# Patient Record
Sex: Female | Born: 1965 | Race: Black or African American | Hispanic: No | Marital: Married | Smoking: Former smoker
Health system: Southern US, Community
[De-identification: ages and names within clinical notes are randomized; demographics above are authoritative.]

## PROBLEM LIST (undated history)

## (undated) ENCOUNTER — Emergency Department (HOSPITAL_BASED_OUTPATIENT_CLINIC_OR_DEPARTMENT_OTHER): Admission: EM | Payer: Self-pay | Source: Home / Self Care

## (undated) ENCOUNTER — Emergency Department (HOSPITAL_BASED_OUTPATIENT_CLINIC_OR_DEPARTMENT_OTHER): Payer: Self-pay | Source: Home / Self Care

## (undated) DIAGNOSIS — R42 Dizziness and giddiness: Secondary | ICD-10-CM

## (undated) DIAGNOSIS — N200 Calculus of kidney: Secondary | ICD-10-CM

## (undated) DIAGNOSIS — E785 Hyperlipidemia, unspecified: Secondary | ICD-10-CM

## (undated) DIAGNOSIS — K219 Gastro-esophageal reflux disease without esophagitis: Secondary | ICD-10-CM

## (undated) DIAGNOSIS — J45909 Unspecified asthma, uncomplicated: Secondary | ICD-10-CM

## (undated) DIAGNOSIS — I10 Essential (primary) hypertension: Secondary | ICD-10-CM

## (undated) DIAGNOSIS — R918 Other nonspecific abnormal finding of lung field: Secondary | ICD-10-CM

## (undated) HISTORY — DX: Hyperlipidemia, unspecified: E78.5

## (undated) HISTORY — PX: TUBAL LIGATION: SHX77

## (undated) HISTORY — PX: MANDIBLE SURGERY: SHX707

---

## 1999-05-29 ENCOUNTER — Ambulatory Visit (HOSPITAL_COMMUNITY): Admission: RE | Admit: 1999-05-29 | Discharge: 1999-05-29 | Payer: Self-pay | Admitting: *Deleted

## 1999-05-29 ENCOUNTER — Encounter: Payer: Self-pay | Admitting: *Deleted

## 1999-08-06 ENCOUNTER — Emergency Department (HOSPITAL_COMMUNITY): Admission: EM | Admit: 1999-08-06 | Discharge: 1999-08-06 | Payer: Self-pay

## 1999-08-24 ENCOUNTER — Emergency Department (HOSPITAL_COMMUNITY): Admission: EM | Admit: 1999-08-24 | Discharge: 1999-08-25 | Payer: Self-pay | Admitting: Emergency Medicine

## 1999-10-20 ENCOUNTER — Encounter: Payer: Self-pay | Admitting: Emergency Medicine

## 1999-10-20 ENCOUNTER — Emergency Department (HOSPITAL_COMMUNITY): Admission: EM | Admit: 1999-10-20 | Discharge: 1999-10-20 | Payer: Self-pay | Admitting: Emergency Medicine

## 2000-04-10 ENCOUNTER — Emergency Department (HOSPITAL_COMMUNITY): Admission: EM | Admit: 2000-04-10 | Discharge: 2000-04-10 | Payer: Self-pay

## 2000-05-18 ENCOUNTER — Emergency Department (HOSPITAL_COMMUNITY): Admission: EM | Admit: 2000-05-18 | Discharge: 2000-05-18 | Payer: Self-pay | Admitting: Emergency Medicine

## 2000-11-07 ENCOUNTER — Emergency Department (HOSPITAL_COMMUNITY): Admission: EM | Admit: 2000-11-07 | Discharge: 2000-11-07 | Payer: Self-pay | Admitting: Internal Medicine

## 2000-11-26 ENCOUNTER — Other Ambulatory Visit (HOSPITAL_COMMUNITY): Admission: RE | Admit: 2000-11-26 | Discharge: 2000-12-03 | Payer: Self-pay | Admitting: Psychiatry

## 2001-02-14 ENCOUNTER — Emergency Department (HOSPITAL_COMMUNITY): Admission: EM | Admit: 2001-02-14 | Discharge: 2001-02-15 | Payer: Self-pay | Admitting: *Deleted

## 2001-02-15 ENCOUNTER — Encounter: Payer: Self-pay | Admitting: Emergency Medicine

## 2001-02-15 ENCOUNTER — Encounter: Payer: Self-pay | Admitting: *Deleted

## 2001-07-21 ENCOUNTER — Emergency Department (HOSPITAL_COMMUNITY): Admission: EM | Admit: 2001-07-21 | Discharge: 2001-07-21 | Payer: Self-pay | Admitting: Emergency Medicine

## 2001-07-21 ENCOUNTER — Encounter: Payer: Self-pay | Admitting: Emergency Medicine

## 2001-09-28 ENCOUNTER — Emergency Department (HOSPITAL_COMMUNITY): Admission: EM | Admit: 2001-09-28 | Discharge: 2001-09-28 | Payer: Self-pay | Admitting: Emergency Medicine

## 2001-09-28 ENCOUNTER — Encounter: Payer: Self-pay | Admitting: Emergency Medicine

## 2001-12-06 ENCOUNTER — Encounter: Payer: Self-pay | Admitting: Emergency Medicine

## 2001-12-06 ENCOUNTER — Emergency Department (HOSPITAL_COMMUNITY): Admission: EM | Admit: 2001-12-06 | Discharge: 2001-12-06 | Payer: Self-pay | Admitting: Emergency Medicine

## 2001-12-19 ENCOUNTER — Encounter: Payer: Self-pay | Admitting: Emergency Medicine

## 2001-12-19 ENCOUNTER — Emergency Department (HOSPITAL_COMMUNITY): Admission: EM | Admit: 2001-12-19 | Discharge: 2001-12-19 | Payer: Self-pay | Admitting: Emergency Medicine

## 2001-12-24 ENCOUNTER — Encounter: Admission: RE | Admit: 2001-12-24 | Discharge: 2001-12-24 | Payer: Self-pay | Admitting: Family Medicine

## 2001-12-24 ENCOUNTER — Encounter: Payer: Self-pay | Admitting: Family Medicine

## 2001-12-27 ENCOUNTER — Encounter: Payer: Self-pay | Admitting: Family Medicine

## 2001-12-27 ENCOUNTER — Encounter: Admission: RE | Admit: 2001-12-27 | Discharge: 2001-12-27 | Payer: Self-pay | Admitting: Family Medicine

## 2002-02-10 ENCOUNTER — Emergency Department (HOSPITAL_COMMUNITY): Admission: EM | Admit: 2002-02-10 | Discharge: 2002-02-11 | Payer: Self-pay | Admitting: Emergency Medicine

## 2002-02-10 ENCOUNTER — Encounter: Payer: Self-pay | Admitting: Emergency Medicine

## 2002-02-24 ENCOUNTER — Other Ambulatory Visit: Admission: RE | Admit: 2002-02-24 | Discharge: 2002-02-24 | Payer: Self-pay | Admitting: Family Medicine

## 2002-06-11 ENCOUNTER — Encounter: Admission: RE | Admit: 2002-06-11 | Discharge: 2002-06-11 | Payer: Self-pay | Admitting: Family Medicine

## 2002-06-11 ENCOUNTER — Encounter: Payer: Self-pay | Admitting: Family Medicine

## 2002-12-13 ENCOUNTER — Emergency Department (HOSPITAL_COMMUNITY): Admission: EM | Admit: 2002-12-13 | Discharge: 2002-12-13 | Payer: Self-pay | Admitting: Emergency Medicine

## 2002-12-27 ENCOUNTER — Emergency Department (HOSPITAL_COMMUNITY): Admission: EM | Admit: 2002-12-27 | Discharge: 2002-12-27 | Payer: Self-pay | Admitting: Emergency Medicine

## 2003-01-29 ENCOUNTER — Emergency Department (HOSPITAL_COMMUNITY): Admission: EM | Admit: 2003-01-29 | Discharge: 2003-01-29 | Payer: Self-pay | Admitting: Emergency Medicine

## 2003-01-29 ENCOUNTER — Encounter: Payer: Self-pay | Admitting: Emergency Medicine

## 2003-02-08 ENCOUNTER — Emergency Department (HOSPITAL_COMMUNITY): Admission: EM | Admit: 2003-02-08 | Discharge: 2003-02-08 | Payer: Self-pay | Admitting: Emergency Medicine

## 2003-02-17 ENCOUNTER — Emergency Department (HOSPITAL_COMMUNITY): Admission: EM | Admit: 2003-02-17 | Discharge: 2003-02-17 | Payer: Self-pay | Admitting: Emergency Medicine

## 2003-03-03 ENCOUNTER — Encounter: Admission: RE | Admit: 2003-03-03 | Discharge: 2003-03-31 | Payer: Self-pay

## 2003-03-09 ENCOUNTER — Encounter: Payer: Self-pay | Admitting: Emergency Medicine

## 2003-03-09 ENCOUNTER — Emergency Department (HOSPITAL_COMMUNITY): Admission: EM | Admit: 2003-03-09 | Discharge: 2003-03-09 | Payer: Self-pay | Admitting: Emergency Medicine

## 2003-03-11 ENCOUNTER — Encounter: Payer: Self-pay | Admitting: Emergency Medicine

## 2003-03-11 ENCOUNTER — Inpatient Hospital Stay (HOSPITAL_COMMUNITY): Admission: EM | Admit: 2003-03-11 | Discharge: 2003-03-14 | Payer: Self-pay | Admitting: Emergency Medicine

## 2003-03-12 ENCOUNTER — Encounter: Payer: Self-pay | Admitting: Family Medicine

## 2003-03-14 ENCOUNTER — Inpatient Hospital Stay (HOSPITAL_COMMUNITY): Admission: AD | Admit: 2003-03-14 | Discharge: 2003-03-16 | Payer: Self-pay

## 2003-03-16 ENCOUNTER — Encounter: Payer: Self-pay | Admitting: Family Medicine

## 2003-04-07 ENCOUNTER — Ambulatory Visit (HOSPITAL_BASED_OUTPATIENT_CLINIC_OR_DEPARTMENT_OTHER): Admission: RE | Admit: 2003-04-07 | Discharge: 2003-04-07 | Payer: Self-pay | Admitting: Urology

## 2003-04-09 ENCOUNTER — Emergency Department (HOSPITAL_COMMUNITY): Admission: EM | Admit: 2003-04-09 | Discharge: 2003-04-09 | Payer: Self-pay | Admitting: Emergency Medicine

## 2003-04-09 ENCOUNTER — Encounter: Payer: Self-pay | Admitting: Emergency Medicine

## 2003-04-13 ENCOUNTER — Other Ambulatory Visit: Admission: RE | Admit: 2003-04-13 | Discharge: 2003-04-13 | Payer: Self-pay | Admitting: Obstetrics and Gynecology

## 2003-05-03 ENCOUNTER — Emergency Department (HOSPITAL_COMMUNITY): Admission: EM | Admit: 2003-05-03 | Discharge: 2003-05-03 | Payer: Self-pay | Admitting: Emergency Medicine

## 2003-05-14 ENCOUNTER — Encounter: Payer: Self-pay | Admitting: Emergency Medicine

## 2003-05-14 ENCOUNTER — Emergency Department (HOSPITAL_COMMUNITY): Admission: EM | Admit: 2003-05-14 | Discharge: 2003-05-15 | Payer: Self-pay | Admitting: Emergency Medicine

## 2003-05-18 ENCOUNTER — Ambulatory Visit (HOSPITAL_COMMUNITY): Admission: RE | Admit: 2003-05-18 | Discharge: 2003-05-18 | Payer: Self-pay | Admitting: Gastroenterology

## 2003-06-11 ENCOUNTER — Emergency Department (HOSPITAL_COMMUNITY): Admission: EM | Admit: 2003-06-11 | Discharge: 2003-06-11 | Payer: Self-pay | Admitting: Emergency Medicine

## 2003-06-13 ENCOUNTER — Emergency Department (HOSPITAL_COMMUNITY): Admission: EM | Admit: 2003-06-13 | Discharge: 2003-06-14 | Payer: Self-pay | Admitting: Emergency Medicine

## 2003-07-20 ENCOUNTER — Emergency Department (HOSPITAL_COMMUNITY): Admission: EM | Admit: 2003-07-20 | Discharge: 2003-07-21 | Payer: Self-pay | Admitting: Emergency Medicine

## 2003-08-05 ENCOUNTER — Ambulatory Visit (HOSPITAL_COMMUNITY): Admission: RE | Admit: 2003-08-05 | Discharge: 2003-08-05 | Payer: Self-pay | Admitting: Orthopaedic Surgery

## 2003-08-10 ENCOUNTER — Emergency Department (HOSPITAL_COMMUNITY): Admission: EM | Admit: 2003-08-10 | Discharge: 2003-08-10 | Payer: Self-pay | Admitting: Emergency Medicine

## 2003-08-25 ENCOUNTER — Emergency Department (HOSPITAL_COMMUNITY): Admission: EM | Admit: 2003-08-25 | Discharge: 2003-08-25 | Payer: Self-pay | Admitting: Emergency Medicine

## 2003-08-28 ENCOUNTER — Inpatient Hospital Stay (HOSPITAL_COMMUNITY): Admission: AD | Admit: 2003-08-28 | Discharge: 2003-08-31 | Payer: Self-pay | Admitting: Neurology

## 2003-12-14 ENCOUNTER — Other Ambulatory Visit: Admission: RE | Admit: 2003-12-14 | Discharge: 2003-12-14 | Payer: Self-pay | Admitting: Family Medicine

## 2003-12-22 ENCOUNTER — Encounter: Admission: RE | Admit: 2003-12-22 | Discharge: 2003-12-22 | Payer: Self-pay | Admitting: Family Medicine

## 2003-12-28 ENCOUNTER — Other Ambulatory Visit: Admission: RE | Admit: 2003-12-28 | Discharge: 2003-12-28 | Payer: Self-pay | Admitting: Obstetrics and Gynecology

## 2004-02-27 ENCOUNTER — Emergency Department (HOSPITAL_COMMUNITY): Admission: EM | Admit: 2004-02-27 | Discharge: 2004-02-27 | Payer: Self-pay | Admitting: Emergency Medicine

## 2004-04-20 ENCOUNTER — Emergency Department (HOSPITAL_COMMUNITY): Admission: EM | Admit: 2004-04-20 | Discharge: 2004-04-20 | Payer: Self-pay | Admitting: Emergency Medicine

## 2004-04-25 ENCOUNTER — Ambulatory Visit (HOSPITAL_COMMUNITY): Admission: RE | Admit: 2004-04-25 | Discharge: 2004-04-25 | Payer: Self-pay | Admitting: Orthopaedic Surgery

## 2004-04-27 ENCOUNTER — Encounter: Admission: RE | Admit: 2004-04-27 | Discharge: 2004-07-26 | Payer: Self-pay | Admitting: Orthopaedic Surgery

## 2004-05-09 ENCOUNTER — Ambulatory Visit (HOSPITAL_COMMUNITY): Admission: RE | Admit: 2004-05-09 | Discharge: 2004-05-09 | Payer: Self-pay | Admitting: Obstetrics and Gynecology

## 2004-05-09 ENCOUNTER — Encounter (INDEPENDENT_AMBULATORY_CARE_PROVIDER_SITE_OTHER): Payer: Self-pay | Admitting: Specialist

## 2004-06-10 ENCOUNTER — Emergency Department (HOSPITAL_COMMUNITY): Admission: EM | Admit: 2004-06-10 | Discharge: 2004-06-10 | Payer: Self-pay | Admitting: Emergency Medicine

## 2004-07-27 ENCOUNTER — Emergency Department (HOSPITAL_COMMUNITY): Admission: EM | Admit: 2004-07-27 | Discharge: 2004-07-27 | Payer: Self-pay | Admitting: Emergency Medicine

## 2004-08-30 ENCOUNTER — Other Ambulatory Visit: Admission: RE | Admit: 2004-08-30 | Discharge: 2004-08-30 | Payer: Self-pay | Admitting: Obstetrics and Gynecology

## 2004-09-07 ENCOUNTER — Emergency Department (HOSPITAL_COMMUNITY): Admission: EM | Admit: 2004-09-07 | Discharge: 2004-09-08 | Payer: Self-pay | Admitting: Emergency Medicine

## 2004-10-07 ENCOUNTER — Emergency Department (HOSPITAL_COMMUNITY): Admission: EM | Admit: 2004-10-07 | Discharge: 2004-10-07 | Payer: Self-pay | Admitting: Emergency Medicine

## 2004-11-23 ENCOUNTER — Emergency Department (HOSPITAL_COMMUNITY): Admission: EM | Admit: 2004-11-23 | Discharge: 2004-11-23 | Payer: Self-pay | Admitting: Emergency Medicine

## 2005-01-21 ENCOUNTER — Emergency Department (HOSPITAL_COMMUNITY): Admission: EM | Admit: 2005-01-21 | Discharge: 2005-01-21 | Payer: Self-pay | Admitting: Emergency Medicine

## 2005-02-24 ENCOUNTER — Emergency Department (HOSPITAL_COMMUNITY): Admission: EM | Admit: 2005-02-24 | Discharge: 2005-02-25 | Payer: Self-pay | Admitting: Emergency Medicine

## 2005-05-07 ENCOUNTER — Emergency Department (HOSPITAL_COMMUNITY): Admission: EM | Admit: 2005-05-07 | Discharge: 2005-05-08 | Payer: Self-pay | Admitting: Emergency Medicine

## 2005-12-20 ENCOUNTER — Emergency Department (HOSPITAL_COMMUNITY): Admission: EM | Admit: 2005-12-20 | Discharge: 2005-12-20 | Payer: Self-pay | Admitting: Emergency Medicine

## 2006-02-26 ENCOUNTER — Emergency Department (HOSPITAL_COMMUNITY): Admission: EM | Admit: 2006-02-26 | Discharge: 2006-02-26 | Payer: Self-pay | Admitting: Emergency Medicine

## 2006-07-03 ENCOUNTER — Emergency Department (HOSPITAL_COMMUNITY): Admission: EM | Admit: 2006-07-03 | Discharge: 2006-07-04 | Payer: Self-pay | Admitting: Emergency Medicine

## 2006-08-27 ENCOUNTER — Emergency Department (HOSPITAL_COMMUNITY): Admission: EM | Admit: 2006-08-27 | Discharge: 2006-08-27 | Payer: Self-pay | Admitting: Emergency Medicine

## 2006-08-30 ENCOUNTER — Emergency Department (HOSPITAL_COMMUNITY): Admission: EM | Admit: 2006-08-30 | Discharge: 2006-08-30 | Payer: Self-pay | Admitting: Emergency Medicine

## 2006-10-09 ENCOUNTER — Emergency Department (HOSPITAL_COMMUNITY): Admission: EM | Admit: 2006-10-09 | Discharge: 2006-10-09 | Payer: Self-pay | Admitting: Emergency Medicine

## 2006-11-22 ENCOUNTER — Ambulatory Visit (HOSPITAL_COMMUNITY): Admission: RE | Admit: 2006-11-22 | Discharge: 2006-11-22 | Payer: Self-pay | Admitting: Gastroenterology

## 2007-01-06 ENCOUNTER — Emergency Department (HOSPITAL_COMMUNITY): Admission: EM | Admit: 2007-01-06 | Discharge: 2007-01-06 | Payer: Self-pay | Admitting: Emergency Medicine

## 2007-02-11 ENCOUNTER — Emergency Department (HOSPITAL_COMMUNITY): Admission: EM | Admit: 2007-02-11 | Discharge: 2007-02-11 | Payer: Self-pay | Admitting: Emergency Medicine

## 2007-04-03 ENCOUNTER — Emergency Department (HOSPITAL_COMMUNITY): Admission: EM | Admit: 2007-04-03 | Discharge: 2007-04-03 | Payer: Self-pay | Admitting: Emergency Medicine

## 2007-05-08 ENCOUNTER — Emergency Department (HOSPITAL_COMMUNITY): Admission: EM | Admit: 2007-05-08 | Discharge: 2007-05-08 | Payer: Self-pay | Admitting: Emergency Medicine

## 2007-12-08 ENCOUNTER — Encounter: Payer: Self-pay | Admitting: Emergency Medicine

## 2007-12-09 ENCOUNTER — Inpatient Hospital Stay (HOSPITAL_COMMUNITY): Admission: EM | Admit: 2007-12-09 | Discharge: 2007-12-10 | Payer: Self-pay | Admitting: Neurology

## 2007-12-09 ENCOUNTER — Encounter (INDEPENDENT_AMBULATORY_CARE_PROVIDER_SITE_OTHER): Payer: Self-pay | Admitting: Pediatrics

## 2008-02-27 ENCOUNTER — Emergency Department (HOSPITAL_COMMUNITY): Admission: EM | Admit: 2008-02-27 | Discharge: 2008-02-27 | Payer: Self-pay | Admitting: *Deleted

## 2008-05-28 ENCOUNTER — Emergency Department (HOSPITAL_COMMUNITY): Admission: EM | Admit: 2008-05-28 | Discharge: 2008-05-28 | Payer: Self-pay | Admitting: Emergency Medicine

## 2008-09-01 ENCOUNTER — Emergency Department (HOSPITAL_COMMUNITY): Admission: EM | Admit: 2008-09-01 | Discharge: 2008-09-01 | Payer: Self-pay | Admitting: Emergency Medicine

## 2008-09-24 ENCOUNTER — Emergency Department (HOSPITAL_COMMUNITY): Admission: EM | Admit: 2008-09-24 | Discharge: 2008-09-25 | Payer: Self-pay | Admitting: Emergency Medicine

## 2008-10-14 IMAGING — CR DG CHEST 2V
2 series · 2 of 2 positions shown · non-contrast
Comparison: CT chest and chest x-ray 12/20/05.

CLINICAL DATA: Shortness of breath and chest pain.  
 CHEST ? 2 VIEW ? 08/27/06:

[view not recorded (1 of 2)]
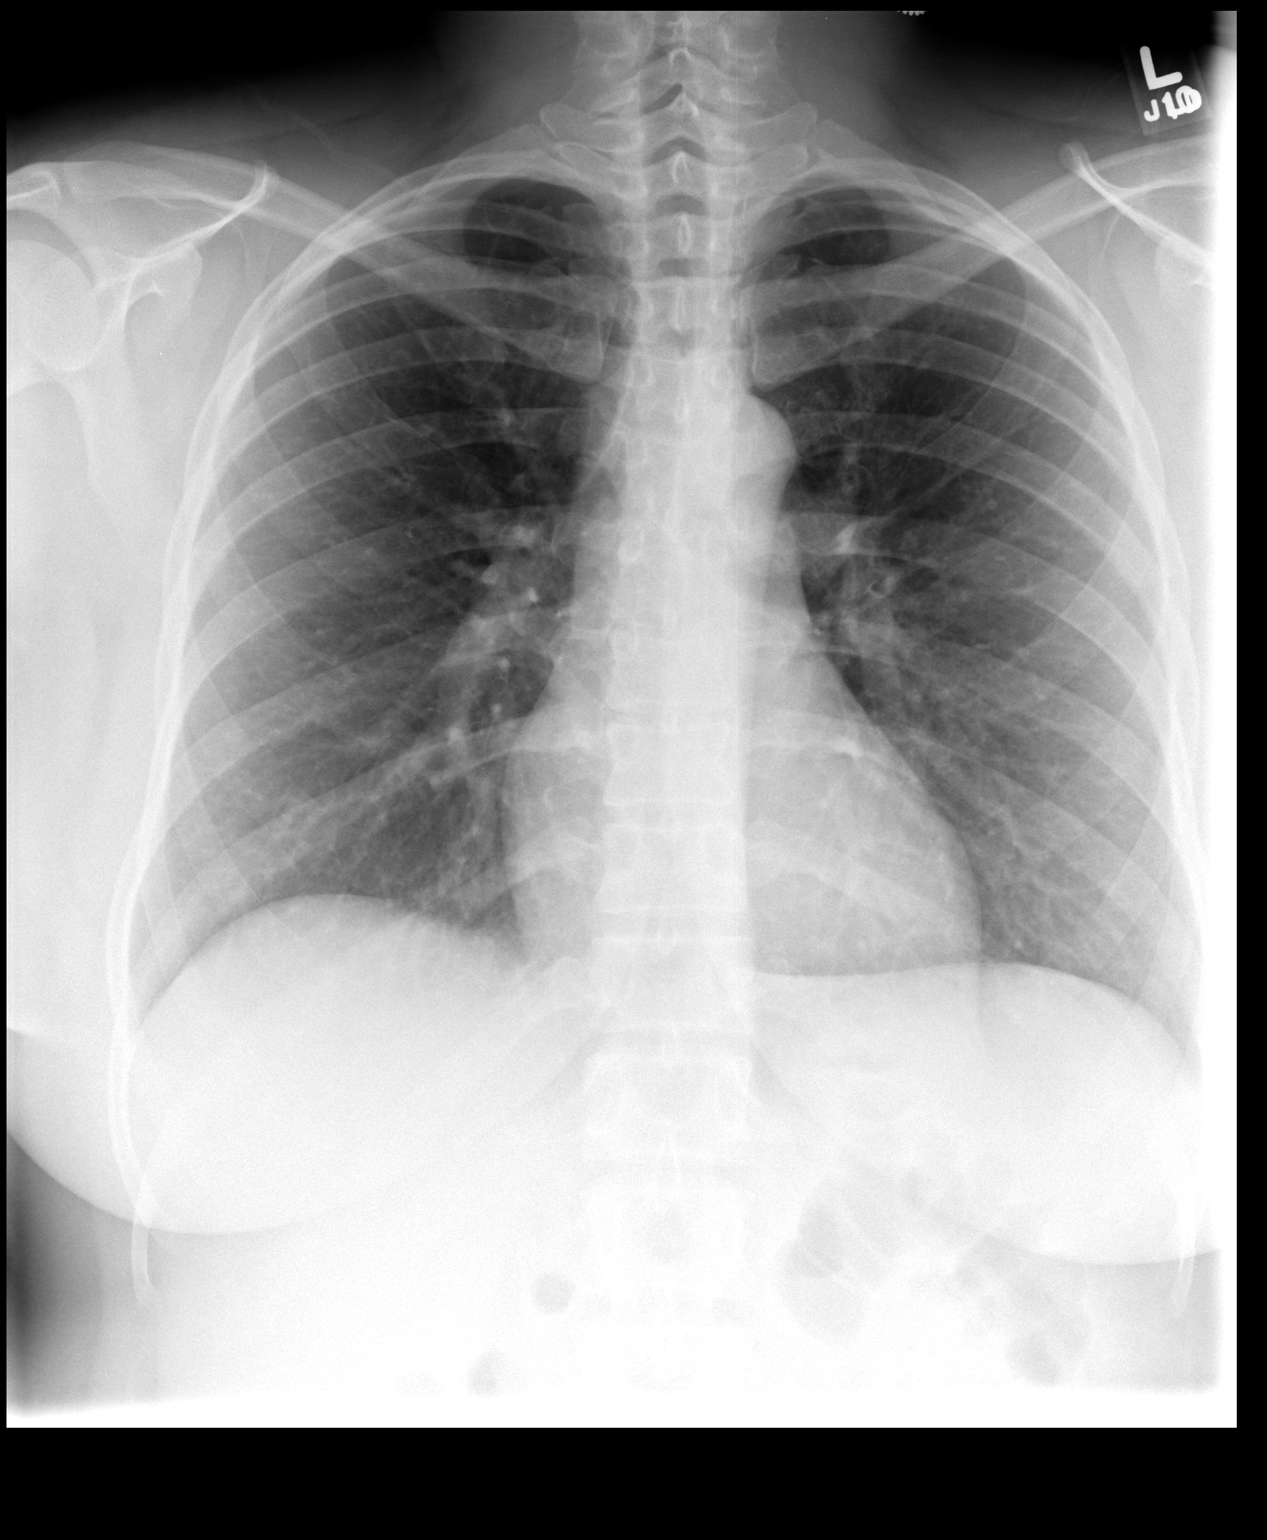

[view not recorded (2 of 2)]
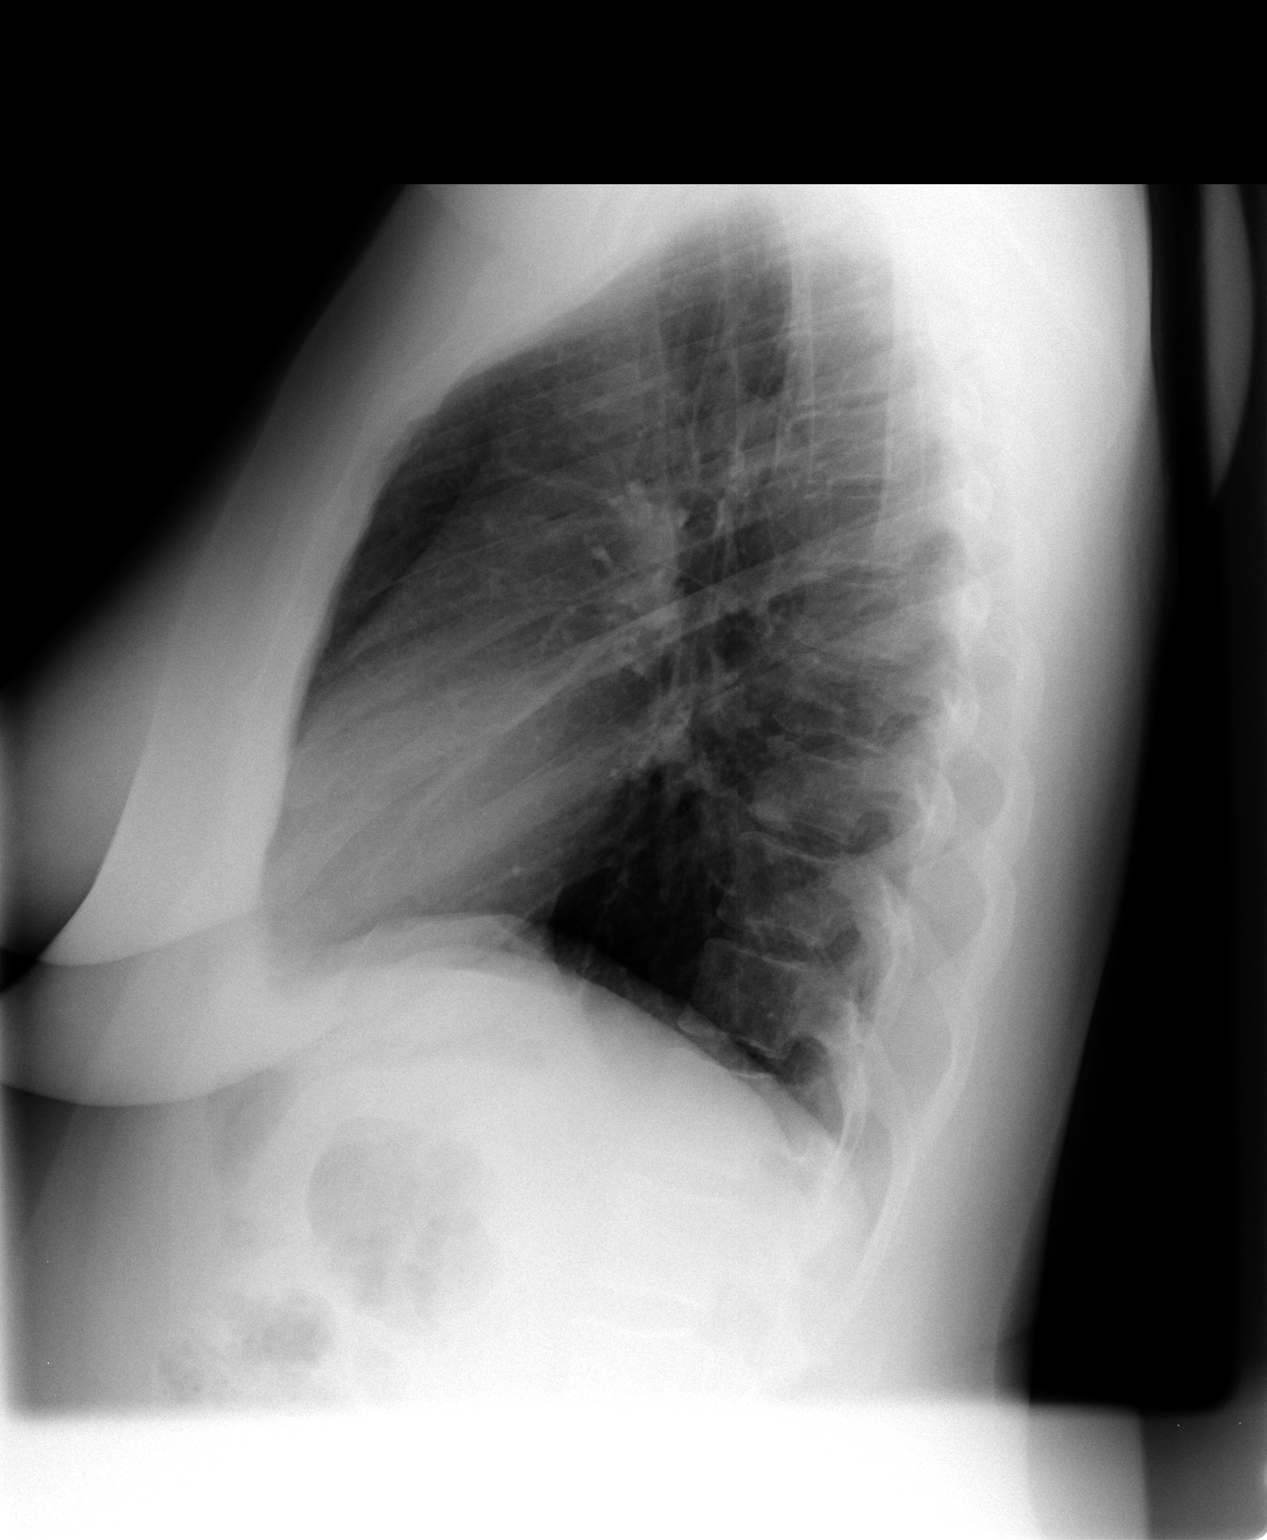

[2 of 2 positions shown; findings below may reference images not displayed]

FINDINGS: Trachea is midline.  Heart size is normal.  Lungs are clear.  No pleural fluid.
IMPRESSION: No acute findings.

## 2008-12-25 ENCOUNTER — Emergency Department (HOSPITAL_COMMUNITY): Admission: EM | Admit: 2008-12-25 | Discharge: 2008-12-25 | Payer: Self-pay | Admitting: *Deleted

## 2009-05-21 IMAGING — CR DG HIP COMPLETE 2+V*R*
3 series · 3 of 3 positions shown · non-contrast
Comparison: none

CLINICAL DATA: Fell one week ago. Soreness within the right hip.
 RIGHT HIP ? 3 VIEW:

[t pelvis a.p.]
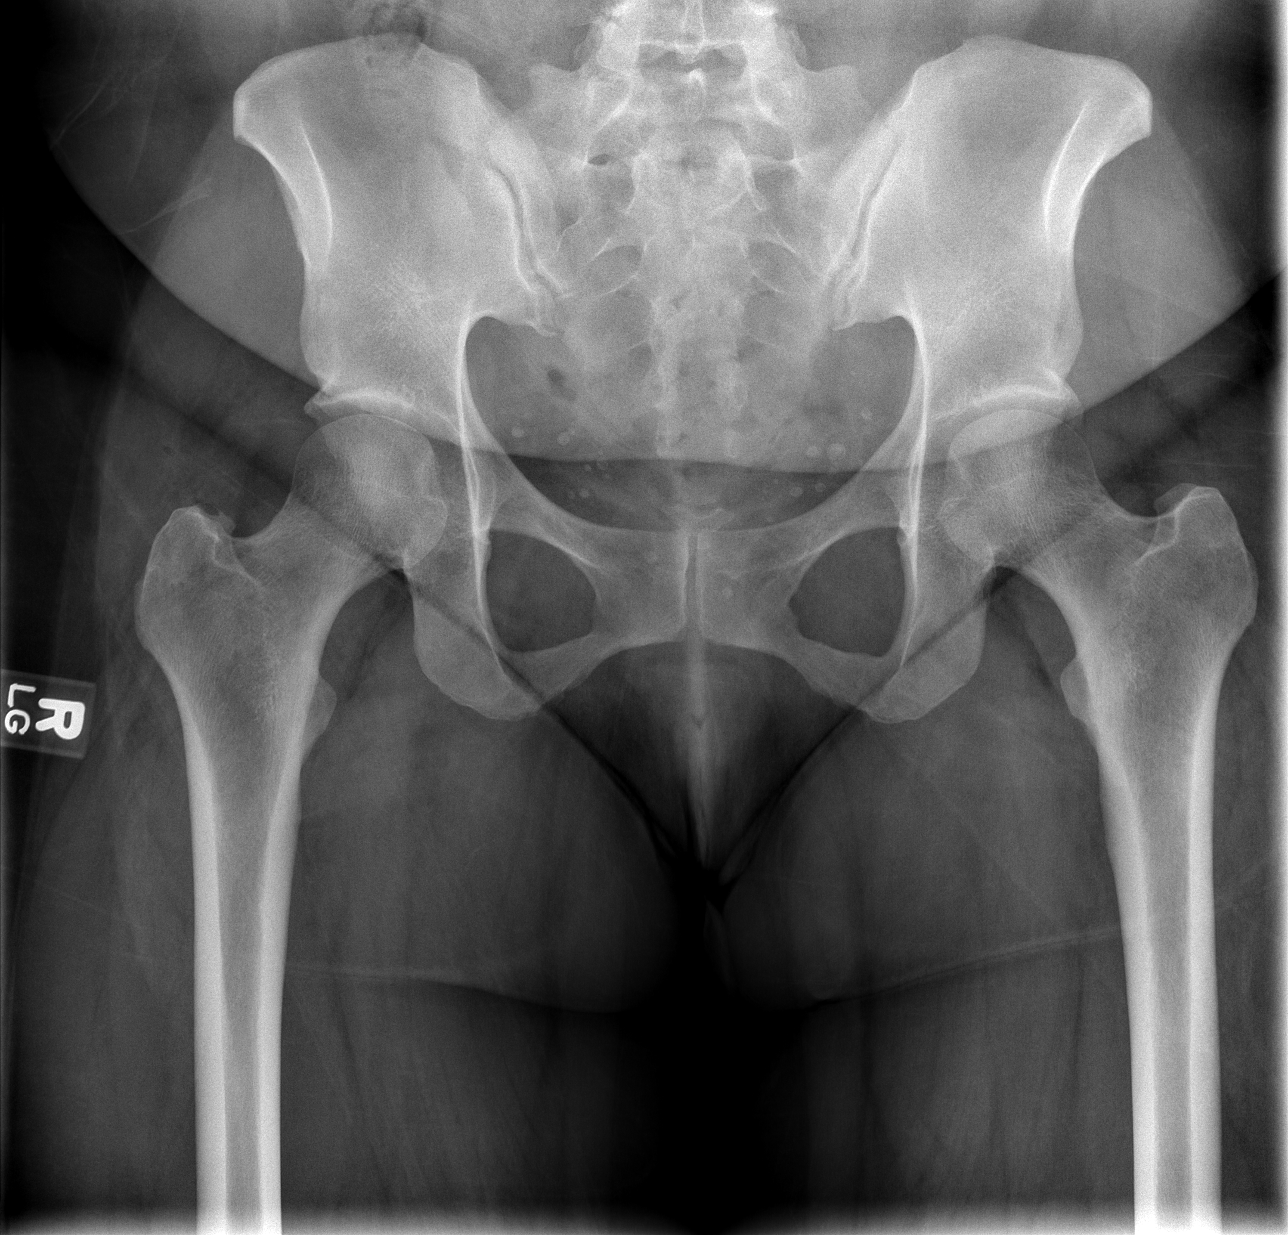

[t hip ap right]
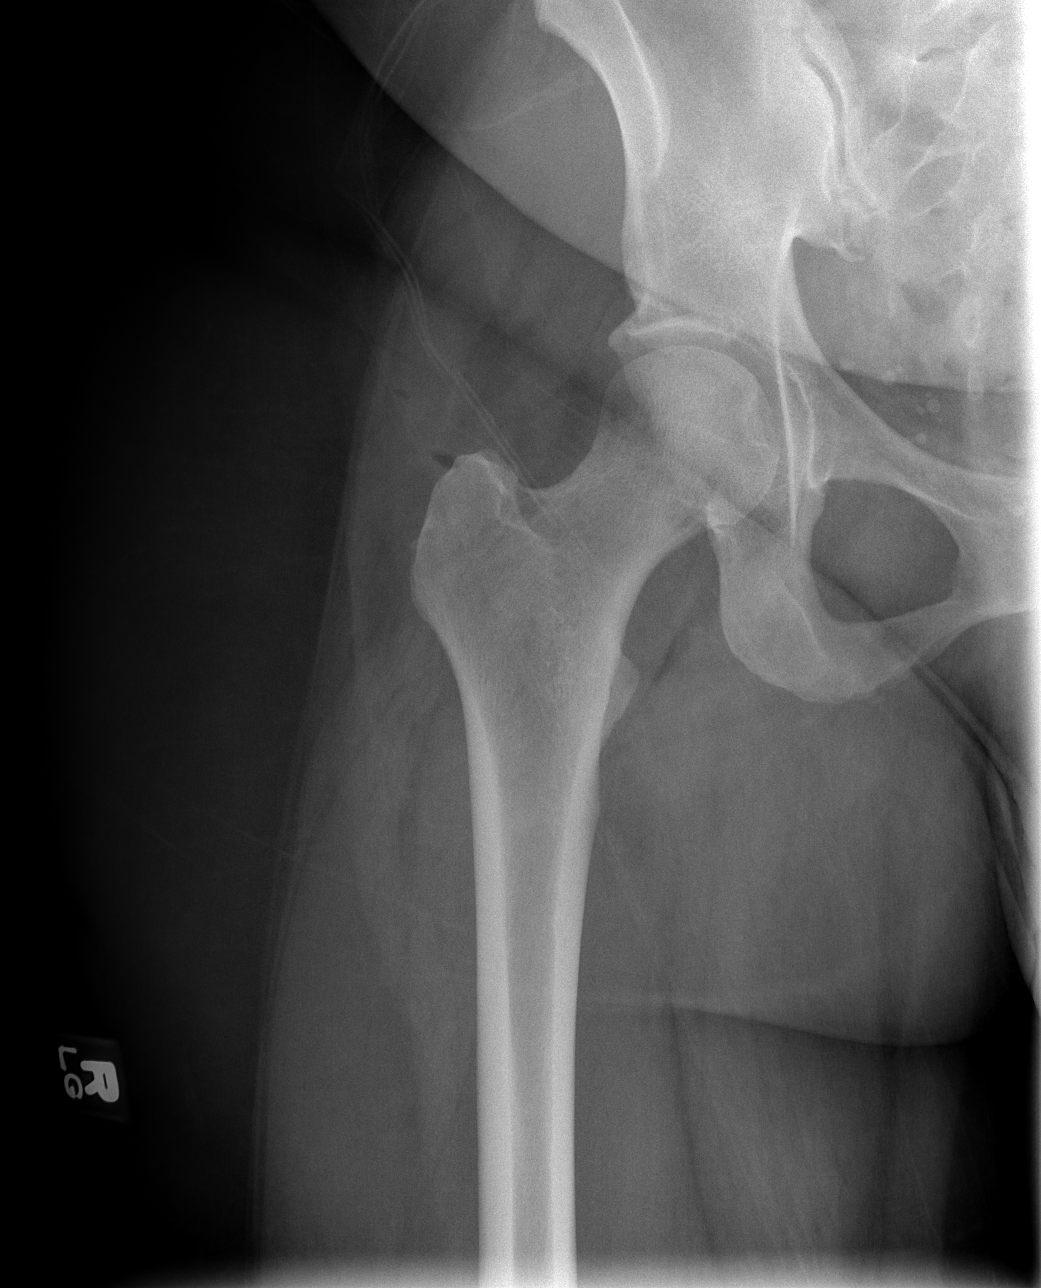

[t hip frog leg right]
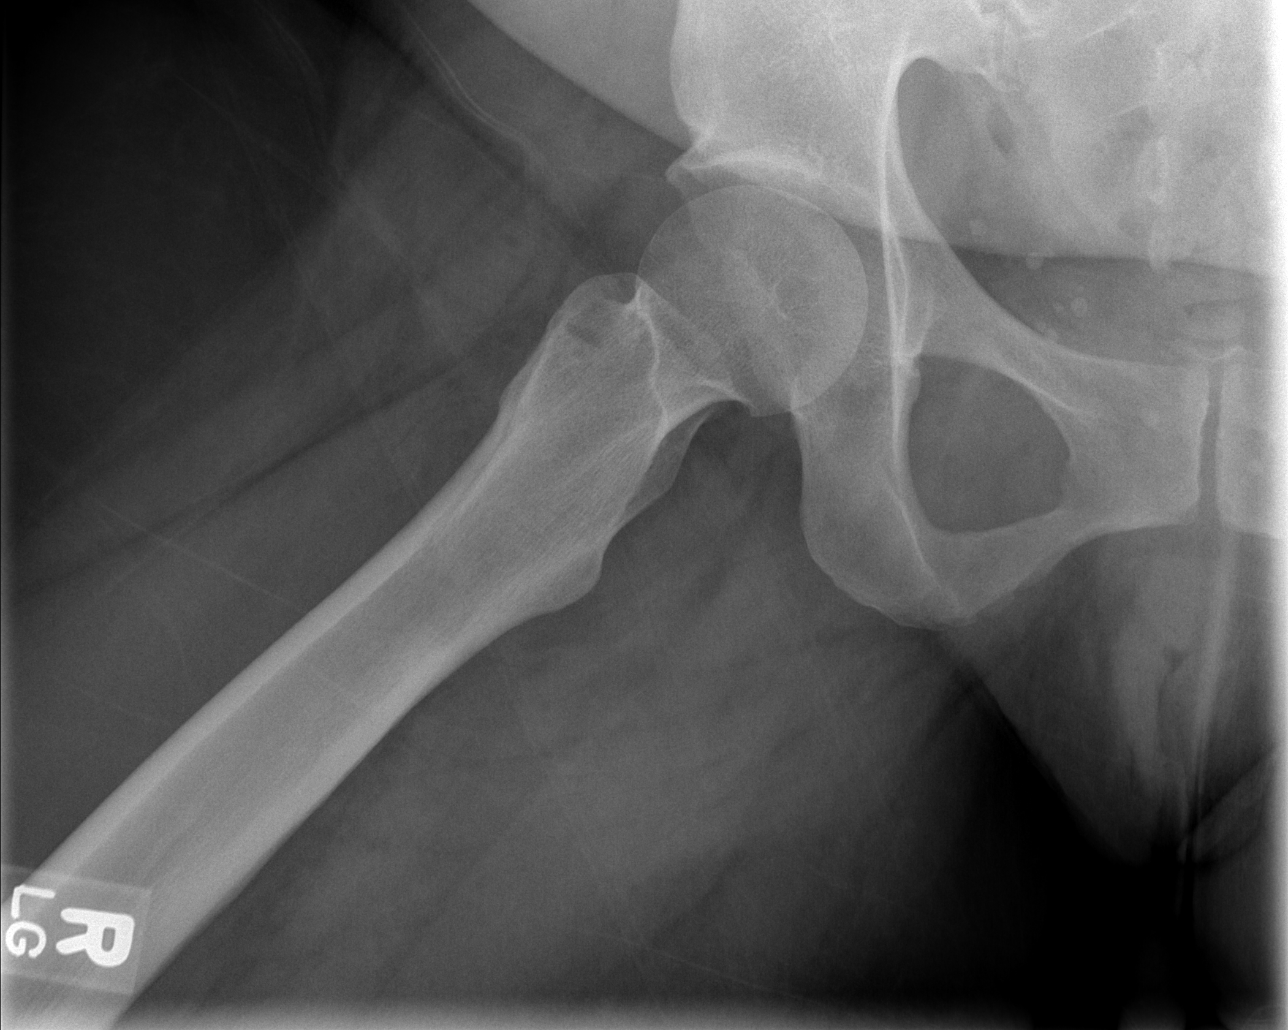

[3 of 3 positions shown; findings below may reference images not displayed]

FINDINGS: No evidence of fracture, degenerative change, or other focal osseous lesion.
IMPRESSION: Negative.

## 2009-07-01 ENCOUNTER — Emergency Department (HOSPITAL_COMMUNITY): Admission: EM | Admit: 2009-07-01 | Discharge: 2009-07-01 | Payer: Self-pay | Admitting: Emergency Medicine

## 2009-08-16 ENCOUNTER — Emergency Department (HOSPITAL_COMMUNITY): Admission: EM | Admit: 2009-08-16 | Discharge: 2009-08-16 | Payer: Self-pay | Admitting: Emergency Medicine

## 2009-08-30 ENCOUNTER — Encounter: Admission: RE | Admit: 2009-08-30 | Discharge: 2009-08-30 | Payer: Self-pay | Admitting: Neurological Surgery

## 2010-02-18 ENCOUNTER — Emergency Department (HOSPITAL_COMMUNITY): Admission: EM | Admit: 2010-02-18 | Discharge: 2010-02-18 | Payer: Self-pay | Admitting: Emergency Medicine

## 2010-07-20 ENCOUNTER — Emergency Department (HOSPITAL_COMMUNITY)
Admission: EM | Admit: 2010-07-20 | Discharge: 2010-07-21 | Payer: Self-pay | Source: Home / Self Care | Admitting: Emergency Medicine

## 2010-10-03 LAB — POCT CARDIAC MARKERS
CKMB, poc: <1 ng/mL — ABNORMAL LOW (ref 1.0–8.0)
Myoglobin, poc: 51.7 ng/mL (ref 12–200)
Troponin i, poc: <0.05 ng/mL (ref 0.00–0.09)

## 2010-10-03 LAB — CBC
Hemoglobin: 12.5 g/dL (ref 12.0–15.0)
MCH: 27.3 pg (ref 26.0–34.0)
RBC: 4.58 MIL/uL (ref 3.87–5.11)

## 2010-10-03 LAB — BASIC METABOLIC PANEL
CO2: 24 mEq/L (ref 19–32)
Calcium: 9 mg/dL (ref 8.4–10.5)
Chloride: 111 mEq/L (ref 96–112)
GFR calc Af Amer: >60 mL/min (ref >60)
Sodium: 142 mEq/L (ref 135–145)

## 2010-10-03 LAB — D-DIMER, QUANTITATIVE: D-Dimer, Quant: 0.34 ug/mL-FEU (ref 0.00–0.48)

## 2010-10-08 LAB — BASIC METABOLIC PANEL
BUN: 10 mg/dL (ref 6–23)
Calcium: 9.3 mg/dL (ref 8.4–10.5)
Creatinine, Ser: 0.92 mg/dL (ref 0.4–1.2)
GFR calc non Af Amer: >60 mL/min (ref >60)
Potassium: 3.5 mEq/L (ref 3.5–5.1)

## 2010-10-08 LAB — CBC
Platelets: 279 10*3/uL (ref 150–400)
RDW: 13.6 % (ref 11.5–15.5)
WBC: 4.3 10*3/uL (ref 4.0–10.5)

## 2010-11-08 LAB — URINALYSIS, ROUTINE W REFLEX MICROSCOPIC
Nitrite: NEGATIVE
Protein, ur: NEGATIVE mg/dL
Specific Gravity, Urine: 1.016 (ref 1.005–1.030)
Urobilinogen, UA: 1 mg/dL (ref 0.0–1.0)

## 2010-11-08 LAB — POCT I-STAT, CHEM 8
HCT: 40 % (ref 36.0–46.0)
Hemoglobin: 13.6 g/dL (ref 12.0–15.0)
Potassium: 3.9 mEq/L (ref 3.5–5.1)
Sodium: 145 mEq/L (ref 135–145)
TCO2: 19 mmol/L (ref 0–100)

## 2010-11-08 LAB — URINE MICROSCOPIC-ADD ON

## 2010-11-08 LAB — POCT PREGNANCY, URINE: Preg Test, Ur: NEGATIVE

## 2010-12-06 NOTE — Assessment & Plan Note (Signed)
[facility]   NAME:  Monique Tyler Tyler, Monique Tyler Tyler               ACCOUNT NO.:  [id number]   MEDICAL RECORD NO.:  [id number]      DATE OF BIRTH:  03/18/1966   PHYSICIAN:  Pramod P. Pearlean Brownie, MD          VISIT DATE:                                   OFFICE VISIT   DIAGNOSES AT Monique Tyler TIME OF DISCHARGE:  1. Transient right vision loss with eye pain, question etiology, doubt      transient ischemic attack, question related to anxiety.  2. Cigarette smoker.  3. Hypertension.  4. Headache.  5. Panic attacks.  6. Depression.  7. Gastroesophageal reflux disease.  8. Reports vaginal bleeding every 2 weeks, though not currently      bleeding.   MEDICINES AT Monique Tyler TIME OF DISCHARGE:  1. Topamax 50 mg b.i.d.  2. Aspirin 81 mg a Tyler.   STUDIES PERFORMED:  1. CT of Monique Tyler brain on Tyler shows no acute abnormality.  Small air-      fluid level within Monique Tyler right maxillary sinus.  2. MRI of Monique Tyler brain shows no acute findings.  3. MRA of Monique Tyler brain is negative.  4. CT of Monique Tyler chest shows interval development of bilateral      pathologically enlarged lymph nodes.  5. CT of Monique Tyler abdomen shows no acute abdominal findings, no mass, or      adenopathy.  6. CT of Monique Tyler pelvis shows no acute findings, no mass, or adenopathy.  7. Carotid Doppler shows no internal carotid artery stenosis.  8. A 2-D echocardiogram showed ejection fraction of 65% with no      obvious source of embolus.  9. Transcranial Doppler performed, results pending.  10.EKG shows sinus bradycardia, otherwise normal.   LABORATORY STUDIES:  CBC normal.  Chemistry with potassium 3.4, chloride  118, glucose 101, otherwise normal.  Coagulation studies with PT 15.5,  INR 1.2, and PTT 31.  Liver function tests normal, albumin 2.8.  Cardiac  enzymes negative x2.  Cholesterol 152, triglycerides 128, HDL 23, and  LDL 103.  Urinalysis with 3-6 white blood cells, otherwise normal.  Hemoglobin A1c 4.6.  Homocystine 10.0.  Urine drug screen  negative.  Urine pregnancy negative.   HISTORY OF PRESENTING ILLNESS:  Monique Tyler Tyler is a 45 year old right-  handed African American divorced Tyler with a long-standing history of  migraine, more commonly without than with aura.  Monique Tyler Tyler has been on  Topamax for years with fair control.  Monique Tyler Tyler at  around 10 p.m., she had an onset of a shade-like occlusion of Monique Tyler vision  in her right eye, which lasted for a few seconds.  This was associated  with an uneasy feeling in her eyes and she felt there is irritation like  and allergic reaction.  She also felt that her right lid was droopy.  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  At night,  she had onset of loss of vision, again shade-like occlusion, this time  lasting about a minute.  It was associated with a holosystolic headache  that she says was migrainous in nature, associated with nausea and  achiness in her right eye, right greater than left.  She has had worse  migraines.  She has never had a vision problem like this until Monique Tyler Tyler  prior.  Of note, Monique Tyler Tyler told Dr. Sharene Skeans that she has lost about  38 pounds in Monique Tyler last 4 months and has had fairly consistent abdominal  discomfort and nausea.  She has followed up primary care.  She was  admitted to [facility] hospital for further stroke evaluation.  She was not a  TPA candidate as Monique Tyler time of onset of symptoms was greater than 3 hours.   HOSPITAL COURSE:  MRI was negative for acute stroke.  Unlikely to be  TIA, as Monique Tyler transient vision loss associated with eye pain is typically  not felt to be brain related.  She also had a CT abdomen and pelvis to  rule out obvious mass secondary to weight loss.  Basically negative,  even though she had some slightly enlarging lymph nodes in her chest  that would need followup.  She has vascular risk factors of cigarette  smoking, hypertension, and migraine headaches.  She was placed on  aspirin for  stroke prevention, even though she reports vaginal bleeding  approximately every 2 weeks.  She is currently not bleeding and has been  asked to follow up with her GYN.  Her stroke workup was negative for  acute stroke.  It really is not felt that she has had stroke.  She has  been asked to follow up with an ophthalmologist related to her eye,  gynecologist related to Monique Tyler bleeding, and neurologist as needed.   CONDITION AT DISCHARGE:  Monique Tyler Tyler alert and oriented x3, no aphasia,  no dysarthria.  Her eye movements are full.  Her face is symmetric.  She  has no focal weakness.   DISCHARGE PLAN:  1. Discharge home with family.  2. Follow up with ophthalmologist.  Has been referred to Dr. Raelyn Number.  3. Follow up with gynecologist for vaginal bleeding as needed.  4. Follow up with neurologist as needed.  5. Aspirin for stroke prevention.      [name], N.P.    ______________________________  Sunny Schlein. Pearlean Brownie, MD    SB/MEDQ  D:  12/10/2007  T:  12/11/2007  Job:  [id number]   cc:   Pramod P. Pearlean Brownie, MD  Sharion Balloon. Pearlean Brownie, M.D.  Elsworth Soho, M.D.  Dr. Malena Catholic

## 2010-12-06 NOTE — H&P (Signed)
NAMELAFONDA, PATRON               ACCOUNT NO.:  [id number]   MEDICAL RECORD NO.:  [id number]          PATIENT TYPE:  EMS   LOCATION:  ED                           FACILITY:  [facility]   PHYSICIAN:  Deanna Artis. Hickling, M.D.DATE OF BIRTH:  Oct 20, 1965   DATE OF ADMISSION:  12/08/2007  DATE OF DISCHARGE:                              HISTORY & PHYSICAL   CHIEF COMPLAINT:  Visual loss x2 right eye in 24 hours and also  migrainous headaches.   HISTORY OF PRESENT ILLNESS:  A 45 year old right-handed Afro-American  divorced woman with a longstanding history of migraine, more commonly  without and with aura.  The patient has been on Topamax for years with  fair control.   Yesterday, around 10:00 p.m. she had onset of shade-like occlusion of  the vision in her right eye associated lasting for seconds.  This was  associated with an achy feeling in her eyes and she felt that there is a  irritation like as if they were allergic reaction.  She also felt that  her right eyelid was droopy.   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  Tonight she had  onset of low loss of vision, again shade-like occlusion, this time  lasting about a minute.  This was associated with holosystolic headache  that she said was migrainous in nature associated with nausea and  achiness in her eyes, right greater than left.  She had worse migraines.  She has never had a vision problem like this until yesterday.   The patient tells me that she lost about 38 pounds in the last 4 months  and has had very fairly consistent abdominal discomfort and nausea.  She  is followed at [facility].   SOCIAL HISTORY:  The patient is obese.  She smokes half-to-one pack per  day of cigarettes and is done so for 25 years.  She has a rare history  of alcohol use.  No drugs.  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   REVIEW OF SYSTEMS:  Positive for muscle aches and arthralgias in October  2008, hypertension, panic attacks,  depression, and acid reflux.   PAST SURGICAL HISTORY:  Cesarean section x2, bilateral tubal ligation,  temporomandibular joint surgery, CAT cystoscopy, D&C x2 and colposcopy.   FAMILY HISTORY:  Both parents are in their 57s and in good health.   PHYSICAL EXAMINATION:  VITAL SIGNS:  Blood pressure 139/83, resting  pulse 90, respirations 18, oxygen saturation 100%, temperature 98.7.  EARS, NOSE AND THROAT:  Negative  LUNGS:  Clear.  HEART:  No murmurs.  Pulses normal.  ABDOMEN:  Protuberant.  Bowel sounds normal.  EXTREMITIES:  Unremarkable.  NEUROLOGIC:  Mental status.  The patient is awake, alert without  dysphasia, dysarthria, and dyspraxia.  CRANIAL NERVES:  Round reactive pupils.  Fundi normal.  Visual fields  full to double simultaneous stimuli.  She has a mild right central 7th  midline tongue and uvula.  Air conduction greater than bone conduction.  Motor examination showed normal strength except for the right hip flexor  which is 4/5.  She has some giveaway in her right leg, but with effort  she had 5/5 in all other muscle groups.  Fine motor movements were  intact.  There is no pronator drift.  Sensation was intact to primary  and cortical modalities.  Cerebellar, finger-to-nose rapid alternating  movements were okay.  Heel-knee-shin was better on the left than the  right partially because of weakness in the right leg.  Gait was not  tested.  Deep tendon reflexes were diminished to absent.  The patient  had bilateral flexor plantar responses.   IMPRESSION:  I cannot rule out amaurosis fugax of the right eye.  (362.34) This does not square with right facial weakness and right leg  weakness. (344.31)  The right facial weakness may just be facial  asymmetry, the right leg weakness could be functional.  The patient has  migraine without aura.  I do not think that is responsible for the  visual problems.  This is not a migrainous visual loss.  I cannot  explain the patient's 38  pounds weight loss associated with the  abdominal discomfort.  We will do a CT of the chest, abdomen, pelvis and  likely ask for assistance with a primary care team.  The patient will be  admitted tonight and have CT scan of the brain, MRA in the morning and  further workup depending upon the results.  A CT angiogram versus the  angiogram all discussed this with Dr. Pearlean Brownie.   Her laboratory tonight, PTT 31.  PT 15.5.  INR 1.2, sodium 144,  potassium 3.4, chloride 109, CO2 22, BUN 8, creatinine 1.1, glucose 87.  White count 5300, hemoglobin 12.4, hematocrit 38.1, platelet count  296,000, 51 polys, 33 lymphs, 14 monos, 2 eosinophils, 1 basophil.  The  patient will be transferred to [facility] and will be placed on  the stroke service.      Deanna Artis. Sharene Skeans, M.D.  Electronically Signed     [facility]/MEDQ  D:  12/08/2007  T:  12/09/2007  Job:  [id number]

## 2010-12-09 NOTE — Op Note (Signed)
NAMECHARIKA, Monique Tyler                 ACCOUNT NO.:  [id number]   MEDICAL RECORD NO.:  [id number]          PATIENT TYPE:  AMB   LOCATION:  SDC                           FACILITY:  [facility]   PHYSICIAN:  Charles A. Delcambre, MDDATE OF BIRTH:  [birth year]   DATE OF PROCEDURE:  05/09/2004  DATE OF DISCHARGE:                                 OPERATIVE REPORT   PREOPERATIVE DIAGNOSES:  1.  Menorrhagia.  2.  Endometrial polyps.   POSTOPERATIVE DIAGNOSES:  1.  Menorrhagia.  2.  Endometrial polyps.   PROCEDURE:  1.  Paracervical block.  2.  Hysteroscopy dilation and curettage.  3.  Polypectomy.   SURGEON:  Charles A. Delcambre, MD   ASSISTANT:  None.   COMPLICATIONS:  None.   ESTIMATED BLOOD LOSS:  25 mL.   I&O'S AND UTERINE DISTENTION MEDIUM:  30-40 mL sorbitol.   SPECIMENS:  1.  Endometrial polyp.  2.  Endometrial curettings.   FINDINGS:  Large fundal polyp.   ANESTHESIA:  General __________endotracheal route.   COUNTS:  Instrument, sponge and needle count correct x2.   DESCRIPTION OF PROCEDURE:  The patient was taken to the operating room,  placed in the supine position, general anesthetic was introduced without  difficulty. She was then placed dorsal lithotomy position in universal  stirrups. Sterile prep and drape was undertaken. A weighted speculum was  placed in the vagina, single tooth tenaculum was placed on the anterior lip  of the cervix. Paracervical block was placed, 20 mL of 0.25% plain Marcaine  was injected at __________.  No evidence of intravascular location of  injection was noted. Hegar dilators were used then to dilate enough to pass  a 5 mm scope. The scope was placed, operative findings were noted above.  There was no evidence of perforation. Using polyp forceps, the polyp was  resected, general curettage with banjo forceps was then undertaken. All  instruments removed, hemostasis was adequate and the patient was awakened  and taken to the recovery room  with physician in attendance after  extubation.      CAD/MEDQ  D:  05/09/2004  T:  05/10/2004  Job:  [id number]

## 2010-12-09 NOTE — H&P (Signed)
NAME:  Monique Tyler, Monique Tyler                           ACCOUNT NO.:  [id number]   MEDICAL RECORD NO.:  [id number]                   PATIENT TYPE:  INP   LOCATION:  5004                                 FACILITY:  [facility]   PHYSICIAN:  Pramod P. Pearlean Brownie, MD                 DATE OF BIRTH:  Feb 04, 1966   DATE OF ADMISSION:  08/28/2003  DATE OF DISCHARGE:                                HISTORY & PHYSICAL   REASON FOR ADMISSION:  Refractory headache.   HISTORY OF PRESENT ILLNESS:  The patient is a 45 year old lady who has had  refractory headache for the last one week.  The patient states that the  headache began like her typical migraine headache which she has had from  teenage years.  She describe the headache as being sharp and throbbing,  mainly in the occipital region, but also extending up into the frontal head  region.  There is the complaint of nausea and some photophobia.  She has  taken several over-the-counter analgesics, including Tylenol and ibuprofen,  as well as OxyContin which gave only partial relief.  She presented to the  emergency room at [facility] on August 25, 2003, where she was found to  have a temperature of 102.  Spinal tap was attempted, but since she needed  sedation, she actually had some respiratory difficulty and required to be  intubated.  Spinal tap apparently did not show any evidence of meningitis,  though the actual report is not available for my review at present.  She was  treated with injections, advised to follow up in the office.  The patient is  worked into the office schedule today upon her request.  She was seen by me  in the office on July 14, 2003, for refractory headache.  At that time,  I thought she had refractory mixed migraine headache with antiemetic.  Recommended she try Depakote 400 mg once a day and increase after a week to  twice a day for prophylaxis and Relpax as needed for headache relief.  She  states the Relpax does not help her  headache significantly.  The Depakote  has not helped in the frequency which she still has about three times a week  or so.  She, however, has been feeling nauseous with the Depakote.  The  patient was advised to do stress relaxation activities which, however, she  has not done.  She had an EEG done as an outpatient which was unremarkable.  MRI scan of the brain which was normal.  MRI scan of the brain on August 05, 2003, which was normal.   PAST MEDICAL HISTORY:  1. Hypertension.  2. Gastroesophageal reflux disease.  3. Attention deficit disorder.  4. Chronic neck pain.   ALLERGIES:  No known drug allergies.   PAST SURGICAL HISTORY:  1. Cesarean section x2.  2. TMJ joint surgery.  3. Cystoscopy.  4. Colonoscopy.   PRESENT MEDICATIONS:  1. Xanax 0.5 mg twice a day.  2. Lisinopril 20 mg daily.  3. OxyContin 10 mg twice a day.  4. Bextra 20 mg daily.  5. Aciphex 20 mg daily.  6. Toprol 50 mg daily.  7. Reglan 10 mg twice a day.   SOCIAL HISTORY:  She is divorced.  She has five children. She lives in  [place].  She had smoked for many years, but has started cutting down  now.  She denies doing drugs or drinking alcohol.   REVIEW OF SYSTEMS:  Significant for recent fever as well chronic persistent  headache.  She has had some trouble sleeping, weight gain, nausea, and  fatigue in the past.  There is present nausea, but no diarrhea or abdominal  pain.  She has some chest pain on and off on the right side.   PHYSICAL EXAMINATION:  GENERAL:  A pleasant young lady who appears to be in  distress from headache.  VITAL SIGNS:  She is afebrile, pulse rate 70 per minute, regular;  respiratory rate 18 per minute, blood pressure 140/80.  Distal pulses well  felt.  HEAD:  Non-traumatic.  NECK:  Supple without bruit.  ENT:  Unremarkable.  CARDIAC:  Normal, no gallop.  NEUROLOGIC:  The patient is awake, alert, and oriented x3 with normal speech  and language function.  There is  no aphasia, apraxia, or dysarthria.  Pupils  are equal and reactive to light.  Visual acuity is 20/30 bilaterally.  Eye  movements are full without nystagmus.  Funduscopic exam reveals sharp disk  margins, face is symmetric, palate elevates normally.  Tongue is midline.  Motor exam reveals symmetric upper and lower extremity strength, tone,  reflexes, coordination, and sensation.  She can walk with a steady gait  doing tandem walking.  ___________ disability.   LABORATORY DATA:  MRI scan of the brain with and without contrast on July 29, 2003, is normal.  EEG is also normal.   IMPRESSION:  A 45 year old lady with refractory mixed migraine and tension  headache.   PLAN:  The patient is admitted for treatment of refractory headache.  Admit  to Dr. Marlis Edelson service.  IV DHG 0.5 mg preceded by 10 mg of Reglan q.8h. for  headache relief.  Stop the Depakote and start Topamax 50 mg twice a day for  headache prophylaxis.  Continue other medications, Xanax, Lisinopril,  Aciphex, Toprol, Ritalin, and Lexapro.  The patient has recently been on a  Medrol Dose-Pak for headache.  We will continue that as 4 mg three tablets  daily for one day, followed by two tablets daily for a day, and then one  tablet daily for a day, then stop.  Recheck EKG's, cardiac enzymes, and  troponin.  Hydration with normal saline.  I have advised the patient to call  if she has worsening of nausea, vomiting, or chest pain.                                                Pramod P. Pearlean Brownie, MD    PPS/MEDQ  D:  08/28/2003  T:  08/29/2003  Job:  [id number]   cc:   L. Lupe Carney, M.D.  [address] [place]  [place] [address]  Fax: [phone]

## 2010-12-09 NOTE — Discharge Summary (Signed)
NAME:  Monique Tyler, Monique Tyler                           ACCOUNT NO.:  [id number]   MEDICAL RECORD NO.:  [id number]                   PATIENT TYPE:  INP   LOCATION:  5739                                 FACILITY:  [facility]   PHYSICIAN:  Lilyan Punt. Sydnee Levans, M.D.             DATE OF BIRTH:  July 14, 1966   DATE OF ADMISSION:  03/14/2003  DATE OF DISCHARGE:  03/16/2003                                 DISCHARGE SUMMARY   PRINCIPAL DIAGNOSES:  1. Pelvic inflammatory syndrome due to Trichomonas, treated with     metronidazole.  2. Abdominal pain secondary to colitis involving the ascending and     descending colon per air contrast barium enema 03/16/2003.  3. Chronic cystitis and chronic hematuria requiring further patient followup     with urology.  4. Pelvic pain of a smoldering nature with evidence of vaginitis Trichomonas     infection, polycystic ovaries, and adnexal tenderness.  5. Menometorrhagia and abnormal menses.  6. Tobacco abuse.  7. Depressive disorder, anxiety, and panic attacks.  8. Attention deficit disorder.  9. Hypertension, controlled.  10.      Temporomandibular joint syndrome, recent outpatient surgery.  11.      Anemia, chronic, related to menorrhagia.  12.      Overactive bladder.   MEDICATIONS:  1. Flagyl 500 mg t.i.d. for an additional 7 days to complete therapy for     colitis and Trichomonas.  2. Oxybutynin 5 mg 2 or 3 times daily p.r.n. overactive bladder.  3. Levsin p.r.n. abdominal cramping.  4. Percocet 1 to 2 q.6h. p.r.n. pain.  Continuation of home medications including:  1. Lexapro 20 mg daily.  2. Ritalin 10 mg b.i.d.  3. Alprazolam 0.5 mg b.i.d.  4. Toprol XL 50 mg daily.  5. Protonix 40 mg p.o. daily.  6. Tylenol p.r.n.  7. Flexeril 10 mg q.h.s. p.r.n.  8. Stool softeners such as Dulcolax (Colace).   HOSPITAL COURSE:  Pleasant African-American female admitted August 18  initially because of uncontrollable pelvic pain.  On hospital day #3, she  was  discharged to home in stable condition with  treatment for trichomonal  vaginal infection and anti-inflammatories for dysmenorrhea.  She had been  previously seen in the outpatient setting the preceding week, once at [facility] and twice in [place] and had embarked on CT scanning and pelvic  sonography which revealed no firm diagnosis.  Wet prep pelvic exam and STD  workup was positive for trichomonas, negative for Chlamydia and gonorrhea.  She was treated and received appropriate counseling. She has somewhat  chronic pelvic pain history and history of irregular menses and menorrhagia  with anemia.  For this reason, GYN consultation has been recommended.   Hematuria has also been intermittent.  The plan is for outpatient urology  followup for consideration of cystoscopy for bladder pathology.  The patient  was discharged on August 21 and was  readmitted.  She had represented to the  emergency department complaining of a single bloody bowel movement.  Evaluation revealed stable hemoglobin and hematocrit; serial guaiac testing  of stool, which was hemoccult negative; fecal white blood cells which were  also negative;  and C. difficile is in process at time of this dictation.  Empirically, treatment was begun towards possible C. difficile colitis with  Flagyl.  Consultation with Roosvelt Harps, M.D., [facility],  upon the finding of colitis led to a recommendation of Flagyl for an  additional 7 days at 500 mg t.i.d.  She will have referral for colonoscopy  to further pursue radiographic abnormalities.   She is discharged in improved condition with stable vital signs, stable  hemoglobin of 10.2, and as noted below.   FOLLOW UP:  1. L. Lupe Carney, M.D.  2. [facility].  3. Urology.  4. Consider gynecology.                                                Lilyan Punt Sydnee Levans, M.D.    KCS/MEDQ  D:  03/16/2003  T:  03/17/2003  Job:  [id number]

## 2010-12-09 NOTE — H&P (Signed)
NAME:  Monique Tyler, Monique Tyler                           ACCOUNT NO.:  [id number]   MEDICAL RECORD NO.:  [id number]                   PATIENT TYPE:  AMB   LOCATION:  SDC                                  FACILITY:  [facility]   PHYSICIAN:  Charles A. Delcambre, MD            DATE OF BIRTH:  01-21-1966   DATE OF ADMISSION:  DATE OF DISCHARGE:                                HISTORY & PHYSICAL   A 45 year old, para 4-0-0-5, irregular periods, suspected endometrial polyp,  menorrhagia and known fibroids to be admitted for hysteroscopy D&C for  evaluation of the above on March 16, 2004.  Endometrial biopsy is done  today, results pending, March 02, 2004.  Sonohysterogram did show irregular  posterior wall endometrium with echogenic focus of approximately 6 cm,  anterior wall irregular area of about 7 mm as well, suspected for  endometrial polyps.  She continues to have irregular bleeding.   PAST MEDICAL HISTORY:  1. Panic attacks.  2. Depression.  3. Acid reflux.  4. Hypertension.  5. ADD.  6. Muscle spasms.  States she currently under evaluation for lupus.   PAST SURGICAL HISTORY:  Cesarean section x2, bilateral tubal ligation, TMJ  surgery, cystoscopy, barium enema.   MEDICATIONS:  1. Xanax 0.5 mg two times a day.  2. Lexapro 20 mg once a day,  3. Aciphex 1 tablet a day.  4. Toprol XL 50 mg once a day.  5. Ritalin 10 mg two times a day.  6. Skelaxin 800 mg four times a day.  7. OxyContin 2 tablets per day.  8. Percocet 4 tablets per day for chronic pain not specified through PCP.  9. Adderall XR 10 mg once a day.  10.      Gabapentin 400 mg t.i.d.  11.      Lexapro changed to Zoloft 1 p.o. q.d. at this time recently several     days ago.   ALLERGIES:  No known drug allergies.   SOCIAL HISTORY:  The patient is divorced, currently sexually active with one  partner. She does not use condoms. She denies tobacco, ethanol or drug use  currently or in the past or STD exposure in the past. 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 Other STD's have been checked recently and were  negative.   PAST MEDICAL HISTORY:  Colposcopy with biopsy was low grade recently.   FAMILY HISTORY:  No cervical, uterus or ovarian cancer.   REVIEW OF SYMPTOMS:  Currently with cough for nine days under evaluation for  bronchitis. Some bruising of the upper back and torso area under evaluation  for lupus with fatigue, weight loss and other nonspecific complaints.  PCP  is Dr. Wylie Hail at [facility]. No chest pain or shortness of breath,  occasional headache, no dizziness. Currently no abdominal pain, dysuria,  bleeding per rectum, hematuria.  Emotional changes, some depression ongoing.  ADD problems ongoing.  PHYSICAL EXAMINATION:  GENERAL:  Alert and oriented x3.  VITAL SIGNS:  Blood pressure 128/80, heart rate 84, respirations 18, weight  171 pounds.  HEENT:  Grossly within normal limits.  NECK:  Supple without thyromegaly or adenopathy.  LUNGS:  Clear bilaterally. No rhonchi or wheezing noted.  BACK:  No CVA tenderness, vertebral column nontender to palpation.  BREASTS:  Reports normal examination with PCP recently.  In recent months  exam not repeated.  HEART:  Regular rate and rhythm without murmurs, rubs or gallops.  ABDOMEN:  Soft, nontender. No hepatosplenomegaly or other masses noted.  Lower quadrants of the flanks and suprapubic area nontender.  PELVIC:  Normal external female genitalia and Bartholin, urethra and Skene's  glands within normal limits. Vault without discharge or lesions.  Cervix  multiparous, no cervical motion tenderness present. Bimanual examination,  uterus not significantly enlarged. Ultrasound had shown up to 2 cm uterine  leiomyomata, ovaries not palpably increased in size. Exam limited per  patient's body habitus.  On ultrasound ovaries normal size.  RECTAL:  Anus, perineal body appear normal otherwise deferred.  EXTREMITIES:  No significant edema,  nontender.   ASSESSMENT:  1. Menorrhagia.  2. Endometrial polyps.  3. Small uterine leiomyomata.   PLAN:  Hysteroscopy D&C.  The patient gives informed consent, accepts risks  of infection, bleeding, uterine perforation, bowel and bladder damage, blood  products risk including hepatitis and HIV exposure.  All questions were  answered. Preoperative CBC will be held in light that she recently had a CBC  with her PCP and we will get a copy of this and get a copy to her chart at  the OR.  HCG will be withheld in that she has had her tubal ligation and  recently had a serum HCG on January 12, 2004 which was negative. She remained  n.p.o. past midnight. As long as bronchitis symptoms will completely resolve  by August 9, we will proceed with surgery on March 16, 2004.  She will be  allowed to take morning medicines with a sip of water.  Questions were  answered and will proceed as outlined.                                               Charles A. Sydnee Cabal, MD    CAD/MEDQ  D:  03/02/2004  T:  03/02/2004  Job:  [id number]

## 2010-12-09 NOTE — Op Note (Signed)
NAME:  MAVIS, GRAVELLE                           ACCOUNT NO.:  [id number]   MEDICAL RECORD NO.:  [id number]                   PATIENT TYPE:  AMB   LOCATION:  NESC                                 FACILITY:  [facility]   PHYSICIAN:  Excell Seltzer. Annabell Howells, M.D.                 DATE OF BIRTH:  1966-04-30   DATE OF PROCEDURE:  04/07/2003  DATE OF DISCHARGE:                                 OPERATIVE REPORT   PROCEDURE:  Cystoscopy with bilateral retrograde pyelogram and  interpretation.   PREOPERATIVE DIAGNOSES:  Right flank pain and hematuria.   POSTOPERATIVE DIAGNOSES:  1. Right flank pain and hematuria.  2. Normal workup.   SURGEON:  Excell Seltzer. Annabell Howells, M.D.   ANESTHESIA:  General.   COMPLICATIONS:  None.   INDICATIONS:  Ms. Wyline Mood is a 45 year old black female, who was initially  seen in consultation for right flank pain and two episodes of gross  hematuria.  She was found to have a small left-sided stone on CT, but  nothing on the right.  She was also felt to possibly have chronic pelvic  inflammatory disease, for which she is treated with Flagyl.  She also has  some symptoms of an overactive bladder.   FINDINGS AND PROCEDURE:  The patient was given Cipro.  She was taken to the  operating room, where a general anesthetic was induced.  She was placed in  the lithotomy position.  Her perineum and genitalia were prepped with  Betadine solution.  She was draped in the usual sterile fashion.   Cystoscopy was performed using the 22-French scope, and the 12 and 70-degree  lenses.  Examination revealed a normal urethra.  The bladder wall was smooth  and pale; without tumors, stones or inflammation.  Ureteral orifices were in  the normal anatomic position, effluxing clear urine.  There was a small cyst  at the dome of the bladder, consistent with urocele cyst.  The bladder  evaluation was completely unremarkable.   Right retrograde pyelogram was performed with a 5-French open-end catheter.  Contrast was instilled in retrograde fashion, with a completely normal renal  collecting system noted.  This procedure was then repeated on the left,  since everything had been negative at that point.  This was also completely  unremarkable.   At this point, the bladder was drained.  Vaginoscopy was performed to rule  out a vaginal source of the blood.  No abnormalities were noted.  Bimanual  examination was also unremarkable.   At this point, the patient was taken down from the lithotomy position.  Her  anesthetic was reversed.  She was moved to the recovery room in stable  condition.  There were no complications.  Excell Seltzer. Annabell Howells, M.D.    JJW/MEDQ  D:  04/07/2003  T:  04/07/2003  Job:  [id number]   cc:   L. Lupe Carney, M.D.  [address] [place]  [place] [address]  Fax: [phone]

## 2010-12-09 NOTE — Discharge Summary (Signed)
NAMEKENDRAH, Monique Tyler               ACCOUNT NO.:  [id number]   MEDICAL RECORD NO.:  [id number]          PATIENT TYPE:  INP   LOCATION:  3039                         FACILITY:  [facility]   PHYSICIAN:  Deanna Artis. Hickling, M.D.DATE OF BIRTH:  [birth year]   DATE OF ADMISSION:  12/09/2007  DATE OF DISCHARGE:  12/10/2007                               DISCHARGE SUMMARY   FINAL DIAGNOSES:  1. Transient visual disturbance, right eye, etiology unknown.  2. Hypertension.  3. Headaches multiple types.  4. Panic attacks.  5. Depression.  6. Gastroesophageal reflux disease.  7. Vaginal bleeding.   PROCEDURES:  An MRI scan of the brain, no acute stroke.  MRA negative.  CT scan of the chest, enlarging lymph nodes.  CT of the abdomen, no  acute abnormalities.  CT of the pelvis, no acute findings.  Carotid  Doppler, no carotid artery stenosis.  A 2-D echocardiogram, ejection  fraction 65%, no regional wall abnormalities.  Lipid panel, HDL 23, LDL  slightly elevated at 347, cholesterol 152, triglycerides 182.  Hemoglobin A1c is 4.6.  Serum homocystine 10.0, CK 115 and 116,  troponins 0.01 and less than 0.01.  No evidence of cardiac ischemia.  Urine toxicology, no drugs are abusive detected.  Urine pregnancy test  was negative.  Urinalysis was normal.  Comprehensive metabolic panel,  sodium 142, potassium 3.4, chloride 118, CO2 21, BUN 8, creatinine 0.89,  glucose 101, calcium 8.6.  AST 36, ALT 35, alkaline phosphatase 94,  bilirubin 0.6, albumin 2.8.   The patient was discharged home with recommendations to see  ophthalmologist, Dr. Michell Heinrich.  Follow-up with neurology as needed.  Follow-up with Gynecology as needed.  She was treated with aspirin 81 mg  a day.   Discharge medicines also include Topamax 50 mg twice daily.  The patient  was discharged in improved condition without symptoms.      Deanna Artis. Sharene Skeans, M.D.  Electronically Signed     [facility]/MEDQ  D:  01/03/2008  T:  01/03/2008   Job:  [id number]

## 2010-12-09 NOTE — Discharge Summary (Signed)
NAME:  Monique Tyler, Monique Tyler                           ACCOUNT NO.:  [id number]   MEDICAL RECORD NO.:  [id number]                   PATIENT TYPE:  INP   LOCATION:  5004                                 FACILITY:  [facility]   PHYSICIAN:  Pramod P. Pearlean Brownie, MD                 DATE OF BIRTH:  09-09-1965   DATE OF ADMISSION:  08/28/2003  DATE OF DISCHARGE:  08/31/2003                                 DISCHARGE SUMMARY   ADMISSION DIAGNOSIS:  Refractory migraine.   DISCHARGE DIAGNOSIS:  Refractory migraine.   SECONDARY DIAGNOSES:  1. Hypertension.  2. Gastroesophageal reflux disease.  3. Attention deficit disorder.  4. Chronic neck pain.   CONDITION ON DISCHARGE:  Improved.   DIET:  Low salt, low fat.   ACTIVITY:  Ad lib.   MEDICATIONS ON DISCHARGE:  1. Topamax 50 mg p.o. b.i.d. (new).  2. DHE 0.5 mg/Reglan 10 mg IM q. 8 hours p.r.n. severe headache (new).  3. Nicotine patch 70 mg per 24 hours times 14 days, then discontinue (new).  4. Xanax 0.5 mg b.i.d.  5. Lisinopril 20 mg q. day.  6. Protonix 40 mg q. day.  7. Toprol XL 15 mg q. day.  8. Lexapro 20 mg q. day.  9. Ritalin 10 mg q.a.m. and q.p.m.  10.      DC Depakote and Medrol.  11.      If possible, DC tramadol and OxyContin.   STUDIES:  None.   HOSPITAL COURSE:  Please see admission H&P by Dr. Janalyn Shy P. Sethi for full  admission details.  Briefly, this is a 45 year old woman who was admitted  from my office on August 28, 2003 for migraine refractory to medications,  along with persistent nausea and vomiting and resultant mild dehydration.  She was admitted to the hospital for IM DHE protocol, to which she responded  quite well.  She received the full nine doses of protocol without  difficulty.  Her prophylactic medication was changed, Depakote to Topamax,  without difficulty and she completed a Medrol dose taper.  On the morning of  August 31, 2003 she felt well.  She was presented with rescue options  including intramuscular  DHE, as well as ___________ and chose the former.  She was instructed on the use of intramuscular DHE and Reglan prior to  discharge and subsequently was discharged home.  She did express interest in  stopping some of her medications and was advised to discontinue tramadol,  which was not helpful for her anyway, and OxyContin, which she has been  desiring to discontinue.   FOLLOWUP:  She will follow up with her therapist and Dr. Lupe Carney as  scheduled.  She is advised to call [facility] at [phone] for an appointment with Dr. Pearlean Brownie.      Michael L. Thad Ranger, M.D.  Pramod P. Pearlean Brownie, MD    MLR/MEDQ  D:  08/31/2003  T:  08/31/2003  Job:  [id number]   cc:   L. Lupe Carney, M.D.  [address] [place]  [place] [address]  Fax: [phone]

## 2010-12-09 NOTE — Discharge Summary (Signed)
NAME:  Monique Tyler, Monique Tyler                           ACCOUNT NO.:  [id number]   MEDICAL RECORD NO.:  [id number]                   PATIENT TYPE:  INP   LOCATION:  5705                                 FACILITY:  [facility]   PHYSICIAN:  Lilyan Punt. Sydnee Levans, M.D.             DATE OF BIRTH:  Jun 15, 1966   DATE OF ADMISSION:  03/11/2003  DATE OF DISCHARGE:  03/14/2003                                 DISCHARGE SUMMARY   PRIMARY MEDICAL DOCTOR:  L. Lupe Carney, M.D.   DISCHARGE DIAGNOSES:  1. Pelvic inflammatory disease secondary to Trichomonas.  2. Chronic abdominal pain.  3. Chronic hematuria, stable.  4. Chronic menorrhagia.  5. Yeast urinary tract infection.  6. Tobacco abuse.  7. Hypertension.  8. Panic disorder.  9. Attention deficit disorder.  10.      Depression.  11.      History of chest and abdominal pain.  12.      History of recent temporomandibular joint surgery.  13.      Overactive bladder.   DISCHARGE MEDICATIONS:  1. Sulindac 200 mg b.i.d. for seven days.  2. Percocet one to two q.6h. p.r.n.  3. Iron supplements.  4. Oxybutynin 5 mg b.i.d. to t.i.d. p.r.n.  5. Lexapro 20 mg a day.  6. Ritalin 10 mg b.i.d.  7. Xanax 0.5 mg b.i.d.  8. Toprol 50 mg daily.  9. Ibuprofen 800 mg p.r.n.  10.      Toradol 10 mg p.r.n.  11.      Aciphex 20 mg one p.o. daily.  12.      Flexeril 10 mg one p.o. q.h.s.   ALLERGIES:  NKDA.   PROCEDURES:  None.   HISTORY OF PRESENT ILLNESS:  This is a 45 year old woman who presents with  complaint of increasing abdominal pain for which she has tried Flexeril,  Xanax, Celebrex, and Vicodin.  The patient has recently been worked for same  with two MRIs with equivocal results.  Finding of questionable  endometriosis.  She denies fever, chills, shortness of breath, nausea, or  vomiting.  She has noted hematuria and urinary hesitancy of late.  The  patient states that she is unable to function secondary to her severe  abdominal pain.  She is  admitted for further evaluation and treatment.   PHYSICAL EXAMINATION:  On pelvic exam, the patient is noted to have moderate  thin, white discharge and a foul odor with positive cervical motion  tenderness and right adnexal pain.  Wet prep reveals presence of  Trichomonas.  GC and chlamydia were negative on this visit.  The patient is  therefore treated for PID secondary to Trichomonas.  The patient is also  noted to have yeast infection in her urine.  She was treated for that.   Of concern is the patient's chronic hematuria.  Would recommend cystoscopy  and urology workup.  The patient is a cigarette smoking  and therefore has an  increased risk of bladder cancer and may have interstitial cystitis.  Would  also recommend gynecology for chronic pelvic pain and long history of  irregular menses, as well as menorrhagia and questionable endometriosis.  All of these were discussed at length with the patient.  She is instructed  to return to her primary M.D. within seven days of discharge to see a  gynecologist and a urologist both within the next month after discharge.  At  the time of discharge, the patient feels substantially better.  She has had  no nausea or vomiting.  She has had some mild itching from antibiotics.  Her  pain is improved.  Her temperature is 97.3 degrees, blood pressure 130/72,  pulse 88, respirations 22, and room air saturations are 95%.   DISCHARGE LABORATORY DATA:  White blood cell count 5.8, hemoglobin 10.5,  hematocrit 31.8, and platelet count 336,000.  The ESR is 9.  Sodium 141,  potassium 4.0, glucose 93, BUN 6, creatinine 0.7, total bilirubin 0.6, AST  28, ALT 28, ALP 98.  Urine pregnancy is negative.  Blood cultures were  negative x 2.   CONSULTS:  None.   CONDITION ON DISCHARGE:  Good.   DISPOSITION:  Discharged to home.   FOLLOWUP:  The patient is instructed to follow up with her primary M.D. in  one week's time and the gynecologist and urologist within  the next month.      Monique Tyler. Monique Tyler Sydnee Levans, M.D.    SMD/MEDQ  D:  04/21/2003  T:  04/21/2003  Job:  [id number]

## 2010-12-09 NOTE — Op Note (Signed)
   NAME:  Monique Tyler, Monique Tyler                           ACCOUNT NO.:  [id number]   MEDICAL RECORD NO.:  [id number]                   PATIENT TYPE:  AMB   LOCATION:  ENDO                                 FACILITY:  [facility]   PHYSICIAN:  Graylin Shiver, M.D.                DATE OF BIRTH:  January 11, 1966   DATE OF PROCEDURE:  05/18/2003  DATE OF DISCHARGE:                                 OPERATIVE REPORT   PROCEDURE:  Colonoscopy.   INDICATION FOR PROCEDURE:  Abnormal barium enema recently which raised the  suspicion of colitis and also the possibility of a polyp at the level of the  hepatic flexure.   Informed consent was obtained after explanation of the risks of bleeding,  infection, and perforation.   PREMEDICATIONS:  1. Fentanyl 100 mcg IV.  2. Versed 7 mg IV.   DESCRIPTION OF PROCEDURE:  With the patient in the left lateral decubitus  position, a rectal exam was performed, and no masses were felt.  The Olympus  colonoscope was inserted into the rectum and advanced around the colon to  the cecum.  Cecal landmarks were identified.  The cecum and ascending colon  were normal.  The transverse colon was normal.  The area of the hepatic  flexure did not reveal any evidence of a polyp.  The descending colon,  sigmoid, and rectum were normal.  There was no evidence of colitis seen on  this examination.  She tolerated the procedure well without complications.   IMPRESSION:  Normal colonoscopy to the cecum.                                               Graylin Shiver, M.D.    Germain Osgood  D:  05/18/2003  T:  05/18/2003  Job:  [id number]   cc:   L. Lupe Carney, M.D.  [address] [place]  [place] [address]  Fax: [phone]

## 2011-04-19 LAB — COMPREHENSIVE METABOLIC PANEL
ALT: 35
AST: 36
Alkaline Phosphatase: 94
CO2: 21
Calcium: 8.6
GFR calc Af Amer: >60
GFR calc non Af Amer: >60
Glucose, Bld: 101 — ABNORMAL HIGH
Potassium: 3.4 — ABNORMAL LOW
Sodium: 142

## 2011-04-19 LAB — URINALYSIS, ROUTINE W REFLEX MICROSCOPIC
Bilirubin Urine: NEGATIVE
Glucose, UA: NEGATIVE
Hgb urine dipstick: NEGATIVE
Ketones, ur: NEGATIVE
Protein, ur: NEGATIVE
Urobilinogen, UA: 1

## 2011-04-19 LAB — DIFFERENTIAL
Basophils Absolute: 0
Basophils Relative: 1
Eosinophils Relative: 2
Lymphocytes Relative: 33
Monocytes Absolute: 0.7
Neutro Abs: 2.7

## 2011-04-19 LAB — POCT I-STAT, CHEM 8
BUN: 8
Calcium, Ion: 1.3
Creatinine, Ser: 1.1
Hemoglobin: 13.9
Sodium: 144
TCO2: 22

## 2011-04-19 LAB — LIPID PANEL
Cholesterol: 152
HDL: 23 — ABNORMAL LOW
LDL Cholesterol: 103 — ABNORMAL HIGH
Total CHOL/HDL Ratio: 6.6
VLDL: 26

## 2011-04-19 LAB — CARDIAC PANEL(CRET KIN+CKTOT+MB+TROPI)
Relative Index: 0.7
Total CK: 115
Total CK: 116
Troponin I: <0.01

## 2011-04-19 LAB — PROTIME-INR: Prothrombin Time: 15.5 — ABNORMAL HIGH

## 2011-04-19 LAB — RAPID URINE DRUG SCREEN, HOSP PERFORMED
Amphetamines: NOT DETECTED
Barbiturates: NOT DETECTED
Benzodiazepines: NOT DETECTED
Cocaine: NOT DETECTED
Opiates: NOT DETECTED

## 2011-04-19 LAB — CBC
HCT: 38.1
MCHC: 32.5
Platelets: 296
RDW: 13.7

## 2011-04-19 LAB — PREGNANCY, URINE: Preg Test, Ur: NEGATIVE

## 2011-04-26 LAB — CBC
HCT: 33.8 — ABNORMAL LOW
Hemoglobin: 11 — ABNORMAL LOW
MCHC: 32.5
MCV: 86
Platelets: 258
RBC: 3.93
RDW: 13.4
WBC: 4.9

## 2011-04-26 LAB — DIFFERENTIAL
Basophils Absolute: 0
Basophils Relative: 0
Eosinophils Absolute: 0
Eosinophils Relative: 1
Lymphocytes Relative: 7 — ABNORMAL LOW
Lymphs Abs: 0.3 — ABNORMAL LOW
Monocytes Absolute: 0.6
Monocytes Relative: 12
Neutro Abs: 3.9
Neutrophils Relative %: 81 — ABNORMAL HIGH

## 2011-04-26 LAB — COMPREHENSIVE METABOLIC PANEL
AST: 35
Albumin: 3.4 — ABNORMAL LOW
Alkaline Phosphatase: 95
BUN: 9
Creatinine, Ser: 0.84
GFR calc Af Amer: >60
Potassium: 4
Total Protein: 5.6 — ABNORMAL LOW

## 2011-04-26 LAB — POCT CARDIAC MARKERS
Myoglobin, poc: 103
Troponin i, poc: <0.05
Troponin i, poc: <0.05

## 2011-05-02 ENCOUNTER — Emergency Department (HOSPITAL_COMMUNITY): Payer: 59

## 2011-05-02 ENCOUNTER — Observation Stay (HOSPITAL_COMMUNITY): Payer: 59

## 2011-05-02 ENCOUNTER — Observation Stay (HOSPITAL_COMMUNITY)
Admission: EM | Admit: 2011-05-02 | Discharge: 2011-05-04 | Disposition: A | Discharge status: Home / Self Care | Payer: 59 | Attending: Internal Medicine | Admitting: Internal Medicine

## 2011-05-02 DIAGNOSIS — D649 Anemia, unspecified: Secondary | ICD-10-CM | POA: Insufficient documentation

## 2011-05-02 DIAGNOSIS — R0789 Other chest pain: Principal | ICD-10-CM | POA: Insufficient documentation

## 2011-05-02 DIAGNOSIS — R1013 Epigastric pain: Secondary | ICD-10-CM | POA: Insufficient documentation

## 2011-05-02 DIAGNOSIS — R1011 Right upper quadrant pain: Secondary | ICD-10-CM | POA: Insufficient documentation

## 2011-05-02 DIAGNOSIS — F329 Major depressive disorder, single episode, unspecified: Secondary | ICD-10-CM | POA: Insufficient documentation

## 2011-05-02 DIAGNOSIS — M549 Dorsalgia, unspecified: Secondary | ICD-10-CM | POA: Insufficient documentation

## 2011-05-02 DIAGNOSIS — R599 Enlarged lymph nodes, unspecified: Secondary | ICD-10-CM | POA: Insufficient documentation

## 2011-05-02 DIAGNOSIS — G43909 Migraine, unspecified, not intractable, without status migrainosus: Secondary | ICD-10-CM | POA: Insufficient documentation

## 2011-05-02 DIAGNOSIS — M25569 Pain in unspecified knee: Secondary | ICD-10-CM | POA: Insufficient documentation

## 2011-05-02 DIAGNOSIS — F3289 Other specified depressive episodes: Secondary | ICD-10-CM | POA: Insufficient documentation

## 2011-05-02 DIAGNOSIS — R0602 Shortness of breath: Secondary | ICD-10-CM | POA: Insufficient documentation

## 2011-05-02 DIAGNOSIS — Z79899 Other long term (current) drug therapy: Secondary | ICD-10-CM | POA: Insufficient documentation

## 2011-05-02 DIAGNOSIS — R110 Nausea: Secondary | ICD-10-CM | POA: Insufficient documentation

## 2011-05-02 LAB — CBC
HCT: 39.8 % (ref 36.0–46.0)
HCT: 35.2 % — ABNORMAL LOW (ref 36.0–46.0)
Hemoglobin: 11.2 g/dL — ABNORMAL LOW (ref 12.0–15.0)
MCH: 27.2 pg (ref 26.0–34.0)
MCHC: 31.7 g/dL (ref 30.0–36.0)
MCHC: 31.8 g/dL (ref 30.0–36.0)
MCV: 85.4 fL (ref 78.0–100.0)
RDW: 12.8 % (ref 11.5–15.5)

## 2011-05-02 LAB — DIFFERENTIAL
Basophils Absolute: 0 10*3/uL (ref 0.0–0.1)
Basophils Relative: 1 % (ref 0–1)
Basophils Relative: 0 % (ref 0–1)
Eosinophils Absolute: 0 10*3/uL (ref 0.0–0.7)
Eosinophils Relative: 1 % (ref 0–5)
Lymphocytes Relative: 43 % (ref 12–46)
Lymphs Abs: 2.4 10*3/uL (ref 0.7–4.0)
Monocytes Absolute: 0.3 10*3/uL (ref 0.1–1.0)
Monocytes Absolute: 0.5 10*3/uL (ref 0.1–1.0)
Monocytes Relative: 9 % (ref 3–12)
Neutro Abs: 2.6 10*3/uL (ref 1.7–7.7)

## 2011-05-02 LAB — POCT I-STAT TROPONIN I: Troponin i, poc: 0.01 ng/mL (ref 0.00–0.08)

## 2011-05-02 LAB — COMPREHENSIVE METABOLIC PANEL
ALT: 9 U/L (ref 0–35)
Alkaline Phosphatase: 81 U/L (ref 39–117)
BUN: 7 mg/dL (ref 6–23)
CO2: 22 mEq/L (ref 19–32)
Chloride: 111 mEq/L (ref 96–112)
GFR calc Af Amer: >90 mL/min (ref >90)
Glucose, Bld: 90 mg/dL (ref 70–99)
Potassium: 3.5 mEq/L (ref 3.5–5.1)
Total Bilirubin: 0.2 mg/dL — ABNORMAL LOW (ref 0.3–1.2)

## 2011-05-02 LAB — URINALYSIS, ROUTINE W REFLEX MICROSCOPIC
Bilirubin Urine: NEGATIVE
Ketones, ur: NEGATIVE mg/dL
Nitrite: NEGATIVE
pH: 6.5 (ref 5.0–8.0)

## 2011-05-02 LAB — BASIC METABOLIC PANEL
BUN: 9 mg/dL (ref 6–23)
GFR calc Af Amer: >90 mL/min (ref >90)
GFR calc non Af Amer: >90 mL/min (ref >90)
Potassium: 3.9 mEq/L (ref 3.5–5.1)
Sodium: 140 mEq/L (ref 135–145)

## 2011-05-02 LAB — CARDIAC PANEL(CRET KIN+CKTOT+MB+TROPI): Total CK: 103 U/L (ref 7–177)

## 2011-05-02 LAB — GLUCOSE, CAPILLARY

## 2011-05-03 ENCOUNTER — Observation Stay (HOSPITAL_COMMUNITY): Payer: 59

## 2011-05-03 LAB — CBC
HCT: 35.4 — ABNORMAL LOW
Hemoglobin: 11.8 — ABNORMAL LOW
MCH: 26.9 pg (ref 26.0–34.0)
MCHC: 30.9 g/dL (ref 30.0–36.0)
MCHC: 33.3
MCV: 87.2 fL (ref 78.0–100.0)
MCV: 82.2
Platelets: 249 10*3/uL (ref 150–400)
Platelets: 326
RBC: 4.3
RDW: 12.7 % (ref 11.5–15.5)
RDW: 13.6
WBC: 5.2 10*3/uL (ref 4.0–10.5)
WBC: 4.8

## 2011-05-03 LAB — URINALYSIS, ROUTINE W REFLEX MICROSCOPIC
Bilirubin Urine: NEGATIVE
Hgb urine dipstick: NEGATIVE
Ketones, ur: NEGATIVE
Ketones, ur: NEGATIVE mg/dL
Nitrite: NEGATIVE
Protein, ur: NEGATIVE mg/dL
Urobilinogen, UA: 0.2 mg/dL (ref 0.0–1.0)
pH: 5.5

## 2011-05-03 LAB — DIFFERENTIAL
Basophils Absolute: 0
Basophils Relative: 1
Eosinophils Absolute: 0
Monocytes Relative: 7
Neutro Abs: 3.2
Neutrophils Relative %: 66

## 2011-05-03 LAB — POCT CARDIAC MARKERS
CKMB, poc: <1 — ABNORMAL LOW
Myoglobin, poc: 45.5
Operator id: 4001
Troponin i, poc: <0.05

## 2011-05-03 LAB — COMPREHENSIVE METABOLIC PANEL WITH GFR
ALT: 17
AST: 19
Albumin: 3.3 — ABNORMAL LOW
Alkaline Phosphatase: 85
BUN: 9
CO2: 26
Calcium: 9
Chloride: 110
Creatinine, Ser: 0.76
GFR calc non Af Amer: >60
Glucose, Bld: 102 — ABNORMAL HIGH
Potassium: 3.9
Sodium: 140
Total Bilirubin: 0.7
Total Protein: 6.1

## 2011-05-03 LAB — CARDIAC PANEL(CRET KIN+CKTOT+MB+TROPI)
CK, MB: 1.4 ng/mL (ref 0.3–4.0)
Relative Index: INVALID (ref 0.0–2.5)
Total CK: 95 U/L (ref 7–177)

## 2011-05-03 LAB — URINE MICROSCOPIC-ADD ON

## 2011-05-03 LAB — COMPREHENSIVE METABOLIC PANEL
Alkaline Phosphatase: 79 U/L (ref 39–117)
BUN: 8 mg/dL (ref 6–23)
CO2: 23 mEq/L (ref 19–32)
Chloride: 111 mEq/L (ref 96–112)
Creatinine, Ser: 0.8 mg/dL (ref 0.50–1.10)
GFR calc non Af Amer: 86 mL/min — ABNORMAL LOW (ref >90)
Glucose, Bld: 97 mg/dL (ref 70–99)
Total Bilirubin: 0.2 mg/dL — ABNORMAL LOW (ref 0.3–1.2)

## 2011-05-03 LAB — RAPID URINE DRUG SCREEN, HOSP PERFORMED
Amphetamines: NOT DETECTED
Benzodiazepines: NOT DETECTED
Opiates: POSITIVE — AB
Tetrahydrocannabinol: NOT DETECTED

## 2011-05-03 LAB — LIPID PANEL: LDL Cholesterol: 105 mg/dL — ABNORMAL HIGH (ref 0–99)

## 2011-05-03 LAB — SEDIMENTATION RATE: Sed Rate: 1 mm/hr (ref 0–22)

## 2011-05-03 LAB — PREGNANCY, URINE: Preg Test, Ur: NEGATIVE

## 2011-05-03 LAB — LIPASE, BLOOD: Lipase: 33 U/L (ref 11–59)

## 2011-05-03 LAB — D-DIMER, QUANTITATIVE: D-Dimer, Quant: 0.53 — ABNORMAL HIGH

## 2011-05-03 MED ORDER — IOHEXOL 300 MG/ML  SOLN
100.0000 mL | Freq: Once | INTRAMUSCULAR | Status: AC | PRN
  Administered 2011-05-03: 100 mL via INTRAVENOUS

## 2011-05-03 NOTE — H&P (Signed)
Monique Tyler, Monique Tyler        ACCOUNT NO.:  [id number]  MEDICAL RECORD NO.:  [id number]  LOCATION:  WLED                         FACILITY:  [facility]  PHYSICIAN:  Standley Dakins, MD   DATE OF BIRTH:  [birth year]  DATE OF ADMISSION:  05/02/2011 DATE OF DISCHARGE:                             HISTORY & PHYSICAL   PRIMARY CARE PHYSICIAN:  None.  CHIEF COMPLAINT:  Chest pain.  HISTORY OF PRESENT ILLNESS:  This patient is a 45 year old female with migraine headaches, who presented to the emergency department complaining of chest discomfort, chest pain in the center of the chest that started last night.  The patient reports that she noticed that the chest pain started suddenly without provocation and persisted overnight. It initially was 4/10 and then her chest pain became worse by the next morning.  She reports that she went to bed and it was 4/10 and when she woke up in the morning, it was 8/10.  She says when she woke up the nextmorning, it was harder for her to breathe as well.  She denies having nausea and vomiting.  She denies any dietary changes recently.  No high fatty meals recently.  The patient was seen in the emergency department and given a GI cocktail and pain medications which did help some, but she persisted to have pain and she also requested further evaluation and management.  The patient reports that if she takes a deep breath, her pain does get worse.  Also, there was some reproduction of the pain with palpation of the chest wall.  The patient reports that she is not having any nausea or vomiting at this time, but she does have pain with palpation of her abdomen in the right upper quadrant.  Hospital admission was requested for further evaluation and management.  PAST MEDICAL HISTORY:  Migraine headaches, relieved by prophylaxis with Topamax which she takes on a daily basis.  CURRENT MEDICATIONS: 1. Topamax 50 mg 1 p.o. daily. 2. Ibuprofen 200 mg 1-2 tabs every  8 hours as needed for pain. 3. Vicodin 5/500 one tab every 4 hours as needed for pain. 4. Gentamicin ophthalmic solution 0.3% 1 drop to the left eye twice     daily for 7 days.  ALLERGIES:  No known drug allergies.  Food allergies, SHELLFISH and SEAFOOD.  FAMILY HISTORY:  The patient reports that she has a family history of hypertension.  SOCIAL HISTORY:  The patient reports that she has no tobacco history, she does not use alcohol or recreational drugs, and she lives in [place], [place].  REVIEW OF SYSTEMS:  Significant for all the systems and symptoms listed in the HPI, otherwise all reviewed and reported as negative.  PHYSICAL EXAMINATION:  VITAL SIGNS:  Temperature 98.2, pulse 82, respirations 18, blood pressure 115/59, pulse ox 100% on room air. GENERAL:  This is a well-developed female.  She is awake, alert, in the bed in no apparent distress, cooperative and pleasant, very well-spoken, appears her stated age. HEENT:  Normocephalic, atraumatic.  Mucous membranes moist.  Sclerae clear.  Ear, nose, and throat normal. NECK:  Supple.  Thyroid soft.  No nodules or masses palpated. LUNGS:  Bilateral breath sounds, clear to auscultation. CARDIAC:  Normal  S1 and S2 sounds without murmurs, rubs, or gallops. ABDOMEN:  Soft.  Bowel sounds present.  Mild right upper quadrant tenderness with deep palpation. EXTREMITIES:  No pretibial edema, cyanosis, or clubbing. NEUROLOGICAL EXAM:  No focal findings. SKIN EXAM:  No gross abnormalities noted. PSYCHIATRIC EXAM:  No gross abnormalities noted.  LABORATORY DATA:  D-dimer 0.46.  Chest x-ray negative.  Glucose 78. Urinalysis negative.  Sodium 140, potassium 3.9, creatinine 0.79, calcium 9.6.  Troponin 0.01.  IMPRESSION: 1. This is a 45 year old female with atypical chest pain. 2. Right upper quadrant abdominal pain. 3. Question of gastroesophageal reflux. 4. Migraine headaches.  PLAN: 1. The patient is going to be admitted  to the hospital under     observation to rule out cardiac injury by cycling cardiac enzymes. 2. We will check an ultrasound of the right upper quadrant to rule out     gallbladder disease as a cause of her pain. 3. We will order a lipase level in the morning. 4. We will prescribe Protonix for acid reflux treatment. 5. The patient received a GI cocktail in the emergency department. 6. We will run gentle IV fluids. 7. The patient will complete taking gentamicin solution as prescribed     for the full 7 days. 8. Continue Topamax for migraine prophylaxis. 9. We will continue to monitor results and hospital course and make     adjustments to care as required.     Standley Dakins, MD     CJ/MEDQ  D:  05/02/2011  T:  05/02/2011  Job:  [id number]  Electronically Signed by Standley Dakins  on 05/03/2011 11:48:32 PM

## 2011-05-10 LAB — URINE CULTURE: Colony Count: 80000

## 2011-05-10 LAB — COMPREHENSIVE METABOLIC PANEL
Albumin: 3.3 — ABNORMAL LOW
BUN: 11
Calcium: 8.8
Chloride: 107
Creatinine, Ser: 0.8
GFR calc Af Amer: >60
GFR calc non Af Amer: >60
Total Bilirubin: 0.7

## 2011-05-10 LAB — DIFFERENTIAL
Basophils Absolute: 0.1
Lymphocytes Relative: 37
Lymphs Abs: 2.3
Monocytes Absolute: 0.5
Neutro Abs: 3.3

## 2011-05-10 LAB — CBC
HCT: 33.2 — ABNORMAL LOW
MCHC: 32.7
MCV: 83.8
Platelets: 284
WBC: 6.3

## 2011-05-10 LAB — URINALYSIS, ROUTINE W REFLEX MICROSCOPIC
Glucose, UA: NEGATIVE
Nitrite: NEGATIVE
Specific Gravity, Urine: 1.02
pH: 7.5

## 2011-05-21 NOTE — Discharge Summary (Signed)
  NAMEGEANINE, Monique Tyler        ACCOUNT NO.:  [id number]  MEDICAL RECORD NO.:  [id number]  LOCATION:  1413                         FACILITY:  [facility]  PHYSICIAN:  Hartley Barefoot, MD    DATE OF BIRTH:  1965/09/17  DATE OF ADMISSION:  05/02/2011 DATE OF DISCHARGE:  05/04/2011                              DISCHARGE SUMMARY   DISCHARGE DIAGNOSES: 1. Atypical chest pain secondary to esophagitis versus musculoskeletal     pain. 2. Other past medical history of migraine. 3. Anemia. 4. A prior history of transient visual disturbance of the right eye. 5. Depression. 6. History of panic attacks.  DISCHARGE MEDICATIONS: 1. Zofran 4 mg q.6 hours as needed. 2. Protonix 40 mg p.o. b.i.d. with meals. 3. Gentamicin ophthalmic 0.3% 1 drop in the left eye b.i.d. 4. Topamax 50 mg p.o. b.i.d. 5. Vicodin 5/500 mg 1 tab by mouth every 4 hours as needed for pain. Medication I stopped during this hospitalization, ibuprofen.  BRIEF HISTORY OF PRESENT ILLNESS:  This is a 45 year old with history of migraines who presents complaining of chest discomfort in the mid of the chest, reproduced with movement, sometimes with deep breath.  She is having some nausea. 1. Chest pain.  This is probably secondary to musculoskeletal pain     versus esophagitis.  Cardiac enzymes x3 negative.  LDL was fine at     102.  ESR was normal at 1.  Lipase was normal at 33, unlikely     pancreatitis.  Liver function tests were normal.  TSH was normal at     3.6.  UA was normal.  UDS was positive only for opioids.     Right upper quadrant ultrasound was negative for cholelithiasis, was     normal.  A CT chest due to a prior history of bilateral hilar     lymphadenopathy and concern for any other pathology was negative.  Next,     the patient was started on Protonix.  She has continued to have pain.     She has been tolerating diet.  We are going to let her go with pain     medication as needed.  I will stop ibuprofen to  avoid irritation to her     stomach or esophagus.  She will need to follow with her primary care     physician in 2 weeks.  If no improvement, she might need to be referred     for an endoscopy or she might need an 2D echo.  On the day of discharge, the patient was in a stable condition.  For her migraine, we will continue with her current medication.  On the day of discharge, blood pressure 114/77, sat 98 on room air, respirations 16, pulse 67, and temp 98.1.     Hartley Barefoot, MD     BR/MEDQ  D:  05/04/2011  T:  05/04/2011  Job:  [id number]  Electronically Signed by Hartley Barefoot MD on 05/21/2011 03:24:26 PM

## 2011-06-16 ENCOUNTER — Encounter: Payer: Self-pay | Admitting: *Deleted

## 2011-06-16 ENCOUNTER — Emergency Department (HOSPITAL_BASED_OUTPATIENT_CLINIC_OR_DEPARTMENT_OTHER)
Admission: EM | Admit: 2011-06-16 | Discharge: 2011-06-16 | Disposition: A | Discharge status: Home / Self Care | Payer: 59 | Attending: Emergency Medicine | Admitting: Emergency Medicine

## 2011-06-16 DIAGNOSIS — H9209 Otalgia, unspecified ear: Secondary | ICD-10-CM | POA: Insufficient documentation

## 2011-06-16 MED ORDER — OXYCODONE-ACETAMINOPHEN 5-325 MG PO TABS
2.0000 | ORAL_TABLET | Freq: Once | ORAL | Status: AC
  Administered 2011-06-16: 2 via ORAL
  Filled 2011-06-16: qty 2

## 2011-06-16 MED ORDER — AMOXICILLIN 500 MG PO CAPS: 500.0000 mg | ORAL_CAPSULE | Freq: Three times a day (TID) | ORAL | Status: AC

## 2011-06-16 NOTE — ED Notes (Signed)
Pt with bilat ear pain denies congestion or fever

## 2011-06-16 NOTE — ED Provider Notes (Signed)
History     CSN: [id number] Arrival date & time: 06/16/2011 12:32 AM   First MD Initiated Contact with Patient 06/16/11 0221      Chief Complaint  Patient presents with  . Otalgia     Patient is a 45 y.o. female presenting with ear pain.  Otalgia This is a new problem. The current episode started 1 to 2 hours ago. There is pain in both ears. The problem occurs constantly. The problem has not changed since onset.There has been no fever. The pain is moderate. Pertinent negatives include no ear discharge, no headaches, no hearing loss, no rhinorrhea, no sore throat and no neck pain. Her past medical history does not include chronic ear infection or hearing loss.   No trauma No visual loss No HA  History reviewed. No pertinent past medical history.  Past Surgical History  Procedure Date  . Cesarean section   . Mandible surgery     History reviewed. No pertinent family history.  History  Substance Use Topics  . Smoking status: Never Smoker   . Smokeless tobacco: Not on file  . Alcohol Use: No    OB History    Grav Para Term Preterm Abortions TAB SAB Ect Mult Living   5 5       1        Review of Systems  HENT: Positive for ear pain. Negative for hearing loss, sore throat, rhinorrhea, neck pain and ear discharge.   Neurological: Negative for headaches.    Allergies  Review of patient's allergies indicates no known allergies.  Home Medications   Current Outpatient Rx  Name Route Sig Dispense Refill  . TOPIRAMATE 50 MG PO TABS Oral Take 50 mg by mouth 2 (two) times daily.      . AMOXICILLIN 500 MG PO CAPS Oral Take 1 capsule (500 mg total) by mouth 3 (three) times daily. 21 capsule 0    BP 131/74  Pulse 71  Temp(Src) 98.5 F (36.9 C) (Oral)  Resp 14  SpO2 99%  LMP 05/16/2011  Physical Exam  CONSTITUTIONAL: Well developed/well nourished HEAD AND FACE: Normocephalic/atraumatic EYES: EOMI/PERRL ENMT: Mucous membranes moist.  Bilateral TM intact.  Right  ear tender to palpation, no mastoid tenderness, ears symmetric.  Right TM appears to have some mild erythema with small effusion noted.   NECK: supple no meningeal signs CV: S1/S2 noted, no murmurs/rubs/gallops noted LUNGS: Lungs are clear to auscultation bilaterally, no apparent distress ABDOMEN: soft, nontender, no rebound or guarding NEURO: Pt is awake/alert, moves all extremitiesx4 EXTREMITIES: pulses normal, full ROM SKIN: warm, color normal PSYCH: no abnormalities of mood noted   ED Course  Procedures     1. Otalgia       MDM  Nursing notes reviewed and considered in documentation          Joya Gaskins, MD 06/16/11 [phone]

## 2011-06-18 ENCOUNTER — Emergency Department (HOSPITAL_BASED_OUTPATIENT_CLINIC_OR_DEPARTMENT_OTHER)
Admission: EM | Admit: 2011-06-18 | Discharge: 2011-06-18 | Disposition: A | Discharge status: Home / Self Care | Payer: 59 | Attending: Emergency Medicine | Admitting: Emergency Medicine

## 2011-06-18 ENCOUNTER — Encounter (HOSPITAL_BASED_OUTPATIENT_CLINIC_OR_DEPARTMENT_OTHER): Payer: Self-pay | Admitting: Emergency Medicine

## 2011-06-18 DIAGNOSIS — K219 Gastro-esophageal reflux disease without esophagitis: Secondary | ICD-10-CM | POA: Insufficient documentation

## 2011-06-18 DIAGNOSIS — H9209 Otalgia, unspecified ear: Secondary | ICD-10-CM | POA: Insufficient documentation

## 2011-06-18 DIAGNOSIS — H6690 Otitis media, unspecified, unspecified ear: Secondary | ICD-10-CM | POA: Insufficient documentation

## 2011-06-18 HISTORY — DX: Gastro-esophageal reflux disease without esophagitis: K21.9

## 2011-06-18 HISTORY — DX: Other nonspecific abnormal finding of lung field: R91.8

## 2011-06-18 MED ORDER — HYDROCODONE-ACETAMINOPHEN 5-500 MG PO TABS: 1.0000 | ORAL_TABLET | Freq: Four times a day (QID) | ORAL | Status: AC | PRN

## 2011-06-18 MED ORDER — AZITHROMYCIN 250 MG PO TABS: 250.0000 mg | ORAL_TABLET | Freq: Every day | ORAL | Status: AC

## 2011-06-18 NOTE — ED Notes (Signed)
Bilateral ear pain since Thursday.  No drainage from ear.  No known fever.  Some dizziness.  No LOC.  Pt having pain over eyes with tearing and cold.  Some nasal congestion.

## 2011-06-18 NOTE — ED Provider Notes (Signed)
History     CSN: [id number] Arrival date & time: 06/18/2011 10:38 AM   First MD Initiated Contact with Patient 06/18/11 1127      Chief Complaint  Patient presents with  . Otalgia    (Consider location/radiation/quality/duration/timing/severity/associated sxs/prior treatment) HPI Comments: Here four days ago for same.  Given amox for om, not getting better.  Continues with ear pain.  Patient is a 45 y.o. female presenting with ear pain. The history is provided by the patient.  Otalgia This is a new problem. The current episode started more than 2 days ago. There is pain in both ears. The problem occurs constantly. The problem has not changed since onset.There has been no fever. The pain is severe. Associated symptoms include cough. Pertinent negatives include no ear discharge, no hearing loss and no sore throat.    Past Medical History  Diagnosis Date  . Migraine   . GERD (gastroesophageal reflux disease)   . Lung nodules     Past Surgical History  Procedure Date  . Cesarean section   . Mandible surgery     History reviewed. No pertinent family history.  History  Substance Use Topics  . Smoking status: Never Smoker   . Smokeless tobacco: Not on file  . Alcohol Use: No    OB History    Grav Para Term Preterm Abortions TAB SAB Ect Mult Living   5 5       1        Review of Systems  HENT: Positive for ear pain. Negative for hearing loss, sore throat and ear discharge.   Respiratory: Positive for cough.   All other systems reviewed and are negative.    Allergies  Review of patient's allergies indicates no known allergies.  Home Medications   Current Outpatient Rx  Name Route Sig Dispense Refill  . IBUPROFEN 200 MG PO TABS Oral Take 400 mg by mouth every 4 (four) hours as needed.      . AMOXICILLIN 500 MG PO CAPS Oral Take 1 capsule (500 mg total) by mouth 3 (three) times daily. 21 capsule 0  . TOPIRAMATE 50 MG PO TABS Oral Take 50 mg by mouth 2 (two) times  daily.        BP 128/71  Pulse 74  Temp(Src) 98.5 F (36.9 C) (Oral)  Resp 16  Ht 5\' 7"  (1.702 m)  Wt 195 lb (88.451 kg)  BMI 30.54 kg/m2  SpO2 100%  LMP 05/16/2011  Physical Exam  Nursing note and vitals reviewed. Constitutional: She is oriented to person, place, and time. She appears well-developed and well-nourished. No distress.  HENT:  Head: Normocephalic and atraumatic.       There is fluid behind both ear drums which are both moderately erythematous.    Neck: Normal range of motion. Neck supple.  Cardiovascular: Normal rate and regular rhythm.  Exam reveals no gallop and no friction rub.   No murmur heard. Pulmonary/Chest: Effort normal and breath sounds normal. No respiratory distress. She has no wheezes.  Abdominal: Soft. Bowel sounds are normal. She exhibits no distension. There is no tenderness.  Musculoskeletal: Normal range of motion.  Neurological: She is alert and oriented to person, place, and time.  Skin: Skin is warm and dry. She is not diaphoretic.    ED Course  Procedures (including critical care time)  Labs Reviewed - No data to display No results found.   No diagnosis found.    MDM  No better with amox.  Will  switch to zmax, pain meds.  To f/u prn.        Geoffery Lyons, MD 06/18/11 1136

## 2011-06-19 ENCOUNTER — Emergency Department (HOSPITAL_BASED_OUTPATIENT_CLINIC_OR_DEPARTMENT_OTHER)
Admission: EM | Admit: 2011-06-19 | Discharge: 2011-06-19 | Disposition: A | Discharge status: Home / Self Care | Payer: 59 | Attending: Emergency Medicine | Admitting: Emergency Medicine

## 2011-06-19 ENCOUNTER — Other Ambulatory Visit (INDEPENDENT_AMBULATORY_CARE_PROVIDER_SITE_OTHER): Payer: Self-pay | Admitting: Otolaryngology

## 2011-06-19 ENCOUNTER — Encounter (HOSPITAL_BASED_OUTPATIENT_CLINIC_OR_DEPARTMENT_OTHER): Payer: Self-pay | Admitting: *Deleted

## 2011-06-19 DIAGNOSIS — H60399 Other infective otitis externa, unspecified ear: Secondary | ICD-10-CM | POA: Insufficient documentation

## 2011-06-19 DIAGNOSIS — H9209 Otalgia, unspecified ear: Secondary | ICD-10-CM | POA: Insufficient documentation

## 2011-06-19 DIAGNOSIS — H6690 Otitis media, unspecified, unspecified ear: Secondary | ICD-10-CM | POA: Insufficient documentation

## 2011-06-19 DIAGNOSIS — M26609 Unspecified temporomandibular joint disorder, unspecified side: Secondary | ICD-10-CM

## 2011-06-19 DIAGNOSIS — K219 Gastro-esophageal reflux disease without esophagitis: Secondary | ICD-10-CM | POA: Insufficient documentation

## 2011-06-19 DIAGNOSIS — H6090 Unspecified otitis externa, unspecified ear: Secondary | ICD-10-CM

## 2011-06-19 MED ORDER — ANTIPYRINE-BENZOCAINE 5.4-1.4 % OT SOLN: 3.0000 [drp] | OTIC | Status: AC | PRN

## 2011-06-19 MED ORDER — ANTIPYRINE-BENZOCAINE 5.4-1.4 % OT SOLN
OTIC | Status: AC
  Filled 2011-06-19: qty 10

## 2011-06-19 MED ORDER — CIPROFLOXACIN-DEXAMETHASONE 0.3-0.1 % OT SUSP
4.0000 [drp] | Freq: Once | OTIC | Status: AC
  Administered 2011-06-19: 4 [drp] via OTIC
  Filled 2011-06-19: qty 7.5

## 2011-06-19 MED ORDER — ANTIPYRINE-BENZOCAINE 5.4-1.4 % OT SOLN
3.0000 [drp] | Freq: Once | OTIC | Status: AC
  Administered 2011-06-19: 4 [drp] via OTIC

## 2011-06-19 MED ORDER — CIPROFLOXACIN-DEXAMETHASONE 0.3-0.1 % OT SUSP: 4.0000 [drp] | Freq: Two times a day (BID) | OTIC | Status: AC

## 2011-06-19 MED ORDER — HYDROMORPHONE HCL PF 1 MG/ML IJ SOLN
1.0000 mg | Freq: Once | INTRAMUSCULAR | Status: AC
  Administered 2011-06-19: 1 mg via INTRAMUSCULAR
  Filled 2011-06-19: qty 1

## 2011-06-19 NOTE — ED Notes (Signed)
Pt was seen lst PM for same sx states that RX has been ineffective for relieving her pain

## 2011-06-19 NOTE — ED Provider Notes (Signed)
History     CSN: [id number] Arrival date & time: 06/19/2011  1:46 AM   First MD Initiated Contact with Patient 06/19/11 0135      Chief Complaint  Patient presents with  . Otalgia    (Consider location/radiation/quality/duration/timing/severity/associated sxs/prior treatment) Patient is a 45 y.o. female presenting with ear pain. The history is provided by the patient.  Otalgia This is a recurrent problem. Episode onset: 4 days ago. There is pain in both ears. The problem occurs constantly. The problem has not changed since onset.There has been no fever. The pain is severe. Pertinent negatives include no ear discharge, no headaches, no hearing loss, no rhinorrhea, no sore throat, no abdominal pain, no diarrhea, no vomiting, no neck pain, no cough and no rash. Her past medical history does not include chronic ear infection, hearing loss or tympanostomy tube.   Patient has been seen 3 times now for this pain. She's been prescribed antibiotics as well as Vicodin which did not help at home. She has no history of similar pain. It hurts to touch her ears. She describes pain is sharp and shooting. No radiation. No fevers. No cough or sore throat. No recent travel. No recent swimming or aquatics. No ear drainage. Some eye matting. No eye pain or change in vision. Past Medical History  Diagnosis Date  . Migraine   . GERD (gastroesophageal reflux disease)   . Lung nodules     Past Surgical History  Procedure Date  . Cesarean section   . Mandible surgery     History reviewed. No pertinent family history.  History  Substance Use Topics  . Smoking status: Never Smoker   . Smokeless tobacco: Not on file  . Alcohol Use: No    OB History    Grav Para Term Preterm Abortions TAB SAB Ect Mult Living   5 5       1        Review of Systems  Constitutional: Negative for fever and chills.  HENT: Positive for ear pain. Negative for hearing loss, sore throat, rhinorrhea, neck pain, neck  stiffness and ear discharge.   Eyes: Negative for pain.  Respiratory: Negative for cough and shortness of breath.   Cardiovascular: Negative for chest pain, palpitations and leg swelling.  Gastrointestinal: Negative for vomiting, abdominal pain and diarrhea.  Genitourinary: Negative for dysuria.  Musculoskeletal: Negative for back pain.  Skin: Negative for rash.  Neurological: Negative for headaches.  All other systems reviewed and are negative.    Allergies  Review of patient's allergies indicates no known allergies.  Home Medications   Current Outpatient Rx  Name Route Sig Dispense Refill  . PHENYLEPHRINE HCL 10 MG PO TABS Oral Take 10 mg by mouth every 4 (four) hours as needed.      . AMOXICILLIN 500 MG PO CAPS Oral Take 1 capsule (500 mg total) by mouth 3 (three) times daily. 21 capsule 0  . AZITHROMYCIN 250 MG PO TABS Oral Take 1 tablet (250 mg total) by mouth daily. Take first 2 tablets together, then 1 every day until finished. 6 tablet 0  . HYDROCODONE-ACETAMINOPHEN 5-500 MG PO TABS Oral Take 1-2 tablets by mouth every 6 (six) hours as needed for pain. 15 tablet 0  . IBUPROFEN 200 MG PO TABS Oral Take 400 mg by mouth every 4 (four) hours as needed.      . TOPIRAMATE 50 MG PO TABS Oral Take 50 mg by mouth 2 (two) times daily.  BP 121/69  Pulse 69  Temp(Src) 99.6 F (37.6 C) (Oral)  Resp 22  SpO2 100%  LMP 05/16/2011  Physical Exam  Constitutional: She is oriented to person, place, and time. She appears well-developed and well-nourished.  HENT:  Head: Normocephalic and atraumatic.  Nose: Nose normal.  Mouth/Throat: No oropharyngeal exudate.       Both right and left TMs with small effusions. No erythema. There is tenderness to the external auricles right greater than left. Canals clear.  Eyes: Conjunctivae and EOM are normal. Pupils are equal, round, and reactive to light.  Neck: Trachea normal. Neck supple. No thyromegaly present.  Cardiovascular: Normal  rate, regular rhythm, S1 normal, S2 normal and normal pulses.     No systolic murmur is present   No diastolic murmur is present  Pulses:      Radial pulses are 2+ on the right side, and 2+ on the left side.  Pulmonary/Chest: Effort normal and breath sounds normal. She has no wheezes. She has no rhonchi. She has no rales. She exhibits no tenderness.  Abdominal: Soft. Normal appearance and bowel sounds are normal. There is no tenderness. There is no CVA tenderness and negative Murphy's sign.  Musculoskeletal:       BLE:s Calves nontender, no cords or erythema, negative Homans sign  Neurological: She is alert and oriented to person, place, and time. She has normal strength. No cranial nerve deficit or sensory deficit. GCS eye subscore is 4. GCS verbal subscore is 5. GCS motor subscore is 6.  Skin: Skin is warm and dry. No rash noted. She is not diaphoretic.  Psychiatric: Her speech is normal.       Cooperative and appropriate    ED Course  Procedures (including critical care time)  Ear drops and intramuscular pain medications provided.     MDM  Your pain with some effusions and clinical otitis externa, being treated for otitis media.  Plan ear drops to treat for otitis externa as well and ENT referral provided.         Sunnie Nielsen, MD 06/19/11 [phone]

## 2011-06-20 ENCOUNTER — Ambulatory Visit
Admission: RE | Admit: 2011-06-20 | Discharge: 2011-06-20 | Disposition: A | Discharge status: Home / Self Care | Payer: 59 | Source: Ambulatory Visit | Attending: Otolaryngology | Admitting: Otolaryngology

## 2011-06-20 DIAGNOSIS — M26609 Unspecified temporomandibular joint disorder, unspecified side: Secondary | ICD-10-CM

## 2011-11-28 DIAGNOSIS — E559 Vitamin D deficiency, unspecified: Secondary | ICD-10-CM | POA: Insufficient documentation

## 2011-12-27 ENCOUNTER — Encounter (HOSPITAL_BASED_OUTPATIENT_CLINIC_OR_DEPARTMENT_OTHER): Payer: Self-pay | Admitting: Family Medicine

## 2011-12-27 ENCOUNTER — Emergency Department (HOSPITAL_BASED_OUTPATIENT_CLINIC_OR_DEPARTMENT_OTHER)
Admission: EM | Admit: 2011-12-27 | Discharge: 2011-12-27 | Disposition: A | Discharge status: Home / Self Care | Payer: 59 | Attending: Emergency Medicine | Admitting: Emergency Medicine

## 2011-12-27 ENCOUNTER — Emergency Department (HOSPITAL_BASED_OUTPATIENT_CLINIC_OR_DEPARTMENT_OTHER): Payer: 59

## 2011-12-27 DIAGNOSIS — Z79899 Other long term (current) drug therapy: Secondary | ICD-10-CM | POA: Insufficient documentation

## 2011-12-27 DIAGNOSIS — K219 Gastro-esophageal reflux disease without esophagitis: Secondary | ICD-10-CM | POA: Insufficient documentation

## 2011-12-27 DIAGNOSIS — R197 Diarrhea, unspecified: Secondary | ICD-10-CM | POA: Insufficient documentation

## 2011-12-27 DIAGNOSIS — R110 Nausea: Secondary | ICD-10-CM | POA: Insufficient documentation

## 2011-12-27 DIAGNOSIS — R079 Chest pain, unspecified: Secondary | ICD-10-CM

## 2011-12-27 LAB — BASIC METABOLIC PANEL
CO2: 19 mEq/L (ref 19–32)
Calcium: 8.9 mg/dL (ref 8.4–10.5)
Sodium: 140 mEq/L (ref 135–145)

## 2011-12-27 LAB — CBC
MCV: 83.8 fL (ref 78.0–100.0)
Platelets: 269 10*3/uL (ref 150–400)
RDW: 13 % (ref 11.5–15.5)
WBC: 4.4 10*3/uL (ref 4.0–10.5)

## 2011-12-27 LAB — DIFFERENTIAL
Basophils Absolute: 0 10*3/uL (ref 0.0–0.1)
Eosinophils Relative: 0 % (ref 0–5)
Lymphocytes Relative: 13 % (ref 12–46)
Neutro Abs: 3.4 10*3/uL (ref 1.7–7.7)

## 2011-12-27 LAB — TROPONIN I: Troponin I: <0.3 ng/mL (ref <0.30)

## 2011-12-27 MED ORDER — LOPERAMIDE HCL 2 MG PO CAPS: 2.0000 mg | ORAL_CAPSULE | Freq: Four times a day (QID) | ORAL | Status: AC | PRN

## 2011-12-27 MED ORDER — GI COCKTAIL ~~LOC~~
30.0000 mL | Freq: Once | ORAL | Status: AC
  Administered 2011-12-27: 30 mL via ORAL
  Filled 2011-12-27: qty 30

## 2011-12-27 MED ORDER — ACETAMINOPHEN 325 MG PO TABS
975.0000 mg | ORAL_TABLET | Freq: Once | ORAL | Status: AC
  Administered 2011-12-27: 975 mg via ORAL
  Filled 2011-12-27: qty 3

## 2011-12-27 MED ORDER — NAPROXEN 375 MG PO TABS: 375.0000 mg | ORAL_TABLET | Freq: Two times a day (BID) | ORAL | Status: DC

## 2011-12-27 MED ORDER — OMEPRAZOLE 20 MG PO CPDR: 20.0000 mg | DELAYED_RELEASE_CAPSULE | Freq: Every day | ORAL | Status: DC

## 2011-12-27 MED ORDER — ONDANSETRON 4 MG PO TBDP
4.0000 mg | ORAL_TABLET | Freq: Once | ORAL | Status: AC
  Administered 2011-12-27: 4 mg via ORAL
  Filled 2011-12-27: qty 1

## 2011-12-27 NOTE — Discharge Instructions (Signed)
Take omeprazole daily for two weeks and naprosyn as directed. Follow up with your primary care provider in 1-2 weeks for recheck of ongoing chest discomfort but return to ER for changing or worsening of symptoms.  Take imodium as needed for diarrhea and stay well hydrated. Follow a bland diet for diarrhea.  Diarrhea Infections caused by germs (bacterial) or a virus commonly cause diarrhea. Your caregiver has determined that with time, rest and fluids, the diarrhea should improve. In general, eat normally while drinking more water than usual. Although water may prevent dehydration, it does not contain salt and minerals (electrolytes). Broths, weak tea without caffeine and oral rehydration solutions (ORS) replace fluids and electrolytes. Small amounts of fluids should be taken frequently. Large amounts at one time may not be tolerated. Plain water may be harmful in infants and the elderly. Oral rehydrating solutions (ORS) are available at pharmacies and grocery stores. ORS replace water and important electrolytes in proper proportions. Sports drinks are not as effective as ORS and may be harmful due to sugars worsening diarrhea.  ORS is especially recommended for use in children with diarrhea. As a general guideline for children, replace any new fluid losses from diarrhea and/or vomiting with ORS as follows:   If your child weighs 22 pounds or under (10 kg or less), give 60-120 mL ( -  cup or 2 - 4 ounces) of ORS for each episode of diarrheal stool or vomiting episode.   If your child weighs more than 22 pounds (more than 10 kgs), give 120-240 mL ( - 1 cup or 4 - 8 ounces) of ORS for each diarrheal stool or episode of vomiting.   While correcting for dehydration, children should eat normally. However, foods high in sugar should be avoided because this may worsen diarrhea. Large amounts of carbonated soft drinks, juice, gelatin desserts and other highly sugared drinks should be avoided.   After  correction of dehydration, other liquids that are appealing to the child may be added. Children should drink small amounts of fluids frequently and fluids should be increased as tolerated. Children should drink enough fluids to keep urine clear or pale yellow.   Adults should eat normally while drinking more fluids than usual. Drink small amounts of fluids frequently and increase as tolerated. Drink enough fluids to keep urine clear or pale yellow. Broths, weak decaffeinated tea, lemon lime soft drinks (allowed to go flat) and ORS replace fluids and electrolytes.   Avoid:   Carbonated drinks.   Juice.   Extremely hot or cold fluids.   Caffeine drinks.   Fatty, greasy foods.   Alcohol.   Tobacco.   Too much intake of anything at one time.   Gelatin desserts.   Probiotics are active cultures of beneficial bacteria. They may lessen the amount and number of diarrheal stools in adults. Probiotics can be found in yogurt with active cultures and in supplements.   Wash hands well to avoid spreading bacteria and virus.   Anti-diarrheal medications are not recommended for infants and children.   Only take over-the-counter or prescription medicines for pain, discomfort or fever as directed by your caregiver. Do not give aspirin to children because it may cause Reye's Syndrome.   For adults, ask your caregiver if you should continue all prescribed and over-the-counter medicines.   If your caregiver has given you a follow-up appointment, it is very important to keep that appointment. Not keeping the appointment could result in a chronic or permanent injury, and disability. If  there is any problem keeping the appointment, you must call back to this facility for assistance.  SEEK IMMEDIATE MEDICAL CARE IF:   You or your child is unable to keep fluids down or other symptoms or problems become worse in spite of treatment.   Vomiting or diarrhea develops and becomes persistent.   There is  vomiting of blood or bile (green material).   There is blood in the stool or the stools are black and tarry.   There is no urine output in 6-8 hours or there is only a small amount of very dark urine.   Abdominal pain develops, increases or localizes.   You have a fever.   Your baby is older than 3 months with a rectal temperature of 102 F (38.9 C) or higher.   Your baby is 36 months old or younger with a rectal temperature of 100.4 F (38 C) or higher.   You or your child develops excessive weakness, dizziness, fainting or extreme thirst.   You or your child develops a rash, stiff neck, severe headache or become irritable or sleepy and difficult to awaken.  MAKE SURE YOU:   Understand these instructions.   Will watch your condition.   Will get help right away if you are not doing well or get worse.  Document Released: 06/30/2002 Document Revised: 06/29/2011 Document Reviewed: 05/17/2009 [facility] Patient Information 2012 [place], [place].  Diet for GERD or PUD Nutrition therapy can help ease the discomfort of gastroesophageal reflux disease (GERD) and peptic ulcer disease (PUD).  HOME CARE INSTRUCTIONS   Eat your meals slowly, in a relaxed setting.   Eat 5 to 6 small meals per day.   If a food causes distress, stop eating it for a period of time.  FOODS TO AVOID  Coffee, regular or decaffeinated.   Cola beverages, regular or low calorie.   Tea, regular or decaffeinated.   Pepper.   Cocoa.   High fat foods, including meats.   Butter, margarine, hydrogenated oil (trans fats).   Peppermint or spearmint (if you have GERD).   Fruits and vegetables if not tolerated.   Alcohol.   Nicotine (smoking or chewing). This is one of the most potent stimulants to acid production in the gastrointestinal tract.   Any food that seems to aggravate your condition.  If you have questions regarding your diet, ask your caregiver or a registered dietitian. TIPS  Lying flat may  make symptoms worse. Keep the head of your bed raised 6 to 9 inches (15 to 23 cm) by using a foam wedge or blocks under the legs of the bed.   Do not lay down until 3 hours after eating a meal.   Daily physical activity may help reduce symptoms.  MAKE SURE YOU:   Understand these instructions.   Will watch your condition.   Will get help right away if you are not doing well or get worse.  Document Released: 07/10/2005 Document Revised: 06/29/2011 Document Reviewed: 05/26/2011 [facility] Patient Information 2012 [place], [place].

## 2011-12-27 NOTE — ED Notes (Addendum)
Pt c/o central chest pain "comes and goes" for a couple of months. Pt sts pain is sharp, intermittent and worse with movement. Pt sts she had ekg at [facility] 1 month ago for same. Pt reports "some burning like indigestion" with h/o gerd. Pt also c/o diarrhea since last night.

## 2011-12-27 NOTE — ED Provider Notes (Signed)
History     CSN: [id number]  Arrival date & time 12/27/11  1253   First MD Initiated Contact with Patient 12/27/11 1306      Chief Complaint  Patient presents with  . Chest Pain  . Gastrophageal Reflux  . Diarrhea  . Nausea    (Consider location/radiation/quality/duration/timing/severity/associated sxs/prior treatment) HPI  Patient presents to emergency department complaining of a one month history of intermittent chest discomfort. 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 Patient denies any associated fevers, chills, cough, hemoptysis, diaphoresis, or abdominal pain. Patient states that she saw her primary care provider at [facility] about a month ago for the same chest pain and "had a lot of tests done that showed nothing." Patient is complaining that she had return of chest discomfort that began last night and has been constant since onset. Patient states pain is in her substernal region. She denies aggravating or alleviating factors. She's taken nothing for symptoms prior to arrival. She's had multiple hours of constant discomfort. Patient is also complaining of acute onset loose stool that began last night with multiple loose stools throughout the night and throughout the day today. She denies fevers, vomiting, or abdominal pain. She denies known sick contacts. Patient states she has history of past reflux, migraines, and history of a lung nodule. Patient takes daily Topamax for her migraines, as needed Sudafed, as needed ibuprofen, and vitamin D for a mild vitamin D deficiency. She denies alcohol, tobacco, or drug use. She denies family history of early heart disease or heart attack.  Past Medical History  Diagnosis Date  .  Migraine   . GERD (gastroesophageal reflux disease)   . Lung nodules     Past Surgical History  Procedure Date  . Cesarean section   . Mandible surgery     No family history on file.  History  Substance Use Topics  . Smoking status: Never Smoker   . Smokeless tobacco: Not on file  . Alcohol Use: No    OB History    Grav Para Term Preterm Abortions TAB SAB Ect Mult Living   5 5       1        Review of Systems  All other systems reviewed and are negative.    Allergies  Review of patient's allergies indicates no known allergies.  Home Medications   Current Outpatient Rx  Name Route Sig Dispense Refill  . VITAMIN D 1000 UNITS PO TABS Oral Take 1,000 Units by mouth daily.    . ERGOCALCIFEROL 50000 UNITS PO CAPS Oral Take 50,000 Units by mouth once a week.    . IBUPROFEN 200 MG PO TABS Oral Take 400 mg by mouth every 4 (four) hours as needed.      Marland Kitchen PHENYLEPHRINE HCL 10 MG PO TABS Oral Take 10 mg by mouth every 4 (four) hours as needed.      . TOPIRAMATE 50 MG PO TABS Oral Take 50 mg by mouth 2 (two) times daily.        BP 121/97  Pulse 84  Temp(Src) 99.2 F (37.3 C) (Oral)  Resp 16  Ht 5\' 7"  (1.702 m)  Wt 207 lb (93.895 kg)  BMI 32.42 kg/m2  SpO2  99%  LMP 12/22/2011  Physical Exam  Nursing note and vitals reviewed. Constitutional: She is oriented to person, place, and time. She appears well-developed and well-nourished. No distress.  HENT:  Head: Normocephalic and atraumatic.  Eyes: Conjunctivae are normal.  Neck: Normal range of motion. Neck supple.  Cardiovascular: Normal rate, regular rhythm, normal heart sounds and intact distal pulses.  Exam reveals no gallop and no friction rub.   No murmur heard. Pulmonary/Chest: Effort normal and breath sounds normal. No respiratory distress. She has no wheezes. She has no rales. She exhibits no tenderness.  Abdominal: Soft. Bowel sounds are normal. She exhibits no distension and no mass. There is no tenderness.  There is no rebound and no guarding.  Musculoskeletal: Normal range of motion. She exhibits no edema and no tenderness.  Neurological: She is alert and oriented to person, place, and time.  Skin: Skin is warm and dry. No rash noted. She is not diaphoretic. No erythema.  Psychiatric: She has a normal mood and affect.    ED Course  Procedures (including critical care time)  ODT zofran, GI cocktail and PO tylenol   Date: 12/27/2011  Rate: 82  Rhythm: normal sinus rhythm  QRS Axis: normal  Intervals: normal  ST/T Wave abnormalities: normal  Conduction Disutrbances: none  Narrative Interpretation: non provocative compared to May 03, 2011  Old EKG Reviewed: No significant changes noted   Labs Reviewed  DIFFERENTIAL - Abnormal; Notable for the following:    Neutrophils Relative 78 (*)    Lymphs Abs 0.6 (*)    All other components within normal limits  BASIC METABOLIC PANEL - Abnormal; Notable for the following:    GFR calc non Af Amer 75 (*)    GFR calc Af Amer 87 (*)    All other components within normal limits  CBC  TROPONIN I   Dg Chest 2 View  12/27/2011  *RADIOLOGY REPORT*  Clinical Data: Left-sided chest pain intermittently for 1 month  CHEST - 2 VIEW  Comparison: Chest x-ray of 05/03/2011 and CT chest of the same date  Findings: No active infiltrate or effusion is seen.  Mediastinal contours are stable.  No adenopathy is seen.  The heart is within normal limits in size.  No bony abnormality is seen.  IMPRESSION: No active lung disease.  Original Report Authenticated By: Juline Patch, M.D.     1. Chest pain   2. Diarrhea   3. GERD (gastroesophageal reflux disease)       MDM  Low risk for PE and ACS. PERC negative. Constant chest discomfort for multiple hours with normal EKG and negative troponin. Atypical for ACS but hx more consistent with GERD or chest wall discomfort.  diarrhrea without hx of recent abx use and abdomen soft and non tender. No recent travel.  Afebrile. Non toxic appearing. Tolerating fluids well.         [place], [place] 12/27/11 1424

## 2011-12-28 NOTE — ED Provider Notes (Signed)
Medical screening examination/treatment/procedure(s) were performed by non-physician practitioner and as supervising physician I was immediately available for consultation/collaboration.  Robt Okuda, MD 12/28/11 0803 

## 2012-02-17 ENCOUNTER — Emergency Department (HOSPITAL_BASED_OUTPATIENT_CLINIC_OR_DEPARTMENT_OTHER)
Admission: EM | Admit: 2012-02-17 | Discharge: 2012-02-17 | Disposition: A | Discharge status: Home / Self Care | Payer: 59 | Attending: Emergency Medicine | Admitting: Emergency Medicine

## 2012-02-17 ENCOUNTER — Encounter (HOSPITAL_BASED_OUTPATIENT_CLINIC_OR_DEPARTMENT_OTHER): Payer: Self-pay | Admitting: *Deleted

## 2012-02-17 DIAGNOSIS — K219 Gastro-esophageal reflux disease without esophagitis: Secondary | ICD-10-CM | POA: Insufficient documentation

## 2012-02-17 DIAGNOSIS — R51 Headache: Secondary | ICD-10-CM

## 2012-02-17 DIAGNOSIS — G43909 Migraine, unspecified, not intractable, without status migrainosus: Secondary | ICD-10-CM | POA: Insufficient documentation

## 2012-02-17 MED ORDER — PROMETHAZINE HCL 25 MG/ML IJ SOLN
25.0000 mg | Freq: Once | INTRAMUSCULAR | Status: AC
  Administered 2012-02-17: 25 mg via INTRAMUSCULAR
  Filled 2012-02-17: qty 1

## 2012-02-17 MED ORDER — DEXAMETHASONE SODIUM PHOSPHATE 10 MG/ML IJ SOLN
10.0000 mg | Freq: Once | INTRAMUSCULAR | Status: AC
  Administered 2012-02-17: 10 mg via INTRAMUSCULAR
  Filled 2012-02-17: qty 1

## 2012-02-17 MED ORDER — ONDANSETRON 4 MG PO TBDP
4.0000 mg | ORAL_TABLET | Freq: Once | ORAL | Status: AC
  Administered 2012-02-17: 4 mg via ORAL
  Filled 2012-02-17: qty 1

## 2012-02-17 MED ORDER — DIPHENHYDRAMINE HCL 50 MG/ML IJ SOLN
25.0000 mg | Freq: Once | INTRAMUSCULAR | Status: AC
  Administered 2012-02-17: 25 mg via INTRAMUSCULAR
  Filled 2012-02-17: qty 1

## 2012-02-17 MED ORDER — KETOROLAC TROMETHAMINE 60 MG/2ML IM SOLN
60.0000 mg | Freq: Once | INTRAMUSCULAR | Status: AC
  Administered 2012-02-17: 60 mg via INTRAMUSCULAR
  Filled 2012-02-17: qty 2

## 2012-02-17 MED ORDER — HYDROMORPHONE HCL PF 1 MG/ML IJ SOLN
1.0000 mg | Freq: Once | INTRAMUSCULAR | Status: AC
Start: 2012-02-17 — End: 2012-02-17
  Administered 2012-02-17: 1 mg via INTRAMUSCULAR
  Filled 2012-02-17: qty 1

## 2012-02-17 NOTE — ED Notes (Signed)
C/o migraine with nausea and light sensitivity since 0700- hx of same

## 2012-02-17 NOTE — ED Provider Notes (Addendum)
History     CSN: [id number]  Arrival date & time 02/17/12  1358   First MD Initiated Contact with Patient 02/17/12 1438      Chief Complaint  Patient presents with  . Migraine    (Consider location/radiation/quality/duration/timing/severity/associated sxs/prior treatment) HPI Comments: Patient presents with a headache that began this morning.  It is similar to her past migraine headaches.  Patient notes some light sensitivity and some mild nausea with it.  This is typical for her with her headaches.  She is on her daily Topamax which is significantly helped her frequency of migraines.  She states her last migraine was approximately 5 months ago.  She did try some Tylenol at home without good relief.  Patient denies any fevers or neck stiffness.  She did feel that she might have a cold that she noted some sinus pressure.  Patient is a 46 y.o. female presenting with migraine. The history is provided by the patient.  Migraine Associated symptoms include headaches. Pertinent negatives include no chest pain, no abdominal pain and no shortness of breath.    Past Medical History  Diagnosis Date  . Migraine   . GERD (gastroesophageal reflux disease)   . Lung nodules     Past Surgical History  Procedure Date  . Cesarean section   . Mandible surgery   . Tubal ligation     History reviewed. No pertinent family history.  History  Substance Use Topics  . Smoking status: Never Smoker   . Smokeless tobacco: Never Used  . Alcohol Use: No    OB History    Grav Para Term Preterm Abortions TAB SAB Ect Mult Living   5 5       1        Review of Systems  Constitutional: Negative.  Negative for fever and chills.  Eyes: Positive for photophobia. Negative for discharge and redness.  Respiratory: Negative.  Negative for cough and shortness of breath.   Cardiovascular: Negative.  Negative for chest pain.  Gastrointestinal: Positive for nausea. Negative for vomiting, abdominal pain and  diarrhea.  Genitourinary: Negative.   Musculoskeletal: Negative.  Negative for back pain.  Skin: Negative.  Negative for color change and rash.  Neurological: Positive for headaches. Negative for syncope.  Hematological: Negative.  Negative for adenopathy.  Psychiatric/Behavioral: Negative.  Negative for confusion.  All other systems reviewed and are negative.    Allergies  Review of patient's allergies indicates no known allergies.  Home Medications   Current Outpatient Rx  Name Route Sig Dispense Refill  . ACETAMINOPHEN 325 MG PO TABS Oral Take 650 mg by mouth every 6 (six) hours as needed.    Marland Kitchen VITAMIN D 1000 UNITS PO TABS Oral Take 1,000 Units by mouth daily.    . ERGOCALCIFEROL 50000 UNITS PO CAPS Oral Take 50,000 Units by mouth once a week.    . IBUPROFEN 200 MG PO TABS Oral Take 400 mg by mouth every 4 (four) hours as needed.      . TOPIRAMATE 50 MG PO TABS Oral Take 50 mg by mouth 2 (two) times daily.      Marland Kitchen NAPROXEN 375 MG PO TABS Oral Take 1 tablet (375 mg total) by mouth 2 (two) times daily. 20 tablet 0  . OMEPRAZOLE 20 MG PO CPDR Oral Take 1 capsule (20 mg total) by mouth daily. 14 capsule 0  . PHENYLEPHRINE HCL 10 MG PO TABS Oral Take 10 mg by mouth every 4 (four) hours as needed.  BP 118/69  Pulse 64  Temp 98.2 F (36.8 C) (Oral)  Resp 18  Ht 5\' 7"  (1.702 m)  Wt 195 lb (88.451 kg)  BMI 30.54 kg/m2  SpO2 99%  LMP 01/18/2012  Physical Exam  Vitals reviewed. Constitutional: She is oriented to person, place, and time. She appears well-developed and well-nourished.  Non-toxic appearance. She does not have a sickly appearance.       Patient is lying in a dark room with sunglasses on.  HENT:  Head: Normocephalic and atraumatic.  Eyes: Conjunctivae, EOM and lids are normal. Pupils are equal, round, and reactive to light. No scleral icterus.  Neck: Trachea normal and normal range of motion. Neck supple.  Cardiovascular: Normal rate, regular rhythm and normal  heart sounds.  Exam reveals no gallop.   No murmur heard. Pulmonary/Chest: Effort normal and breath sounds normal. No respiratory distress. She has no wheezes. She has no rales.  Abdominal: Soft. Normal appearance. There is no tenderness. There is no rebound, no guarding and no CVA tenderness.  Musculoskeletal: Normal range of motion.  Neurological: She is alert and oriented to person, place, and time. She has normal strength.  Skin: Skin is warm, dry and intact. No rash noted.  Psychiatric: She has a normal mood and affect. Her behavior is normal. Judgment and thought content normal.    ED Course  Procedures (including critical care time)  Labs Reviewed - No data to display No results found.   No diagnosis found.    MDM  Patient with headaches similar to her past headaches.  She has no fevers or neurologic deficits to suggest meningitis at this time.  Was not sudden in onset to suggest subarachnoid hemorrhage.  Patient will be treated with Toradol, Phenergan and Benadryl as well as Decadron for her headache.  I will reassess her after those interventions are completed.        Nat Christen, MD 02/17/12 1515  Pt reassessed and has some improvement in her headache but still needs some more relief.  Will give dilaudid.  Signed out to [facility].    Nat Christen, MD 02/17/12 [phone]

## 2012-03-19 ENCOUNTER — Encounter (HOSPITAL_BASED_OUTPATIENT_CLINIC_OR_DEPARTMENT_OTHER): Payer: Self-pay | Admitting: Emergency Medicine

## 2012-03-19 ENCOUNTER — Emergency Department (HOSPITAL_BASED_OUTPATIENT_CLINIC_OR_DEPARTMENT_OTHER)
Admission: EM | Admit: 2012-03-19 | Discharge: 2012-03-19 | Disposition: A | Discharge status: Home / Self Care | Payer: 59 | Attending: Emergency Medicine | Admitting: Emergency Medicine

## 2012-03-19 ENCOUNTER — Emergency Department (HOSPITAL_BASED_OUTPATIENT_CLINIC_OR_DEPARTMENT_OTHER): Payer: 59

## 2012-03-19 DIAGNOSIS — R51 Headache: Secondary | ICD-10-CM | POA: Insufficient documentation

## 2012-03-19 DIAGNOSIS — R42 Dizziness and giddiness: Secondary | ICD-10-CM | POA: Insufficient documentation

## 2012-03-19 DIAGNOSIS — R110 Nausea: Secondary | ICD-10-CM | POA: Insufficient documentation

## 2012-03-19 LAB — CBC WITH DIFFERENTIAL/PLATELET
Basophils Absolute: 0 10*3/uL (ref 0.0–0.1)
Basophils Relative: 0 % (ref 0–1)
Eosinophils Absolute: 0 10*3/uL (ref 0.0–0.7)
MCH: 27.4 pg (ref 26.0–34.0)
MCHC: 32.3 g/dL (ref 30.0–36.0)
Neutrophils Relative %: 56 % (ref 43–77)
Platelets: 256 10*3/uL (ref 150–400)

## 2012-03-19 LAB — POCT I-STAT, CHEM 8
HCT: 40 % (ref 36.0–46.0)
Hemoglobin: 13.6 g/dL (ref 12.0–15.0)
Potassium: 3.9 mEq/L (ref 3.5–5.1)
Sodium: 144 mEq/L (ref 135–145)

## 2012-03-19 MED ORDER — SODIUM CHLORIDE 0.9 % IV BOLUS (SEPSIS)
1000.0000 mL | Freq: Once | INTRAVENOUS | Status: AC
  Administered 2012-03-19: 1000 mL via INTRAVENOUS

## 2012-03-19 MED ORDER — METOCLOPRAMIDE HCL 5 MG/ML IJ SOLN
10.0000 mg | Freq: Once | INTRAMUSCULAR | Status: AC
  Administered 2012-03-19: 10 mg via INTRAVENOUS
  Filled 2012-03-19: qty 2

## 2012-03-19 MED ORDER — MECLIZINE HCL 12.5 MG PO TABS: 25.0000 mg | ORAL_TABLET | Freq: Three times a day (TID) | ORAL | Status: AC | PRN

## 2012-03-19 MED ORDER — MECLIZINE HCL 25 MG PO TABS
25.0000 mg | ORAL_TABLET | Freq: Once | ORAL | Status: AC
  Administered 2012-03-19: 25 mg via ORAL
  Filled 2012-03-19 (×2): qty 1

## 2012-03-19 MED ORDER — ACETAMINOPHEN 325 MG PO TABS
650.0000 mg | ORAL_TABLET | Freq: Once | ORAL | Status: AC
  Administered 2012-03-19: 650 mg via ORAL
  Filled 2012-03-19: qty 2

## 2012-03-19 MED ORDER — DIPHENHYDRAMINE HCL 50 MG/ML IJ SOLN
25.0000 mg | Freq: Once | INTRAMUSCULAR | Status: AC
  Administered 2012-03-19: 25 mg via INTRAVENOUS
  Filled 2012-03-19: qty 1

## 2012-03-19 MED ORDER — MECLIZINE HCL 25 MG PO TABS
25.0000 mg | ORAL_TABLET | Freq: Once | ORAL | Status: AC
  Administered 2012-03-19: 25 mg via ORAL
  Filled 2012-03-19: qty 1

## 2012-03-19 NOTE — ED Provider Notes (Signed)
History     CSN: [id number]  Arrival date & time 03/19/12  6295   First MD Initiated Contact with Patient 03/19/12 1053      Chief Complaint  Patient presents with  . Headache  . Nausea    (Consider location/radiation/quality/duration/timing/severity/associated sxs/prior treatment) The history is provided by the patient.  Monique Tyler is a 46 y.o. female hx of migraines here with vertigo. Woke up around 1am and went to the bathroom. She felt that the room was spinning at the time. No lightheadedness or syncope. Felt nauseous but didn't vomit. Able to ambulate without falling. No abdominal pain and no hx of dizziness. She also has some headache that is typical of her migraines. No weakness or numbness.      Past Medical History  Diagnosis Date  . Migraine   . GERD (gastroesophageal reflux disease)   . Lung nodules     Past Surgical History  Procedure Date  . Cesarean section   . Mandible surgery   . Tubal ligation     History reviewed. No pertinent family history.  History  Substance Use Topics  . Smoking status: Never Smoker   . Smokeless tobacco: Never Used  . Alcohol Use: No    OB History    Grav Para Term Preterm Abortions TAB SAB Ect Mult Living   5 5       1        Review of Systems  Gastrointestinal: Positive for nausea.  Neurological: Positive for dizziness and headaches.  All other systems reviewed and are negative.    Allergies  Review of patient's allergies indicates no known allergies.  Home Medications   Current Outpatient Rx  Name Route Sig Dispense Refill  . ACETAMINOPHEN 325 MG PO TABS Oral Take 650 mg by mouth every 6 (six) hours as needed. For headache.    Marland Kitchen VITAMIN D 1000 UNITS PO TABS Oral Take 1,000 Units by mouth daily.    . ERGOCALCIFEROL 50000 UNITS PO CAPS Oral Take 50,000 Units by mouth once a week.    . TOPIRAMATE 50 MG PO TABS Oral Take 50 mg by mouth 2 (two) times daily.      Marland Kitchen PAPAYA 100 MG PO TABS Oral Take 1  tablet by mouth daily.      BP 137/74  Pulse 67  Temp 98 F (36.7 C) (Oral)  Resp 18  Ht 5\' 7"  (1.702 m)  Wt 195 lb (88.451 kg)  BMI 30.54 kg/m2  SpO2 99%  LMP 01/18/2012  Physical Exam  Nursing note and vitals reviewed. Constitutional: She is oriented to person, place, and time. She appears well-developed and well-nourished.       NAD  HENT:  Head: Normocephalic.  Right Ear: External ear normal.  Left Ear: External ear normal.       Bilateral TM clear.  Eyes: Conjunctivae are normal. Pupils are equal, round, and reactive to light.       + horizontal nystagmus to the L side. No rotatory or vertigo nystagmus.   Neck: Normal range of motion. Neck supple.  Cardiovascular: Normal rate, regular rhythm and normal heart sounds.   Pulmonary/Chest: Effort normal and breath sounds normal.  Abdominal: Soft. Bowel sounds are normal.  Musculoskeletal: Normal range of motion.  Neurological: She is alert and oriented to person, place, and time. No cranial nerve deficit.       NL finger to nose. No pronator drift. Neg rhomberg. Able to walk without falling to the side.  Skin: Skin is warm and dry.  Psychiatric: She has a normal mood and affect. Her behavior is normal. Judgment and thought content normal.    ED Course  Procedures (including critical care time)  Labs Reviewed  POCT I-STAT, CHEM 8 - Abnormal; Notable for the following:    Chloride 113 (*)     All other components within normal limits  CBC WITH DIFFERENTIAL   Ct Head Wo Contrast  03/19/2012  *RADIOLOGY REPORT*  Clinical Data: Headache and nausea  CT HEAD WITHOUT CONTRAST  Technique:  Contiguous axial images were obtained from the base of the skull through the vertex without contrast.  Comparison: 02/18/2010  Findings: Ventricle size is normal.  Negative for infarct or mass. Negative for hemorrhage.  Normal appearance of the brain and calvarium.  Small air-fluid level right maxillary sinus.  IMPRESSION: Normal CT of the  brain.  Small air-fluid level right maxillary sinus.   Original Report Authenticated By: Camelia Phenes, M.D.      1. Vertigo       MDM  Monique Tyler is a 46 y.o. female hx of migraines here with HA and vertigo. She is likely to have peripheral vertigo given unidirectional horizontal nystagmus and normal neuro exam. Will give meclizine and reglan and reassess.   2:49 PM I reassessed patient. She is feeling better. No nystagmus. Able to ambulate normally. Feels tired from the meds. CBC, chem 8 nl. CT head showed no acute intracranial bleeds or stroke. Will d/c home on meclizine, return if symptoms worsen or if she has weakness.        Richardean Canal, MD 03/19/12 1450

## 2012-03-19 NOTE — ED Notes (Signed)
Pt report headache and nausea starting this morning at 5am. Pt states room spinning at time.

## 2012-10-08 ENCOUNTER — Emergency Department (HOSPITAL_BASED_OUTPATIENT_CLINIC_OR_DEPARTMENT_OTHER): Payer: BC Managed Care – PPO

## 2012-10-08 ENCOUNTER — Encounter (HOSPITAL_BASED_OUTPATIENT_CLINIC_OR_DEPARTMENT_OTHER): Payer: Self-pay | Admitting: Emergency Medicine

## 2012-10-08 ENCOUNTER — Emergency Department (HOSPITAL_BASED_OUTPATIENT_CLINIC_OR_DEPARTMENT_OTHER)
Admission: EM | Admit: 2012-10-08 | Discharge: 2012-10-08 | Disposition: A | Discharge status: Home / Self Care | Payer: BC Managed Care – PPO | Attending: Emergency Medicine | Admitting: Emergency Medicine

## 2012-10-08 DIAGNOSIS — Z8709 Personal history of other diseases of the respiratory system: Secondary | ICD-10-CM | POA: Insufficient documentation

## 2012-10-08 DIAGNOSIS — M25519 Pain in unspecified shoulder: Secondary | ICD-10-CM | POA: Insufficient documentation

## 2012-10-08 DIAGNOSIS — G43909 Migraine, unspecified, not intractable, without status migrainosus: Secondary | ICD-10-CM | POA: Insufficient documentation

## 2012-10-08 DIAGNOSIS — M25511 Pain in right shoulder: Secondary | ICD-10-CM

## 2012-10-08 DIAGNOSIS — IMO0001 Reserved for inherently not codable concepts without codable children: Secondary | ICD-10-CM | POA: Insufficient documentation

## 2012-10-08 DIAGNOSIS — R5381 Other malaise: Secondary | ICD-10-CM | POA: Insufficient documentation

## 2012-10-08 DIAGNOSIS — M542 Cervicalgia: Secondary | ICD-10-CM | POA: Insufficient documentation

## 2012-10-08 DIAGNOSIS — R52 Pain, unspecified: Secondary | ICD-10-CM | POA: Insufficient documentation

## 2012-10-08 DIAGNOSIS — R209 Unspecified disturbances of skin sensation: Secondary | ICD-10-CM | POA: Insufficient documentation

## 2012-10-08 DIAGNOSIS — Z79899 Other long term (current) drug therapy: Secondary | ICD-10-CM | POA: Insufficient documentation

## 2012-10-08 DIAGNOSIS — Z8719 Personal history of other diseases of the digestive system: Secondary | ICD-10-CM | POA: Insufficient documentation

## 2012-10-08 MED ORDER — CYCLOBENZAPRINE HCL 10 MG PO TABS: 10.0000 mg | ORAL_TABLET | Freq: Two times a day (BID) | ORAL | Status: DC | PRN

## 2012-10-08 MED ORDER — IBUPROFEN 800 MG PO TABS: 800.0000 mg | ORAL_TABLET | Freq: Three times a day (TID) | ORAL | Status: DC

## 2012-10-08 NOTE — ED Provider Notes (Signed)
Medical screening examination/treatment/procedure(s) were performed by non-physician practitioner and as supervising physician I was immediately available for consultation/collaboration.   Nadir Vasques H Maureen Duesing, MD 10/08/12 2032 

## 2012-10-08 NOTE — ED Notes (Signed)
Pt states last night pain on right side of neck and right shoulder woke her up. Pt states when she moves right shoulder it "pops."

## 2012-10-08 NOTE — ED Notes (Signed)
Patient states that she has worsening pain in her right shoulder that started last night. Pt states that "it is hard to move it".
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
## 2012-12-29 ENCOUNTER — Encounter (HOSPITAL_COMMUNITY): Payer: Self-pay | Admitting: Emergency Medicine

## 2012-12-29 ENCOUNTER — Emergency Department (HOSPITAL_COMMUNITY)
Admission: EM | Admit: 2012-12-29 | Discharge: 2012-12-29 | Disposition: A | Discharge status: Home / Self Care | Payer: BC Managed Care – PPO | Attending: Emergency Medicine | Admitting: Emergency Medicine

## 2012-12-29 DIAGNOSIS — Z8709 Personal history of other diseases of the respiratory system: Secondary | ICD-10-CM | POA: Insufficient documentation

## 2012-12-29 DIAGNOSIS — Z79899 Other long term (current) drug therapy: Secondary | ICD-10-CM | POA: Insufficient documentation

## 2012-12-29 DIAGNOSIS — M542 Cervicalgia: Secondary | ICD-10-CM | POA: Insufficient documentation

## 2012-12-29 DIAGNOSIS — R51 Headache: Secondary | ICD-10-CM | POA: Insufficient documentation

## 2012-12-29 DIAGNOSIS — Z8719 Personal history of other diseases of the digestive system: Secondary | ICD-10-CM | POA: Insufficient documentation

## 2012-12-29 MED ORDER — KETOROLAC TROMETHAMINE 60 MG/2ML IM SOLN
60.0000 mg | Freq: Once | INTRAMUSCULAR | Status: AC
  Administered 2012-12-29: 60 mg via INTRAMUSCULAR
  Filled 2012-12-29: qty 2

## 2012-12-29 MED ORDER — HYDROMORPHONE HCL PF 1 MG/ML IJ SOLN
1.0000 mg | Freq: Once | INTRAMUSCULAR | Status: AC
  Administered 2012-12-29: 1 mg via INTRAMUSCULAR
  Filled 2012-12-29: qty 1

## 2012-12-29 MED ORDER — OXYCODONE-ACETAMINOPHEN 5-325 MG PO TABS: 1.0000 | ORAL_TABLET | ORAL | Status: DC | PRN

## 2012-12-29 MED ORDER — CYCLOBENZAPRINE HCL 10 MG PO TABS: 10.0000 mg | ORAL_TABLET | Freq: Two times a day (BID) | ORAL | Status: DC | PRN

## 2012-12-29 NOTE — ED Provider Notes (Signed)
History     CSN: [id number]  Arrival date & time 12/29/12  1320   First MD Initiated Contact with Patient 12/29/12 1333      Chief Complaint  Patient presents with  . Migraine    (Consider location/radiation/quality/duration/timing/severity/associated sxs/prior treatment) Patient is a 47 y.o. female presenting with headaches. The history is provided by the patient. No language interpreter was used.  Headache Pain location:  L parietal and R parietal Quality:  Dull Associated symptoms: neck pain   Associated symptoms: no abdominal pain, no fever, no nausea, no numbness and no vomiting   Associated symptoms comment:  Pain that started in the midline neck and traveled to parietal area causing a feeling of facial flushing and tingling into arms bilaterally without weakness. No nausea. She reports mildly blurred vision, but that the visual disturbance has been several days in duration without change. She has a history of migraines but states that this headache is different than usual pattern of migraine pain. No N, V.   Past Medical History  Diagnosis Date  . Migraine   . GERD (gastroesophageal reflux disease)   . Lung nodules     Past Surgical History  Procedure Laterality Date  . Cesarean section    . Mandible surgery    . Tubal ligation      No family history on file.  History  Substance Use Topics  . Smoking status: Never Smoker   . Smokeless tobacco: Never Used  . Alcohol Use: No    OB History   Grav Para Term Preterm Abortions TAB SAB Ect Mult Living   5 5       1        Review of Systems  Constitutional: Negative for fever.  HENT: Positive for neck pain.   Respiratory: Negative for shortness of breath.   Cardiovascular: Negative for chest pain.  Gastrointestinal: Negative for nausea, vomiting and abdominal pain.  Neurological: Positive for headaches. Negative for weakness and numbness.  Psychiatric/Behavioral: Negative for confusion.    Allergies   Shellfish allergy and Pork-derived products  Home Medications   Current Outpatient Rx  Name  Route  Sig  Dispense  Refill  . ibuprofen (ADVIL,MOTRIN) 800 MG tablet   Oral   Take 1 tablet (800 mg total) by mouth 3 (three) times daily.   21 tablet   0     BP 144/80  Pulse 87  Temp(Src) 99.5 F (37.5 C)  Resp 18  Ht 5\' 7"  (1.702 m)  Wt 210 lb (95.255 kg)  BMI 32.88 kg/m2  SpO2 99%  Physical Exam  Constitutional: She is oriented to person, place, and time. She appears well-developed and well-nourished.  HENT:  Head: Normocephalic.  Eyes: Pupils are equal, round, and reactive to light.  Neck: Normal range of motion. Neck supple.  Cardiovascular: Normal rate and regular rhythm.   Pulmonary/Chest: Effort normal and breath sounds normal.  Abdominal: Soft. Bowel sounds are normal. There is no tenderness. There is no rebound and no guarding.  Musculoskeletal: Normal range of motion.  No midline cervical tenderness. FROM of neck. Mildly tender paracervical muscles.  Neurological: She is alert and oriented to person, place, and time. She has normal strength and normal reflexes. No sensory deficit. She displays a negative Romberg sign. Coordination normal.  Ambulatory without gait disturbance or imbalance. Cranial nerves 3-12 grossly intact.   Skin: Skin is warm and dry. No rash noted.  Psychiatric: She has a normal mood and affect.    ED  Course  Procedures (including critical care time)  Labs Reviewed - No data to display No results found.   No diagnosis found.  1. Neck pain 2. Headache   MDM  Nonfocal neurologic exam and symptoms atypical for intracranial bleed or stroke. Feel headache is likely secondary to neck pain. Headache/neck pain no better with Toradol. IM Dilaudid given. Dr. Fredderick Phenix in to see patient. Stable for discharge home.         Arnoldo Hooker, PA-C 12/29/12 1642

## 2012-12-29 NOTE — ED Notes (Signed)
Pt states that she has a headache since church today, hx of migrianes. Alert x4, no n/v

## 2012-12-29 NOTE — ED Provider Notes (Signed)
Pt with headache.  Has hx of migraines, but today's headache is somewhat different.  Had pain in neck, shooting up into head.  No n/v.  No photophobia.  Neuro exam intact.  Has some reproducible pain to neck, with hx of neck problems in the past.  Sitting up in bed, smiling, drinking.  Doubt SAH, meningitis.  No CVA symptoms.  Will tx symptoms.  Do not feel that CT imaging/LP is indicated.  Rolan Bucco, MD 12/29/12 1452

## 2013-01-01 NOTE — ED Provider Notes (Signed)
Medical screening examination/treatment/procedure(s) were conducted as a shared visit with non-physician practitioner(s) and myself.  I personally evaluated the patient during the encounter   Rolan Bucco, MD 01/01/13 1503

## 2013-01-05 ENCOUNTER — Emergency Department (HOSPITAL_BASED_OUTPATIENT_CLINIC_OR_DEPARTMENT_OTHER): Payer: BC Managed Care – PPO

## 2013-01-05 ENCOUNTER — Encounter (HOSPITAL_BASED_OUTPATIENT_CLINIC_OR_DEPARTMENT_OTHER): Payer: Self-pay | Admitting: Emergency Medicine

## 2013-01-05 ENCOUNTER — Emergency Department (HOSPITAL_BASED_OUTPATIENT_CLINIC_OR_DEPARTMENT_OTHER)
Admission: EM | Admit: 2013-01-05 | Discharge: 2013-01-05 | Disposition: A | Discharge status: Home / Self Care | Payer: BC Managed Care – PPO | Attending: Emergency Medicine | Admitting: Emergency Medicine

## 2013-01-05 DIAGNOSIS — Z79899 Other long term (current) drug therapy: Secondary | ICD-10-CM | POA: Insufficient documentation

## 2013-01-05 DIAGNOSIS — Z8719 Personal history of other diseases of the digestive system: Secondary | ICD-10-CM | POA: Insufficient documentation

## 2013-01-05 DIAGNOSIS — R0789 Other chest pain: Secondary | ICD-10-CM | POA: Insufficient documentation

## 2013-01-05 DIAGNOSIS — R209 Unspecified disturbances of skin sensation: Secondary | ICD-10-CM | POA: Insufficient documentation

## 2013-01-05 DIAGNOSIS — G43909 Migraine, unspecified, not intractable, without status migrainosus: Secondary | ICD-10-CM | POA: Insufficient documentation

## 2013-01-05 DIAGNOSIS — R51 Headache: Secondary | ICD-10-CM | POA: Insufficient documentation

## 2013-01-05 DIAGNOSIS — Z8709 Personal history of other diseases of the respiratory system: Secondary | ICD-10-CM | POA: Insufficient documentation

## 2013-01-05 DIAGNOSIS — R200 Anesthesia of skin: Secondary | ICD-10-CM

## 2013-01-05 LAB — COMPREHENSIVE METABOLIC PANEL
AST: 23 U/L (ref 0–37)
Albumin: 3.7 g/dL (ref 3.5–5.2)
Chloride: 105 mEq/L (ref 96–112)
Creatinine, Ser: 0.9 mg/dL (ref 0.50–1.10)
Potassium: 4 mEq/L (ref 3.5–5.1)
Total Bilirubin: 0.2 mg/dL — ABNORMAL LOW (ref 0.3–1.2)

## 2013-01-05 LAB — CBC WITH DIFFERENTIAL/PLATELET
Eosinophils Absolute: 0.1 10*3/uL (ref 0.0–0.7)
Eosinophils Relative: 2 % (ref 0–5)
Lymphs Abs: 1.5 10*3/uL (ref 0.7–4.0)
MCH: 27.7 pg (ref 26.0–34.0)
MCV: 85.5 fL (ref 78.0–100.0)
Monocytes Absolute: 0.5 10*3/uL (ref 0.1–1.0)
Monocytes Relative: 11 % (ref 3–12)
Platelets: 283 10*3/uL (ref 150–400)
RBC: 4.76 MIL/uL (ref 3.87–5.11)

## 2013-01-05 LAB — TROPONIN I: Troponin I: <0.3 ng/mL (ref <0.30)

## 2013-01-05 MED ORDER — KETOROLAC TROMETHAMINE 30 MG/ML IJ SOLN
30.0000 mg | Freq: Once | INTRAMUSCULAR | Status: AC
  Administered 2013-01-05: 30 mg via INTRAVENOUS
  Filled 2013-01-05: qty 1

## 2013-01-05 MED ORDER — OXYCODONE-ACETAMINOPHEN 5-325 MG PO TABS: 2.0000 | ORAL_TABLET | ORAL | Status: DC | PRN

## 2013-01-05 MED ORDER — PROMETHAZINE HCL 25 MG/ML IJ SOLN
12.5000 mg | Freq: Once | INTRAMUSCULAR | Status: AC
  Administered 2013-01-05: 12.5 mg via INTRAVENOUS
  Filled 2013-01-05: qty 1

## 2013-01-05 NOTE — ED Provider Notes (Signed)
History     CSN: [id number]  Arrival date & time 01/05/13  4696   First MD Initiated Contact with Patient 01/05/13 1038      Chief Complaint  Patient presents with  . Chest Pain  . Numbness    (Consider location/radiation/quality/duration/timing/severity/associated sxs/prior treatment) HPI Comments: Patient with history of migraines.  Presents with complaints of chest discomfort, headaches, and "numbness" in the left arm and leg for one week.  She was seen last week for the same at [facility] and was discharged.  She is not feeling any better despite percocet and flexeril.  Patient is a 47 y.o. female presenting with chest pain. The history is provided by the patient.  Chest Pain Pain location:  Substernal area Pain quality: tightness   Pain radiates to:  Does not radiate Pain radiates to the back: no   Pain severity:  Moderate Onset quality:  Gradual Duration:  7 days Timing:  Constant Progression:  Worsening Chronicity:  Recurrent Relieved by:  Nothing Worsened by:  Nothing tried Ineffective treatments:  None tried   Past Medical History  Diagnosis Date  . Migraine   . GERD (gastroesophageal reflux disease)   . Lung nodules     Past Surgical History  Procedure Laterality Date  . Cesarean section    . Mandible surgery    . Tubal ligation      No family history on file.  History  Substance Use Topics  . Smoking status: Never Smoker   . Smokeless tobacco: Never Used  . Alcohol Use: No    OB History   Grav Para Term Preterm Abortions TAB SAB Ect Mult Living   5 5       1        Review of Systems  Cardiovascular: Positive for chest pain.  All other systems reviewed and are negative.    Allergies  Shellfish allergy and Pork-derived products  Home Medications   Current Outpatient Rx  Name  Route  Sig  Dispense  Refill  . cyclobenzaprine (FLEXERIL) 10 MG tablet   Oral   Take 1 tablet (10 mg total) by mouth 2 (two) times daily as needed for muscle  spasms.   20 tablet   0   . ibuprofen (ADVIL,MOTRIN) 800 MG tablet   Oral   Take 1 tablet (800 mg total) by mouth 3 (three) times daily.   21 tablet   0   . oxyCODONE-acetaminophen (PERCOCET/ROXICET) 5-325 MG per tablet   Oral   Take 1 tablet by mouth every 4 (four) hours as needed for pain.   15 tablet   0     BP 176/75  Pulse 98  Temp(Src) 98.8 F (37.1 C) (Oral)  Resp 16  Ht 5\' 7"  (1.702 m)  Wt 210 lb (95.255 kg)  BMI 32.88 kg/m2  SpO2 99%  Physical Exam  Nursing note and vitals reviewed. Constitutional: She is oriented to person, place, and time. She appears well-developed and well-nourished. No distress.  HENT:  Head: Normocephalic and atraumatic.  Mouth/Throat: Oropharynx is clear and moist.  Eyes: EOM are normal. Pupils are equal, round, and reactive to light.  Neck: Normal range of motion. Neck supple.  Cardiovascular: Normal rate and regular rhythm.  Exam reveals no gallop and no friction rub.   No murmur heard. Pulmonary/Chest: Effort normal and breath sounds normal. No respiratory distress. She has no wheezes.  Abdominal: Soft. Bowel sounds are normal. She exhibits no distension. There is no tenderness.  Musculoskeletal: Normal range  of motion.  Neurological: She is alert and oriented to person, place, and time. No cranial nerve deficit. She exhibits normal muscle tone. Coordination normal.  Skin: Skin is warm and dry. She is not diaphoretic.    ED Course  Procedures (including critical care time)  Labs Reviewed  CBC WITH DIFFERENTIAL  COMPREHENSIVE METABOLIC PANEL  TROPONIN I   No results found.   No diagnosis found.   Date: 01/05/2013  Rate: 84  Rhythm: normal sinus rhythm  QRS Axis: normal  Intervals: normal  ST/T Wave abnormalities: normal  Conduction Disutrbances:none  Narrative Interpretation:   Old EKG Reviewed: none available    MDM  The workup reveals a normal head ct and cardiac workup is unremarkable.  I am uncertain as to  the exact etiolgy of her symptoms, however nothing emergent has turned up.  She needs to follow up with her pcp or neurologist to discuss.  Will discharge to home, return prn.          Geoffery Lyons, MD 01/05/13 [phone]

## 2013-01-05 NOTE — ED Notes (Signed)
Pt having chest pain, left arm numbness and headache x one week.  Seen at [facility] long for same last Sunday.  Pt states chest pain is worse since last night.  Some sob on exertion. Some nausea, some back pain.

## 2013-05-28 ENCOUNTER — Emergency Department (HOSPITAL_COMMUNITY)
Admission: EM | Admit: 2013-05-28 | Discharge: 2013-05-28 | Disposition: A | Discharge status: Home / Self Care | Payer: BC Managed Care – PPO | Attending: Emergency Medicine | Admitting: Emergency Medicine

## 2013-05-28 ENCOUNTER — Encounter (HOSPITAL_COMMUNITY): Payer: Self-pay | Admitting: Emergency Medicine

## 2013-05-28 ENCOUNTER — Emergency Department (HOSPITAL_COMMUNITY): Payer: BC Managed Care – PPO

## 2013-05-28 DIAGNOSIS — Z791 Long term (current) use of non-steroidal anti-inflammatories (NSAID): Secondary | ICD-10-CM | POA: Insufficient documentation

## 2013-05-28 DIAGNOSIS — N12 Tubulo-interstitial nephritis, not specified as acute or chronic: Secondary | ICD-10-CM | POA: Insufficient documentation

## 2013-05-28 DIAGNOSIS — I10 Essential (primary) hypertension: Secondary | ICD-10-CM | POA: Insufficient documentation

## 2013-05-28 DIAGNOSIS — IMO0002 Reserved for concepts with insufficient information to code with codable children: Secondary | ICD-10-CM | POA: Insufficient documentation

## 2013-05-28 DIAGNOSIS — Z79899 Other long term (current) drug therapy: Secondary | ICD-10-CM | POA: Insufficient documentation

## 2013-05-28 DIAGNOSIS — R071 Chest pain on breathing: Secondary | ICD-10-CM | POA: Insufficient documentation

## 2013-05-28 DIAGNOSIS — R0789 Other chest pain: Secondary | ICD-10-CM

## 2013-05-28 DIAGNOSIS — R002 Palpitations: Secondary | ICD-10-CM | POA: Insufficient documentation

## 2013-05-28 DIAGNOSIS — Z3202 Encounter for pregnancy test, result negative: Secondary | ICD-10-CM | POA: Insufficient documentation

## 2013-05-28 DIAGNOSIS — Z8719 Personal history of other diseases of the digestive system: Secondary | ICD-10-CM | POA: Insufficient documentation

## 2013-05-28 DIAGNOSIS — G43909 Migraine, unspecified, not intractable, without status migrainosus: Secondary | ICD-10-CM | POA: Insufficient documentation

## 2013-05-28 LAB — CBC WITH DIFFERENTIAL/PLATELET
Basophils Absolute: 0 10*3/uL (ref 0.0–0.1)
Basophils Relative: 0 % (ref 0–1)
Eosinophils Absolute: 0.1 10*3/uL (ref 0.0–0.7)
Eosinophils Relative: 1 % (ref 0–5)
HCT: 39.8 % (ref 36.0–46.0)
Lymphocytes Relative: 30 % (ref 12–46)
MCHC: 31.7 g/dL (ref 30.0–36.0)
MCV: 85.2 fL (ref 78.0–100.0)
Monocytes Absolute: 0.4 10*3/uL (ref 0.1–1.0)
RDW: 13.3 % (ref 11.5–15.5)

## 2013-05-28 LAB — POCT I-STAT TROPONIN I: Troponin i, poc: 0 ng/mL (ref 0.00–0.08)

## 2013-05-28 LAB — URINALYSIS, ROUTINE W REFLEX MICROSCOPIC
Bilirubin Urine: NEGATIVE
Ketones, ur: NEGATIVE mg/dL
Nitrite: NEGATIVE
Protein, ur: NEGATIVE mg/dL
Urobilinogen, UA: 0.2 mg/dL (ref 0.0–1.0)
pH: 6.5 (ref 5.0–8.0)

## 2013-05-28 LAB — URINE MICROSCOPIC-ADD ON

## 2013-05-28 LAB — COMPREHENSIVE METABOLIC PANEL
ALT: 21 U/L (ref 0–35)
AST: 19 U/L (ref 0–37)
CO2: 22 mEq/L (ref 19–32)
Calcium: 10 mg/dL (ref 8.4–10.5)
Creatinine, Ser: 0.99 mg/dL (ref 0.50–1.10)
GFR calc non Af Amer: 66 mL/min — ABNORMAL LOW (ref >90)
Total Protein: 7.5 g/dL (ref 6.0–8.3)

## 2013-05-28 MED ORDER — CIPROFLOXACIN HCL 500 MG PO TABS: 500.0000 mg | ORAL_TABLET | Freq: Two times a day (BID) | ORAL | Status: DC

## 2013-05-28 MED ORDER — HYDROCODONE-ACETAMINOPHEN 5-325 MG PO TABS: 1.0000 | ORAL_TABLET | Freq: Four times a day (QID) | ORAL | Status: DC | PRN

## 2013-05-28 MED ORDER — IBUPROFEN 800 MG PO TABS: 800.0000 mg | ORAL_TABLET | Freq: Three times a day (TID) | ORAL | Status: DC | PRN

## 2013-05-28 MED ORDER — MORPHINE SULFATE 4 MG/ML IJ SOLN
4.0000 mg | Freq: Once | INTRAMUSCULAR | Status: AC
  Administered 2013-05-28: 4 mg via INTRAVENOUS
  Filled 2013-05-28: qty 1

## 2013-05-28 MED ORDER — HYDROMORPHONE HCL PF 1 MG/ML IJ SOLN
1.0000 mg | Freq: Once | INTRAMUSCULAR | Status: AC
  Administered 2013-05-28: 1 mg via INTRAVENOUS
  Filled 2013-05-28: qty 1

## 2013-05-28 MED ORDER — PREDNISONE 20 MG PO TABS: 40.0000 mg | ORAL_TABLET | Freq: Every day | ORAL | Status: DC

## 2013-05-28 NOTE — ED Provider Notes (Signed)
CSN: [id number]     Arrival date & time 05/28/13  1459 History   First MD Initiated Contact with Patient 05/28/13 1526     Chief Complaint  Patient presents with  . Chest Pain  . Back Pain   (Consider location/radiation/quality/duration/timing/severity/associated sxs/prior Treatment) HPI Comments: Patient is a 47 year old female past medical history significant for migraines, GERD, hypertension presenting to the emergency department for 2 complaints. Patient's first complaint is 4 and a half hours of left-sided chest pressure without radiation. She states that her chest pain is worsened with inspiration and expiration and sitting down. Patient states her pain is improved with standing up. Patient states she is having associated palpitations. Patient states she's been having palpitations for over a week prior to onset of chest pain. She was evaluated by her PCP who sent her for an echocardiogram, she has not received her results she did not go to the followup appointment. Patient denies ever having a stress test or cardiac catheterization done. Patient's second complaint is left-sided flank pain with radiation to in the left lower abdomen. She describes this pain as sharp and constant. She states she has associated dysuria, frequency, urgency. Patient states she has a history of recurrent urinary tract infections and feels is one patient denies any shortness of breath cold cough, nausea, vomiting, diaphoresis, and lateral leg swelling. PERC negative.   PCP is Dr. Cloyd Stagers Bonsu Patient is a 47 y.o. female presenting with chest pain and back pain.  Chest Pain Associated symptoms: abdominal pain, back pain and palpitations   Associated symptoms: no cough, no fever, no headache and no shortness of breath   Back Pain Associated symptoms: abdominal pain, chest pain and dysuria   Associated symptoms: no fever and no headaches     Past Medical History  Diagnosis Date  . Migraine   . GERD  (gastroesophageal reflux disease)   . Lung nodules    Past Surgical History  Procedure Laterality Date  . Cesarean section    . Mandible surgery    . Tubal ligation     History reviewed. No pertinent family history. History  Substance Use Topics  . Smoking status: Never Smoker   . Smokeless tobacco: Never Used  . Alcohol Use: No   OB History   Grav Para Term Preterm Abortions TAB SAB Ect Mult Living   5 5       1       Review of Systems  Constitutional: Negative for fever and chills.  Respiratory: Positive for chest tightness. Negative for cough and shortness of breath.   Cardiovascular: Positive for chest pain and palpitations. Negative for leg swelling.  Gastrointestinal: Positive for abdominal pain.  Genitourinary: Positive for dysuria, urgency, frequency and flank pain.  Musculoskeletal: Positive for back pain.  Neurological: Negative for syncope, light-headedness and headaches.  All other systems reviewed and are negative.    Allergies  Shellfish allergy and Pork-derived products  Home Medications   Current Outpatient Rx  Name  Route  Sig  Dispense  Refill  . butalbital-acetaminophen-caffeine (FIORICET WITH CODEINE) 50-325-40-30 MG per capsule   Oral   Take 1 capsule by mouth every 4 (four) hours as needed for headache or migraine.         Marland Kitchen ibuprofen (ADVIL,MOTRIN) 800 MG tablet   Oral   Take 1 tablet (800 mg total) by mouth 3 (three) times daily.   21 tablet   0   . topiramate (TOPAMAX) 50 MG tablet   Oral  Take 50 mg by mouth 2 (two) times daily.         . ciprofloxacin (CIPRO) 500 MG tablet   Oral   Take 1 tablet (500 mg total) by mouth every 12 (twelve) hours.   20 tablet   0   . HYDROcodone-acetaminophen (NORCO/VICODIN) 5-325 MG per tablet   Oral   Take 1-2 tablets by mouth every 6 (six) hours as needed.   12 tablet   0   . ibuprofen (ADVIL,MOTRIN) 800 MG tablet   Oral   Take 1 tablet (800 mg total) by mouth every 8 (eight) hours as  needed for mild pain or moderate pain.   21 tablet   0   . predniSONE (DELTASONE) 20 MG tablet   Oral   Take 2 tablets (40 mg total) by mouth daily.   10 tablet   0    BP 119/67  Pulse 60  Temp(Src) 98.7 F (37.1 C) (Oral)  Resp 18  SpO2 99% Physical Exam  Constitutional: She is oriented to person, place, and time. She appears well-developed and well-nourished. No distress.  HENT:  Head: Normocephalic and atraumatic.  Right Ear: External ear normal.  Left Ear: External ear normal.  Nose: Nose normal.  Mouth/Throat: Oropharynx is clear and moist.  Eyes: Conjunctivae and EOM are normal. Pupils are equal, round, and reactive to light.  Neck: Normal range of motion. Neck supple.  Cardiovascular: Normal rate, regular rhythm, normal heart sounds and intact distal pulses.   Pulmonary/Chest: Effort normal and breath sounds normal. No respiratory distress. She exhibits tenderness.    Reproducible chest pain   Abdominal: Soft. Bowel sounds are normal. She exhibits no distension. There is no tenderness. There is CVA tenderness (mild left sided cva). There is no rigidity, no rebound and no guarding.  Musculoskeletal: Normal range of motion. She exhibits no edema and no tenderness.       Cervical back: She exhibits normal range of motion and no bony tenderness.       Thoracic back: She exhibits normal range of motion, no bony tenderness, no swelling, no edema and no deformity.       Lumbar back: She exhibits normal range of motion, no bony tenderness, no swelling, no edema and no deformity.       Back:  Neurological: She is alert and oriented to person, place, and time.  Skin: Skin is warm and dry. No rash noted. She is not diaphoretic.  Psychiatric: She has a normal mood and affect.    ED Course  Procedures (including critical care time) Labs Review Labs Reviewed  COMPREHENSIVE METABOLIC PANEL - Abnormal; Notable for the following:    Alkaline Phosphatase 132 (*)    Total  Bilirubin 0.2 (*)    GFR calc non Af Amer 66 (*)    GFR calc Af Amer 76 (*)    All other components within normal limits  URINALYSIS, ROUTINE W REFLEX MICROSCOPIC - Abnormal; Notable for the following:    APPearance TURBID (*)    Hgb urine dipstick TRACE (*)    Leukocytes, UA LARGE (*)    All other components within normal limits  URINE MICROSCOPIC-ADD ON - Abnormal; Notable for the following:    Squamous Epithelial / LPF MANY (*)    Bacteria, UA MANY (*)    All other components within normal limits  URINE CULTURE  CBC WITH DIFFERENTIAL  PREGNANCY, URINE  LIPASE, BLOOD  POCT I-STAT TROPONIN I  POCT I-STAT TROPONIN I  Imaging Review Dg Chest 2 View  05/28/2013   CLINICAL DATA:  Chest pain  EXAM: CHEST  2 VIEW  COMPARISON:  01/05/2013  FINDINGS: The heart size and mediastinal contours are within normal limits. Both lungs are clear. The visualized skeletal structures are unremarkable.  IMPRESSION: No active cardiopulmonary disease.   Electronically Signed   By: Ruel Favors M.D.   On: 05/28/2013 15:46    EKG Interpretation     Ventricular Rate:  73 PR Interval:  162 QRS Duration: 75 QT Interval:  390 QTC Calculation: 430 R Axis:   64 Text Interpretation:  Sinus rhythm            MDM   1. Pyelonephritis   2. Left-sided chest wall pain      Afebrile, NAD, non-toxic appearing, AAOx4.   1) Pyelonephritis: Pt w/ mild left sided cva tenderness and low back muscular tenderness on palpation. Urine with large leukocytosis and many bacteria. Clinical picture for pyelonephritis will treat patient with Abx course. Back pain improved while in ED.   2) Chest wall pain: Pt w/ reproducible chest wall pain under left breast. Pain resolved while in ED. No other cardiac, pulmonary, or skin abnormalities on examination. Patient is to be discharged with recommendation to follow up with PCP in regards to today's hospital visit. Chest pain is not likely of cardiac or pulmonary etiology  d/t presentation, perc negative, VSS, no tracheal deviation, no JVD or new murmur, RRR, breath sounds equal bilaterally, EKG without acute abnormalities, delta negative troponin, and negative CXR. Pain likely chest wall pain. Anti-inflammatories will be prescribed. Pt has been advised to return to the ED is CP becomes exertional, associated with diaphoresis or nausea, radiates to left jaw/arm, worsens or becomes concerning in any way. Pt appears reliable for follow up and is agreeable to discharge.    Return precautions discussed. Patient is agreeable to plan. Patient is stable at time of discharge Case has been discussed with and seen by Dr. Blinda Leatherwood who agrees with the above plan to discharge.       Monique Ellis, PA-C 05/29/13 0004

## 2013-05-28 NOTE — Progress Notes (Signed)
Patient confirms her pcp is Dr. Julio Sicks.  System updated.

## 2013-05-28 NOTE — ED Notes (Signed)
Pt c/o l back pain x2 days and chest pain x8mins. Pt denies hx of PEs, DVTs or cardiac issues. Pt reports pain on inspiration and expiration.

## 2013-05-30 LAB — URINE CULTURE: Colony Count: >=100000

## 2013-05-31 NOTE — ED Provider Notes (Signed)
Medical screening examination/treatment/procedure(s) were performed by non-physician practitioner and as supervising physician I was immediately available for consultation/collaboration.  Gilda Crease, MD 05/31/13 [phone]

## 2013-06-15 ENCOUNTER — Encounter (HOSPITAL_BASED_OUTPATIENT_CLINIC_OR_DEPARTMENT_OTHER): Payer: Self-pay | Admitting: Emergency Medicine

## 2013-06-15 ENCOUNTER — Emergency Department (HOSPITAL_BASED_OUTPATIENT_CLINIC_OR_DEPARTMENT_OTHER)
Admission: EM | Admit: 2013-06-15 | Discharge: 2013-06-15 | Disposition: A | Discharge status: Home / Self Care | Payer: BC Managed Care – PPO | Attending: Emergency Medicine | Admitting: Emergency Medicine

## 2013-06-15 DIAGNOSIS — Z79899 Other long term (current) drug therapy: Secondary | ICD-10-CM | POA: Insufficient documentation

## 2013-06-15 DIAGNOSIS — Z8719 Personal history of other diseases of the digestive system: Secondary | ICD-10-CM | POA: Insufficient documentation

## 2013-06-15 DIAGNOSIS — G44209 Tension-type headache, unspecified, not intractable: Secondary | ICD-10-CM | POA: Insufficient documentation

## 2013-06-15 DIAGNOSIS — R300 Dysuria: Secondary | ICD-10-CM

## 2013-06-15 DIAGNOSIS — IMO0002 Reserved for concepts with insufficient information to code with codable children: Secondary | ICD-10-CM | POA: Insufficient documentation

## 2013-06-15 LAB — URINALYSIS, ROUTINE W REFLEX MICROSCOPIC
Glucose, UA: NEGATIVE mg/dL
Specific Gravity, Urine: 1.007 (ref 1.005–1.030)

## 2013-06-15 LAB — URINE MICROSCOPIC-ADD ON

## 2013-06-15 MED ORDER — TRAMADOL HCL 50 MG PO TABS: 50.0000 mg | ORAL_TABLET | Freq: Four times a day (QID) | ORAL | Status: DC | PRN

## 2013-06-15 MED ORDER — KETOROLAC TROMETHAMINE 60 MG/2ML IM SOLN
60.0000 mg | Freq: Once | INTRAMUSCULAR | Status: AC
  Administered 2013-06-15: 60 mg via INTRAMUSCULAR
  Filled 2013-06-15: qty 2

## 2013-06-15 MED ORDER — CYCLOBENZAPRINE HCL 10 MG PO TABS: 10.0000 mg | ORAL_TABLET | Freq: Two times a day (BID) | ORAL | Status: DC | PRN

## 2013-06-15 MED ORDER — CIPROFLOXACIN HCL 500 MG PO TABS: 500.0000 mg | ORAL_TABLET | Freq: Two times a day (BID) | ORAL | Status: DC

## 2013-06-15 NOTE — ED Provider Notes (Signed)
CSN: [id number]     Arrival date & time 06/15/13  2219 History  This chart was scribed for non-physician practitioner working with Leonette Most B. Bernette Mayers, MD by Leone Payor, ED Scribe. This patient was seen in room MH04/MH04 and the patient's care was started at 2219.    Chief Complaint  Patient presents with  . Migraine    The history is provided by the patient. No language interpreter was used.    HPI Comments: Monique Tyler is a 47 y.o. female who presents to the Emergency Department complaining of an intermittent, non-radiating HA that began 4 days ago. She states the HA is located to the back of the head and she describes it as throbbing and heavy. Pt states she has a history chronic migraines but states this episode is different. She states usually her migraines entail photophobia and are constant. She denies recent hair treatments. She has taken Vicodin, 800 mg ibuprofen, cipro, and prednisone which was prescribed when she was last seen on 05/28/13. She denies fever, abdominal pain, nausea, vomiting, cough, sore throat.  Past Medical History  Diagnosis Date  . Migraine   . GERD (gastroesophageal reflux disease)   . Lung nodules    Past Surgical History  Procedure Laterality Date  . Cesarean section    . Mandible surgery    . Tubal ligation     History reviewed. No pertinent family history. History  Substance Use Topics  . Smoking status: Never Smoker   . Smokeless tobacco: Never Used  . Alcohol Use: No   OB History   Grav Para Term Preterm Abortions TAB SAB Ect Mult Living   5 5       1       Review of Systems  Constitutional: Negative for fever.  HENT: Negative for sore throat.   Eyes: Negative for photophobia and visual disturbance.  Respiratory: Negative for cough.   Gastrointestinal: Negative for nausea, vomiting and abdominal pain.  Neurological: Positive for headaches.  All other systems reviewed and are negative.    Allergies  Shellfish allergy and  Pork-derived products  Home Medications   Current Outpatient Rx  Name  Route  Sig  Dispense  Refill  . butalbital-acetaminophen-caffeine (FIORICET WITH CODEINE) 50-325-40-30 MG per capsule   Oral   Take 1 capsule by mouth every 4 (four) hours as needed for headache or migraine.         . ciprofloxacin (CIPRO) 500 MG tablet   Oral   Take 1 tablet (500 mg total) by mouth every 12 (twelve) hours.   20 tablet   0   . HYDROcodone-acetaminophen (NORCO/VICODIN) 5-325 MG per tablet   Oral   Take 1-2 tablets by mouth every 6 (six) hours as needed.   12 tablet   0   . ibuprofen (ADVIL,MOTRIN) 800 MG tablet   Oral   Take 1 tablet (800 mg total) by mouth 3 (three) times daily.   21 tablet   0   . ibuprofen (ADVIL,MOTRIN) 800 MG tablet   Oral   Take 1 tablet (800 mg total) by mouth every 8 (eight) hours as needed for mild pain or moderate pain.   21 tablet   0   . predniSONE (DELTASONE) 20 MG tablet   Oral   Take 2 tablets (40 mg total) by mouth daily.   10 tablet   0   . topiramate (TOPAMAX) 50 MG tablet   Oral   Take 50 mg by mouth 2 (two) times daily.  BP 116/57  Pulse 92  Temp(Src) 98.6 F (37 C) (Oral)  Resp 18  SpO2 97% Physical Exam  Nursing note and vitals reviewed. Constitutional: She is oriented to person, place, and time. She appears well-developed and well-nourished.  HENT:  Head: Normocephalic and atraumatic.  Eyes: EOM are normal. Pupils are equal, round, and reactive to light.  Neck: Normal range of motion. Neck supple.  Reproducible tenderness to the left lateral neck into temporal scalp.  Cardiovascular: Normal rate, normal heart sounds and intact distal pulses.   Pulmonary/Chest: Effort normal and breath sounds normal.  Abdominal: Bowel sounds are normal. She exhibits no distension. There is no tenderness.  Musculoskeletal: Normal range of motion. She exhibits no edema and no tenderness.  Neurological: She is alert and oriented to  person, place, and time. She has normal strength. No cranial nerve deficit or sensory deficit.  No swelling or adenopathy.  Noormal neuro exam, cranial nerves 3-12 intact. Normal coordination, no lateralizing weakenss.   Skin: Skin is warm and dry. No rash noted.  Psychiatric: She has a normal mood and affect.    ED Course  Procedures   DIAGNOSTIC STUDIES: Oxygen Saturation is 97% on RA, adequate by my interpretation.    COORDINATION OF CARE: 10:35 PM Discussed treatment plan with pt at bedside and pt agreed to plan.   Labs Review Labs Reviewed - No data to display Imaging Review No results found.  EKG Interpretation   None      Results for orders placed during the hospital encounter of 06/15/13  URINALYSIS, ROUTINE W REFLEX MICROSCOPIC      Result Value Range   Color, Urine YELLOW  YELLOW   APPearance CLEAR  CLEAR   Specific Gravity, Urine 1.007  1.005 - 1.030   pH 6.5  5.0 - 8.0   Glucose, UA NEGATIVE  NEGATIVE mg/dL   Hgb urine dipstick TRACE (*) NEGATIVE   Bilirubin Urine NEGATIVE  NEGATIVE   Ketones, ur NEGATIVE  NEGATIVE mg/dL   Protein, ur NEGATIVE  NEGATIVE mg/dL   Urobilinogen, UA 0.2  0.0 - 1.0 mg/dL   Nitrite NEGATIVE  NEGATIVE   Leukocytes, UA SMALL (*) NEGATIVE  URINE MICROSCOPIC-ADD ON      Result Value Range   Squamous Epithelial / LPF FEW (*) RARE   WBC, UA 3-6  <3 WBC/hpf   RBC / HPF 3-6  <3 RBC/hpf   Bacteria, UA FEW (*) RARE     MDM  No diagnosis found. 1. Tension headache 2. Dysuria  She complains of persistent symptoms of UTI, treated most recently on 05/28/13 with Cipro. She didn't feel that symptoms were completely resolved after taking the antibiotic so UA was checked. Culture pending. Minimal infection seen tonight but will treat based on urinary frequency and urine that does not appear completely cleared of infection.   No neurologic deficits on exam. Headache is reproducible. Suspect tension type headache.   I personally performed  the services described in this documentation, which was scribed in my presence. The recorded information has been reviewed and is accurate.      Arnoldo Hooker, PA-C 06/15/13 2327

## 2013-06-15 NOTE — ED Notes (Signed)
Pt reports headache x 3 days hx of migraines OTC med and migraine medication ineffective, denies blurred vision or N/V

## 2013-06-17 LAB — URINE CULTURE: Colony Count: NO GROWTH

## 2013-06-17 NOTE — ED Provider Notes (Signed)
Medical screening examination/treatment/procedure(s) were performed by non-physician practitioner and as supervising physician I was immediately available for consultation/collaboration.  EKG Interpretation   None         Charles B. Sheldon, MD 06/17/13 2020 

## 2013-10-23 ENCOUNTER — Emergency Department (HOSPITAL_BASED_OUTPATIENT_CLINIC_OR_DEPARTMENT_OTHER)
Admission: EM | Admit: 2013-10-23 | Discharge: 2013-10-23 | Disposition: A | Discharge status: Home / Self Care | Payer: BC Managed Care – PPO | Attending: Emergency Medicine | Admitting: Emergency Medicine

## 2013-10-23 ENCOUNTER — Encounter (HOSPITAL_BASED_OUTPATIENT_CLINIC_OR_DEPARTMENT_OTHER): Payer: Self-pay | Admitting: Emergency Medicine

## 2013-10-23 DIAGNOSIS — Z3202 Encounter for pregnancy test, result negative: Secondary | ICD-10-CM | POA: Insufficient documentation

## 2013-10-23 DIAGNOSIS — M791 Myalgia, unspecified site: Secondary | ICD-10-CM

## 2013-10-23 DIAGNOSIS — Z791 Long term (current) use of non-steroidal anti-inflammatories (NSAID): Secondary | ICD-10-CM | POA: Insufficient documentation

## 2013-10-23 DIAGNOSIS — IMO0001 Reserved for inherently not codable concepts without codable children: Secondary | ICD-10-CM | POA: Insufficient documentation

## 2013-10-23 DIAGNOSIS — G43909 Migraine, unspecified, not intractable, without status migrainosus: Secondary | ICD-10-CM | POA: Insufficient documentation

## 2013-10-23 DIAGNOSIS — M7989 Other specified soft tissue disorders: Secondary | ICD-10-CM | POA: Insufficient documentation

## 2013-10-23 DIAGNOSIS — A5901 Trichomonal vulvovaginitis: Secondary | ICD-10-CM | POA: Insufficient documentation

## 2013-10-23 DIAGNOSIS — Z8719 Personal history of other diseases of the digestive system: Secondary | ICD-10-CM | POA: Insufficient documentation

## 2013-10-23 DIAGNOSIS — N390 Urinary tract infection, site not specified: Secondary | ICD-10-CM

## 2013-10-23 DIAGNOSIS — Z79899 Other long term (current) drug therapy: Secondary | ICD-10-CM | POA: Insufficient documentation

## 2013-10-23 DIAGNOSIS — A599 Trichomoniasis, unspecified: Secondary | ICD-10-CM

## 2013-10-23 LAB — CBC WITH DIFFERENTIAL/PLATELET
BASOS ABS: 0 10*3/uL (ref 0.0–0.1)
Basophils Relative: 0 % (ref 0–1)
EOS ABS: 0 10*3/uL (ref 0.0–0.7)
EOS PCT: 1 % (ref 0–5)
HCT: 38.3 % (ref 36.0–46.0)
Hemoglobin: 12.2 g/dL (ref 12.0–15.0)
LYMPHS PCT: 33 % (ref 12–46)
Lymphs Abs: 2.1 10*3/uL (ref 0.7–4.0)
MCH: 27.4 pg (ref 26.0–34.0)
MCHC: 31.9 g/dL (ref 30.0–36.0)
MCV: 86.1 fL (ref 78.0–100.0)
Monocytes Absolute: 0.6 10*3/uL (ref 0.1–1.0)
Monocytes Relative: 10 % (ref 3–12)
Neutro Abs: 3.4 10*3/uL (ref 1.7–7.7)
Neutrophils Relative %: 56 % (ref 43–77)
PLATELETS: 262 10*3/uL (ref 150–400)
RBC: 4.45 MIL/uL (ref 3.87–5.11)
RDW: 13.1 % (ref 11.5–15.5)
WBC: 6.2 10*3/uL (ref 4.0–10.5)

## 2013-10-23 LAB — D-DIMER, QUANTITATIVE: D-Dimer, Quant: <0.27 ug/mL-FEU (ref 0.00–0.48)

## 2013-10-23 LAB — BASIC METABOLIC PANEL
BUN: 11 mg/dL (ref 6–23)
CALCIUM: 9.4 mg/dL (ref 8.4–10.5)
CO2: 25 mEq/L (ref 19–32)
CREATININE: 0.8 mg/dL (ref 0.50–1.10)
Chloride: 104 mEq/L (ref 96–112)
GFR calc Af Amer: >90 mL/min (ref >90)
GFR calc non Af Amer: 85 mL/min — ABNORMAL LOW (ref >90)
Glucose, Bld: 97 mg/dL (ref 70–99)
Potassium: 3.8 mEq/L (ref 3.7–5.3)
Sodium: 142 mEq/L (ref 137–147)

## 2013-10-23 LAB — URINALYSIS, ROUTINE W REFLEX MICROSCOPIC
Bilirubin Urine: NEGATIVE
Glucose, UA: NEGATIVE mg/dL
Hgb urine dipstick: NEGATIVE
Ketones, ur: NEGATIVE mg/dL
NITRITE: NEGATIVE
PROTEIN: NEGATIVE mg/dL
Specific Gravity, Urine: 1.006 (ref 1.005–1.030)
UROBILINOGEN UA: 0.2 mg/dL (ref 0.0–1.0)
pH: 8 (ref 5.0–8.0)

## 2013-10-23 LAB — URINE MICROSCOPIC-ADD ON

## 2013-10-23 LAB — CK: Total CK: 171 U/L (ref 7–177)

## 2013-10-23 LAB — PREGNANCY, URINE: Preg Test, Ur: NEGATIVE

## 2013-10-23 MED ORDER — OXYCODONE-ACETAMINOPHEN 5-325 MG PO TABS: 2.0000 | ORAL_TABLET | ORAL | Status: DC | PRN

## 2013-10-23 MED ORDER — OXYCODONE-ACETAMINOPHEN 5-325 MG PO TABS
2.0000 | ORAL_TABLET | Freq: Once | ORAL | Status: AC
  Administered 2013-10-23: 2 via ORAL
  Filled 2013-10-23: qty 2

## 2013-10-23 MED ORDER — CIPROFLOXACIN HCL 500 MG PO TABS
500.0000 mg | ORAL_TABLET | Freq: Two times a day (BID) | ORAL | Status: DC
Start: 2013-10-23 — End: 2014-02-17

## 2013-10-23 MED ORDER — METRONIDAZOLE 500 MG PO TABS: 500.0000 mg | ORAL_TABLET | Freq: Two times a day (BID) | ORAL | Status: DC

## 2013-10-23 NOTE — ED Notes (Signed)
Bilateral leg pain, cramping and swelling x 1 week.  Also reports left knee pain and "numbness".

## 2013-10-23 NOTE — Discharge Instructions (Signed)
Myalgia, Adult Myalgia is the medical term for muscle pain. It is a symptom of many things. Nearly everyone at some time in their life has this. The most common cause for muscle pain is overuse or straining and more so when you are not in shape. Injuries and muscle bruises cause myalgias. Muscle pain without a history of injury can also be caused by a virus. It frequently comes along with the flu. Myalgia not caused by muscle strain can be present in a large number of infectious diseases. Some autoimmune diseases like lupus and fibromyalgia can cause muscle pain. Myalgia may be mild, or severe. SYMPTOMS  The symptoms of myalgia are simply muscle pain. Most of the time this is short lived and the pain goes away without treatment. DIAGNOSIS  Myalgia is diagnosed by your caregiver by taking your history. This means you tell him when the problems began, what they are, and what has been happening. If this has not been a long term problem, your caregiver may want to watch for a while to see what will happen. If it has been long term, they may want to do additional testing. TREATMENT  The treatment depends on what the underlying cause of the muscle pain is. Often anti-inflammatory medications will help. HOME CARE INSTRUCTIONS  If the pain in your muscles came from overuse, slow down your activities until the problems go away.  Myalgia from overuse of a muscle can be treated with alternating hot and cold packs on the muscle affected or with cold for the first couple days. If either heat or cold seems to make things worse, stop their use.  Apply ice to the sore area for 15-20 minutes, 03-04 times per day, while awake for the first 2 days of muscle soreness, or as directed. Put the ice in a plastic bag and place a towel between the bag of ice and your skin.  Only take over-the-counter or prescription medicines for pain, discomfort, or fever as directed by your caregiver.  Regular gentle exercise may help if  you are not active.  Stretching before strenuous exercise can help lower the risk of myalgia. It is normal when beginning an exercise regimen to feel some muscle pain after exercising. Muscles that have not been used frequently will be sore at first. If the pain is extreme, this may mean injury to a muscle. SEEK MEDICAL CARE IF:  You have an increase in muscle pain that is not relieved with medication.  You begin to run a temperature.  You develop nausea and vomiting.  You develop a stiff and painful neck.  You develop a rash.  You develop muscle pain after a tick bite.  You have continued muscle pain while working out even after you are in good condition. SEEK IMMEDIATE MEDICAL CARE IF: Any of your problems are getting worse and medications are not helping. MAKE SURE YOU:   Understand these instructions.  Will watch your condition.  Will get help right away if you are not doing well or get worse. Document Released: 06/01/2006 Document Revised: 10/02/2011 Document Reviewed: 08/21/2006 [facility] Patient Information 2014 [place].  Trichomoniasis Trichomoniasis is an infection, caused by the Trichomonas organism, that affects both women and men. In women, the outer female genitalia and the vagina are affected. In men, the penis is mainly affected, but the prostate and other reproductive organs can also be involved. Trichomoniasis is a sexually transmitted disease (STD) and is most often passed to another person through sexual contact. The  majority of people who get trichomoniasis do so from a sexual encounter and are also at risk for other STDs. CAUSES   Sexual intercourse with an infected partner.  It can be present in swimming pools or hot tubs. SYMPTOMS   Abnormal gray-green frothy vaginal discharge in women.  Vaginal itching and irritation in women.  Itching and irritation of the area outside the vagina in women.  Penile discharge with or without pain in  males.  Inflammation of the urethra (urethritis), causing painful urination.  Bleeding after sexual intercourse. RELATED COMPLICATIONS  Pelvic inflammatory disease.  Infection of the uterus (endometritis).  Infertility.  Tubal (ectopic) pregnancy.  It can be associated with other STDs, including gonorrhea and chlamydia, hepatitis B, and HIV. COMPLICATIONS DURING PREGNANCY  Early (premature) delivery.  Premature rupture of the membranes (PROM).  Low birth weight. DIAGNOSIS   Visualization of Trichomonas under the microscope from the vagina discharge.  Ph of the vagina greater than 4.5, tested with a test tape.  Trich Rapid Test.  Culture of the organism, but this is not usually needed.  It may be found on a Pap test.  Having a "strawberry cervix,"which means the cervix looks very red like a strawberry. TREATMENT   You may be given medication to fight the infection. Inform your caregiver if you could be or are pregnant. Some medications used to treat the infection should not be taken during pregnancy.  Over-the-counter medications or creams to decrease itching or irritation may be recommended.  Your sexual partner will need to be treated if infected. HOME CARE INSTRUCTIONS   Take all medication prescribed by your caregiver.  Take over-the-counter medication for itching or irritation as directed by your caregiver.  Do not have sexual intercourse while you have the infection.  Do not douche or wear tampons.  Discuss your infection with your partner, as your partner may have acquired the infection from you. Or, your partner may have been the person who transmitted the infection to you.  Have your sex partner examined and treated if necessary.  Practice safe, informed, and protected sex.  See your caregiver for other STD testing. SEEK MEDICAL CARE IF:   You still have symptoms after you finish the medication.  You have an oral temperature above 102 F (38.9  C).  You develop belly (abdominal) pain.  You have pain when you urinate.  You have bleeding after sexual intercourse.  You develop a rash.  The medication makes you sick or makes you throw up (vomit). Document Released: 01/03/2001 Document Revised: 10/02/2011 Document Reviewed: 01/29/2009 [facility] Patient Information 2014 [place], [place].  Urinary Tract Infection Urinary tract infections (UTIs) can develop anywhere along your urinary tract. Your urinary tract is your body's drainage system for removing wastes and extra water. Your urinary tract includes two kidneys, two ureters, a bladder, and a urethra. Your kidneys are a pair of bean-shaped organs. Each kidney is about the size of your fist. They are located below your ribs, one on each side of your spine. CAUSES Infections are caused by microbes, which are microscopic organisms, including fungi, viruses, and bacteria. These organisms are so small that they can only be seen through a microscope. Bacteria are the microbes that most commonly cause UTIs. SYMPTOMS  Symptoms of UTIs may vary by age and gender of the patient and by the location of the infection. Symptoms in young women typically include a frequent and intense urge to urinate and a painful, burning feeling in the bladder or urethra  during urination. Older women and men are more likely to be tired, shaky, and weak and have muscle aches and abdominal pain. A fever may mean the infection is in your kidneys. Other symptoms of a kidney infection include pain in your back or sides below the ribs, nausea, and vomiting. DIAGNOSIS To diagnose a UTI, your caregiver will ask you about your symptoms. Your caregiver also will ask to provide a urine sample. The urine sample will be tested for bacteria and white blood cells. White blood cells are made by your body to help fight infection. TREATMENT  Typically, UTIs can be treated with medication. Because most UTIs are caused by a bacterial  infection, they usually can be treated with the use of antibiotics. The choice of antibiotic and length of treatment depend on your symptoms and the type of bacteria causing your infection. HOME CARE INSTRUCTIONS  If you were prescribed antibiotics, take them exactly as your caregiver instructs you. Finish the medication even if you feel better after you have only taken some of the medication.  Drink enough water and fluids to keep your urine clear or pale yellow.  Avoid caffeine, tea, and carbonated beverages. They tend to irritate your bladder.  Empty your bladder often. Avoid holding urine for long periods of time.  Empty your bladder before and after sexual intercourse.  After a bowel movement, women should cleanse from front to back. Use each tissue only once. SEEK MEDICAL CARE IF:   You have back pain.  You develop a fever.  Your symptoms do not begin to resolve within 3 days. SEEK IMMEDIATE MEDICAL CARE IF:   You have severe back pain or lower abdominal pain.  You develop chills.  You have nausea or vomiting.  You have continued burning or discomfort with urination. MAKE SURE YOU:   Understand these instructions.  Will watch your condition.  Will get help right away if you are not doing well or get worse. Document Released: 04/19/2005 Document Revised: 01/09/2012 Document Reviewed: 08/18/2011 [facility] Patient Information 2014 [place], [place].

## 2013-10-23 NOTE — ED Provider Notes (Signed)
CSN: [id number]     Arrival date & time 10/23/13  1723 History   First MD Initiated Contact with Patient 10/23/13 1734     Chief Complaint  Patient presents with  . Leg Pain     (Consider location/radiation/quality/duration/timing/severity/associated sxs/prior Treatment) HPI Comments: Patient presents with cramping in her thighs and lower legs. She states it started about a week ago and it feels like a charley horse in her upper thighs. She states that the cramping travels down into her lower legs. She also has some pain in her knees. She's noticed a low bit of swelling in her legs. She also is no stool the swelling in her hands. She denies any chest pain or shortness of breath. She denies any new medications. She denies any fevers or other recent illnesses. She is not on any diuretics.  Patient is a 48 y.o. female presenting with leg pain.  Leg Pain Associated symptoms: no back pain, no fatigue and no fever     Past Medical History  Diagnosis Date  . Migraine   . GERD (gastroesophageal reflux disease)   . Lung nodules    Past Surgical History  Procedure Laterality Date  . Cesarean section    . Mandible surgery    . Tubal ligation     No family history on file. History  Substance Use Topics  . Smoking status: Never Smoker   . Smokeless tobacco: Never Used  . Alcohol Use: No   OB History   Grav Para Term Preterm Abortions TAB SAB Ect Mult Living   5 5       1       Review of Systems  Constitutional: Negative for fever, chills, diaphoresis and fatigue.  HENT: Negative for congestion, rhinorrhea and sneezing.   Eyes: Negative.   Respiratory: Negative for cough, chest tightness and shortness of breath.   Cardiovascular: Positive for leg swelling. Negative for chest pain.  Gastrointestinal: Negative for nausea, vomiting, abdominal pain, diarrhea and blood in stool.  Genitourinary: Negative for frequency, hematuria, flank pain and difficulty urinating.  Musculoskeletal:  Positive for arthralgias and myalgias. Negative for back pain.  Skin: Negative for rash.  Neurological: Negative for dizziness, speech difficulty, weakness, numbness and headaches.      Allergies  Shellfish allergy and Pork-derived products  Home Medications   Current Outpatient Rx  Name  Route  Sig  Dispense  Refill  . butalbital-acetaminophen-caffeine (FIORICET WITH CODEINE) 50-325-40-30 MG per capsule   Oral   Take 1 capsule by mouth every 4 (four) hours as needed for headache or migraine.         . ciprofloxacin (CIPRO) 500 MG tablet   Oral   Take 1 tablet (500 mg total) by mouth every 12 (twelve) hours.   20 tablet   0   . ciprofloxacin (CIPRO) 500 MG tablet   Oral   Take 1 tablet (500 mg total) by mouth 2 (two) times daily.   10 tablet   0   . ciprofloxacin (CIPRO) 500 MG tablet   Oral   Take 1 tablet (500 mg total) by mouth 2 (two) times daily. One po bid x 7 days   14 tablet   0   . cyclobenzaprine (FLEXERIL) 10 MG tablet   Oral   Take 1 tablet (10 mg total) by mouth 2 (two) times daily as needed for muscle spasms.   20 tablet   0   . HYDROcodone-acetaminophen (NORCO/VICODIN) 5-325 MG per tablet   Oral  Take 1-2 tablets by mouth every 6 (six) hours as needed.   12 tablet   0   . ibuprofen (ADVIL,MOTRIN) 800 MG tablet   Oral   Take 1 tablet (800 mg total) by mouth 3 (three) times daily.   21 tablet   0   . ibuprofen (ADVIL,MOTRIN) 800 MG tablet   Oral   Take 1 tablet (800 mg total) by mouth every 8 (eight) hours as needed for mild pain or moderate pain.   21 tablet   0   . metroNIDAZOLE (FLAGYL) 500 MG tablet   Oral   Take 1 tablet (500 mg total) by mouth 2 (two) times daily. One po bid x 7 days   14 tablet   0   . oxyCODONE-acetaminophen (PERCOCET) 5-325 MG per tablet   Oral   Take 2 tablets by mouth every 4 (four) hours as needed.   15 tablet   0   . predniSONE (DELTASONE) 20 MG tablet   Oral   Take 2 tablets (40 mg total) by mouth  daily.   10 tablet   0   . topiramate (TOPAMAX) 50 MG tablet   Oral   Take 50 mg by mouth 2 (two) times daily.         . traMADol (ULTRAM) 50 MG tablet   Oral   Take 1 tablet (50 mg total) by mouth every 6 (six) hours as needed.   15 tablet   0    BP 155/89  Pulse 84  Temp(Src) 98.9 F (37.2 C) (Oral)  Resp 18  Ht 5\' 7"  (1.702 m)  Wt 140 lb (63.504 kg)  BMI 21.92 kg/m2  SpO2 98% Physical Exam  Constitutional: She is oriented to person, place, and time. She appears well-developed and well-nourished.  HENT:  Head: Normocephalic and atraumatic.  Eyes: Pupils are equal, round, and reactive to light.  Neck: Normal range of motion. Neck supple.  Cardiovascular: Normal rate, regular rhythm and normal heart sounds.   Pulmonary/Chest: Effort normal and breath sounds normal. No respiratory distress. She has no wheezes. She has no rales. She exhibits no tenderness.  Abdominal: Soft. Bowel sounds are normal. There is no tenderness. There is no rebound and no guarding.  Musculoskeletal: Normal range of motion. She exhibits edema (Trace pitting edema bilaterally).  Patient is tenderness across the musculature of the quadriceps and along the lateral aspects of both lower legs. There's no pain in the posterior calves. Pedal pulses are strong and symmetric. She has normal color to the feet. There is no wounds or rashes.  Lymphadenopathy:    She has no cervical adenopathy.  Neurological: She is alert and oriented to person, place, and time.  Skin: Skin is warm and dry. No rash noted.  Psychiatric: She has a normal mood and affect.    ED Course  Procedures (including critical care time) Labs Review Results for orders placed during the hospital encounter of 10/23/13  BASIC METABOLIC PANEL      Result Value Ref Range   Sodium 142  137 - 147 mEq/L   Potassium 3.8  3.7 - 5.3 mEq/L   Chloride 104  96 - 112 mEq/L   CO2 25  19 - 32 mEq/L   Glucose, Bld 97  70 - 99 mg/dL   BUN 11  6 - 23  mg/dL   Creatinine, Ser 1.61  0.50 - 1.10 mg/dL   Calcium 9.4  8.4 - 09.6 mg/dL   GFR calc non Af Denyse Dago  85 (*) >90 mL/min   GFR calc Af Amer >90  >90 mL/min  CBC WITH DIFFERENTIAL      Result Value Ref Range   WBC 6.2  4.0 - 10.5 K/uL   RBC 4.45  3.87 - 5.11 MIL/uL   Hemoglobin 12.2  12.0 - 15.0 g/dL   HCT 16.1  09.6 - 04.5 %   MCV 86.1  78.0 - 100.0 fL   MCH 27.4  26.0 - 34.0 pg   MCHC 31.9  30.0 - 36.0 g/dL   RDW 40.9  81.1 - 91.4 %   Platelets 262  150 - 400 K/uL   Neutrophils Relative % 56  43 - 77 %   Neutro Abs 3.4  1.7 - 7.7 K/uL   Lymphocytes Relative 33  12 - 46 %   Lymphs Abs 2.1  0.7 - 4.0 K/uL   Monocytes Relative 10  3 - 12 %   Monocytes Absolute 0.6  0.1 - 1.0 K/uL   Eosinophils Relative 1  0 - 5 %   Eosinophils Absolute 0.0  0.0 - 0.7 K/uL   Basophils Relative 0  0 - 1 %   Basophils Absolute 0.0  0.0 - 0.1 K/uL  URINALYSIS, ROUTINE W REFLEX MICROSCOPIC      Result Value Ref Range   Color, Urine YELLOW  YELLOW   APPearance CLOUDY (*) CLEAR   Specific Gravity, Urine 1.006  1.005 - 1.030   pH 8.0  5.0 - 8.0   Glucose, UA NEGATIVE  NEGATIVE mg/dL   Hgb urine dipstick NEGATIVE  NEGATIVE   Bilirubin Urine NEGATIVE  NEGATIVE   Ketones, ur NEGATIVE  NEGATIVE mg/dL   Protein, ur NEGATIVE  NEGATIVE mg/dL   Urobilinogen, UA 0.2  0.0 - 1.0 mg/dL   Nitrite NEGATIVE  NEGATIVE   Leukocytes, UA MODERATE (*) NEGATIVE  PREGNANCY, URINE      Result Value Ref Range   Preg Test, Ur NEGATIVE  NEGATIVE  D-DIMER, QUANTITATIVE      Result Value Ref Range   D-Dimer, Quant <0.27  0.00 - 0.48 ug/mL-FEU  CK      Result Value Ref Range   Total CK 171  7 - 177 U/L  URINE MICROSCOPIC-ADD ON      Result Value Ref Range   Squamous Epithelial / LPF FEW (*) RARE   WBC, UA 11-20  <3 WBC/hpf   Bacteria, UA RARE  RARE   Urine-Other TRICHOMONAS PRESENT     No results found.   Imaging Review No results found.   EKG Interpretation None      MDM   Final diagnoses:  Myalgia   UTI (lower urinary tract infection)  Trichimoniasis    Patient presents with myalgias from her thighs down to her lower legs. She also has some joint pains in her knees. She has trace edema. Her labs were unremarkable. There is no evidence of infection. There is no evidence of rhabdomyolysis. There is no suggestions of DVT. She does have a urinary tract infection I will go ahead and start her on antibiotics for that. She also has evidence of Trichomonas. I counseled her and her husband on the Trichomonas and that they both need to be treated. She was given a prescription for Cipro, Flagyl and Percocet for pain. I encouraged her to followup with her primary care physician.    Rolan Bucco, MD 10/23/13 [phone]

## 2013-10-29 ENCOUNTER — Emergency Department (HOSPITAL_COMMUNITY)
Admission: EM | Admit: 2013-10-29 | Discharge: 2013-10-29 | Disposition: A | Discharge status: Home / Self Care | Payer: BC Managed Care – PPO | Attending: Emergency Medicine | Admitting: Emergency Medicine

## 2013-10-29 ENCOUNTER — Emergency Department (HOSPITAL_COMMUNITY): Payer: BC Managed Care – PPO

## 2013-10-29 ENCOUNTER — Encounter (HOSPITAL_COMMUNITY): Payer: Self-pay | Admitting: Emergency Medicine

## 2013-10-29 DIAGNOSIS — Z8619 Personal history of other infectious and parasitic diseases: Secondary | ICD-10-CM | POA: Insufficient documentation

## 2013-10-29 DIAGNOSIS — Z79899 Other long term (current) drug therapy: Secondary | ICD-10-CM | POA: Insufficient documentation

## 2013-10-29 DIAGNOSIS — M25559 Pain in unspecified hip: Secondary | ICD-10-CM | POA: Insufficient documentation

## 2013-10-29 DIAGNOSIS — M549 Dorsalgia, unspecified: Secondary | ICD-10-CM | POA: Insufficient documentation

## 2013-10-29 DIAGNOSIS — M79605 Pain in left leg: Secondary | ICD-10-CM

## 2013-10-29 DIAGNOSIS — M25469 Effusion, unspecified knee: Secondary | ICD-10-CM | POA: Insufficient documentation

## 2013-10-29 DIAGNOSIS — Z87440 Personal history of urinary (tract) infections: Secondary | ICD-10-CM | POA: Insufficient documentation

## 2013-10-29 DIAGNOSIS — Z8719 Personal history of other diseases of the digestive system: Secondary | ICD-10-CM | POA: Insufficient documentation

## 2013-10-29 DIAGNOSIS — G43909 Migraine, unspecified, not intractable, without status migrainosus: Secondary | ICD-10-CM | POA: Insufficient documentation

## 2013-10-29 DIAGNOSIS — M79604 Pain in right leg: Secondary | ICD-10-CM

## 2013-10-29 DIAGNOSIS — Z792 Long term (current) use of antibiotics: Secondary | ICD-10-CM | POA: Insufficient documentation

## 2013-10-29 LAB — I-STAT CHEM 8, ED
BUN: 11 mg/dL (ref 6–23)
CHLORIDE: 104 meq/L (ref 96–112)
Calcium, Ion: 1.26 mmol/L — ABNORMAL HIGH (ref 1.12–1.23)
Creatinine, Ser: 0.9 mg/dL (ref 0.50–1.10)
Glucose, Bld: 98 mg/dL (ref 70–99)
HEMATOCRIT: 43 % (ref 36.0–46.0)
Hemoglobin: 14.6 g/dL (ref 12.0–15.0)
POTASSIUM: 4.2 meq/L (ref 3.7–5.3)
Sodium: 141 mEq/L (ref 137–147)
TCO2: 25 mmol/L (ref 0–100)

## 2013-10-29 LAB — CK: CK TOTAL: 133 U/L (ref 7–177)

## 2013-10-29 MED ORDER — SODIUM CHLORIDE 0.9 % IV BOLUS (SEPSIS)
1000.0000 mL | Freq: Once | INTRAVENOUS | Status: AC
  Administered 2013-10-29: 1000 mL via INTRAVENOUS

## 2013-10-29 MED ORDER — ONDANSETRON HCL 4 MG/2ML IJ SOLN
4.0000 mg | Freq: Once | INTRAMUSCULAR | Status: AC
  Administered 2013-10-29: 4 mg via INTRAVENOUS
  Filled 2013-10-29: qty 2

## 2013-10-29 MED ORDER — IBUPROFEN 800 MG PO TABS: 800.0000 mg | ORAL_TABLET | Freq: Three times a day (TID) | ORAL | Status: DC | PRN

## 2013-10-29 MED ORDER — MORPHINE SULFATE 4 MG/ML IJ SOLN
4.0000 mg | Freq: Once | INTRAMUSCULAR | Status: AC
  Administered 2013-10-29: 4 mg via INTRAVENOUS
  Filled 2013-10-29: qty 1

## 2013-10-29 MED ORDER — OXYCODONE-ACETAMINOPHEN 5-325 MG PO TABS: 1.0000 | ORAL_TABLET | ORAL | Status: DC | PRN

## 2013-10-29 NOTE — ED Notes (Signed)
Pt has been having back pain, leg swelling and nausea. [facility] Pt will complete Rx tomorrow and is not getting any better.

## 2013-10-29 NOTE — ED Provider Notes (Signed)
TIME SEEN: 8:03 PM  CHIEF COMPLAINT: Bilateral lower extremity pain  HPI: Patient is a 48 y.o. F history of migraine headaches and GERD who presents to the emergency department with one week of cramping in her bilateral thighs and swelling of her bilateral knees. She denies any history of injury. [facility] and diagnosed with urinary tract infection and trichomonas. She was started on Cipro and Flagyl and discharged with Percocet. She states she does not feel that her pain in her legs is getting any better. She denies any numbness or weakness. No history of injury. No fever. No new strenuous activity. A history of PE or DVT. [facility], patient had negative labs with normal electrolytes and negative d-dimer.  ROS: See HPI Constitutional: no fever  Eyes: no drainage  ENT: no runny nose   Cardiovascular:  no chest pain  Resp: no SOB  GI: no vomiting GU: no dysuria Integumentary: no rash  Allergy: no hives  Musculoskeletal: no leg swelling  Neurological: no slurred speech ROS otherwise negative  PAST MEDICAL HISTORY/PAST SURGICAL HISTORY:  Past Medical History  Diagnosis Date  . Migraine   . GERD (gastroesophageal reflux disease)   . Lung nodules     MEDICATIONS:  Prior to Admission medications   Medication Sig Start Date End Date Taking? Authorizing Provider  butalbital-acetaminophen-caffeine (FIORICET WITH CODEINE) 50-325-40-30 MG per capsule Take 1 capsule by mouth every 4 (four) hours as needed for headache or migraine.   Yes Historical Provider, MD  ciprofloxacin (CIPRO) 500 MG tablet Take 1 tablet (500 mg total) by mouth 2 (two) times daily. One po bid x 7 days 10/23/13  Yes Rolan BuccoMelanie Belfi, MD  ibuprofen (ADVIL,MOTRIN) 200 MG tablet Take 400 mg by mouth every 6 (six) hours as needed for moderate pain.   Yes Historical Provider, MD  metroNIDAZOLE (FLAGYL) 500 MG tablet Take 1 tablet (500 mg total) by mouth 2 (two) times daily. One po bid x 7  days 10/23/13  Yes Rolan BuccoMelanie Belfi, MD  oxyCODONE-acetaminophen (PERCOCET) 5-325 MG per tablet Take 2 tablets by mouth every 4 (four) hours as needed. 10/23/13  Yes Rolan BuccoMelanie Belfi, MD  topiramate (TOPAMAX) 50 MG tablet Take 50 mg by mouth 2 (two) times daily.   Yes Historical Provider, MD    ALLERGIES:  Allergies  Allergen Reactions  . Shellfish Allergy Swelling  . Pork-Derived Products Nausea And Vomiting    SOCIAL HISTORY:  History  Substance Use Topics  . Smoking status: Never Smoker   . Smokeless tobacco: Never Used  . Alcohol Use: No    FAMILY HISTORY: History reviewed. No pertinent family history.  EXAM: BP 127/77  Pulse 72  Temp(Src) 98 F (36.7 C)  Resp 20  SpO2 100% CONSTITUTIONAL: Alert and oriented and responds appropriately to questions. Well-appearing; well-nourished HEAD: Normocephalic EYES: Conjunctivae clear, PERRL ENT: normal nose; no rhinorrhea; moist mucous membranes; pharynx without lesions noted NECK: Supple, no meningismus, no LAD  CARD: RRR; S1 and S2 appreciated; no murmurs, no clicks, no rubs, no gallops RESP: Normal chest excursion without splinting or tachypnea; breath sounds clear and equal bilaterally; no wheezes, no rhonchi, no rales,  ABD/GI: Normal bowel sounds; non-distended; soft, non-tender, no rebound, no guarding BACK:  The back appears normal and is non-tender to palpation, there is no CVA tenderness EXT: Normal ROM in all joints; non-tender to palpation; no edema; normal capillary refill; no cyanosis, 2+ DP pulses bilaterally, no posterior calf tenderness, no  rashes or lesions, no joint effusion, no ligamentous laxity, no induration or warmth or fluctuance or erythema    SKIN: Normal color for age and race; warm NEURO: Moves all extremities equally; sensation to light touch intact diffusely PSYCH: The patient's mood and manner are appropriate. Grooming and personal hygiene are appropriate.  MEDICAL DECISION MAKING: Patient here with  bilateral lower extremity pain for one week. She had a negative workup including normal electrolytes and negative d-dimer recently. No sign of infection on exam. No sign of joint effusion. No history of injury. She is neurovascular intact distally. We'll recheck basic labs and obtain x-rays of bilateral knees and a CK. We'll give IV fluids, pain medication and reassess.  ED PROGRESS: Patient reports some improvement of symptoms after IV pain medication. Her electrolytes and CT today are completely normal. Have discussed with patient that I do not feel there is any life-threatening illness present but that I recommend followup with her primary care physician this week. Have discussed return precautions. Patient verbalizes understanding is comfortable this plan. We'll give refill for Percocet.     Monique Maw Radiah Lubinski, DO 10/29/13 2216

## 2013-10-29 NOTE — Discharge Instructions (Signed)
You were seen in the emergency department for pain in your legs. Your labs show normal electrolytes and no signs of muscle breakdown. Your recent labs were negative for a blood clot. Your x-rays were also normal. The cause of your symptoms is unclear but I do not feel there is any life-threatening emergency. Please follow up with your primary care physician for further workup for your lower extremity pain. If you have worsening pain, swelling of the legs, numbness or weakness, chest pain or shortness of breath, fever, and symptom concerning to you please return to the emergency department.   Pain of Unknown Etiology (Pain Without a Known Cause) You have come to your caregiver because of pain. Pain can occur in any part of the body. Often there is not a definite cause. If your laboratory (blood or urine) work was normal and X-rays or other studies were normal, your caregiver may treat you without knowing the cause of the pain. An example of this is the headache. Most headaches are diagnosed by taking a history. This means your caregiver asks you questions about your headaches. Your caregiver determines a treatment based on your answers. Usually testing done for headaches is normal. Often testing is not done unless there is no response to medications. Regardless of where your pain is located today, you can be given medications to make you comfortable. If no physical cause of pain can be found, most cases of pain will gradually leave as suddenly as they came.  If you have a painful condition and no reason can be found for the pain, it is important that you follow up with your caregiver. If the pain becomes worse or does not go away, it may be necessary to repeat tests and look further for a possible cause.  Only take over-the-counter or prescription medicines for pain, discomfort, or fever as directed by your caregiver.  For the protection of your privacy, test results cannot be given over the phone. Make sure  you receive the results of your test. Ask how these results are to be obtained if you have not been informed. It is your responsibility to obtain your test results.  You may continue all activities unless the activities cause more pain. When the pain lessens, it is important to gradually resume normal activities. Resume activities by beginning slowly and gradually increasing the intensity and duration of the activities or exercise. During periods of severe pain, bed rest may be helpful. Lie or sit in any position that is comfortable.  Ice used for acute (sudden) conditions may be effective. Use a large plastic bag filled with ice and wrapped in a towel. This may provide pain relief.  See your caregiver for continued problems. Your caregiver can help or refer you for exercises or physical therapy if necessary. If you were given medications for your condition, do not drive, operate machinery or power tools, or sign legal documents for 24 hours. Do not drink alcohol, take sleeping pills, or take other medications that may interfere with treatment. See your caregiver immediately if you have pain that is becoming worse and not relieved by medications. Document Released: 04/04/2001 Document Revised: 04/30/2013 Document Reviewed: 07/10/2005 [facility] Patient Information 2014 [place], [place].  Musculoskeletal Pain Musculoskeletal pain is muscle and boney aches and pains. These pains can occur in any part of the body. Your caregiver may treat you without knowing the cause of the pain. They may treat you if blood or urine tests, X-rays, and other tests were normal.  CAUSES There is often not a definite cause or reason for these pains. These pains may be caused by a type of germ (virus). The discomfort may also come from overuse. Overuse includes working out too hard when your body is not fit. Boney aches also come from weather changes. Bone is sensitive to atmospheric pressure changes. HOME CARE INSTRUCTIONS    Ask when your test results will be ready. Make sure you get your test results.  Only take over-the-counter or prescription medicines for pain, discomfort, or fever as directed by your caregiver. If you were given medications for your condition, do not drive, operate machinery or power tools, or sign legal documents for 24 hours. Do not drink alcohol. Do not take sleeping pills or other medications that may interfere with treatment.  Continue all activities unless the activities cause more pain. When the pain lessens, slowly resume normal activities. Gradually increase the intensity and duration of the activities or exercise.  During periods of severe pain, bed rest may be helpful. Lay or sit in any position that is comfortable.  Putting ice on the injured area.  Put ice in a bag.  Place a towel between your skin and the bag.  Leave the ice on for 15 to 20 minutes, 3 to 4 times a day.  Follow up with your caregiver for continued problems and no reason can be found for the pain. If the pain becomes worse or does not go away, it may be necessary to repeat tests or do additional testing. Your caregiver may need to look further for a possible cause. SEEK IMMEDIATE MEDICAL CARE IF:  You have pain that is getting worse and is not relieved by medications.  You develop chest pain that is associated with shortness or breath, sweating, feeling sick to your stomach (nauseous), or throw up (vomit).  Your pain becomes localized to the abdomen.  You develop any new symptoms that seem different or that concern you. MAKE SURE YOU:   Understand these instructions.  Will watch your condition.  Will get help right away if you are not doing well or get worse. Document Released: 07/10/2005 Document Revised: 10/02/2011 Document Reviewed: 03/14/2013 [facility] Patient Information 2014 [place], [place].

## 2013-11-10 ENCOUNTER — Other Ambulatory Visit: Payer: Self-pay | Admitting: Internal Medicine

## 2013-11-10 DIAGNOSIS — Z1231 Encounter for screening mammogram for malignant neoplasm of breast: Secondary | ICD-10-CM

## 2013-11-19 ENCOUNTER — Ambulatory Visit
Admission: RE | Admit: 2013-11-19 | Discharge: 2013-11-19 | Disposition: A | Discharge status: Home / Self Care | Payer: Self-pay | Source: Ambulatory Visit | Attending: Internal Medicine | Admitting: Internal Medicine

## 2013-11-19 ENCOUNTER — Encounter (INDEPENDENT_AMBULATORY_CARE_PROVIDER_SITE_OTHER): Payer: Self-pay

## 2013-11-19 DIAGNOSIS — Z1231 Encounter for screening mammogram for malignant neoplasm of breast: Secondary | ICD-10-CM

## 2013-12-31 ENCOUNTER — Emergency Department (HOSPITAL_BASED_OUTPATIENT_CLINIC_OR_DEPARTMENT_OTHER)
Admission: EM | Admit: 2013-12-31 | Discharge: 2013-12-31 | Disposition: A | Discharge status: Home / Self Care | Payer: Managed Care, Other (non HMO) | Attending: Emergency Medicine | Admitting: Emergency Medicine

## 2013-12-31 ENCOUNTER — Encounter (HOSPITAL_BASED_OUTPATIENT_CLINIC_OR_DEPARTMENT_OTHER): Payer: Self-pay | Admitting: Emergency Medicine

## 2013-12-31 DIAGNOSIS — Z8719 Personal history of other diseases of the digestive system: Secondary | ICD-10-CM | POA: Insufficient documentation

## 2013-12-31 DIAGNOSIS — N950 Postmenopausal bleeding: Secondary | ICD-10-CM | POA: Insufficient documentation

## 2013-12-31 DIAGNOSIS — R110 Nausea: Secondary | ICD-10-CM | POA: Insufficient documentation

## 2013-12-31 DIAGNOSIS — Z792 Long term (current) use of antibiotics: Secondary | ICD-10-CM | POA: Insufficient documentation

## 2013-12-31 DIAGNOSIS — G43909 Migraine, unspecified, not intractable, without status migrainosus: Secondary | ICD-10-CM | POA: Insufficient documentation

## 2013-12-31 DIAGNOSIS — Z3202 Encounter for pregnancy test, result negative: Secondary | ICD-10-CM | POA: Insufficient documentation

## 2013-12-31 DIAGNOSIS — R109 Unspecified abdominal pain: Secondary | ICD-10-CM | POA: Insufficient documentation

## 2013-12-31 DIAGNOSIS — R42 Dizziness and giddiness: Secondary | ICD-10-CM | POA: Insufficient documentation

## 2013-12-31 DIAGNOSIS — Z79899 Other long term (current) drug therapy: Secondary | ICD-10-CM | POA: Insufficient documentation

## 2013-12-31 LAB — CBC
HEMATOCRIT: 37 % (ref 36.0–46.0)
HEMOGLOBIN: 11.9 g/dL — AB (ref 12.0–15.0)
MCH: 27.7 pg (ref 26.0–34.0)
MCHC: 32.2 g/dL (ref 30.0–36.0)
MCV: 86 fL (ref 78.0–100.0)
Platelets: 265 10*3/uL (ref 150–400)
RBC: 4.3 MIL/uL (ref 3.87–5.11)
RDW: 13.2 % (ref 11.5–15.5)
WBC: 6.2 10*3/uL (ref 4.0–10.5)

## 2013-12-31 LAB — BASIC METABOLIC PANEL
BUN: 12 mg/dL (ref 6–23)
CHLORIDE: 106 meq/L (ref 96–112)
CO2: 25 meq/L (ref 19–32)
Calcium: 9.3 mg/dL (ref 8.4–10.5)
Creatinine, Ser: 0.8 mg/dL (ref 0.50–1.10)
GFR calc Af Amer: >90 mL/min (ref >90)
GFR calc non Af Amer: 85 mL/min — ABNORMAL LOW (ref >90)
GLUCOSE: 98 mg/dL (ref 70–99)
POTASSIUM: 3.9 meq/L (ref 3.7–5.3)
SODIUM: 142 meq/L (ref 137–147)

## 2013-12-31 LAB — PREGNANCY, URINE: Preg Test, Ur: NEGATIVE

## 2013-12-31 MED ORDER — ESTROGENS CONJUGATED 1.25 MG PO TABS: 2.5000 mg | ORAL_TABLET | Freq: Four times a day (QID) | ORAL | Status: DC

## 2013-12-31 MED ORDER — ESTROGENS CONJUGATED 1.25 MG PO TABS
2.5000 mg | ORAL_TABLET | Freq: Once | ORAL | Status: DC
  Filled 2013-12-31: qty 2

## 2013-12-31 MED ORDER — SODIUM CHLORIDE 0.9 % IV BOLUS (SEPSIS): 1000.0000 mL | Freq: Once | INTRAVENOUS | Status: DC

## 2013-12-31 NOTE — ED Notes (Signed)
Pelvic cart set up at bedside  

## 2013-12-31 NOTE — ED Provider Notes (Signed)
CSN: [id number]     Arrival date & time 12/31/13  2034 History  This chart was scribed for Dagmar Hait, MD by Evon Slack, ED Scribe. This patient was seen in room MH10/MH10 and the patient's care was started at 9:56 PM.      Chief Complaint  Patient presents with  . Vaginal Bleeding   Patient is a 48 y.o. female presenting with vaginal bleeding. The history is provided by the patient. No language interpreter was used.  Vaginal Bleeding Severity:  Moderate Onset quality:  Sudden Duration:  3 days Chronicity:  New Menstrual history:  Postmenopausal Context: not foreign body   Relieved by:  None tried Worsened by:  Nothing tried Ineffective treatments:  None tried Associated symptoms: abdominal pain, dizziness and nausea   Associated symptoms: no dysuria   HPI Comments: Monique Tyler is a 48 y.o. female who presents to the Emergency Department complaining of vaginal bleeding onset 3 days prior. States she hasn't had her period on 3 years. States she has associated dizziness onset today, nausea, light headedness, and abdominal pain. She states that she is using about 1 tampon per hour. States she has a h/o bleeding in her past but her symptoms were not similar to the symptoms present today. She states that she has a history of fibroids. She denies emesis, diarrhea, dysuria, or any other related symptoms.    Past Medical History  Diagnosis Date  . Migraine   . GERD (gastroesophageal reflux disease)   . Lung nodules    Past Surgical History  Procedure Laterality Date  . Cesarean section    . Mandible surgery    . Tubal ligation     History reviewed. No pertinent family history. History  Substance Use Topics  . Smoking status: Never Smoker   . Smokeless tobacco: Never Used  . Alcohol Use: No   OB History   Grav Para Term Preterm Abortions TAB SAB Ect Mult Living   5 5       1       Review of Systems  Gastrointestinal: Positive for nausea and abdominal pain.  Negative for vomiting and diarrhea.  Genitourinary: Positive for vaginal bleeding. Negative for dysuria.  Neurological: Positive for dizziness.  All other systems reviewed and are negative.   Allergies  Shellfish allergy and Pork-derived products  Home Medications   Prior to Admission medications   Medication Sig Start Date End Date Taking? Authorizing Provider  butalbital-acetaminophen-caffeine (FIORICET WITH CODEINE) 50-325-40-30 MG per capsule Take 1 capsule by mouth every 4 (four) hours as needed for headache or migraine.    Historical Provider, MD  ciprofloxacin (CIPRO) 500 MG tablet Take 1 tablet (500 mg total) by mouth 2 (two) times daily. One po bid x 7 days 10/23/13   Rolan Bucco, MD  ibuprofen (ADVIL,MOTRIN) 200 MG tablet Take 400 mg by mouth every 6 (six) hours as needed for moderate pain.    Historical Provider, MD  ibuprofen (ADVIL,MOTRIN) 800 MG tablet Take 1 tablet (800 mg total) by mouth every 8 (eight) hours as needed for mild pain. 10/29/13   Kristen N Ward, DO  metroNIDAZOLE (FLAGYL) 500 MG tablet Take 1 tablet (500 mg total) by mouth 2 (two) times daily. One po bid x 7 days 10/23/13   Rolan Bucco, MD  oxyCODONE-acetaminophen (PERCOCET) 5-325 MG per tablet Take 2 tablets by mouth every 4 (four) hours as needed. 10/23/13   Rolan Bucco, MD  oxyCODONE-acetaminophen (PERCOCET/ROXICET) 5-325 MG per tablet Take 1 tablet by  mouth every 4 (four) hours as needed. 10/29/13   Kristen N Ward, DO  topiramate (TOPAMAX) 50 MG tablet Take 50 mg by mouth 2 (two) times daily.    Historical Provider, MD   Triage Vitals; BP 150/80  Pulse 72  Temp(Src) 98 F (36.7 C) (Oral)  Resp 18  Ht 5\' 7"  (1.702 m)  Wt 230 lb (104.327 kg)  BMI 36.01 kg/m2  SpO2 100%  Physical Exam  Nursing note and vitals reviewed. Constitutional: She is oriented to person, place, and time. She appears well-developed and well-nourished. No distress.  HENT:  Head: Normocephalic and atraumatic.  Eyes: Conjunctivae  and EOM are normal.  Neck: Normal range of motion. No tracheal deviation present.  Cardiovascular: Normal rate.   Pulmonary/Chest: Effort normal. No respiratory distress.  Abdominal: She exhibits no distension and no mass. There is tenderness. There is no rebound and no guarding.  Mild lower abdominal pain  Genitourinary: Right adnexum displays no mass, no tenderness and no fullness. Left adnexum displays no mass, no tenderness and no fullness.  Mild bleeding coming from the cervical os. Os is closed.  Musculoskeletal: Normal range of motion.  Neurological: She is alert and oriented to person, place, and time.  Skin: Skin is warm and dry.  Psychiatric: She has a normal mood and affect. Her behavior is normal.    ED Course  Procedures (including critical care time) DIAGNOSTIC STUDIES: Oxygen Saturation is 100% on RA, normal by my interpretation.    COORDINATION OF CARE: 10:00 PM-Discussed treatment plan which includes BMB and CBC panel with pt at bedside and pt agreed to plan.     Labs Review Labs Reviewed  CBC - Abnormal; Notable for the following:    Hemoglobin 11.9 (*)    All other components within normal limits  BASIC METABOLIC PANEL - Abnormal; Notable for the following:    GFR calc non Af Amer 85 (*)    All other components within normal limits    Imaging Review No results found.   EKG Interpretation None      MDM   Final diagnoses:  Post-menopausal bleeding   48 year old female presents with postmenopausal vaginal bleeding. Began 3 days ago. Having mild dizziness. No clots, but soaking through a pad every hour to hour and a half. Patient is not on any blood thinners and does not have any bleeding dyscrasias. Vitals stable. Mild blood from the os, no tears. No diffuse gushing of blood, no blood clots. Patient has stable hemoglobin 11.9. Discharge followup with OB for endometrial biopsy.     I personally performed the services described in this documentation,  which was scribed in my presence. The recorded information has been reviewed and is accurate.       Dagmar HaitWilliam Zuri Lascala, MD 01/01/14 Jacinta [id number]

## 2013-12-31 NOTE — ED Notes (Signed)
MD at bedside. 

## 2013-12-31 NOTE — ED Notes (Signed)
Pt c/o vaginal heavy bleeding x 3 days, pt also c/o dizziness

## 2013-12-31 NOTE — Discharge Instructions (Signed)
Abnormal Uterine Bleeding Abnormal uterine bleeding means bleeding from the vagina that is not your normal menstrual period. This can be:  Bleeding or spotting between periods.  Bleeding after sex (sexual intercourse).  Bleeding that is heavier or more than normal.  Periods that last longer than usual.  Bleeding after menopause. There are many problems that may cause this. Treatment will depend on the cause of the bleeding. Any kind of bleeding that is not normal should be reviewed by your doctor.  HOME CARE Watch your condition for any changes. These actions may lessen any discomfort you are having:  Do not use tampons or douches as told by your doctor.  Change your pads often. You should get regular pelvic exams and Pap tests. Keep all appointments for tests as told by your doctor. GET HELP IF:  You are bleeding for more than 1 week.  You feel dizzy at times. GET HELP RIGHT AWAY IF:   You pass out.  You have to change pads every 15 to 30 minutes.  You have belly pain.  You have a fever.  You become sweaty or weak.  You are passing large blood clots from the vagina.  You feel sick to your stomach (nauseous) and throw up (vomit). MAKE SURE YOU:  Understand these instructions.  Will watch your condition.  Will get help right away if you are not doing well or get worse. Document Released: 05/07/2009 Document Revised: 04/30/2013 Document Reviewed: 02/06/2013 [employer] Patient Information 2014 [employer].  

## 2014-01-08 ENCOUNTER — Telehealth: Payer: Self-pay | Admitting: Obstetrics and Gynecology

## 2014-01-08 NOTE — Telephone Encounter (Signed)
LMTCB re: scheduling a new patient doctor referral appointment.  Reminder: Verify whether patient will be self pay.

## 2014-01-12 NOTE — Telephone Encounter (Signed)
Scheduled

## 2014-02-12 ENCOUNTER — Ambulatory Visit: Payer: Self-pay | Admitting: Obstetrics and Gynecology

## 2014-02-17 ENCOUNTER — Observation Stay (HOSPITAL_COMMUNITY)
Admission: EM | Admit: 2014-02-17 | Discharge: 2014-02-17 | Disposition: A | Discharge status: Home / Self Care | Payer: Managed Care, Other (non HMO) | Attending: Internal Medicine | Admitting: Internal Medicine

## 2014-02-17 ENCOUNTER — Encounter (HOSPITAL_COMMUNITY): Payer: Self-pay | Admitting: Emergency Medicine

## 2014-02-17 ENCOUNTER — Emergency Department (HOSPITAL_COMMUNITY): Payer: Managed Care, Other (non HMO)

## 2014-02-17 ENCOUNTER — Other Ambulatory Visit: Payer: Self-pay

## 2014-02-17 DIAGNOSIS — K219 Gastro-esophageal reflux disease without esophagitis: Secondary | ICD-10-CM | POA: Diagnosis not present

## 2014-02-17 DIAGNOSIS — Z79899 Other long term (current) drug therapy: Secondary | ICD-10-CM | POA: Diagnosis not present

## 2014-02-17 DIAGNOSIS — R079 Chest pain, unspecified: Principal | ICD-10-CM | POA: Insufficient documentation

## 2014-02-17 DIAGNOSIS — G43909 Migraine, unspecified, not intractable, without status migrainosus: Secondary | ICD-10-CM | POA: Insufficient documentation

## 2014-02-17 DIAGNOSIS — R072 Precordial pain: Secondary | ICD-10-CM

## 2014-02-17 DIAGNOSIS — I10 Essential (primary) hypertension: Secondary | ICD-10-CM | POA: Diagnosis not present

## 2014-02-17 LAB — BASIC METABOLIC PANEL
ANION GAP: 13 (ref 5–15)
BUN: 14 mg/dL (ref 6–23)
CO2: 25 meq/L (ref 19–32)
Calcium: 9.5 mg/dL (ref 8.4–10.5)
Chloride: 101 mEq/L (ref 96–112)
Creatinine, Ser: 0.86 mg/dL (ref 0.50–1.10)
GFR calc Af Amer: >90 mL/min (ref >90)
GFR calc non Af Amer: 78 mL/min — ABNORMAL LOW (ref >90)
Glucose, Bld: 105 mg/dL — ABNORMAL HIGH (ref 70–99)
Potassium: 3.6 mEq/L — ABNORMAL LOW (ref 3.7–5.3)
Sodium: 139 mEq/L (ref 137–147)

## 2014-02-17 LAB — CBC
HEMATOCRIT: 37.2 % (ref 36.0–46.0)
HEMOGLOBIN: 11.9 g/dL — AB (ref 12.0–15.0)
MCH: 27.1 pg (ref 26.0–34.0)
MCHC: 32 g/dL (ref 30.0–36.0)
MCV: 84.7 fL (ref 78.0–100.0)
PLATELETS: 289 10*3/uL (ref 150–400)
RBC: 4.39 MIL/uL (ref 3.87–5.11)
RDW: 13.2 % (ref 11.5–15.5)
WBC: 6.6 10*3/uL (ref 4.0–10.5)

## 2014-02-17 LAB — PRO B NATRIURETIC PEPTIDE: Pro B Natriuretic peptide (BNP): 26.8 pg/mL (ref 0–125)

## 2014-02-17 LAB — I-STAT TROPONIN, ED
Troponin i, poc: 0.01 ng/mL (ref 0.00–0.08)
Troponin i, poc: 0.01 ng/mL (ref 0.00–0.08)

## 2014-02-17 LAB — TROPONIN I
Troponin I: <0.3 ng/mL (ref <0.30)
Troponin I: <0.3 ng/mL (ref <0.30)

## 2014-02-17 LAB — D-DIMER, QUANTITATIVE: D-Dimer, Quant: 0.44 ug/mL-FEU (ref 0.00–0.48)

## 2014-02-17 MED ORDER — TOPIRAMATE 25 MG PO TABS
50.0000 mg | ORAL_TABLET | Freq: Two times a day (BID) | ORAL | Status: DC
  Filled 2014-02-17 (×2): qty 2

## 2014-02-17 MED ORDER — SODIUM CHLORIDE 0.9 % IJ SOLN: 3.0000 mL | Freq: Two times a day (BID) | INTRAMUSCULAR | Status: DC

## 2014-02-17 MED ORDER — ONDANSETRON HCL 4 MG/2ML IJ SOLN: 4.0000 mg | Freq: Four times a day (QID) | INTRAMUSCULAR | Status: DC | PRN

## 2014-02-17 MED ORDER — HYDROCODONE-ACETAMINOPHEN 5-325 MG PO TABS
1.0000 | ORAL_TABLET | ORAL | Status: DC | PRN
  Administered 2014-02-17: 2 via ORAL
  Administered 2014-02-17: 1 via ORAL
  Filled 2014-02-17: qty 1
  Filled 2014-02-17: qty 2

## 2014-02-17 MED ORDER — ONDANSETRON HCL 4 MG PO TABS: 4.0000 mg | ORAL_TABLET | Freq: Four times a day (QID) | ORAL | Status: DC | PRN

## 2014-02-17 MED ORDER — PANTOPRAZOLE SODIUM 40 MG PO TBEC: 40.0000 mg | DELAYED_RELEASE_TABLET | Freq: Every day | ORAL | Status: DC

## 2014-02-17 MED ORDER — MORPHINE SULFATE 4 MG/ML IJ SOLN
4.0000 mg | Freq: Once | INTRAMUSCULAR | Status: AC
  Administered 2014-02-17: 4 mg via INTRAVENOUS
  Filled 2014-02-17: qty 1

## 2014-02-17 MED ORDER — ENOXAPARIN SODIUM 40 MG/0.4ML ~~LOC~~ SOLN
40.0000 mg | SUBCUTANEOUS | Status: DC
  Filled 2014-02-17: qty 0.4

## 2014-02-17 MED ORDER — MORPHINE SULFATE 2 MG/ML IJ SOLN
2.0000 mg | INTRAMUSCULAR | Status: DC | PRN
  Administered 2014-02-17 (×2): 2 mg via INTRAVENOUS
  Filled 2014-02-17 (×2): qty 1

## 2014-02-17 MED ORDER — FENTANYL CITRATE 0.05 MG/ML IJ SOLN
50.0000 ug | Freq: Once | INTRAMUSCULAR | Status: AC
  Administered 2014-02-17: 50 ug via INTRAVENOUS
  Filled 2014-02-17: qty 2

## 2014-02-17 MED ORDER — ALUM & MAG HYDROXIDE-SIMETH 200-200-20 MG/5ML PO SUSP
30.0000 mL | Freq: Four times a day (QID) | ORAL | Status: DC | PRN
  Administered 2014-02-17: 30 mL via ORAL
  Filled 2014-02-17: qty 30

## 2014-02-17 MED ORDER — ENOXAPARIN SODIUM 60 MG/0.6ML ~~LOC~~ SOLN
55.0000 mg | SUBCUTANEOUS | Status: DC
  Filled 2014-02-17: qty 0.6

## 2014-02-17 MED ORDER — MORPHINE SULFATE 2 MG/ML IJ SOLN: 1.0000 mg | INTRAMUSCULAR | Status: DC | PRN

## 2014-02-17 MED ORDER — SODIUM CHLORIDE 0.9 % IV SOLN
INTRAVENOUS | Status: DC
  Administered 2014-02-17: 08:00:00 via INTRAVENOUS

## 2014-02-17 MED ORDER — ASPIRIN 81 MG PO CHEW
324.0000 mg | CHEWABLE_TABLET | Freq: Once | ORAL | Status: AC
  Administered 2014-02-17: 324 mg via ORAL
  Filled 2014-02-17: qty 4

## 2014-02-17 MED ORDER — SODIUM CHLORIDE 0.9 % IV SOLN
250.0000 mL | INTRAVENOUS | Status: DC | PRN
Start: 2014-02-17 — End: 2014-02-17

## 2014-02-17 MED ORDER — POLYETHYLENE GLYCOL 3350 17 G PO PACK
17.0000 g | PACK | Freq: Every day | ORAL | Status: DC | PRN
  Filled 2014-02-17: qty 1

## 2014-02-17 MED ORDER — ZOLPIDEM TARTRATE 5 MG PO TABS: 5.0000 mg | ORAL_TABLET | Freq: Every evening | ORAL | Status: DC | PRN

## 2014-02-17 MED ORDER — PANTOPRAZOLE SODIUM 40 MG PO TBEC
40.0000 mg | DELAYED_RELEASE_TABLET | Freq: Every day | ORAL | Status: DC
  Administered 2014-02-17: 40 mg via ORAL
  Filled 2014-02-17: qty 1

## 2014-02-17 MED ORDER — NITROGLYCERIN 0.4 MG SL SUBL: 0.4000 mg | SUBLINGUAL_TABLET | SUBLINGUAL | Status: DC | PRN

## 2014-02-17 MED ORDER — SODIUM CHLORIDE 0.9 % IJ SOLN: 3.0000 mL | INTRAMUSCULAR | Status: DC | PRN

## 2014-02-17 MED ORDER — ASPIRIN EC 325 MG PO TBEC: 325.0000 mg | DELAYED_RELEASE_TABLET | Freq: Every day | ORAL | Status: DC

## 2014-02-17 NOTE — H&P (Signed)
Physician Admission History and Physical     PCP:  Aquita Simmering   Chief Complaint:  Chest pain  HPI: Monique Tyler is an [age] y.o. female. Pt w/ hx of migraines, GERD, NSAID use, HTN who presents w/ 3 days of chest pain / pressure . States feels like something sitting on her chest. Pain is constant w/ o relieving factors. Did have some dyspnea w/ the pressure. Located in her epigastrium w/ some radiation to back. BP remains normal . States in addition, her L arm feels numb. Pain has not been relieved w/ percocet, ibuprofen 800mg . She is on nothing for GERD and has taken no tums, PPI. Vitals remain stable in ED along w/ EKG and trop neg for ischemic change. Will admit for obs to r/o ACS .   Review of Systems:  Neg except as noted above  Past Medical History: Past Medical History  Diagnosis Date  . Migraine   . GERD (gastroesophageal reflux disease)   . Lung nodules    Past Surgical History  Procedure Laterality Date  . Cesarean section    . Mandible surgery    . Tubal ligation      Medications: Prior to Admission medications   Medication Sig Start Date End Date Taking? Authorizing Provider  cholecalciferol (VITAMIN D) 1000 UNITS tablet Take 5,000 Units by mouth daily.   Yes Historical Provider, MD  ibuprofen (ADVIL,MOTRIN) 800 MG tablet Take 1 tablet (800 mg total) by mouth every 8 (eight) hours as needed for mild pain. 10/29/13  Yes Kristen N Ward, DO  oxyCODONE-acetaminophen (PERCOCET) 5-325 MG per tablet Take 2 tablets by mouth every 4 (four) hours as needed. 10/23/13  Yes Rolan BuccoMelanie Belfi, MD  topiramate (TOPAMAX) 50 MG tablet Take 50 mg by mouth 2 (two) times daily.   Yes Historical Provider, MD    Allergies:   Allergies  Allergen Reactions  . Shellfish Allergy Swelling  . Pork-Derived Products Nausea And Vomiting    Social History:  reports that she has never smoked. She has never used smokeless tobacco. She reports that she does not drink alcohol or use illicit  drugs.  Family History: No family history on file.  Physical Exam: Filed Vitals:   02/17/14 0138 02/17/14 0448  BP: 132/65 119/56  Pulse: 84   Temp: 98.9 F (37.2 C) 98.8 F (37.1 C)  TempSrc: Oral   Resp: 18 20  Height: 5\' 7"  (1.702 m)   Weight: 240 lb (108.863 kg)   SpO2: 100% 98%   Gen: AAF in mild distress  HEENT: no JVD, MMM, anicteric  Lungs: CTAB, no wheezes, rales  Cardio:  RRR, no MRG   Abd: soft, NT, ND MSK: no focal deficits. Normal strength  Neuro:  AAOx 3  Skin: warm, dry     Labs on Admission:   Recent Labs  02/17/14 0211  NA 139  K 3.6*  CL 101  CO2 25  GLUCOSE 105*  BUN 14  CREATININE 0.86  CALCIUM 9.5   No results found for this basename: AST, ALT, ALKPHOS, BILITOT, PROT, ALBUMIN,  in the last 72 hours No results found for this basename: LIPASE, AMYLASE,  in the last 72 hours  Recent Labs  02/17/14 0211  WBC 6.6  HGB 11.9*  HCT 37.2  MCV 84.7  PLT 289   No results found for this basename: CKTOTAL, CKMB, CKMBINDEX, TROPONINI,  in the last 72 hours Lab Results  Component Value Date   INR 1.29 05/02/2011   INR 1.2 12/08/2007  No results found for this basename: TSH, T4TOTAL, FREET3, T3FREE, THYROIDAB,  in the last 72 hours No results found for this basename: VITAMINB12, FOLATE, FERRITIN, TIBC, IRON, RETICCTPCT,  in the last 72 hours  Radiological Exams on Admission: Dg Chest 2 View  02/17/2014   CLINICAL DATA:  Chest pain radiating to the left arm for 3 days.  EXAM: CHEST  2 VIEW  COMPARISON:  05/28/2013  FINDINGS: The heart size and mediastinal contours are within normal limits. Both lungs are clear. The visualized skeletal structures are unremarkable.  IMPRESSION: No active cardiopulmonary disease.   Electronically Signed   By: Burman Nieves M.D.   On: 02/17/2014 02:42   Orders placed during the hospital encounter of 02/17/14  . ED EKG  . ED EKG  . EKG 12-LEAD    Assessment/Plan Active Problems:   Chest pain  - admit to  obs. trop and EKG unremarkable for ischemia in ED. Repeat trop x 2 on the floor to r/o ACS. Place on tele. Order STAT TTE to r/o wall motion abnormality. Nitro, percocet, morphine prn pain. She is low cardiac risk based on hx. No risk fx for PE either. If all above w/u negative, likely 2/2 gastritis given recent NSAID use in pt w/ GERD.   Monique Tyler 02/17/2014, 7:19 AM

## 2014-02-17 NOTE — Progress Notes (Signed)
Report received from H. Holderness,RN. Agree with a.m. Assessment. 

## 2014-02-17 NOTE — Discharge Summary (Signed)
Physician Discharge Summary    Monique Tyler  MR#: [id number]  DOB:1966/02/13  Date of Admission: 02/17/2014 Date of Discharge: 02/17/2014  Attending Physician:Keisha Amer  Patient's WGN:[id number],HQIONG, MD  Consults:  none   Discharge Diagnoses: Active Problems:   Chest pain   Discharge Medications:   Medication List         cholecalciferol 1000 UNITS tablet  Commonly known as:  VITAMIN D  Take 5,000 Units by mouth daily.     ibuprofen 800 MG tablet  Commonly known as:  ADVIL,MOTRIN  Take 1 tablet (800 mg total) by mouth every 8 (eight) hours as needed for mild pain.     oxyCODONE-acetaminophen 5-325 MG per tablet  Commonly known as:  PERCOCET  Take 2 tablets by mouth every 4 (four) hours as needed.     pantoprazole 40 MG tablet  Commonly known as:  PROTONIX  Take 1 tablet (40 mg total) by mouth daily at 6 (six) AM.     topiramate 50 MG tablet  Commonly known as:  TOPAMAX  Take 50 mg by mouth 2 (two) times daily.        Hospital Procedures: Dg Chest 2 View  02/17/2014   CLINICAL DATA:  Chest pain radiating to the left arm for 3 days.  EXAM: CHEST  2 VIEW  COMPARISON:  05/28/2013  FINDINGS: The heart size and mediastinal contours are within normal limits. Both lungs are clear. The visualized skeletal structures are unremarkable.  IMPRESSION: No active cardiopulmonary disease.   Electronically Signed   By: Burman Nieves M.D.   On: 02/17/2014 02:42    History of Present Illness: Admitted for ACS r/o   Hospital Course: Pt presents for CP described in H&P. Trop neg x 3 , EKG w/o signs of ischemia , and TTE ordered that showed no wall motion abnormalities. ACS has been ruled out and patient will f/u tomorrow in my office to further w/u the pain . Take PPI daily and percocet prn .   Day of Discharge Exam BP 130/61  Pulse 57  Temp(Src) 98.1 F (36.7 C) (Oral)  Resp 19  Ht 5\' 7"  (1.702 m)  Wt 251 lb 5.2 oz (114 kg)  BMI 39.35 kg/m2  SpO2  98%  Physical Exam: Gen: AAF in mild distress  HEENT: no JVD, MMM, anicteric  Lungs: CTAB, no wheezes, rales  Cardio: RRR, no MRG  Abd: soft, NT, ND  MSK: no focal deficits. Normal strength  Neuro: AAOx 3  Skin: warm, dry    Discharge Labs:  Recent Labs  02/17/14 0211  NA 139  K 3.6*  CL 101  CO2 25  GLUCOSE 105*  BUN 14  CREATININE 0.86  CALCIUM 9.5   No results found for this basename: AST, ALT, ALKPHOS, BILITOT, PROT, ALBUMIN,  in the last 72 hours  Recent Labs  02/17/14 0211  WBC 6.6  HGB 11.9*  HCT 37.2  MCV 84.7  PLT 289   Lab Results  Component Value Date   INR 1.29 05/02/2011   INR 1.2 12/08/2007    Recent Labs  02/17/14 0745  TROPONINI <0.30   No results found for this basename: TSH, T4TOTAL, FREET3, T3FREE, THYROIDAB,  in the last 72 hours No results found for this basename: VITAMINB12, FOLATE, FERRITIN, TIBC, IRON, RETICCTPCT,  in the last 72 hours  Discharge instructions:     Discharge Instructions   Call MD for:  severe uncontrolled pain    Complete by:  As directed  Diet - low sodium heart healthy    Complete by:  As directed      Increase activity slowly    Complete by:  As directed           01-Home or Self Care   Disposition: stable   Follow-up Appts: [facility] at [phone].   Condition on Discharge: stable  Tests Needing Follow-up:none   Time spent in discharge (includes decision making & examination of pt): 45 minutes    Signed: Cherita Hebel 02/17/2014, 12:23 PM

## 2014-02-17 NOTE — ED Notes (Signed)
Patient c/o left sided chest pain that radiates into her left shoulder. Patient states pain started 3 days ago and is described as "something sitting on my chest". Patient has taken ibuprofen at home for the discomfort. Patient states she also had percocet at home (from prior flank pain). Patient states neither of these medications changed her pain.

## 2014-02-17 NOTE — ED Provider Notes (Signed)
CSN: [id number]     Arrival date & time 02/17/14  0127 History   First MD Initiated Contact with Patient 02/17/14 0208     Chief Complaint  Patient presents with  . Chest Pain     (Consider location/radiation/quality/duration/timing/severity/associated sxs/prior Treatment) HPI Comments: Patient presents today with a chief complaint of chest pain.  She reports that the pain has been constant for the past 3 days.  Pain is located left anterior chest and radiates to her left shoulder.  She describes the pain as a "heaviness."  She reports mild associated SOB.  She denies nausea, vomiting, abdominal pain, cough, fever, or chills.  She does reports associated numbness of her left arm.  She reports that the pain does become mildly worse with exertion.  She denies prior cardiac history.  Denies history of HTN, DM, Hyperlipidemia.  She denies any family history of Cardiac Disease.  She does not smoke.  Denies history of PE or DVT.  Denies prolonged travel or surgeries in the past 4 weeks.  Denies use of estrogen containing medications.  Denies LE edema or pain.    Patient is a 48 y.o. female presenting with chest pain. The history is provided by the patient.  Chest Pain Associated symptoms: numbness and shortness of breath     Past Medical History  Diagnosis Date  . Migraine   . GERD (gastroesophageal reflux disease)   . Lung nodules    Past Surgical History  Procedure Laterality Date  . Cesarean section    . Mandible surgery    . Tubal ligation     No family history on file. History  Substance Use Topics  . Smoking status: Never Smoker   . Smokeless tobacco: Never Used  . Alcohol Use: No   OB History   Grav Para Term Preterm Abortions TAB SAB Ect Mult Living   5 5       1       Review of Systems  Respiratory: Positive for shortness of breath.   Cardiovascular: Positive for chest pain.  Neurological: Positive for numbness.  All other systems reviewed and are  negative.     Allergies  Shellfish allergy and Pork-derived products  Home Medications   Prior to Admission medications   Medication Sig Start Date End Date Taking? Authorizing Provider  cholecalciferol (VITAMIN D) 1000 UNITS tablet Take 5,000 Units by mouth daily.   Yes Historical Provider, MD  ibuprofen (ADVIL,MOTRIN) 800 MG tablet Take 1 tablet (800 mg total) by mouth every 8 (eight) hours as needed for mild pain. 10/29/13  Yes Kristen N Ward, DO  oxyCODONE-acetaminophen (PERCOCET) 5-325 MG per tablet Take 2 tablets by mouth every 4 (four) hours as needed. 10/23/13  Yes Rolan Bucco, MD  topiramate (TOPAMAX) 50 MG tablet Take 50 mg by mouth 2 (two) times daily.   Yes Historical Provider, MD   BP 132/65  Pulse 84  Temp(Src) 98.9 F (37.2 C) (Oral)  Resp 18  Ht 5\' 7"  (1.702 m)  Wt 240 lb (108.863 kg)  BMI 37.58 kg/m2  SpO2 100% Physical Exam  Nursing note and vitals reviewed. Constitutional: She appears well-developed and well-nourished.  HENT:  Head: Normocephalic and atraumatic.  Mouth/Throat: Oropharynx is clear and moist.  Neck: Normal range of motion. Neck supple.  Cardiovascular: Normal rate, regular rhythm, normal heart sounds and intact distal pulses.   Pulses:      Dorsalis pedis pulses are 2+ on the right side, and 2+ on the left side.  Pulmonary/Chest: Effort normal and breath sounds normal. No respiratory distress. She has no wheezes. She has no rales. She exhibits no tenderness.  Abdominal: Bowel sounds are normal. She exhibits no distension and no mass. There is no tenderness. There is no rebound and no guarding.  Musculoskeletal: Normal range of motion.  No LE edema or erythema. Negative Homan's sign bilaterally.    Neurological: She is alert.  Skin: Skin is warm and dry.  Psychiatric: She has a normal mood and affect.    ED Course  Procedures (including critical care time) Labs Review Labs Reviewed  CBC  BASIC METABOLIC PANEL  PRO B NATRIURETIC PEPTIDE   I-STAT TROPOININ, ED    Imaging Review Dg Chest 2 View  02/17/2014   CLINICAL DATA:  Chest pain radiating to the left arm for 3 days.  EXAM: CHEST  2 VIEW  COMPARISON:  05/28/2013  FINDINGS: The heart size and mediastinal contours are within normal limits. Both lungs are clear. The visualized skeletal structures are unremarkable.  IMPRESSION: No active cardiopulmonary disease.   Electronically Signed   By: Burman NievesWilliam  Stevens M.D.   On: 02/17/2014 02:42     EKG Interpretation None      5:59 AM Discussed with Dr. Evlyn KannerSouth.  He reports that Dr. Link SnufferHolwerda will come see the patient in the ED.   Date: 02/17/2014  Rate: 81  Rhythm: normal sinus rhythm  QRS Axis: normal  Intervals: normal  ST/T Wave abnormalities: normal  Conduction Disutrbances:none  Narrative Interpretation:   Old EKG Reviewed: none available 5:00 PM Reassessed patient.  She reports that she is still having pain after the ASA and the Morphine.  Will order more pain medication and reassess.  MDM   Final diagnoses:  None   Patient presenting today with a chief complaint of chest pain x 3 days.  Pain worse with exertion and radiating to her left arm.  No ischemic changes on EKG.  Initial and 3 hour troponin negative.  CXR negative.  Cardiac risk factors are obesity.  Patient discussed with Dr. Rhunette CroftNanavati who also evaluated the patient.  [facility] come see the patient in the ED for possible admission.      Santiago GladHeather Devinne Epstein, PA-C 02/18/14 1339

## 2014-02-17 NOTE — Progress Notes (Signed)
Echocardiogram 2D Echocardiogram has been performed.  Monique Tyler 02/17/2014, 9:16 AM

## 2014-02-17 NOTE — Care Management Note (Addendum)
    Page 1 of 1   02/17/2014     1:28:30 PM CARE MANAGEMENT NOTE 02/17/2014  Patient:  Monique Tyler,Monique Tyler   Account Number:  [id number]  Date Initiated:  02/17/2014  Documentation initiated by:  Lanier ClamMAHABIR,Kashius Dominic  Subjective/Objective Assessment:   48 Y/O F ADMITTED W/CHEST PAIN.     Action/Plan:   FROM HOME.   Anticipated DC Date:  02/17/2014   Anticipated DC Plan:  HOME/SELF CARE      DC Planning Services  CM consult      Choice offered to / List presented to:             Status of service:  Completed, signed off Medicare Important Message given?   (If response is "NO", the following Medicare IM given date fields will be blank) Date Medicare IM given:   Medicare IM given by:   Date Additional Medicare IM given:   Additional Medicare IM given by:    Discharge Disposition:  HOME/SELF CARE  Per UR Regulation:  Reviewed for med. necessity/level of care/duration of stay  If discussed at Long Length of Stay Meetings, dates discussed:    Comments:  02/17/14 Floreine Kingdon RN,BSN NCM [phone] MEETS OBSV.NO ANTICIPATED D/C NEEDS.

## 2014-02-23 NOTE — ED Provider Notes (Signed)
Medical screening examination/treatment/procedure(s) were conducted as a shared visit with non-physician practitioner(s) and myself.  I personally evaluated the patient during the encounter.   EKG Interpretation None      Pt comes in with very typical sounding chest pain. Cardiovascular exam is benign. Consulting PCP to ensure they can get her seen soon vs. Admission. Stable with neg trops.  Derwood KaplanAnkit Demetrice Combes, MD 02/23/14 1517

## 2014-05-04 ENCOUNTER — Emergency Department (HOSPITAL_BASED_OUTPATIENT_CLINIC_OR_DEPARTMENT_OTHER): Payer: Managed Care, Other (non HMO)

## 2014-05-04 ENCOUNTER — Encounter (HOSPITAL_BASED_OUTPATIENT_CLINIC_OR_DEPARTMENT_OTHER): Payer: Self-pay | Admitting: Emergency Medicine

## 2014-05-04 DIAGNOSIS — M545 Low back pain: Secondary | ICD-10-CM | POA: Diagnosis not present

## 2014-05-04 DIAGNOSIS — K219 Gastro-esophageal reflux disease without esophagitis: Secondary | ICD-10-CM | POA: Insufficient documentation

## 2014-05-04 DIAGNOSIS — Z79899 Other long term (current) drug therapy: Secondary | ICD-10-CM | POA: Diagnosis not present

## 2014-05-04 DIAGNOSIS — R300 Dysuria: Secondary | ICD-10-CM | POA: Diagnosis not present

## 2014-05-04 DIAGNOSIS — G43909 Migraine, unspecified, not intractable, without status migrainosus: Secondary | ICD-10-CM | POA: Diagnosis not present

## 2014-05-04 DIAGNOSIS — J40 Bronchitis, not specified as acute or chronic: Secondary | ICD-10-CM | POA: Diagnosis not present

## 2014-05-04 DIAGNOSIS — R05 Cough: Secondary | ICD-10-CM | POA: Diagnosis present

## 2014-05-04 NOTE — ED Notes (Signed)
Fever and non productive cough x 4 days. States her chest is tight with the cough and it is hard for her to breath. Speaking in complete sentences. Drove herself here.

## 2014-05-05 ENCOUNTER — Emergency Department (HOSPITAL_BASED_OUTPATIENT_CLINIC_OR_DEPARTMENT_OTHER)
Admission: EM | Admit: 2014-05-05 | Discharge: 2014-05-05 | Disposition: A | Discharge status: Home / Self Care | Payer: Managed Care, Other (non HMO) | Attending: Emergency Medicine | Admitting: Emergency Medicine

## 2014-05-05 DIAGNOSIS — J40 Bronchitis, not specified as acute or chronic: Secondary | ICD-10-CM

## 2014-05-05 LAB — URINALYSIS, ROUTINE W REFLEX MICROSCOPIC
BILIRUBIN URINE: NEGATIVE
GLUCOSE, UA: NEGATIVE mg/dL
HGB URINE DIPSTICK: NEGATIVE
Ketones, ur: NEGATIVE mg/dL
Nitrite: NEGATIVE
Protein, ur: NEGATIVE mg/dL
SPECIFIC GRAVITY, URINE: 1.014 (ref 1.005–1.030)
Urobilinogen, UA: 0.2 mg/dL (ref 0.0–1.0)
pH: 7.5 (ref 5.0–8.0)

## 2014-05-05 LAB — URINE MICROSCOPIC-ADD ON

## 2014-05-05 MED ORDER — BENZONATATE 100 MG PO CAPS: 100.0000 mg | ORAL_CAPSULE | Freq: Three times a day (TID) | ORAL | Status: DC

## 2014-05-05 MED ORDER — ALBUTEROL SULFATE HFA 108 (90 BASE) MCG/ACT IN AERS
2.0000 | INHALATION_SPRAY | RESPIRATORY_TRACT | Status: DC | PRN
  Administered 2014-05-05: 2 via RESPIRATORY_TRACT
  Filled 2014-05-05: qty 6.7

## 2014-05-05 NOTE — ED Provider Notes (Signed)
Medical screening examination/treatment/procedure(s) were performed by non-physician practitioner and as supervising physician I was immediately available for consultation/collaboration.   Ferne Ellingwood, MD 05/05/14 0644 

## 2014-05-05 NOTE — Discharge Instructions (Signed)
Please read and follow all provided instructions.  Your diagnoses today include:  1. Bronchitis     Tests performed today include:  Chest x-ray - does not show any pneumonia  Urine test - no definite infection  EKG - normal, unchanged from old EKG  Vital signs. See below for your results today.   Medications prescribed:   Albuterol inhaler - medication that opens up your airway  Use inhaler as follows: 1-2 puffs with spacer every 4 hours as needed for wheezing, cough, or shortness of breath.    Tessalon Perles - cough suppressant medication  Take any prescribed medications only as directed.  Home care instructions:  Follow any educational materials contained in this packet.  Follow-up instructions: Please follow-up with your primary care provider in the next 3 days for further evaluation of your symptoms and a recheck if you are not feeling better.   Return instructions:   Please return to the Emergency Department if you experience worsening symptoms.  Please return with worsening wheezing, shortness of breath, or difficulty breathing.  Return with persistent fever above 101F.   Please return if you have any other emergent concerns.  Additional Information:  Your vital signs today were: BP 143/88   Pulse 72   Temp(Src) 98.4 F (36.9 C) (Oral)   Resp 20   Ht 5\' 7"  (1.702 m)   Wt 245 lb (111.131 kg)   BMI 38.36 kg/m2   SpO2 98% If your blood pressure (BP) was elevated above 135/85 this visit, please have this repeated by your doctor within one month. --------------

## 2014-05-05 NOTE — ED Provider Notes (Signed)
CSN: [id number]     Arrival date & time 05/04/14  2218 History   First MD Initiated Contact with Patient 05/05/14 0020     Chief Complaint  Patient presents with  . Fever  . Cough     (Consider location/radiation/quality/duration/timing/severity/associated sxs/prior Treatment) HPI Comments: Patient presents with complaint of nonproductive cough and subjective fever for 4 days. Patient describes a tightness in her chest when she coughs with some shortness of breath. She notes some wheezing at times. No nasal congestion or rhinorrhea. She does have a sore throat. No nausea, vomiting, or diarrhea. Patient had a brief admission in July for chest tightness that her PCP thought was likely GI in nature. Patient has a history of frequent urinary tract infections and has noted slight burning with urination. She has had some lower back pain which is worse with coughing. Patient denies risk factors for pulmonary embolism including: unilateral leg swelling, history of DVT/PE/other blood clots, use of estrogens, recent immobilizations, recent surgery, recent travel (>4hr segment), malignancy, hemoptysis. No treatments prior to arrival.     Patient is a 48 y.o. female presenting with fever and cough. The history is provided by the patient and medical records.  Fever Associated symptoms: cough, dysuria and sore throat   Associated symptoms: no chest pain, no diarrhea, no headaches, no myalgias, no nausea, no rash, no rhinorrhea and no vomiting   Cough Associated symptoms: fever (subjective, no documented fever), shortness of breath, sore throat and wheezing   Associated symptoms: no chest pain, no headaches, no myalgias, no rash and no rhinorrhea     Past Medical History  Diagnosis Date  . Migraine   . GERD (gastroesophageal reflux disease)   . Lung nodules    Past Surgical History  Procedure Laterality Date  . Cesarean section    . Mandible surgery    . Tubal ligation     No family history on  file. History  Substance Use Topics  . Smoking status: Never Smoker   . Smokeless tobacco: Never Used  . Alcohol Use: No   OB History   Grav Para Term Preterm Abortions TAB SAB Ect Mult Living   5 5       1       Review of Systems  Constitutional: Positive for fever (subjective, no documented fever).  HENT: Positive for sore throat. Negative for rhinorrhea.   Eyes: Negative for redness.  Respiratory: Positive for cough, chest tightness, shortness of breath and wheezing.   Cardiovascular: Negative for chest pain and leg swelling.  Gastrointestinal: Negative for nausea, vomiting, abdominal pain and diarrhea.  Genitourinary: Positive for dysuria. Negative for frequency and flank pain.  Musculoskeletal: Negative for myalgias.  Skin: Negative for rash.  Neurological: Negative for headaches.      Allergies  Shellfish allergy and Pork-derived products  Home Medications   Prior to Admission medications   Medication Sig Start Date End Date Taking? Authorizing Provider  cholecalciferol (VITAMIN D) 1000 UNITS tablet Take 5,000 Units by mouth daily.    Historical Provider, MD  ibuprofen (ADVIL,MOTRIN) 800 MG tablet Take 1 tablet (800 mg total) by mouth every 8 (eight) hours as needed for mild pain. 10/29/13   Kristen N Ward, DO  oxyCODONE-acetaminophen (PERCOCET) 5-325 MG per tablet Take 2 tablets by mouth every 4 (four) hours as needed. 10/23/13   Rolan BuccoMelanie Belfi, MD  pantoprazole (PROTONIX) 40 MG tablet Take 1 tablet (40 mg total) by mouth daily at 6 (six) AM. 02/17/14   Alysia PennaScott Holwerda,  MD  topiramate (TOPAMAX) 50 MG tablet Take 50 mg by mouth 2 (two) times daily.    Historical Provider, MD   BP 143/88  Pulse 72  Temp(Src) 98.4 F (36.9 C) (Oral)  Resp 20  Ht 5\' 7"  (1.702 m)  Wt 245 lb (111.131 kg)  BMI 38.36 kg/m2  SpO2 98%  Physical Exam  Nursing note and vitals reviewed. Constitutional: She appears well-developed and well-nourished.  HENT:  Head: Normocephalic and atraumatic.   Right Ear: Tympanic membrane, external ear and ear canal normal.  Left Ear: Tympanic membrane, external ear and ear canal normal.  Nose: Nose normal. No mucosal edema or rhinorrhea.  Mouth/Throat: Uvula is midline, oropharynx is clear and moist and mucous membranes are normal. Mucous membranes are not dry. No oral lesions. No trismus in the jaw. No uvula swelling. No oropharyngeal exudate, posterior oropharyngeal edema, posterior oropharyngeal erythema or tonsillar abscesses.  Eyes: Conjunctivae are normal. Pupils are equal, round, and reactive to light. Right eye exhibits no discharge. Left eye exhibits no discharge.  Neck: Normal range of motion. Neck supple. No JVD present.  Cardiovascular: Normal rate, regular rhythm and normal heart sounds.   No murmur heard. Pulmonary/Chest: Effort normal and breath sounds normal. No respiratory distress. She has no wheezes. She has no rales.  Abdominal: Soft. There is no tenderness. There is no CVA tenderness.  Musculoskeletal: She exhibits tenderness. She exhibits no edema.       Cervical back: She exhibits normal range of motion, no tenderness and no bony tenderness.       Thoracic back: She exhibits tenderness. She exhibits normal range of motion and no bony tenderness.       Lumbar back: She exhibits tenderness. She exhibits normal range of motion and no bony tenderness.  Lower thoracic/upper lumbar paraspinous lumbar tenderness bilaterally.   Lymphadenopathy:    She has no cervical adenopathy.  Neurological: She is alert.  Skin: Skin is warm and dry.  Psychiatric: She has a normal mood and affect.    ED Course  Procedures (including critical care time) Labs Review Labs Reviewed  URINALYSIS, ROUTINE W REFLEX MICROSCOPIC - Abnormal; Notable for the following:    APPearance CLOUDY (*)    Leukocytes, UA TRACE (*)    All other components within normal limits  URINE MICROSCOPIC-ADD ON - Abnormal; Notable for the following:    Squamous  Epithelial / LPF MANY (*)    Bacteria, UA MANY (*)    All other components within normal limits  URINE CULTURE    Imaging Review Dg Chest 2 View  05/04/2014   CLINICAL DATA:  Fever and cough.  Initial encounter  EXAM: CHEST  2 VIEW  COMPARISON:  02/17/2014  FINDINGS: Normal heart size and mediastinal contours. No acute infiltrate or edema. No effusion or pneumothorax. No acute osseous findings.  IMPRESSION: Negative for pneumonia.   Electronically Signed   By: Tiburcio PeaJonathan  Watts M.D.   On: 05/04/2014 22:45     EKG Interpretation None      12:35 PM Patient seen and examined. Pt informed of CXR results. UA added. EKG reviewed, unchanged from previous. Medications ordered.   Vital signs reviewed and are as follows: BP 143/88  Pulse 72  Temp(Src) 98.4 F (36.9 C) (Oral)  Resp 20  Ht 5\' 7"  (1.702 m)  Wt 245 lb (111.131 kg)  BMI 38.36 kg/m2  SpO2 98%   Date: 05/05/2014  Rate: 84  Rhythm: normal sinus rhythm  QRS Axis: normal  Intervals:  normal  ST/T Wave abnormalities: normal  Conduction Disutrbances:none  Narrative Interpretation:   Old EKG Reviewed: none available  1:17 AM Pt informed of UA results.   Patient counseled on use of albuterol HFA. Instructed to use 1-2 puffs q 4 hours as needed for SOB.   Patient urged to return with worsening symptoms or other concerns. Patient verbalized understanding and agrees with plan.    MDM   Final diagnoses:  Bronchitis   Pt with cough, subjective fever, intermittent wheezing, chest tightness suggestive of bronchitis. Doubt ACS with this scenario, previous work-up by PCP, normal appearing EKG. She has chest tightness with cough for 4 days and this would be very atypical presentation. Do not feel additional work-up necessary. Also doubt PE. Pt would be PERC neg if not for her 48th birthday less than 1 month ago. I feel PE is very low likelihood given this constellation of symptoms and would not order d-dimer at this time. Patient with  likely viral bronchitis. No concern for PNA given normal lung exam/x-ray. Antibiotics not indicated. Conservative therapy indicated.    Dysuria -- UA not suggestive of UTI. Culture sent to confirm given patient's history. Would not treat unless culture positive. Patient has some reproducible lower back pain with palpation that I feel is likely muscular from coughing and not suspicious for pyelo.   No dangerous or life-threatening conditions suspected or identified by history, physical exam, and by work-up. No indications for hospitalization identified.       Renne Crigler, PA-C 05/05/14 [phone]

## 2014-05-06 LAB — URINE CULTURE
COLONY COUNT: NO GROWTH
Culture: NO GROWTH

## 2014-05-25 ENCOUNTER — Encounter (HOSPITAL_BASED_OUTPATIENT_CLINIC_OR_DEPARTMENT_OTHER): Payer: Self-pay | Admitting: Emergency Medicine

## 2014-07-27 ENCOUNTER — Encounter (HOSPITAL_BASED_OUTPATIENT_CLINIC_OR_DEPARTMENT_OTHER): Payer: Self-pay

## 2014-07-27 ENCOUNTER — Emergency Department (HOSPITAL_BASED_OUTPATIENT_CLINIC_OR_DEPARTMENT_OTHER): Payer: Managed Care, Other (non HMO)

## 2014-07-27 ENCOUNTER — Emergency Department (HOSPITAL_BASED_OUTPATIENT_CLINIC_OR_DEPARTMENT_OTHER)
Admission: EM | Admit: 2014-07-27 | Discharge: 2014-07-27 | Disposition: A | Discharge status: Home / Self Care | Payer: Managed Care, Other (non HMO) | Attending: Emergency Medicine | Admitting: Emergency Medicine

## 2014-07-27 DIAGNOSIS — N200 Calculus of kidney: Secondary | ICD-10-CM | POA: Insufficient documentation

## 2014-07-27 DIAGNOSIS — Z79899 Other long term (current) drug therapy: Secondary | ICD-10-CM | POA: Diagnosis not present

## 2014-07-27 DIAGNOSIS — G43909 Migraine, unspecified, not intractable, without status migrainosus: Secondary | ICD-10-CM | POA: Diagnosis not present

## 2014-07-27 DIAGNOSIS — Z3202 Encounter for pregnancy test, result negative: Secondary | ICD-10-CM | POA: Diagnosis not present

## 2014-07-27 DIAGNOSIS — Z8719 Personal history of other diseases of the digestive system: Secondary | ICD-10-CM | POA: Insufficient documentation

## 2014-07-27 DIAGNOSIS — R1031 Right lower quadrant pain: Secondary | ICD-10-CM | POA: Insufficient documentation

## 2014-07-27 LAB — CBC WITH DIFFERENTIAL/PLATELET
Basophils Absolute: 0 10*3/uL (ref 0.0–0.1)
Basophils Relative: 0 % (ref 0–1)
Eosinophils Absolute: 0.1 10*3/uL (ref 0.0–0.7)
Eosinophils Relative: 1 % (ref 0–5)
HCT: 40.1 % (ref 36.0–46.0)
Hemoglobin: 12.4 g/dL (ref 12.0–15.0)
LYMPHS PCT: 30 % (ref 12–46)
Lymphs Abs: 2 10*3/uL (ref 0.7–4.0)
MCH: 26.8 pg (ref 26.0–34.0)
MCHC: 30.9 g/dL (ref 30.0–36.0)
MCV: 86.8 fL (ref 78.0–100.0)
MONOS PCT: 10 % (ref 3–12)
Monocytes Absolute: 0.7 10*3/uL (ref 0.1–1.0)
Neutro Abs: 3.9 10*3/uL (ref 1.7–7.7)
Neutrophils Relative %: 59 % (ref 43–77)
PLATELETS: 255 10*3/uL (ref 150–400)
RBC: 4.62 MIL/uL (ref 3.87–5.11)
RDW: 14.1 % (ref 11.5–15.5)
WBC: 6.7 10*3/uL (ref 4.0–10.5)

## 2014-07-27 LAB — COMPREHENSIVE METABOLIC PANEL
ALK PHOS: 120 U/L — AB (ref 39–117)
ALT: 21 U/L (ref 0–35)
AST: 29 U/L (ref 0–37)
Albumin: 4.1 g/dL (ref 3.5–5.2)
Anion gap: 7 (ref 5–15)
BUN: 10 mg/dL (ref 6–23)
CHLORIDE: 109 meq/L (ref 96–112)
CO2: 22 mmol/L (ref 19–32)
Calcium: 9.1 mg/dL (ref 8.4–10.5)
Creatinine, Ser: 0.89 mg/dL (ref 0.50–1.10)
GFR calc Af Amer: 86 mL/min — ABNORMAL LOW (ref >90)
GFR, EST NON AFRICAN AMERICAN: 74 mL/min — AB (ref >90)
Glucose, Bld: 90 mg/dL (ref 70–99)
POTASSIUM: 3.7 mmol/L (ref 3.5–5.1)
SODIUM: 138 mmol/L (ref 135–145)
Total Bilirubin: 0.4 mg/dL (ref 0.3–1.2)
Total Protein: 7.1 g/dL (ref 6.0–8.3)

## 2014-07-27 LAB — URINALYSIS, ROUTINE W REFLEX MICROSCOPIC
Bilirubin Urine: NEGATIVE
GLUCOSE, UA: NEGATIVE mg/dL
KETONES UR: NEGATIVE mg/dL
Nitrite: NEGATIVE
Protein, ur: NEGATIVE mg/dL
Specific Gravity, Urine: 1.007 (ref 1.005–1.030)
UROBILINOGEN UA: 0.2 mg/dL (ref 0.0–1.0)
pH: 6.5 (ref 5.0–8.0)

## 2014-07-27 LAB — URINE MICROSCOPIC-ADD ON

## 2014-07-27 LAB — PREGNANCY, URINE: Preg Test, Ur: NEGATIVE

## 2014-07-27 LAB — WET PREP, GENITAL
Trich, Wet Prep: NONE SEEN
WBC, Wet Prep HPF POC: NONE SEEN
Yeast Wet Prep HPF POC: NONE SEEN

## 2014-07-27 LAB — LIPASE, BLOOD: Lipase: 43 U/L (ref 11–59)

## 2014-07-27 MED ORDER — IOHEXOL 300 MG/ML  SOLN
25.0000 mL | Freq: Once | INTRAMUSCULAR | Status: AC | PRN
  Administered 2014-07-27: 25 mL via ORAL

## 2014-07-27 MED ORDER — TAMSULOSIN HCL 0.4 MG PO CAPS
0.4000 mg | ORAL_CAPSULE | Freq: Once | ORAL | Status: AC
  Administered 2014-07-27: 0.4 mg via ORAL
  Filled 2014-07-27: qty 1

## 2014-07-27 MED ORDER — HYDROMORPHONE HCL 1 MG/ML IJ SOLN
1.0000 mg | Freq: Once | INTRAMUSCULAR | Status: AC
  Administered 2014-07-27: 1 mg via INTRAVENOUS
  Filled 2014-07-27: qty 1

## 2014-07-27 MED ORDER — OXYCODONE-ACETAMINOPHEN 5-325 MG PO TABS: 1.0000 | ORAL_TABLET | Freq: Four times a day (QID) | ORAL | Status: DC | PRN

## 2014-07-27 MED ORDER — ONDANSETRON HCL 4 MG PO TABS: 4.0000 mg | ORAL_TABLET | Freq: Three times a day (TID) | ORAL | Status: DC | PRN

## 2014-07-27 MED ORDER — IOHEXOL 300 MG/ML  SOLN
100.0000 mL | Freq: Once | INTRAMUSCULAR | Status: AC | PRN
  Administered 2014-07-27: 100 mL via INTRAVENOUS

## 2014-07-27 MED ORDER — OXYCODONE-ACETAMINOPHEN 5-325 MG PO TABS
2.0000 | ORAL_TABLET | Freq: Once | ORAL | Status: AC
  Administered 2014-07-27: 2 via ORAL
  Filled 2014-07-27: qty 2

## 2014-07-27 MED ORDER — TAMSULOSIN HCL 0.4 MG PO CAPS: 0.4000 mg | ORAL_CAPSULE | Freq: Every day | ORAL | Status: DC

## 2014-07-27 MED ORDER — SODIUM CHLORIDE 0.9 % IV BOLUS (SEPSIS)
1000.0000 mL | Freq: Once | INTRAVENOUS | Status: AC
Start: 2014-07-27 — End: 2014-07-27
  Administered 2014-07-27: 1000 mL via INTRAVENOUS

## 2014-07-27 NOTE — ED Provider Notes (Signed)
CSN: [id number]     Arrival date & time 07/27/14  1720 History   First MD Initiated Contact with Patient 07/27/14 1737     Chief Complaint  Patient presents with  . Flank Pain     (Consider location/radiation/quality/duration/timing/severity/associated sxs/prior Treatment) HPI Monique Tyler is a 49 year old female with past medical history of GERD who presents the ER with a 1 hour of abdominal pain. Patient reports lower right-sided abdominal pain onset approximately one hour ago. Patient reports her pain was acute in onset, she states not having any specific activity when her pain started. Patient states her pain as a sharp pain, and is constant. She states her pain is aggravated with movement or walking, and has no alleviating factors. Patient reports what she believes to be associated hematuria, and dysuria with a burning sensation when she urinates. Patient denies associated nausea, vomiting, fever, chest pain, shortness of breath, diarrhea, vaginal discharge, vaginal bleeding.  Past Medical History  Diagnosis Date  . Migraine   . GERD (gastroesophageal reflux disease)   . Lung nodules    Past Surgical History  Procedure Laterality Date  . Cesarean section    . Mandible surgery    . Tubal ligation     No family history on file. History  Substance Use Topics  . Smoking status: Never Smoker   . Smokeless tobacco: Never Used  . Alcohol Use: No   OB History    Gravida Para Term Preterm AB TAB SAB Ectopic Multiple Living   Review of Systems  Constitutional: Negative for fever.  HENT: Negative for trouble swallowing.   Eyes: Negative for visual disturbance.  Respiratory: Negative for shortness of breath.   Cardiovascular: Negative for chest pain.  Gastrointestinal: Positive for abdominal pain. Negative for nausea and vomiting.  Genitourinary: Positive for dysuria and hematuria.  Musculoskeletal: Negative for neck pain.  Skin: Negative for rash.   Neurological: Negative for dizziness, weakness and numbness.  Psychiatric/Behavioral: Negative.       Allergies  Shellfish allergy and Pork-derived products  Home Medications   Prior to Admission medications   Medication Sig Start Date End Date Taking? Authorizing Provider  ciprofloxacin (CIPRO) 250 MG/5ML (5%) SUSR Take by mouth.   Yes Historical Provider, MD  ibuprofen (ADVIL,MOTRIN) 800 MG tablet Take 1 tablet (800 mg total) by mouth every 8 (eight) hours as needed for mild pain. 10/29/13   Kristen N Ward, DO  ondansetron (ZOFRAN) 4 MG tablet Take 1 tablet (4 mg total) by mouth every 8 (eight) hours as needed for nausea or vomiting. 07/27/14   Monte Fantasia, PA-C  oxyCODONE-acetaminophen (PERCOCET) 5-325 MG per tablet Take 1-2 tablets by mouth every 6 (six) hours as needed. 07/27/14   Monte Fantasia, PA-C  tamsulosin (FLOMAX) 0.4 MG CAPS capsule Take 1 capsule (0.4 mg total) by mouth daily. 07/27/14   Monte Fantasia, PA-C  topiramate (TOPAMAX) 50 MG tablet Take 50 mg by mouth 2 (two) times daily.    Historical Provider, MD   BP 112/64 mmHg  Pulse 64  Temp(Src) 98.9 F (37.2 C) (Oral)  Resp 20  Wt 245 lb (111.131 kg)  SpO2 100% Physical Exam  Constitutional: She is oriented to person, place, and time. She appears well-developed and well-nourished. She appears distressed.  Overweight female in Mild to moderate amount of distress due to pain.  HENT:  Head: Normocephalic and atraumatic.  Mouth/Throat: Oropharynx is clear  and moist. No oropharyngeal exudate.  Eyes: Right eye exhibits no discharge. Left eye exhibits no discharge. No scleral icterus.  Neck: Normal range of motion.  Cardiovascular: Normal rate, regular rhythm and normal heart sounds.   No murmur heard. Pulmonary/Chest: Effort normal and breath sounds normal. No respiratory distress.  Abdominal: Soft. Normal appearance and bowel sounds are normal. There is tenderness in the right upper quadrant and right lower quadrant.  There is tenderness at McBurney's point. There is no rigidity, no rebound, no guarding and negative Murphy's sign.  Genitourinary: Pelvic exam was performed with patient prone. There is no rash, tenderness, lesion or injury on the right labia. There is no rash, tenderness, lesion or injury on the left labia. Cervix exhibits no motion tenderness, no discharge and no friability. Right adnexum displays no mass, no tenderness and no fullness. Left adnexum displays no mass, no tenderness and no fullness. No erythema, tenderness or bleeding in the vagina. No foreign body around the vagina. No signs of injury around the vagina. No vaginal discharge found.  Chaperone present during entire pelvic exam. Mild white colored vaginal discharge noted in vaginal vault. No vaginal blood. No cervical friability, discharge or motion tenderness. No adnexal tenderness.  Musculoskeletal: Normal range of motion. She exhibits no edema or tenderness.  Neurological: She is alert and oriented to person, place, and time. She has normal strength. No cranial nerve deficit or sensory deficit. Coordination normal. GCS eye subscore is 4. GCS verbal subscore is 5. GCS motor subscore is 6.  Patient fully alert answer questions appropriately in full, clear sentences. Motor strength 5 out of 5 in all major muscle groups of upper and lower extremity. Cranial nerves II through XII grossly intact. Distal sensation intact.  Skin: Skin is warm and dry. No rash noted. She is not diaphoretic.  Psychiatric: She has a normal mood and affect.  Nursing note and vitals reviewed.   ED Course  Procedures (including critical care time) Labs Review Labs Reviewed  WET PREP, GENITAL - Abnormal; Notable for the following:    Clue Cells Wet Prep HPF POC FEW (*)    All other components within normal limits  COMPREHENSIVE METABOLIC PANEL - Abnormal; Notable for the following:    Alkaline Phosphatase 120 (*)    GFR calc non Af Amer 74 (*)    GFR calc Af  Amer 86 (*)    All other components within normal limits  URINALYSIS, ROUTINE W REFLEX MICROSCOPIC - Abnormal; Notable for the following:    Hgb urine dipstick LARGE (*)    Leukocytes, UA TRACE (*)    All other components within normal limits  GC/CHLAMYDIA PROBE AMP  CBC WITH DIFFERENTIAL  LIPASE, BLOOD  PREGNANCY, URINE  URINE MICROSCOPIC-ADD ON    Imaging Review Ct Abdomen Pelvis W Contrast  07/27/2014   CLINICAL DATA:  Right flank pain and hematuria. History of nephrolithiasis. Previous C-section and tubal ligation.  EXAM: CT ABDOMEN AND PELVIS WITH CONTRAST  TECHNIQUE: Multidetector CT imaging of the abdomen and pelvis was performed using the standard protocol following bolus administration of intravenous contrast.  CONTRAST:  25mL OMNIPAQUE IOHEXOL 300 MG/ML SOLN, OMNIPAQUE IOHEXOL 300 MG/ML SOLN  COMPARISON:  Abdomen and pelvis CT dated 12/09/2007 and abdomen ultrasound dated 05/03/2011.  FINDINGS: There is a 1 mm calculus in the posterior aspect of the urinary bladder on the right, at the ureterovesical junction. Mild dilatation of the right renal collecting system and ureter to the level of the ureterovesical junction.  There is also mild enlargement of the right kidney with a mild amount of perinephric fluid. There is a tiny lower pole calculus on the left. No hydronephrosis on the left. There is a small amount of medium density material in the dependent aspect of the urinary bladder, greater on the right.  Multiple bilateral calcified pelvic phleboliths inferiorly. Normal appearing liver, spleen, pancreas, gallbladder, adrenal glands, uterus and ovaries. No gastrointestinal abnormalities or enlarged lymph nodes. Normal appearing appendix. Clear lung bases. Unremarkable bones.  IMPRESSION: 1. 1 mm calculus at the right ureterovesical junction, causing mild right hydronephrosis and hydroureter. 2. Tiny, nonobstructing lower pole left renal calculus. 3. Mild amount of probable blood in the  urinary bladder.   Electronically Signed   By: Gordan Payment M.D.   On: 07/27/2014 22:17     EKG Interpretation None      MDM   Final diagnoses:  RLQ abdominal pain  Renal stone    Pt has been diagnosed with a Kidney Stone via CT. There is no evidence of significant hydronephrosis, serum creatine WNL, vitals sign stable and the pt does not have irratractable vomiting. Pt will be dc home with pain medications & has been advised to follow up with urology. I discussed return precautions with patient and patient verbalized agreement and understanding of this plan. I encouraged patient to call or return to the ER should she have any questions or concerns.  BP 112/64 mmHg  Pulse 64  Temp(Src) 98.9 F (37.2 C) (Oral)  Resp 20  Wt 245 lb (111.131 kg)  SpO2 100%  Signed,  Ladona Mow, PA-C 2:26 AM     Monte Fantasia, PA-C 07/28/14 1610  Mirian Mo, MD 07/28/14 [id number]

## 2014-07-27 NOTE — Discharge Instructions (Signed)
Follow-up with urology. Return to the ER if any high fever, severe abdominal pain, nausea, vomiting, difficulty urinating.  Kidney Stones Kidney stones (urolithiasis) are deposits that form inside your kidneys. The intense pain is caused by the stone moving through the urinary tract. When the stone moves, the ureter goes into spasm around the stone. The stone is usually passed in the urine.  CAUSES   A disorder that makes certain neck glands produce too much parathyroid hormone (primary hyperparathyroidism).  A buildup of uric acid crystals, similar to gout in your joints.  Narrowing (stricture) of the ureter.  A kidney obstruction present at birth (congenital obstruction).  Previous surgery on the kidney or ureters.  Numerous kidney infections. SYMPTOMS   Feeling sick to your stomach (nauseous).  Throwing up (vomiting).  Blood in the urine (hematuria).  Pain that usually spreads (radiates) to the groin.  Frequency or urgency of urination. DIAGNOSIS   Taking a history and physical exam.  Blood or urine tests.  CT scan.  Occasionally, an examination of the inside of the urinary bladder (cystoscopy) is performed. TREATMENT   Observation.  Increasing your fluid intake.  Extracorporeal shock wave lithotripsy--This is a noninvasive procedure that uses shock waves to break up kidney stones.  Surgery may be needed if you have severe pain or persistent obstruction. There are various surgical procedures. Most of the procedures are performed with the use of small instruments. Only small incisions are needed to accommodate these instruments, so recovery time is minimized. The size, location, and chemical composition are all important variables that will determine the proper choice of action for you. Talk to your health care provider to better understand your situation so that you will minimize the risk of injury to yourself and your kidney.  HOME CARE INSTRUCTIONS   Drink enough  water and fluids to keep your urine clear or pale yellow. This will help you to pass the stone or stone fragments.  Strain all urine through the provided strainer. Keep all particulate matter and stones for your health care provider to see. The stone causing the pain may be as small as a grain of salt. It is very important to use the strainer each and every time you pass your urine. The collection of your stone will allow your health care provider to analyze it and verify that a stone has actually passed. The stone analysis will often identify what you can do to reduce the incidence of recurrences.  Only take over-the-counter or prescription medicines for pain, discomfort, or fever as directed by your health care provider.  Make a follow-up appointment with your health care provider as directed.  Get follow-up X-rays if required. The absence of pain does not always mean that the stone has passed. It may have only stopped moving. If the urine remains completely obstructed, it can cause loss of kidney function or even complete destruction of the kidney. It is your responsibility to make sure X-rays and follow-ups are completed. Ultrasounds of the kidney can show blockages and the status of the kidney. Ultrasounds are not associated with any radiation and can be performed easily in a matter of minutes. SEEK MEDICAL CARE IF:  You experience pain that is progressive and unresponsive to any pain medicine you have been prescribed. SEEK IMMEDIATE MEDICAL CARE IF:   Pain cannot be controlled with the prescribed medicine.  You have a fever or shaking chills.  The severity or intensity of pain increases over 18 hours and is not relieved  by pain medicine.  You develop a new onset of abdominal pain.  You feel faint or pass out.  You are unable to urinate. MAKE SURE YOU:   Understand these instructions.  Will watch your condition.  Will get help right away if you are not doing well or get  worse. Document Released: 07/10/2005 Document Revised: 03/12/2013 Document Reviewed: 12/11/2012 [facility] Patient Information 2015 [place], [place]. This information is not intended to replace advice given to you by your health care provider. Make sure you discuss any questions you have with your health care provider.

## 2014-07-27 NOTE — ED Notes (Signed)
PA at bedside.

## 2014-07-29 LAB — GC/CHLAMYDIA PROBE AMP
CT Probe RNA: NEGATIVE
GC Probe RNA: NEGATIVE

## 2014-08-04 ENCOUNTER — Emergency Department (HOSPITAL_COMMUNITY)
Admission: EM | Admit: 2014-08-04 | Discharge: 2014-08-05 | Disposition: A | Discharge status: Home / Self Care | Payer: Managed Care, Other (non HMO) | Attending: Emergency Medicine | Admitting: Emergency Medicine

## 2014-08-04 ENCOUNTER — Emergency Department (HOSPITAL_COMMUNITY): Payer: Managed Care, Other (non HMO)

## 2014-08-04 ENCOUNTER — Encounter (HOSPITAL_COMMUNITY): Payer: Self-pay | Admitting: *Deleted

## 2014-08-04 DIAGNOSIS — Z9889 Other specified postprocedural states: Secondary | ICD-10-CM | POA: Insufficient documentation

## 2014-08-04 DIAGNOSIS — R109 Unspecified abdominal pain: Secondary | ICD-10-CM | POA: Diagnosis present

## 2014-08-04 DIAGNOSIS — N200 Calculus of kidney: Secondary | ICD-10-CM | POA: Diagnosis not present

## 2014-08-04 DIAGNOSIS — G43909 Migraine, unspecified, not intractable, without status migrainosus: Secondary | ICD-10-CM | POA: Diagnosis not present

## 2014-08-04 DIAGNOSIS — Z9851 Tubal ligation status: Secondary | ICD-10-CM | POA: Diagnosis not present

## 2014-08-04 DIAGNOSIS — Z8719 Personal history of other diseases of the digestive system: Secondary | ICD-10-CM | POA: Diagnosis not present

## 2014-08-04 DIAGNOSIS — Z79899 Other long term (current) drug therapy: Secondary | ICD-10-CM | POA: Insufficient documentation

## 2014-08-04 LAB — URINALYSIS, ROUTINE W REFLEX MICROSCOPIC
Bilirubin Urine: NEGATIVE
Glucose, UA: NEGATIVE mg/dL
Hgb urine dipstick: NEGATIVE
Ketones, ur: NEGATIVE mg/dL
LEUKOCYTES UA: NEGATIVE
NITRITE: NEGATIVE
PH: 7.5 (ref 5.0–8.0)
PROTEIN: NEGATIVE mg/dL
Specific Gravity, Urine: 1.006 (ref 1.005–1.030)
UROBILINOGEN UA: 0.2 mg/dL (ref 0.0–1.0)

## 2014-08-04 LAB — CBC WITH DIFFERENTIAL/PLATELET
BASOS PCT: 0 % (ref 0–1)
Basophils Absolute: 0 10*3/uL (ref 0.0–0.1)
EOS ABS: 0.1 10*3/uL (ref 0.0–0.7)
Eosinophils Relative: 1 % (ref 0–5)
HCT: 40.4 % (ref 36.0–46.0)
Hemoglobin: 12.8 g/dL (ref 12.0–15.0)
Lymphocytes Relative: 38 % (ref 12–46)
Lymphs Abs: 2.9 10*3/uL (ref 0.7–4.0)
MCH: 27.2 pg (ref 26.0–34.0)
MCHC: 31.7 g/dL (ref 30.0–36.0)
MCV: 86 fL (ref 78.0–100.0)
Monocytes Absolute: 0.7 10*3/uL (ref 0.1–1.0)
Monocytes Relative: 9 % (ref 3–12)
Neutro Abs: 4 10*3/uL (ref 1.7–7.7)
Neutrophils Relative %: 52 % (ref 43–77)
PLATELETS: 228 10*3/uL (ref 150–400)
RBC: 4.7 MIL/uL (ref 3.87–5.11)
RDW: 13.9 % (ref 11.5–15.5)
WBC: 7.6 10*3/uL (ref 4.0–10.5)

## 2014-08-04 LAB — BASIC METABOLIC PANEL
ANION GAP: 4 — AB (ref 5–15)
BUN: 12 mg/dL (ref 6–23)
CALCIUM: 9.1 mg/dL (ref 8.4–10.5)
CO2: 22 mmol/L (ref 19–32)
Chloride: 110 mEq/L (ref 96–112)
Creatinine, Ser: 0.86 mg/dL (ref 0.50–1.10)
GFR, EST AFRICAN AMERICAN: 90 mL/min — AB (ref >90)
GFR, EST NON AFRICAN AMERICAN: 77 mL/min — AB (ref >90)
Glucose, Bld: 95 mg/dL (ref 70–99)
POTASSIUM: 4 mmol/L (ref 3.5–5.1)
SODIUM: 136 mmol/L (ref 135–145)

## 2014-08-04 MED ORDER — KETOROLAC TROMETHAMINE 30 MG/ML IJ SOLN
30.0000 mg | Freq: Once | INTRAMUSCULAR | Status: AC
  Administered 2014-08-04: 30 mg via INTRAVENOUS
  Filled 2014-08-04: qty 1

## 2014-08-04 MED ORDER — HYDROMORPHONE HCL 1 MG/ML IJ SOLN
0.5000 mg | Freq: Once | INTRAMUSCULAR | Status: AC
Start: 2014-08-04 — End: 2014-08-04
  Administered 2014-08-04: 0.5 mg via INTRAVENOUS
  Filled 2014-08-04: qty 1

## 2014-08-04 MED ORDER — HYDROMORPHONE HCL 1 MG/ML IJ SOLN
1.0000 mg | Freq: Once | INTRAMUSCULAR | Status: AC
  Administered 2014-08-04: 1 mg via INTRAVENOUS
  Filled 2014-08-04: qty 1

## 2014-08-04 NOTE — ED Provider Notes (Signed)
CSN: [id number]     Arrival date & time 08/04/14  2036 History   First MD Initiated Contact with Patient 08/04/14 2126     Chief Complaint  Patient presents with  . Flank Pain     (Consider location/radiation/quality/duration/timing/severity/associated sxs/prior Treatment) HPI Okey DupreBeth Manzer is a 49 y.o. female with history of migraines, GERD, presents to emergency department complaining of right flank pain. Patient states her symptoms began 8 days ago. [facility] began, was diagnosed with a kidney stone there. She was discharged home with Percocet and Flomax. She states she is taking this with no improvement in her pain. She states she called urologist and she was told that the next available appointment is in 10 days from today. She states she is unable to tolerate pain at home, and ran out of the pain medications. She describes pain as sharp, right flank radiating to the lower right abdomen. No nausea or vomiting. She reports dysuria. She reports subjective fever and chills. She has not tried taking anything else for her symptoms. No other complaints. Normal bowel movements daily.  Past Medical History  Diagnosis Date  . Migraine   . GERD (gastroesophageal reflux disease)   . Lung nodules    Past Surgical History  Procedure Laterality Date  . Cesarean section    . Mandible surgery    . Tubal ligation     No family history on file. History  Substance Use Topics  . Smoking status: Never Smoker   . Smokeless tobacco: Never Used  . Alcohol Use: No   OB History    Gravida Para Term Preterm AB TAB SAB Ectopic Multiple Living   5 5       1       Review of Systems  Constitutional: Positive for fever and chills.  Respiratory: Negative for cough, chest tightness and shortness of breath.   Cardiovascular: Negative for chest pain, palpitations and leg swelling.  Gastrointestinal: Positive for abdominal pain. Negative for  nausea, vomiting and diarrhea.  Genitourinary: Positive for dysuria and flank pain. Negative for pelvic pain.  Musculoskeletal: Negative for myalgias, arthralgias, neck pain and neck stiffness.  Skin: Negative for rash.  Neurological: Negative for dizziness, weakness and headaches.  All other systems reviewed and are negative.     Allergies  Shellfish allergy and Pork-derived products  Home Medications   Prior to Admission medications   Medication Sig Start Date End Date Taking? Authorizing Provider  ibuprofen (ADVIL,MOTRIN) 800 MG tablet Take 1 tablet (800 mg total) by mouth every 8 (eight) hours as needed for mild pain. 10/29/13  Yes Kristen N Ward, DO  ondansetron (ZOFRAN) 4 MG tablet Take 1 tablet (4 mg total) by mouth every 8 (eight) hours as needed for nausea or vomiting. 07/27/14  Yes Monte FantasiaJoseph W Mintz, PA-C  oxyCODONE-acetaminophen (PERCOCET) 5-325 MG per tablet Take 1-2 tablets by mouth every 6 (six) hours as needed. Patient taking differently: Take 1-2 tablets by mouth every 6 (six) hours as needed for moderate pain.  07/27/14  Yes Monte FantasiaJoseph W Mintz, PA-C  tamsulosin (FLOMAX) 0.4 MG CAPS capsule Take 1 capsule (0.4 mg total) by mouth daily. 07/27/14  Yes Monte FantasiaJoseph W Mintz, PA-C  topiramate (TOPAMAX) 50 MG tablet Take 50 mg by mouth 2 (two) times daily.   Yes Historical Provider, MD   BP 152/94 mmHg  Pulse 79  Temp(Src) 98.2 F (36.8 C) (Oral)  Resp 20  SpO2 100%  Physical Exam  Constitutional: She is oriented to person, place, and time. She appears well-developed and well-nourished. No distress.  HENT:  Head: Normocephalic.  Eyes: Conjunctivae are normal.  Neck: Neck supple.  Cardiovascular: Normal rate, regular rhythm and normal heart sounds.   Pulmonary/Chest: Effort normal and breath sounds normal. No respiratory distress. She has no wheezes. She has no rales.  Abdominal: Soft. Bowel sounds are normal. She exhibits no distension. There is tenderness. There is no rebound.  Right CVA  tenderness  Musculoskeletal: She exhibits no edema.  Neurological: She is alert and oriented to person, place, and time.  Skin: Skin is warm and dry.  Psychiatric: She has a normal mood and affect. Her behavior is normal.  Nursing note and vitals reviewed.   ED Course  Procedures (including critical care time) Labs Review Labs Reviewed  URINALYSIS, ROUTINE W REFLEX MICROSCOPIC - Abnormal; Notable for the following:    APPearance HAZY (*)    All other components within normal limits  BASIC METABOLIC PANEL - Abnormal; Notable for the following:    GFR calc non Af Amer 77 (*)    GFR calc Af Amer 90 (*)    Anion gap 4 (*)    All other components within normal limits  CBC WITH DIFFERENTIAL    Imaging Review US Renal  08/04/2014   CLINICAL DATA:  Acute onset of right flank pain.  Initial encounter.  EXAM: RENAL/URINARY TRACT ULTRASOUND COMPLETE  COMPARISON:  CT of the abdomen and pelvis performed 07/27/2014  FINDINGS: Right Kidney:  Length: 11.9 cm. Echogenicity within normal limits. No mass seen. Previously noted right-sided hydronephrosis has resolved.  Left Kidney:  Length: 9.6 cm. Echogenicity within normal limits. There appears to be an 8 mm calcification at the lower pole of the left kidney, not well seen on the recent prior CT. No mass or hydronephrosis visualized.  Bladder:  Appears normal for degree of bladder distention. Bilateral ureteral jets are visualized.  IMPRESSION: 1. No evidence of hydronephrosis. Previously noted right-sided hydronephrosis has resolved. Bilateral ureteral jets are now seen. 2. Apparent 8 mm nonobstructing stone at the lower pole of the left kidney.   Electronically Signed   By: Roanna Raider M.D.   On: 08/04/2014 22:46     EKG Interpretation None      MDM   Final diagnoses:  None    Pt with known 1mm calculus at right UVJ 1 week ago. Continues to have pain and dysuria. Will order pain medications, ua, labs.   11:15 PM No improvement with 0.5mg   dilaudid IV, toradol  IV. US obtained to evaluate for hydronephrosis. Will try more dilaudid. Pain still 9/10   12:10 AM Patient is feeling much better. Pain is minimal. She was to be discharged home. Will switch to Vicodin, given Percocet is causing her to have a migraine. Instructed to follow-up with urology. Return precautions discussed. At this time for signs of infection: Patient nontoxic, vital signs are normal. Stable for discharge home.  Filed Vitals:   08/04/14 2105 08/04/14 2235 08/04/14 2340  BP: 152/94 120/77 109/59  Pulse: 79 60 60  Temp: 98.2 F (36.8 C)    TempSrc: Oral    Resp: SpO2: 100% 97% 95%     Lottie Mussel, PA-C 08/05/14 0016  Candyce Churn III, MD 08/05/14 [phone]

## 2014-08-04 NOTE — ED Notes (Signed)
Pt reports being seen at [facility] ED and for R flank pain and was dx with kidney stone.  Was sent home with rxs to pass stone, but hasn't.  Pt reports pain is not tolerable and ran out of pain meds.  Pt reports nausea with the pain.

## 2014-08-05 MED ORDER — HYDROCODONE-ACETAMINOPHEN 10-300 MG PO TABS: 1.0000 | ORAL_TABLET | Freq: Four times a day (QID) | ORAL | Status: DC | PRN

## 2014-08-05 MED ORDER — TAMSULOSIN HCL 0.4 MG PO CAPS: 0.4000 mg | ORAL_CAPSULE | Freq: Every day | ORAL | Status: DC

## 2014-08-05 NOTE — Discharge Instructions (Signed)
Continue Flomax daily. Ibuprofen for pain, 600 mg every 6 hours. Take Vicodin as prescribed as needed for severe pain. Do not take together with Percocet. Follow-up with urologist and soon as able. Return if fever, worsening pain, vomiting, any new concerning symptoms.  Kidney Stones Kidney stones (urolithiasis) are deposits that form inside your kidneys. The intense pain is caused by the stone moving through the urinary tract. When the stone moves, the ureter goes into spasm around the stone. The stone is usually passed in the urine.  CAUSES   A disorder that makes certain neck glands produce too much parathyroid hormone (primary hyperparathyroidism).  A buildup of uric acid crystals, similar to gout in your joints.  Narrowing (stricture) of the ureter.  A kidney obstruction present at birth (congenital obstruction).  Previous surgery on the kidney or ureters.  Numerous kidney infections. SYMPTOMS   Feeling sick to your stomach (nauseous).  Throwing up (vomiting).  Blood in the urine (hematuria).  Pain that usually spreads (radiates) to the groin.  Frequency or urgency of urination. DIAGNOSIS   Taking a history and physical exam.  Blood or urine tests.  CT scan.  Occasionally, an examination of the inside of the urinary bladder (cystoscopy) is performed. TREATMENT   Observation.  Increasing your fluid intake.  Extracorporeal shock wave lithotripsy--This is a noninvasive procedure that uses shock waves to break up kidney stones.  Surgery may be needed if you have severe pain or persistent obstruction. There are various surgical procedures. Most of the procedures are performed with the use of small instruments. Only small incisions are needed to accommodate these instruments, so recovery time is minimized. The size, location, and chemical composition are all important variables that will determine the proper choice of action for you. Talk to your health care provider to  better understand your situation so that you will minimize the risk of injury to yourself and your kidney.  HOME CARE INSTRUCTIONS   Drink enough water and fluids to keep your urine clear or pale yellow. This will help you to pass the stone or stone fragments.  Strain all urine through the provided strainer. Keep all particulate matter and stones for your health care provider to see. The stone causing the pain may be as small as a grain of salt. It is very important to use the strainer each and every time you pass your urine. The collection of your stone will allow your health care provider to analyze it and verify that a stone has actually passed. The stone analysis will often identify what you can do to reduce the incidence of recurrences.  Only take over-the-counter or prescription medicines for pain, discomfort, or fever as directed by your health care provider.  Make a follow-up appointment with your health care provider as directed.  Get follow-up X-rays if required. The absence of pain does not always mean that the stone has passed. It may have only stopped moving. If the urine remains completely obstructed, it can cause loss of kidney function or even complete destruction of the kidney. It is your responsibility to make sure X-rays and follow-ups are completed. Ultrasounds of the kidney can show blockages and the status of the kidney. Ultrasounds are not associated with any radiation and can be performed easily in a matter of minutes. SEEK MEDICAL CARE IF:  You experience pain that is progressive and unresponsive to any pain medicine you have been prescribed. SEEK IMMEDIATE MEDICAL CARE IF:   Pain cannot be controlled with the  prescribed medicine.  You have a fever or shaking chills.  The severity or intensity of pain increases over 18 hours and is not relieved by pain medicine.  You develop a new onset of abdominal pain.  You feel faint or pass out.  You are unable to  urinate. MAKE SURE YOU:   Understand these instructions.  Will watch your condition.  Will get help right away if you are not doing well or get worse. Document Released: 07/10/2005 Document Revised: 03/12/2013 Document Reviewed: 12/11/2012 [facility] Patient Information 2015 [place], [place]. This information is not intended to replace advice given to you by your health care provider. Make sure you discuss any questions you have with your health care provider.

## 2014-09-19 ENCOUNTER — Encounter (HOSPITAL_BASED_OUTPATIENT_CLINIC_OR_DEPARTMENT_OTHER): Payer: Self-pay | Admitting: *Deleted

## 2014-09-19 ENCOUNTER — Emergency Department (HOSPITAL_BASED_OUTPATIENT_CLINIC_OR_DEPARTMENT_OTHER): Payer: Managed Care, Other (non HMO)

## 2014-09-19 ENCOUNTER — Emergency Department (HOSPITAL_BASED_OUTPATIENT_CLINIC_OR_DEPARTMENT_OTHER)
Admission: EM | Admit: 2014-09-19 | Discharge: 2014-09-19 | Disposition: A | Discharge status: Home / Self Care | Payer: Managed Care, Other (non HMO) | Attending: Emergency Medicine | Admitting: Emergency Medicine

## 2014-09-19 DIAGNOSIS — S8991XA Unspecified injury of right lower leg, initial encounter: Secondary | ICD-10-CM | POA: Insufficient documentation

## 2014-09-19 DIAGNOSIS — Z8719 Personal history of other diseases of the digestive system: Secondary | ICD-10-CM | POA: Diagnosis not present

## 2014-09-19 DIAGNOSIS — S99912A Unspecified injury of left ankle, initial encounter: Secondary | ICD-10-CM | POA: Insufficient documentation

## 2014-09-19 DIAGNOSIS — G43909 Migraine, unspecified, not intractable, without status migrainosus: Secondary | ICD-10-CM | POA: Insufficient documentation

## 2014-09-19 DIAGNOSIS — S6991XA Unspecified injury of right wrist, hand and finger(s), initial encounter: Secondary | ICD-10-CM | POA: Diagnosis not present

## 2014-09-19 DIAGNOSIS — Z79899 Other long term (current) drug therapy: Secondary | ICD-10-CM | POA: Diagnosis not present

## 2014-09-19 DIAGNOSIS — W102XXA Fall (on)(from) incline, initial encounter: Secondary | ICD-10-CM | POA: Insufficient documentation

## 2014-09-19 DIAGNOSIS — Z87442 Personal history of urinary calculi: Secondary | ICD-10-CM | POA: Insufficient documentation

## 2014-09-19 DIAGNOSIS — Y9289 Other specified places as the place of occurrence of the external cause: Secondary | ICD-10-CM | POA: Insufficient documentation

## 2014-09-19 DIAGNOSIS — Y998 Other external cause status: Secondary | ICD-10-CM | POA: Diagnosis not present

## 2014-09-19 DIAGNOSIS — Y9389 Activity, other specified: Secondary | ICD-10-CM | POA: Diagnosis not present

## 2014-09-19 HISTORY — DX: Calculus of kidney: N20.0

## 2014-09-19 MED ORDER — IBUPROFEN 800 MG PO TABS: 800.0000 mg | ORAL_TABLET | Freq: Three times a day (TID) | ORAL | Status: DC

## 2014-09-19 MED ORDER — OXYCODONE-ACETAMINOPHEN 5-325 MG PO TABS: 1.0000 | ORAL_TABLET | Freq: Four times a day (QID) | ORAL | Status: DC | PRN

## 2014-09-19 MED ORDER — OXYCODONE-ACETAMINOPHEN 5-325 MG PO TABS
2.0000 | ORAL_TABLET | Freq: Once | ORAL | Status: AC
  Administered 2014-09-19: 2 via ORAL
  Filled 2014-09-19: qty 2

## 2014-09-19 NOTE — Discharge Instructions (Signed)
Take motrin for pain.   Take percocet for severe pain.   Use splint for comfort and crutches.   You may bear weight as needed   Follow up with orthopedic doctor  Return to ER if you have severe pain, vomiting, headaches.

## 2014-09-19 NOTE — ED Provider Notes (Signed)
CSN: [id number]     Arrival date & time 09/19/14  1901 History  This chart was scribed for Richardean Canalavid H Timmie Dugue, MD by Modena JanskyAlbert Thayil, ED Scribe. This patient was seen in room [facility] and the patient's care was started at 9:20 PM.     Chief Complaint  Patient presents with  . Fall   The history is provided by the patient. No language interpreter was used.   HPI Comments: Monique Tyler is a 49 y.o. female who presents to the Emergency Department complaining of a fall that occurred today. She reports that she missed a step, twisted, and fell on her left knee. She denies any head injury or LOC. She reports that she also injured her back and hand. She states that she is able to ambulate. She denies any injuries to the abdomen.   Past Medical History  Diagnosis Date  . Migraine   . GERD (gastroesophageal reflux disease)   . Lung nodules   . Kidney stones    Past Surgical History  Procedure Laterality Date  . Cesarean section    . Mandible surgery    . Tubal ligation     No family history on file. History  Substance Use Topics  . Smoking status: Never Smoker   . Smokeless tobacco: Never Used  . Alcohol Use: No   OB History    Gravida Para Term Preterm AB TAB SAB Ectopic Multiple Living   5 5       1       Review of Systems  All other systems reviewed and are negative.   Allergies  Shellfish allergy and Pork-derived products  Home Medications   Prior to Admission medications   Medication Sig Start Date End Date Taking? Authorizing Provider  Hydrocodone-Acetaminophen (VICODIN HP) 10-300 MG TABS Take 1 tablet by mouth every 6 (six) hours as needed. 08/05/14   Tatyana A Kirichenko, PA-C  ibuprofen (ADVIL,MOTRIN) 800 MG tablet Take 1 tablet (800 mg total) by mouth every 8 (eight) hours as needed for mild pain. 10/29/13   Kristen N Ward, DO  ondansetron (ZOFRAN) 4 MG tablet Take 1 tablet (4 mg total) by mouth every 8 (eight) hours as needed for nausea or vomiting. 07/27/14   Monte FantasiaJoseph W Mintz,  PA-C  oxyCODONE-acetaminophen (PERCOCET) 5-325 MG per tablet Take 1-2 tablets by mouth every 6 (six) hours as needed. Patient taking differently: Take 1-2 tablets by mouth every 6 (six) hours as needed for moderate pain.  07/27/14   Monte FantasiaJoseph W Mintz, PA-C  tamsulosin (FLOMAX) 0.4 MG CAPS capsule Take 1 capsule (0.4 mg total) by mouth daily. 08/05/14   Tatyana A Kirichenko, PA-C  topiramate (TOPAMAX) 50 MG tablet Take 50 mg by mouth 2 (two) times daily.    Historical Provider, MD   BP 138/71 mmHg  Pulse 94  Temp(Src) 98.7 F (37.1 C) (Oral)  Resp 18  Ht 5\' 7"  (1.702 m)  Wt 250 lb (113.399 kg)  BMI 39.15 kg/m2  SpO2 100% Physical Exam  Constitutional: She is oriented to person, place, and time. She appears well-developed and well-nourished. No distress.  HENT:  Head: Normocephalic and atraumatic.  Neck: Neck supple. No tracheal deviation present.  Cardiovascular: Normal rate.   Pulmonary/Chest: Effort normal. No respiratory distress.  Musculoskeletal: Normal range of motion. She exhibits tenderness.  Right palm and right wrist has mild TTP.  No obvious deformity. No obvious bony tenderness Right knee is TTP. PCL intact. TTP along left Tib Fib. Swelling on the left lateral  malleolus. +2 pulse of the left ankle.   Neurological: She is alert and oriented to person, place, and time.  Skin: Skin is warm and dry.  Psychiatric: She has a normal mood and affect. Her behavior is normal.  Nursing note and vitals reviewed.   ED Course  Procedures (including critical care time) DIAGNOSTIC STUDIES: Oxygen Saturation is 100% on RA, normal by my interpretation.    COORDINATION OF CARE: 9:24 PM- Pt advised of plan for treatment and pt agrees.  Labs Review Labs Reviewed - No data to display  Imaging Review Dg Tibia/fibula Left  09/19/2014   CLINICAL DATA:  Fall, left lower extremity pain  EXAM: LEFT TIBIA AND FIBULA - 2 VIEW  COMPARISON:  None.  FINDINGS: No fracture or dislocation is seen.  The  joint spaces are preserved.  The visualized soft tissues are unremarkable.  IMPRESSION: No fracture or dislocation is seen.   Electronically Signed   By: Charline Bills M.D.   On: 09/19/2014 22:03   Dg Ankle Complete Left  09/19/2014   CLINICAL DATA:  Patient fell on steps, twisted left ankle in landed on right knee. Soreness in the left ankle and right knee.  EXAM: LEFT ANKLE COMPLETE - 3+ VIEW  COMPARISON:  None.  FINDINGS: Soft tissue swelling greatest laterally. No evidence of acute fracture or subluxation. No focal bone lesion or bone destruction. Bone cortex and trabecular architecture appear intact. No radiopaque soft tissue foreign bodies.  IMPRESSION: Soft tissue swelling. No acute bony abnormalities in the left ankle.   Electronically Signed   By: Burman Nieves M.D.   On: 09/19/2014 21:36   Dg Knee Complete 4 Views Right  09/19/2014   CLINICAL DATA:  Fall on right knee  EXAM: RIGHT KNEE - COMPLETE 4+ VIEW  COMPARISON:  None.  FINDINGS: No fracture or dislocation is seen.  The joint spaces are preserved.  The visualized soft tissues are unremarkable.  No suprapatellar knee joint effusion.  IMPRESSION: No fracture or dislocation is seen.   Electronically Signed   By: Charline Bills M.D.   On: 09/19/2014 21:36   Dg Foot Complete Left  09/19/2014   CLINICAL DATA:  Fall  EXAM: LEFT FOOT - COMPLETE 3+ VIEW  COMPARISON:  None.  FINDINGS: No fracture or dislocation is seen.  The joint spaces are preserved.  The visualized soft tissues are unremarkable.  IMPRESSION: No fracture or dislocation is seen.   Electronically Signed   By: Charline Bills M.D.   On: 09/19/2014 22:04     EKG Interpretation None      MDM   Final diagnoses:  None   Monique Tyler is a 49 y.o. female here with s/p fall. Has bilateral wrist pain but nl ROM so I think likely bruised. Has tenderness L tib/fib, R knee, L ankle and foot but xrays unremarkable. Likely contusion. Will give ankle aircast, crutches.   I  personally performed the services described in this documentation, which was scribed in my presence. The recorded information has been reviewed and is accurate.   Richardean Canal, MD 09/19/14 2221

## 2014-09-19 NOTE — ED Notes (Signed)
Reports fell because she didn't see a step- twisted left ankle and landed on right knee- c/o soreness in both wrists

## 2014-09-21 ENCOUNTER — Ambulatory Visit (INDEPENDENT_AMBULATORY_CARE_PROVIDER_SITE_OTHER): Payer: PRIVATE HEALTH INSURANCE | Admitting: Family Medicine

## 2014-09-21 ENCOUNTER — Encounter: Payer: Self-pay | Admitting: Family Medicine

## 2014-09-21 VITALS — BP 148/96 | HR 65 | Ht 67.0 in | Wt 250.0 lb

## 2014-09-21 DIAGNOSIS — S93409A Sprain of unspecified ligament of unspecified ankle, initial encounter: Secondary | ICD-10-CM

## 2014-09-21 DIAGNOSIS — S63509A Unspecified sprain of unspecified wrist, initial encounter: Secondary | ICD-10-CM

## 2014-09-21 DIAGNOSIS — S8001XA Contusion of right knee, initial encounter: Secondary | ICD-10-CM

## 2014-09-21 MED ORDER — OXYCODONE-ACETAMINOPHEN 5-325 MG PO TABS: 1.0000 | ORAL_TABLET | Freq: Four times a day (QID) | ORAL | Status: DC | PRN

## 2014-09-21 NOTE — Patient Instructions (Signed)
You have two bad ankle sprains Ice the areas for 15 minutes at a time, 3-4 times a day Ibuprofen or aleve as needed for pain and inflammation. Percocet as needed for severe pain. Elevate above the level of your heart when possible Crutches if needed to help with walking Bear weight when tolerated Use boot or laceup ankle brace to help with stability while you recover from this injury. Come out of the boot/brace twice a day to do Up/down and alphabet exercises 2-3 sets of each. Consider physical therapy for strengthening and balance exercises in the future. If not improving as expected, we may repeat x-rays or consider further testing like an MRI.  You have a right knee contusion and bilateral wrist sprains as well. Medications as listed above. These should improve over 2-4 weeks though the ankles will likely be 6-8 weeks.

## 2014-09-22 ENCOUNTER — Ambulatory Visit: Payer: Managed Care, Other (non HMO) | Admitting: Family Medicine

## 2014-09-23 NOTE — Assessment & Plan Note (Signed)
No snuffbox or distal radius tenderness.  Do not think she needs radiographs on these areas.  Discussed considering wrist braces.  Icing, nsaids with percocet as needed.

## 2014-09-23 NOTE — Assessment & Plan Note (Signed)
Reassured.  Icing, nsaids, percocet as needed.

## 2014-09-23 NOTE — Assessment & Plan Note (Signed)
radiographs negative of left lower leg including ankle.  No bony tenderness on right.  Shown home exercises to do daily.  Cam walker on left, ASO right.  Icing as needed.  NSAIDs with percocet as needed.  F/u in 2 weeks.

## 2014-09-23 NOTE — Progress Notes (Signed)
PCP: Jackie Plum, MD  Subjective:   HPI: Patient is a 49 y.o. female here for a fall.  Patient reports on 2/27 she accidentally missed a step when walking forward and inverted left ankle badly, fell onto right knee and ankle. Unable to bear weight comfortably - in wheelchair today. Pain is 9/10 in left ankle, 6/10 right ankle. Had radiographs of left ankle, foot, and tib-fib, right knee negative. Taking ibuprofen and percocet. Using crutches though these feel uncomfortable as does the aircast for left ankle. No prior injuries to these areas.  Past Medical History  Diagnosis Date  . Migraine   . GERD (gastroesophageal reflux disease)   . Lung nodules   . Kidney stones     Current Outpatient Prescriptions on File Prior to Visit  Medication Sig Dispense Refill  . ibuprofen (ADVIL,MOTRIN) 800 MG tablet Take 1 tablet (800 mg total) by mouth 3 (three) times daily. 21 tablet 0  . tamsulosin (FLOMAX) 0.4 MG CAPS capsule Take 1 capsule (0.4 mg total) by mouth daily. 10 capsule 0  . topiramate (TOPAMAX) 50 MG tablet Take 50 mg by mouth 2 (two) times daily.     No current facility-administered medications on file prior to visit.    Past Surgical History  Procedure Laterality Date  . Cesarean section    . Mandible surgery    . Tubal ligation      Allergies  Allergen Reactions  . Shellfish Allergy Swelling  . Pork-Derived Products Nausea And Vomiting    History   Social History  . Marital Status: Married    Spouse Name: N/A  . Number of Children: N/A  . Years of Education: N/A   Occupational History  . Not on file.   Social History Main Topics  . Smoking status: Never Smoker   . Smokeless tobacco: Never Used  . Alcohol Use: No  . Drug Use: No  . Sexual Activity: Yes    Birth Control/ Protection: Surgical   Other Topics Concern  . Not on file   Social History Narrative    No family history on file.  BP 148/96 mmHg  Pulse 65  Ht  (1.702 m)  Wt  250 lb (113.399 kg)  BMI 39.15 kg/m2  Review of Systems: See HPI above.    Objective:  Physical Exam:  Gen: NAD  Left ankle: Mild lateral ankle swelling.  No bruising, other deformity. Very little motion but able to do so all directions. TTP greatest over ATFL, less lat malleolus. 2+ ant drawer and talar tilt, painful. Negative syndesmotic compression. Thompsons test negative. NV intact distally.  Right ankle: No gross deformity, swelling, ecchymoses FROM TTP over ATFL only. 1+ ant drawer and talar tilt.   Negative syndesmotic compression. Thompsons test negative. NV intact distally.  Bilateral wrists: No gross deformity, swelling, bruising. TTP dorsal wrist joints but none over snuffbox, distal radius, other areas of wrists. FROM but painful with extension and flexion. NVI distally.  Right knee: Healing abrasion anterior knee.  No other gross deformity, ecchymoses, effusion. Diffuse anterior tenderness. FROM. Negative ant/post drawers. Negative valgus/varus testing. Negative lachmanns. Negative mcmurrays, apleys, patellar apprehension. NV intact distally.    Assessment & Plan:  1. Bilateral ankle sprains - radiographs negative of left lower leg including ankle.  No bony tenderness on right.  Shown home exercises to do daily.  Cam walker on left, ASO right.  Icing as needed.  NSAIDs with percocet as needed.  F/u in 2 weeks.  2. Right knee  contusion - Reassured.  Icing, nsaids, percocet as needed.  3. Bilateral wrist sprains - No snuffbox or distal radius tenderness.  Do not think she needs radiographs on these areas.  Discussed considering wrist braces.  Icing, nsaids with percocet as needed.

## 2014-10-28 ENCOUNTER — Encounter: Payer: Self-pay | Admitting: Family Medicine

## 2014-10-28 ENCOUNTER — Encounter (INDEPENDENT_AMBULATORY_CARE_PROVIDER_SITE_OTHER): Payer: Self-pay

## 2014-10-28 ENCOUNTER — Ambulatory Visit (INDEPENDENT_AMBULATORY_CARE_PROVIDER_SITE_OTHER): Payer: 59 | Admitting: Family Medicine

## 2014-10-28 VITALS — BP 127/88 | HR 73 | Ht 67.0 in | Wt 250.0 lb

## 2014-10-28 DIAGNOSIS — S93409D Sprain of unspecified ligament of unspecified ankle, subsequent encounter: Secondary | ICD-10-CM | POA: Diagnosis not present

## 2014-10-28 DIAGNOSIS — S63509D Unspecified sprain of unspecified wrist, subsequent encounter: Secondary | ICD-10-CM | POA: Diagnosis not present

## 2014-10-28 NOTE — Patient Instructions (Signed)
Wear wrist brace on right as much as possible including when sleeping. Icing all areas 15 minutes at a time 3-4 times a day. Switch to laceup brace on left side also if tolerated. Start physical therapy for both ankles. Do home exercises on days you don't go to therapy. Follow up with me in 4 weeks after you've started therapy. If not improving I would consider an MRI on your left ankle though based on your exam it's likely to only show a severe ankle sprain.

## 2014-11-04 NOTE — Assessment & Plan Note (Signed)
Bilateral ankle sprains - radiographs negative of left lower leg including ankle.  No bony tenderness on right.  Will add physical therapy for both sides.  HEP on days she doesn't go to therapy.  F/u 4 weeks after starting therapy.  Icing as needed.  Try to switch to using ASOs on both sides.

## 2014-11-04 NOTE — Assessment & Plan Note (Signed)
Bilateral wrist sprains - No snuffbox or distal radius tenderness.  Do not think she needs radiographs on these areas.  Add wrist brace to more symptomatic right wrist.   Icing, nsaids with percocet as needed.

## 2014-11-04 NOTE — Progress Notes (Signed)
PCP: Jackie Plum, MD  Subjective:   HPI: Patient is a 49 y.o. female here for a fall.  2/29: Patient reports on 2/27 she accidentally missed a step when walking forward and inverted left ankle badly, fell onto right knee and ankle. Unable to bear weight comfortably - in wheelchair today. Pain is 9/10 in left ankle, 6/10 right ankle. Had radiographs of left ankle, foot, and tib-fib, right knee negative. Taking ibuprofen and percocet. Using crutches though these feel uncomfortable as does the aircast for left ankle. No prior injuries to these areas.  4/6: Patient returns reporting continued pain in left ankle, right ankle, right wrist. Pain level worst in left ankle at 9/10, right ankle 4/10. Wearing cam boot on left ankle - has to use this and ASO on right to be able to ambulate. Noticing swelling of all areas also.  Past Medical History  Diagnosis Date  . Migraine   . GERD (gastroesophageal reflux disease)   . Lung nodules   . Kidney stones     Current Outpatient Prescriptions on File Prior to Visit  Medication Sig Dispense Refill  . ibuprofen (ADVIL,MOTRIN) 800 MG tablet Take 1 tablet (800 mg total) by mouth 3 (three) times daily. 21 tablet 0  . oxyCODONE-acetaminophen (PERCOCET) 5-325 MG per tablet Take 1 tablet by mouth every 6 (six) hours as needed. 60 tablet 0  . tamsulosin (FLOMAX) 0.4 MG CAPS capsule Take 1 capsule (0.4 mg total) by mouth daily. 10 capsule 0  . topiramate (TOPAMAX) 50 MG tablet Take 50 mg by mouth 2 (two) times daily.     No current facility-administered medications on file prior to visit.    Past Surgical History  Procedure Laterality Date  . Cesarean section    . Mandible surgery    . Tubal ligation      Allergies  Allergen Reactions  . Shellfish Allergy Swelling  . Pork-Derived Products Nausea And Vomiting    History   Social History  . Marital Status: Married    Spouse Name: N/A  . Number of Children: N/A  . Years of  Education: N/A   Occupational History  . Not on file.   Social History Main Topics  . Smoking status: Never Smoker   . Smokeless tobacco: Never Used  . Alcohol Use: No  . Drug Use: No  . Sexual Activity: Yes    Birth Control/ Protection: Surgical   Other Topics Concern  . Not on file   Social History Narrative    No family history on file.  BP 127/88 mmHg  Pulse 73  Ht  (1.702 m)  Wt 250 lb (113.399 kg)  BMI 39.15 kg/m2  Review of Systems: See HPI above.    Objective:  Physical Exam:  Gen: NAD  Left ankle: Mild lateral ankle swelling.  No bruising, other deformity. Very little motion but able to do so all directions. TTP greatest over ATFL, less lat malleolus. 2+ ant drawer and talar tilt, painful. Negative syndesmotic compression. Thompsons test negative. NV intact distally.  Right ankle: No gross deformity, swelling, ecchymoses FROM TTP over ATFL only. 1+ ant drawer and talar tilt.   Negative syndesmotic compression. Thompsons test negative. NV intact distally.  Bilateral wrists: No gross deformity, swelling, bruising. TTP dorsal wrist joints but none over snuffbox, distal radius, other areas of wrists. FROM but painful with extension and flexion. NVI distally.    Assessment & Plan:  1. Bilateral ankle sprains - radiographs negative of left lower leg including  ankle.  No bony tenderness on right.  Will add physical therapy for both sides.  HEP on days she doesn't go to therapy.  F/u 4 weeks after starting therapy.  Icing as needed.  Try to switch to using ASOs on both sides.    3. Bilateral wrist sprains - No snuffbox or distal radius tenderness.  Do not think she needs radiographs on these areas.  Add wrist brace to more symptomatic right wrist.   Icing, nsaids with percocet as needed.

## 2014-11-22 ENCOUNTER — Encounter (HOSPITAL_COMMUNITY): Payer: Self-pay

## 2014-11-22 ENCOUNTER — Emergency Department (HOSPITAL_COMMUNITY)
Admission: EM | Admit: 2014-11-22 | Discharge: 2014-11-22 | Disposition: A | Discharge status: Home / Self Care | Payer: 59 | Attending: Emergency Medicine | Admitting: Emergency Medicine

## 2014-11-22 DIAGNOSIS — R51 Headache: Secondary | ICD-10-CM

## 2014-11-22 DIAGNOSIS — Z87442 Personal history of urinary calculi: Secondary | ICD-10-CM | POA: Insufficient documentation

## 2014-11-22 DIAGNOSIS — Z8719 Personal history of other diseases of the digestive system: Secondary | ICD-10-CM | POA: Diagnosis not present

## 2014-11-22 DIAGNOSIS — Z79899 Other long term (current) drug therapy: Secondary | ICD-10-CM | POA: Insufficient documentation

## 2014-11-22 DIAGNOSIS — G43909 Migraine, unspecified, not intractable, without status migrainosus: Secondary | ICD-10-CM | POA: Diagnosis present

## 2014-11-22 DIAGNOSIS — R519 Headache, unspecified: Secondary | ICD-10-CM

## 2014-11-22 MED ORDER — DIPHENHYDRAMINE HCL 50 MG/ML IJ SOLN
25.0000 mg | Freq: Once | INTRAMUSCULAR | Status: AC
  Administered 2014-11-22: 25 mg via INTRAVENOUS
  Filled 2014-11-22: qty 1

## 2014-11-22 MED ORDER — SODIUM CHLORIDE 0.9 % IV BOLUS (SEPSIS)
1000.0000 mL | Freq: Once | INTRAVENOUS | Status: AC
  Administered 2014-11-22: 1000 mL via INTRAVENOUS

## 2014-11-22 MED ORDER — BUTALBITAL-APAP-CAFFEINE 50-325-40 MG PO TABS: 1.0000 | ORAL_TABLET | Freq: Four times a day (QID) | ORAL | Status: DC | PRN

## 2014-11-22 MED ORDER — KETOROLAC TROMETHAMINE 30 MG/ML IJ SOLN
30.0000 mg | Freq: Once | INTRAMUSCULAR | Status: AC
  Administered 2014-11-22: 30 mg via INTRAVENOUS
  Filled 2014-11-22: qty 1

## 2014-11-22 MED ORDER — PROCHLORPERAZINE EDISYLATE 5 MG/ML IJ SOLN
10.0000 mg | Freq: Once | INTRAMUSCULAR | Status: AC
  Administered 2014-11-22: 10 mg via INTRAVENOUS
  Filled 2014-11-22: qty 2

## 2014-11-22 MED ORDER — DEXAMETHASONE SODIUM PHOSPHATE 10 MG/ML IJ SOLN
10.0000 mg | Freq: Once | INTRAMUSCULAR | Status: AC
  Administered 2014-11-22: 10 mg via INTRAVENOUS
  Filled 2014-11-22: qty 1

## 2014-11-22 MED ORDER — METOCLOPRAMIDE HCL 5 MG/ML IJ SOLN
10.0000 mg | Freq: Once | INTRAMUSCULAR | Status: AC
  Administered 2014-11-22: 10 mg via INTRAVENOUS
  Filled 2014-11-22: qty 2

## 2014-11-22 NOTE — ED Notes (Signed)
She c/o typical migraine since yesterday.  She denies fever, nor any other sign of current illness.

## 2014-11-22 NOTE — ED Provider Notes (Signed)
CSN: [id number]     Arrival date & time 11/22/14  1452 History   First MD Initiated Contact with Patient 11/22/14 1535     Chief Complaint  Patient presents with  . Migraine     (Consider location/radiation/quality/duration/timing/severity/associated sxs/prior Treatment) The history is provided by the patient and medical records.   49 y.o. F with PMH significant for migraines, GERD, kidney stones, presenting to the ED for headache.  Patient states headache began yesterday and has been persistently worsening since this time. She states headache is localized to her forehead, described as a throbbing sensation. She reports associated photophobia, phonophobia, nausea, and some mild lightheadedness. States the symptoms are typical of her usual migraines. She denies any numbness, weakness, tinnitus, visual disturbance, changes in speech, ataxia, or confusion. No fevers or neck pain. Patient is prescribed Topamax for her migraines, however she did not take this today. She did take a Percocet which seemed to make her headache worse.  Patient is not currently on any type of anticoagulation.  Past Medical History  Diagnosis Date  . Migraine   . GERD (gastroesophageal reflux disease)   . Lung nodules   . Kidney stones    Past Surgical History  Procedure Laterality Date  . Cesarean section    . Mandible surgery    . Tubal ligation     No family history on file. History  Substance Use Topics  . Smoking status: Never Smoker   . Smokeless tobacco: Never Used  . Alcohol Use: No   OB History    Gravida Para Term Preterm AB TAB SAB Ectopic Multiple Living   Review of Systems  Eyes: Positive for photophobia.  Neurological: Positive for headaches.  All other systems reviewed and are negative.      Allergies  Shellfish allergy and Pork-derived products  Home Medications   Prior to Admission medications   Medication Sig Start Date End Date Taking? Authorizing Provider   ibuprofen (ADVIL,MOTRIN) 600 MG tablet Take 600 mg by mouth every 6 (six) hours as needed for headache or moderate pain.   Yes Historical Provider, MD  oxyCODONE-acetaminophen (PERCOCET) 5-325 MG per tablet Take 1 tablet by mouth every 6 (six) hours as needed. 09/21/14  Yes Lenda Kelp, MD  topiramate (TOPAMAX) 50 MG tablet Take 50 mg by mouth 2 (two) times daily.   Yes Historical Provider, MD  ibuprofen (ADVIL,MOTRIN) 800 MG tablet Take 1 tablet (800 mg total) by mouth 3 (three) times daily. Patient not taking: Reported on 11/22/2014 09/19/14   Richardean Canal, MD  tamsulosin (FLOMAX) 0.4 MG CAPS capsule Take 1 capsule (0.4 mg total) by mouth daily. Patient not taking: Reported on 11/22/2014 08/05/14   Tatyana Kirichenko, PA-C   BP 120/87 mmHg  Pulse 76  Temp(Src) 98 F (36.7 C) (Oral)  Resp 17  SpO2 99%   Physical Exam  Constitutional: She is oriented to person, place, and time. She appears well-developed and well-nourished. No distress.  HENT:  Head: Normocephalic and atraumatic.  Mouth/Throat: Oropharynx is clear and moist.  Eyes: Conjunctivae and EOM are normal. Pupils are equal, round, and reactive to light.  Neck: Normal range of motion and full passive range of motion without pain. Neck supple. No spinous process tenderness and no muscular tenderness present. No rigidity.  No meningismus, full ROM  Cardiovascular: Normal rate, regular rhythm and normal heart sounds.   Pulmonary/Chest: Effort normal and breath  sounds normal. No respiratory distress. She has no wheezes.  Abdominal: Soft. Bowel sounds are normal.  Musculoskeletal: Normal range of motion.  Neurological: She is alert and oriented to person, place, and time.  AAOx3, answering questions appropriately; equal strength UE and LE bilaterally; CN grossly intact; moves all extremities appropriately without ataxia; no focal neuro deficits or facial asymmetry appreciated  Skin: Skin is warm and dry. She is not diaphoretic.   Psychiatric: She has a normal mood and affect.  Nursing note and vitals reviewed.   ED Course  Procedures (including critical care time) Labs Review Labs Reviewed - No data to display  Imaging Review No results found.   EKG Interpretation None      MDM   Final diagnoses:  Headache, unspecified headache type   49 y.o. F here with typical migraine headache for the past 2 days.  She is afebrile and non-toxic without clinical signs to suggest meningitis.  Her neurologic exam is non-focal.  Patient given migraine cocktail with improvement of her headache.  She remains neurologically intact.  Patient feels she can rest comfortably at home.  Will d/c home with fioricet, also encouraged to continue topamax for headaches.  FU with PCP.  Discussed plan with patient, he/she acknowledged understanding and agreed with plan of care.  Return precautions given for new or worsening symptoms.  Garlon HatchetLisa M Oliwia Berzins, PA-C 11/22/14 2019  Doug SouSam Jacubowitz, MD 11/22/14 [phone]

## 2014-11-22 NOTE — Discharge Instructions (Signed)
Take the prescribed medication as directed.  May take your topamax with this as well. Follow-up with your primary care physician. Return to the ED for new or worsening symptoms.

## 2014-11-22 NOTE — ED Notes (Signed)
Pt reported having migraines with blurred vision, nausea, and dizziness/lightheadedness. Pt reported that pain decreases in dk room and she took a Percocet and pain got worse.

## 2014-11-22 NOTE — ED Notes (Signed)
Awake. Verbally responsive. A/O x4. Resp even and unlabored. No audible adventitious breath sounds noted. ABC's intact. IV patent and intact infusing NS at 96499ml/hr without difficulty.

## 2014-11-22 NOTE — ED Notes (Signed)
Awake. Verbally responsive. A/O x4. Resp even and unlabored. No audible adventitious breath sounds noted. ABC's intact. Pt ambulated to BR to void. Room kept dark. Family at bedside.

## 2015-06-14 ENCOUNTER — Encounter (HOSPITAL_COMMUNITY): Payer: Self-pay

## 2015-06-14 ENCOUNTER — Emergency Department (HOSPITAL_COMMUNITY)
Admission: EM | Admit: 2015-06-14 | Discharge: 2015-06-15 | Disposition: A | Discharge status: Home / Self Care | Payer: PRIVATE HEALTH INSURANCE | Attending: Emergency Medicine | Admitting: Emergency Medicine

## 2015-06-14 DIAGNOSIS — R51 Headache: Secondary | ICD-10-CM | POA: Insufficient documentation

## 2015-06-14 DIAGNOSIS — M25562 Pain in left knee: Secondary | ICD-10-CM | POA: Insufficient documentation

## 2015-06-14 DIAGNOSIS — R519 Headache, unspecified: Secondary | ICD-10-CM

## 2015-06-14 DIAGNOSIS — Z8719 Personal history of other diseases of the digestive system: Secondary | ICD-10-CM | POA: Insufficient documentation

## 2015-06-14 DIAGNOSIS — M25572 Pain in left ankle and joints of left foot: Secondary | ICD-10-CM | POA: Insufficient documentation

## 2015-06-14 DIAGNOSIS — R110 Nausea: Secondary | ICD-10-CM | POA: Insufficient documentation

## 2015-06-14 DIAGNOSIS — M25561 Pain in right knee: Secondary | ICD-10-CM | POA: Insufficient documentation

## 2015-06-14 DIAGNOSIS — G8929 Other chronic pain: Secondary | ICD-10-CM | POA: Insufficient documentation

## 2015-06-14 DIAGNOSIS — I159 Secondary hypertension, unspecified: Secondary | ICD-10-CM | POA: Insufficient documentation

## 2015-06-14 DIAGNOSIS — M25571 Pain in right ankle and joints of right foot: Secondary | ICD-10-CM | POA: Insufficient documentation

## 2015-06-14 DIAGNOSIS — R42 Dizziness and giddiness: Secondary | ICD-10-CM | POA: Insufficient documentation

## 2015-06-14 DIAGNOSIS — H538 Other visual disturbances: Secondary | ICD-10-CM | POA: Insufficient documentation

## 2015-06-14 DIAGNOSIS — Z87442 Personal history of urinary calculi: Secondary | ICD-10-CM | POA: Insufficient documentation

## 2015-06-14 NOTE — ED Notes (Signed)
Pt complains of feeling dizzy and nauseated for two days, checked BP at [facility] and it was in the 200's, no BP hx

## 2015-06-14 NOTE — ED Provider Notes (Signed)
CSN: [id number]     Arrival date & time 06/14/15  2019 History  By signing my name below, I, Monique Tyler, attest that this documentation has been prepared under the direction and in the presence of Devoria AlbeIva Zaahir Pickney, MD at 0015. Electronically Signed: Angelene GiovanniEmmanuella Tyler, ED Scribe. 06/15/2015. 2:46 AM.     Chief Complaint  Patient presents with  . Hypertension   The history is provided by the patient. No language interpreter was used.   HPI Comments: Monique Tyler is a 49 y.o. female with a hx of migraines who presents to the Emergency Department complaining of gradually worsening moderate intermittent dizziness as if she is "going to pass out" onset 2 days ago. She reports associated nausea, pressure and achy temporal HA, and mild blurry vision. She denies any vomiting, photophobia, noise sensitivity, acute numbness/tingling, or leg swelling. No difficulty walking. She has chronic pain in her knees and ankles. She reports that the dizziness is worse when she stands up or even lifting her head off the stretcher. She states that when she went to [facility] to check her BP tonight it was 223/123 and the pharmacist advised her to come to the ER. She explains that she normally has frontal and occipital HA with her migraines that she describes as sharp with nausea and vomiting.. Pt has electric heat at her house. She states currently she still has nausea, dizziness, and headache. However at the time of my exam her blood pressure is normal without treatment.  PCP: Dr. Julio Sickssei-Bonsu but has not been in a while.   Past Medical History  Diagnosis Date  . Migraine   . GERD (gastroesophageal reflux disease)   . Lung nodules   . Kidney stones    Past Surgical History  Procedure Laterality Date  . Cesarean section    . Mandible surgery    . Tubal ligation     History reviewed. No pertinent family history. Social History  Substance Use Topics  . Smoking status: Never Smoker   . Smokeless tobacco:  Never Used  . Alcohol Use: No   employed  OB History    Gravida Para Term Preterm AB TAB SAB Ectopic Multiple Living   5 5       1       Review of Systems  Constitutional: Negative for fever.  Eyes: Positive for visual disturbance.  Gastrointestinal: Positive for nausea. Negative for vomiting.  Musculoskeletal: Negative for joint swelling.  Neurological: Positive for dizziness and headaches. Negative for numbness.  All other systems reviewed and are negative.     Allergies  Shellfish allergy and Pork-derived products  Home Medications   Prior to Admission medications   Medication Sig Start Date End Date Taking? Authorizing Provider  ibuprofen (ADVIL,MOTRIN) 600 MG tablet Take 600 mg by mouth every 6 (six) hours as needed for headache or moderate pain.   Yes Historical Provider, MD  butalbital-acetaminophen-caffeine (FIORICET) 50-325-40 MG per tablet Take 1 tablet by mouth every 6 (six) hours as needed for headache. Patient not taking: Reported on 06/14/2015 11/22/14 11/22/15  Garlon HatchetLisa M Sanders, PA-C  ibuprofen (ADVIL,MOTRIN) 800 MG tablet Take 1 tablet (800 mg total) by mouth 3 (three) times daily. Patient not taking: Reported on 11/22/2014 09/19/14   Richardean Canalavid H Yao, MD  oxyCODONE-acetaminophen (PERCOCET) 5-325 MG per tablet Take 1 tablet by mouth every 6 (six) hours as needed. Patient not taking: Reported on 06/14/2015 09/21/14   Lenda KelpShane R Hudnall, MD  tamsulosin (FLOMAX) 0.4 MG CAPS capsule Take 1  capsule (0.4 mg total) by mouth daily. Patient not taking: Reported on 11/22/2014 08/05/14   Tatyana Kirichenko, PA-C   BP 127/78 mmHg  Pulse 67  Temp(Src) 98.5 F (36.9 C) (Oral)  Resp 21  Ht  (1.702 m)  Wt 250 lb (113.399 kg)  BMI 39.15 kg/m2  SpO2 95%  Vital signs normal   Physical Exam  Constitutional: She is oriented to person, place, and time. She appears well-developed and well-nourished.  Non-toxic appearance. She does not appear ill. No distress.  HENT:  Head: Normocephalic  and atraumatic.  Right Ear: External ear normal.  Left Ear: External ear normal.  Nose: Nose normal. No mucosal edema or rhinorrhea.  Mouth/Throat: Oropharynx is clear and moist and mucous membranes are normal. No dental abscesses or uvula swelling.  Eyes: Conjunctivae and EOM are normal. Pupils are equal, round, and reactive to light.  Neck: Normal range of motion and full passive range of motion without pain. Neck supple.  Cardiovascular: Normal rate, regular rhythm and normal heart sounds.  Exam reveals no gallop and no friction rub.   No murmur heard. Pulmonary/Chest: Effort normal and breath sounds normal. No respiratory distress. She has no wheezes. She has no rhonchi. She has no rales. She exhibits no tenderness and no crepitus.  Abdominal: Soft. Normal appearance and bowel sounds are normal. She exhibits no distension. There is no tenderness. There is no rebound and no guarding.  Musculoskeletal: Normal range of motion. She exhibits no edema or tenderness.  Moves all extremities well.  Grips were equal  Neurological: She is alert and oriented to person, place, and time. She has normal strength. No cranial nerve deficit.  Skin: Skin is warm, dry and intact. No rash noted. No erythema. No pallor.  Psychiatric: She has a normal mood and affect. Her speech is normal and behavior is normal. Her mood appears not anxious.  Nursing note and vitals reviewed.   ED Course  Procedures (including critical care time)  Medications  0.9 %  sodium chloride infusion ( Intravenous Restarted 06/15/15 0225)  metoCLOPramide (REGLAN) injection 10 mg (10 mg Intravenous Given 06/15/15 0105)  diphenhydrAMINE (BENADRYL) injection 25 mg (25 mg Intravenous Given 06/15/15 0105)  sodium chloride 0.9 % bolus 500 mL (0 mLs Intravenous Stopped 06/15/15 0127)  dexamethasone (DECADRON) injection 10 mg (10 mg Intravenous Given 06/15/15 0305)    DIAGNOSTIC STUDIES: Oxygen Saturation is 100% on RA, normal by my  interpretation.    COORDINATION OF CARE: 12:25 AM- Pt advised of plan for treatment and pt agrees. Will receive IV fluids. She was given the initial migraine cocktail of Reglan and Benadryl IV.  2:46 AM - Upon reevaluation, pt still has pain after receiving medication. Ordered Decadron.  Recheck at time of discharge and her headache is gone, she is ready to be discharged.     MDM   patient presents with headache and dizziness and checked her blood pressure at a pharmacy which was very elevated at 220/123. However her blood pressure improved in the ED without specific treatment other than treating her headache.    Final diagnoses:  Headache, unspecified headache type  Secondary hypertension, unspecified      Plan discharge  Devoria Albe, MD, FACEP   I personally performed the services described in this documentation, which was scribed in my presence. The recorded information has been reviewed and considered.  Devoria Albe, MD, Concha Pyo, MD 06/15/15 [phone]

## 2015-06-15 MED ORDER — DEXAMETHASONE SODIUM PHOSPHATE 10 MG/ML IJ SOLN
10.0000 mg | Freq: Once | INTRAMUSCULAR | Status: AC
  Administered 2015-06-15: 10 mg via INTRAVENOUS
  Filled 2015-06-15: qty 1

## 2015-06-15 MED ORDER — SODIUM CHLORIDE 0.9 % IV SOLN
INTRAVENOUS | Status: DC
  Administered 2015-06-15: 01:00:00 via INTRAVENOUS

## 2015-06-15 MED ORDER — METOCLOPRAMIDE HCL 5 MG/ML IJ SOLN
10.0000 mg | Freq: Once | INTRAMUSCULAR | Status: AC
  Administered 2015-06-15: 10 mg via INTRAVENOUS
  Filled 2015-06-15: qty 2

## 2015-06-15 MED ORDER — DIPHENHYDRAMINE HCL 50 MG/ML IJ SOLN
25.0000 mg | Freq: Once | INTRAMUSCULAR | Status: AC
  Administered 2015-06-15: 25 mg via INTRAVENOUS
  Filled 2015-06-15: qty 1

## 2015-06-15 MED ORDER — SODIUM CHLORIDE 0.9 % IV BOLUS (SEPSIS)
500.0000 mL | Freq: Once | INTRAVENOUS | Status: AC
  Administered 2015-06-15: 500 mL via INTRAVENOUS

## 2015-06-15 NOTE — Discharge Instructions (Signed)
Go home and rest. Have your blood pressure checked in the next week. It improved in the ED without treatment.

## 2015-06-18 ENCOUNTER — Encounter (HOSPITAL_BASED_OUTPATIENT_CLINIC_OR_DEPARTMENT_OTHER): Payer: Self-pay

## 2015-06-18 ENCOUNTER — Encounter (HOSPITAL_COMMUNITY): Payer: Self-pay | Admitting: Vascular Surgery

## 2015-06-18 ENCOUNTER — Emergency Department (HOSPITAL_COMMUNITY)
Admission: EM | Admit: 2015-06-18 | Discharge: 2015-06-18 | Discharge status: Against Medical Advice | Payer: PRIVATE HEALTH INSURANCE | Attending: Emergency Medicine | Admitting: Emergency Medicine

## 2015-06-18 ENCOUNTER — Emergency Department (HOSPITAL_BASED_OUTPATIENT_CLINIC_OR_DEPARTMENT_OTHER)
Admission: EM | Admit: 2015-06-18 | Discharge: 2015-06-18 | Disposition: A | Discharge status: Home / Self Care | Payer: PRIVATE HEALTH INSURANCE | Attending: Emergency Medicine | Admitting: Emergency Medicine

## 2015-06-18 ENCOUNTER — Emergency Department (HOSPITAL_BASED_OUTPATIENT_CLINIC_OR_DEPARTMENT_OTHER): Payer: PRIVATE HEALTH INSURANCE

## 2015-06-18 DIAGNOSIS — R110 Nausea: Secondary | ICD-10-CM | POA: Insufficient documentation

## 2015-06-18 DIAGNOSIS — R42 Dizziness and giddiness: Secondary | ICD-10-CM | POA: Insufficient documentation

## 2015-06-18 DIAGNOSIS — R5381 Other malaise: Secondary | ICD-10-CM | POA: Insufficient documentation

## 2015-06-18 DIAGNOSIS — J029 Acute pharyngitis, unspecified: Secondary | ICD-10-CM | POA: Insufficient documentation

## 2015-06-18 DIAGNOSIS — R200 Anesthesia of skin: Secondary | ICD-10-CM | POA: Insufficient documentation

## 2015-06-18 DIAGNOSIS — I10 Essential (primary) hypertension: Secondary | ICD-10-CM | POA: Insufficient documentation

## 2015-06-18 DIAGNOSIS — R519 Headache, unspecified: Secondary | ICD-10-CM

## 2015-06-18 DIAGNOSIS — R002 Palpitations: Secondary | ICD-10-CM | POA: Insufficient documentation

## 2015-06-18 DIAGNOSIS — R51 Headache: Secondary | ICD-10-CM | POA: Insufficient documentation

## 2015-06-18 DIAGNOSIS — R0602 Shortness of breath: Secondary | ICD-10-CM | POA: Insufficient documentation

## 2015-06-18 DIAGNOSIS — Z87442 Personal history of urinary calculi: Secondary | ICD-10-CM | POA: Insufficient documentation

## 2015-06-18 DIAGNOSIS — Z8719 Personal history of other diseases of the digestive system: Secondary | ICD-10-CM | POA: Insufficient documentation

## 2015-06-18 LAB — BASIC METABOLIC PANEL
Anion gap: 7 (ref 5–15)
BUN: 12 mg/dL (ref 6–20)
CALCIUM: 9.2 mg/dL (ref 8.9–10.3)
CO2: 25 mmol/L (ref 22–32)
CREATININE: 0.72 mg/dL (ref 0.44–1.00)
Chloride: 108 mmol/L (ref 101–111)
GFR calc Af Amer: >60 mL/min (ref >60)
Glucose, Bld: 111 mg/dL — ABNORMAL HIGH (ref 65–99)
Potassium: 3.5 mmol/L (ref 3.5–5.1)
SODIUM: 140 mmol/L (ref 135–145)

## 2015-06-18 LAB — CBC WITH DIFFERENTIAL/PLATELET
Basophils Absolute: 0 10*3/uL (ref 0.0–0.1)
Basophils Relative: 0 %
EOS ABS: 0.1 10*3/uL (ref 0.0–0.7)
EOS PCT: 1 %
HCT: 38.2 % (ref 36.0–46.0)
Hemoglobin: 12 g/dL (ref 12.0–15.0)
LYMPHS ABS: 2.8 10*3/uL (ref 0.7–4.0)
Lymphocytes Relative: 32 %
MCH: 26.3 pg (ref 26.0–34.0)
MCHC: 31.4 g/dL (ref 30.0–36.0)
MCV: 83.6 fL (ref 78.0–100.0)
MONOS PCT: 10 %
Monocytes Absolute: 0.8 10*3/uL (ref 0.1–1.0)
Neutro Abs: 5 10*3/uL (ref 1.7–7.7)
Neutrophils Relative %: 57 %
Platelets: 305 10*3/uL (ref 150–400)
RBC: 4.57 MIL/uL (ref 3.87–5.11)
RDW: 13.7 % (ref 11.5–15.5)
WBC: 8.7 10*3/uL (ref 4.0–10.5)

## 2015-06-18 LAB — URINALYSIS, ROUTINE W REFLEX MICROSCOPIC
BILIRUBIN URINE: NEGATIVE
Glucose, UA: NEGATIVE mg/dL
KETONES UR: NEGATIVE mg/dL
Nitrite: NEGATIVE
Protein, ur: NEGATIVE mg/dL
Specific Gravity, Urine: 1.01 (ref 1.005–1.030)
pH: 6 (ref 5.0–8.0)

## 2015-06-18 LAB — URINE MICROSCOPIC-ADD ON

## 2015-06-18 MED ORDER — ONDANSETRON 4 MG PO TBDP
ORAL_TABLET | ORAL | Status: AC
  Administered 2015-06-18: 4 mg
  Filled 2015-06-18: qty 1

## 2015-06-18 MED ORDER — ONDANSETRON 4 MG PO TBDP: 4.0000 mg | ORAL_TABLET | Freq: Once | ORAL | Status: DC | PRN

## 2015-06-18 NOTE — ED Notes (Addendum)
C/o elevated BP, dizziness and nausea x 1 week-denies hx of HTN-was [facility]-states she left [place] after 4 hour wait-steady gait

## 2015-06-18 NOTE — Discharge Instructions (Signed)
Hypertension Hypertension, commonly called high blood pressure, is when the force of blood pumping through your arteries is too strong. Your arteries are the blood vessels that carry blood from your heart throughout your body. A blood pressure reading consists of a higher number over a lower number, such as 110/72. The higher number (systolic) is the pressure inside your arteries when your heart pumps. The lower number (diastolic) is the pressure inside your arteries when your heart relaxes. Ideally you want your blood pressure below 120/80. Hypertension forces your heart to work harder to pump blood. Your arteries may become narrow or stiff. Having untreated or uncontrolled hypertension can cause heart attack, stroke, kidney disease, and other problems. RISK FACTORS Some risk factors for high blood pressure are controllable. Others are not.  Risk factors you cannot control include:   Race. You may be at higher risk if you are African American.  Age. Risk increases with age.  Gender. Men are at higher risk than women before age 45 years. After age 65, women are at higher risk than men. Risk factors you can control include:  Not getting enough exercise or physical activity.  Being overweight.  Getting too much fat, sugar, calories, or salt in your diet.  Drinking too much alcohol. SIGNS AND SYMPTOMS Hypertension does not usually cause signs or symptoms. Extremely high blood pressure (hypertensive crisis) may cause headache, anxiety, shortness of breath, and nosebleed. DIAGNOSIS To check if you have hypertension, your health care provider will measure your blood pressure while you are seated, with your arm held at the level of your heart. It should be measured at least twice using the same arm. Certain conditions can cause a difference in blood pressure between your right and left arms. A blood pressure reading that is higher than normal on one occasion does not mean that you need treatment. If  it is not clear whether you have high blood pressure, you may be asked to return on a different day to have your blood pressure checked again. Or, you may be asked to monitor your blood pressure at home for 1 or more weeks. TREATMENT Treating high blood pressure includes making lifestyle changes and possibly taking medicine. Living a healthy lifestyle can help lower high blood pressure. You may need to change some of your habits. Lifestyle changes may include:  Following the DASH diet. This diet is high in fruits, vegetables, and whole grains. It is low in salt, red meat, and added sugars.  Keep your sodium intake below 2,300 mg per day.  Getting at least 30-45 minutes of aerobic exercise at least 4 times per week.  Losing weight if necessary.  Not smoking.  Limiting alcoholic beverages.  Learning ways to reduce stress. Your health care provider may prescribe medicine if lifestyle changes are not enough to get your blood pressure under control, and if one of the following is true:  You are 18-59 years of age and your systolic blood pressure is above 140.  You are 60 years of age or older, and your systolic blood pressure is above 150.  Your diastolic blood pressure is above 90.  You have diabetes, and your systolic blood pressure is over 140 or your diastolic blood pressure is over 90.  You have kidney disease and your blood pressure is above 140/90.  You have heart disease and your blood pressure is above 140/90. Your personal target blood pressure may vary depending on your medical conditions, your age, and other factors. HOME CARE INSTRUCTIONS    Have your blood pressure rechecked as directed by your health care provider.   Take medicines only as directed by your health care provider. Follow the directions carefully. Blood pressure medicines must be taken as prescribed. The medicine does not work as well when you skip doses. Skipping doses also puts you at risk for  problems.  Do not smoke.   Monitor your blood pressure at home as directed by your health care provider. SEEK MEDICAL CARE IF:   You think you are having a reaction to medicines taken.  You have recurrent headaches or feel dizzy.  You have swelling in your ankles.  You have trouble with your vision. SEEK IMMEDIATE MEDICAL CARE IF:  You develop a severe headache or confusion.  You have unusual weakness, numbness, or feel faint.  You have severe chest or abdominal pain.  You vomit repeatedly.  You have trouble breathing. MAKE SURE YOU:   Understand these instructions.  Will watch your condition.  Will get help right away if you are not doing well or get worse.   This information is not intended to replace advice given to you by your health care provider. Make sure you discuss any questions you have with your health care provider.   Document Released: 07/10/2005 Document Revised: 11/24/2014 Document Reviewed: 05/02/2013 Elsevier Interactive Patient Education 2016 [employer]  

## 2015-06-18 NOTE — ED Notes (Signed)
Family member spoke with Judeth CornfieldStephanie, EMT and said they were going to another hospital.  Did not speak with nurse first prior to leaving.

## 2015-06-18 NOTE — ED Notes (Signed)
Pt husband stated that "they can't deal with this, we going somewhere else." They left the ED waiting room.

## 2015-06-18 NOTE — ED Notes (Addendum)
Family member requested to secretary for vitals to be rechecked.

## 2015-06-18 NOTE — ED Notes (Signed)
Patient is alert and oriented x3.  She was given DC instructions and follow up visit instructions.  Patient gave verbal understanding. She was DC ambulatory under her own power to home.  V/S stable.  He was not showing any signs of distress on DC 

## 2015-06-18 NOTE — ED Notes (Signed)
Pt reports to the ED for eval of HTN and dizziness x 1 week. Pt reports [facility] was [facility] and was d/c home with a follow up appt. Pt reports she took her BP today and it was 211/112 and she has been very dizzy, lightheaded, and nauseated. Denies any active vomiting. Pt also reports a 6/10 HA. Pt A&Ox4, resp e/u, and skin warm and dry.

## 2015-06-18 NOTE — ED Provider Notes (Signed)
CSN: [id number]     Arrival date & time 06/18/15  2059 History  By signing my name below, I, Budd Palmer, attest that this documentation has been prepared under the direction and in the presence of Mirian Mo, MD. Electronically Signed: Budd Palmer, ED Scribe. 06/18/2015. 9:28 PM.    Chief Complaint  Patient presents with  . Hypertension   Patient is a 49 y.o. female presenting with hypertension. The history is provided by the patient. No language interpreter was used.  Hypertension This is a new problem. The current episode started more than 2 days ago. The problem occurs constantly. The problem has not changed since onset.Associated symptoms include headaches and shortness of breath. The symptoms are aggravated by standing and walking. The symptoms are relieved by lying down. She has tried nothing for the symptoms.   HPI Comments: Monique Tyler is a 49 y.o. female with a PMHx of migraine who presents to the Emergency Department complaining of HTN onset 1 week ago. She states her BP was 234/120 when she measured it last at [employer]. She notes she was seen for the same at [facility] 4 days ago. She reports associated mild HA, dizziness, nausea, lightheadedness, SOB, numbness in both feet as well as her left arm, and palpitations. She notes having flu-like symptoms including sore throat for the past few weeks. She reports exacerbation of the symptoms with standing up and walking, and alleviation with lying down. She states she is not currently on any blood pressure medication. Pt denies v/d, constipation, fever, dysuria, cough, congestion, and visual disturbances.   Past Medical History  Diagnosis Date  . Migraine   . GERD (gastroesophageal reflux disease)   . Lung nodules   . Kidney stones    Past Surgical History  Procedure Laterality Date  . Cesarean section    . Mandible surgery    . Tubal ligation     No family history on file. Social History  Substance Use Topics   . Smoking status: Never Smoker   . Smokeless tobacco: Never Used  . Alcohol Use: No   OB History    Gravida Para Term Preterm AB TAB SAB Ectopic Multiple Living   Review of Systems  Constitutional: Negative for fever.  HENT: Positive for sore throat. Negative for congestion.   Eyes: Negative for visual disturbance.  Respiratory: Positive for shortness of breath. Negative for cough.   Cardiovascular: Positive for palpitations.  Gastrointestinal: Positive for nausea. Negative for vomiting, diarrhea and constipation.  Genitourinary: Negative for dysuria.  Neurological: Positive for dizziness, light-headedness, numbness and headaches.  All other systems reviewed and are negative.   Allergies  Shellfish allergy and Pork-derived products  Home Medications   Prior to Admission medications   Not on File   BP 158/89 mmHg  Pulse 94  Temp(Src) 98 F (36.7 C) (Oral)  Resp 18  Ht  (1.702 m)  Wt 250 lb (113.399 kg)  BMI 39.15 kg/m2  SpO2 100%  LMP  Physical Exam  Constitutional: She is oriented to person, place, and time. She appears well-developed and well-nourished.  HENT:  Head: Normocephalic and atraumatic.  Right Ear: External ear normal.  Left Ear: External ear normal.  Eyes: Conjunctivae and EOM are normal. Pupils are equal, round, and reactive to light.  Neck: Normal range of motion. Neck supple.  Cardiovascular: Normal rate, regular rhythm, normal heart sounds and intact distal pulses.  Pulmonary/Chest: Effort normal and breath sounds normal.  Abdominal: Soft. Bowel sounds are normal. There is no tenderness.  Musculoskeletal: Normal range of motion.  Neurological: She is alert and oriented to person, place, and time. She has normal strength and normal reflexes. No cranial nerve deficit or sensory deficit. GCS eye subscore is 4. GCS verbal subscore is 5. GCS motor subscore is 6.  Skin: Skin is warm and dry.  Vitals reviewed.   ED Course   Procedures  DIAGNOSTIC STUDIES: Oxygen Saturation is 100% on RA, normal by my interpretation.    COORDINATION OF CARE: 9:26 PM - Discussed plans to order diagnostic studies. Advised pt to f/u with her PCP for treatment of HTN. Pt advised of plan for treatment and pt agrees.  Labs Review Labs Reviewed  BASIC METABOLIC PANEL - Abnormal; Notable for the following:    Glucose, Bld 111 (*)    All other components within normal limits  URINALYSIS, ROUTINE W REFLEX MICROSCOPIC (NOT AT [facility]) - Abnormal; Notable for the following:    APPearance CLOUDY (*)    Hgb urine dipstick SMALL (*)    Leukocytes, UA TRACE (*)    All other components within normal limits  URINE MICROSCOPIC-ADD ON - Abnormal; Notable for the following:    Squamous Epithelial / LPF 6-30 (*)    Bacteria, UA FEW (*)    All other components within normal limits  CBC WITH DIFFERENTIAL/PLATELET    Imaging Review Dg Chest 2 View  06/18/2015  CLINICAL DATA:  Hypertension for 1 week. Headaches, dizziness, nausea, lightheadedness, shortness of breath, numbness in both feet and left arm, palpitations. Flu-like symptoms for a few weeks. EXAM: CHEST  2 VIEW COMPARISON:  05/04/2014 FINDINGS: The heart size and mediastinal contours are within normal limits. Both lungs are clear. The visualized skeletal structures are unremarkable. IMPRESSION: No active cardiopulmonary disease. Electronically Signed   By: Burman NievesWilliam  Stevens M.D.   On: 06/18/2015 22:33   I have personally reviewed and evaluated these images and lab results as part of my medical decision-making.   EKG Interpretation   Date/Time:  Friday June 18 2015 21:33:23 EST Ventricular Rate:  69 PR Interval:  178 QRS Duration: 86 QT Interval:  404 QTC Calculation: 432 R Axis:   40 Text Interpretation:  Normal sinus rhythm with sinus arrhythmia Normal ECG  No significant change since last tracing Confirmed by Mirian MoGentry, Matthew  [phone]([id number]) on 06/18/2015 10:35:48 PM      MDM    Final diagnoses:  Essential hypertension  Acute nonintractable headache, unspecified headache type  Malaise    49 y.o. female with pertinent PMH of migraine, prior nephrolithiasis presents with primary concern of hypertension, but also with ha, malaise, dizziness.  Symptoms intermittent over weeks.  On arrival pt essentially asymptomatic.  Vitals and physical exam as above benign.  Wu unremarkable.  Discussed that she should fu with PCP and would likely be started on antihypertensive, however given rapid improvement in BP without intervention do not feel starting medication is currently warranted.  DC home in stable condition.    I have reviewed all laboratory and imaging studies if ordered as above  1. Essential hypertension   2. Acute nonintractable headache, unspecified headache type   3. Drema DallasMalaise           Matthew Gentry, MD 06/19/15 [phone]

## 2015-06-19 ENCOUNTER — Emergency Department (INDEPENDENT_AMBULATORY_CARE_PROVIDER_SITE_OTHER)
Admission: EM | Admit: 2015-06-19 | Discharge: 2015-06-19 | Disposition: A | Discharge status: Home / Self Care | Payer: Self-pay | Source: Home / Self Care | Attending: Emergency Medicine | Admitting: Emergency Medicine

## 2015-06-19 ENCOUNTER — Encounter (HOSPITAL_COMMUNITY): Payer: Self-pay | Admitting: Emergency Medicine

## 2015-06-19 DIAGNOSIS — I10 Essential (primary) hypertension: Secondary | ICD-10-CM

## 2015-06-19 DIAGNOSIS — G44219 Episodic tension-type headache, not intractable: Secondary | ICD-10-CM

## 2015-06-19 MED ORDER — KETOROLAC TROMETHAMINE 60 MG/2ML IM SOLN
INTRAMUSCULAR | Status: AC
  Filled 2015-06-19: qty 2

## 2015-06-19 MED ORDER — ONDANSETRON 4 MG PO TBDP
ORAL_TABLET | ORAL | Status: AC
  Filled 2015-06-19: qty 1

## 2015-06-19 MED ORDER — DIPHENHYDRAMINE HCL 50 MG/ML IJ SOLN
INTRAMUSCULAR | Status: AC
  Filled 2015-06-19: qty 1

## 2015-06-19 MED ORDER — KETOROLAC TROMETHAMINE 60 MG/2ML IM SOLN
60.0000 mg | Freq: Once | INTRAMUSCULAR | Status: AC
  Administered 2015-06-19: 60 mg via INTRAMUSCULAR

## 2015-06-19 MED ORDER — METOCLOPRAMIDE HCL 5 MG/ML IJ SOLN
INTRAMUSCULAR | Status: AC
  Filled 2015-06-19: qty 2

## 2015-06-19 MED ORDER — METOCLOPRAMIDE HCL 5 MG/ML IJ SOLN
10.0000 mg | Freq: Once | INTRAMUSCULAR | Status: AC
  Administered 2015-06-19: 10 mg via INTRAMUSCULAR

## 2015-06-19 MED ORDER — NAPROXEN 375 MG PO TABS: 375.0000 mg | ORAL_TABLET | Freq: Two times a day (BID) | ORAL | Status: DC

## 2015-06-19 MED ORDER — ONDANSETRON 4 MG PO TBDP
4.0000 mg | ORAL_TABLET | Freq: Once | ORAL | Status: AC
  Administered 2015-06-19: 4 mg via ORAL

## 2015-06-19 MED ORDER — DIPHENHYDRAMINE HCL 50 MG/ML IJ SOLN
50.0000 mg | Freq: Once | INTRAMUSCULAR | Status: AC
  Administered 2015-06-19: 50 mg via INTRAMUSCULAR

## 2015-06-19 MED ORDER — ONDANSETRON HCL 4 MG PO TABS: 4.0000 mg | ORAL_TABLET | Freq: Four times a day (QID) | ORAL | Status: DC

## 2015-06-19 NOTE — ED Notes (Signed)
C/o HA onset last Monday associated w/nausea, dizzy, facial pressure Reports she "feels like passing out" Has been to ER this month multiple times for similar sx A&O x4... No acute distress.

## 2015-06-19 NOTE — Discharge Instructions (Signed)
Hypertension Hypertension, commonly called high blood pressure, is when the force of blood pumping through your arteries is too strong. Your arteries are the blood vessels that carry blood from your heart throughout your body. A blood pressure reading consists of a higher number over a lower number, such as 110/72. The higher number (systolic) is the pressure inside your arteries when your heart pumps. The lower number (diastolic) is the pressure inside your arteries when your heart relaxes. Ideally you want your blood pressure below 120/80. Hypertension forces your heart to work harder to pump blood. Your arteries may become narrow or stiff. Having untreated or uncontrolled hypertension can cause heart attack, stroke, kidney disease, and other problems. RISK FACTORS Some risk factors for high blood pressure are controllable. Others are not.  Risk factors you cannot control include:   Race. You may be at higher risk if you are African American.  Age. Risk increases with age.  Gender. Men are at higher risk than women before age 45 years. After age 65, women are at higher risk than men. Risk factors you can control include:  Not getting enough exercise or physical activity.  Being overweight.  Getting too much fat, sugar, calories, or salt in your diet.  Drinking too much alcohol. SIGNS AND SYMPTOMS Hypertension does not usually cause signs or symptoms. Extremely high blood pressure (hypertensive crisis) may cause headache, anxiety, shortness of breath, and nosebleed. DIAGNOSIS To check if you have hypertension, your health care provider will measure your blood pressure while you are seated, with your arm held at the level of your heart. It should be measured at least twice using the same arm. Certain conditions can cause a difference in blood pressure between your right and left arms. A blood pressure reading that is higher than normal on one occasion does not mean that you need treatment. If  it is not clear whether you have high blood pressure, you may be asked to return on a different day to have your blood pressure checked again. Or, you may be asked to monitor your blood pressure at home for 1 or more weeks. TREATMENT Treating high blood pressure includes making lifestyle changes and possibly taking medicine. Living a healthy lifestyle can help lower high blood pressure. You may need to change some of your habits. Lifestyle changes may include:  Following the DASH diet. This diet is high in fruits, vegetables, and whole grains. It is low in salt, red meat, and added sugars.  Keep your sodium intake below 2,300 mg per day.  Getting at least 30-45 minutes of aerobic exercise at least 4 times per week.  Losing weight if necessary.  Not smoking.  Limiting alcoholic beverages.  Learning ways to reduce stress. Your health care provider may prescribe medicine if lifestyle changes are not enough to get your blood pressure under control, and if one of the following is true:  You are 18-59 years of age and your systolic blood pressure is above 140.  You are 60 years of age or older, and your systolic blood pressure is above 150.  Your diastolic blood pressure is above 90.  You have diabetes, and your systolic blood pressure is over 140 or your diastolic blood pressure is over 90.  You have kidney disease and your blood pressure is above 140/90.  You have heart disease and your blood pressure is above 140/90. Your personal target blood pressure may vary depending on your medical conditions, your age, and other factors. HOME CARE INSTRUCTIONS    Have your blood pressure rechecked as directed by your health care provider.   Take medicines only as directed by your health care provider. Follow the directions carefully. Blood pressure medicines must be taken as prescribed. The medicine does not work as well when you skip doses. Skipping doses also puts you at risk for  problems.  Do not smoke.   Monitor your blood pressure at home as directed by your health care provider. SEEK MEDICAL CARE IF:   You think you are having a reaction to medicines taken.  You have recurrent headaches or feel dizzy.  You have swelling in your ankles.  You have trouble with your vision. SEEK IMMEDIATE MEDICAL CARE IF:  You develop a severe headache or confusion.  You have unusual weakness, numbness, or feel faint.  You have severe chest or abdominal pain.  You vomit repeatedly.  You have trouble breathing. MAKE SURE YOU:   Understand these instructions.  Will watch your condition.  Will get help right away if you are not doing well or get worse.   This information is not intended to replace advice given to you by your health care provider. Make sure you discuss any questions you have with your health care provider.   Document Released: 07/10/2005 Document Revised: 11/24/2014 Document Reviewed: 05/02/2013 Elsevier Interactive Patient Education 2016 Elsevier Inc.  Tension Headache A tension headache is a feeling of pain, pressure, or aching that is often felt over the front and sides of the head. The pain can be dull, or it can feel tight (constricting). Tension headaches are not normally associated with nausea or vomiting, and they do not get worse with physical activity. Tension headaches can last from 30 minutes to several days. This is the most common type of headache. CAUSES The exact cause of this condition is not known. Tension headaches often begin after stress, anxiety, or depression. Other triggers may include:  Alcohol.  Too much caffeine, or caffeine withdrawal.  Respiratory infections, such as colds, flu, or sinus infections.  Dental problems or teeth clenching.  Fatigue.  Holding your head and neck in the same position for a long period of time, such as while using a computer.  Smoking. SYMPTOMS Symptoms of this condition  include:  A feeling of pressure around the head.  Dull, aching head pain.  Pain felt over the front and sides of the head.  Tenderness in the muscles of the head, neck, and shoulders. DIAGNOSIS This condition may be diagnosed based on your symptoms and a physical exam. Tests may be done, such as a CT scan or an MRI of your head. These tests may be done if your symptoms are severe or unusual. TREATMENT This condition may be treated with lifestyle changes and medicines to help relieve symptoms. HOME CARE INSTRUCTIONS Managing Pain  Take over-the-counter and prescription medicines only as told by your health care provider.  Lie down in a dark, quiet room when you have a headache.  If directed, apply ice to the head and neck area:  Put ice in a plastic bag.  Place a towel between your skin and the bag.  Leave the ice on for 20 minutes, 2-3 times per day.  Use a heating pad or a hot shower to apply heat to the head and neck area as told by your health care provider. Eating and Drinking  Eat meals on a regular schedule.  Limit alcohol use.  Decrease your caffeine intake, or stop using caffeine. General Instructions  Keep  all follow-up visits as told by your health care provider. This is important.  Keep a headache journal to help find out what may trigger your headaches. For example, write down:  What you eat and drink.  How much sleep you get.  Any change to your diet or medicines.  Try massage or other relaxation techniques.  Limit stress.  Sit up straight, and avoid tensing your muscles.  Do not use tobacco products, including cigarettes, chewing tobacco, or e-cigarettes. If you need help quitting, ask your health care provider.  Exercise regularly as told by your health care provider.  Get 7-9 hours of sleep, or the amount recommended by your health care provider. SEEK MEDICAL CARE IF:  Your symptoms are not helped by medicine.  You have a headache that is  different from what you normally experience.  You have nausea or you vomit.  You have a fever. SEEK IMMEDIATE MEDICAL CARE IF:  Your headache becomes severe.  You have repeated vomiting.  You have a stiff neck.  You have a loss of vision.  You have problems with speech.  You have pain in your eye or ear.  You have muscular weakness or loss of muscle control.  You lose your balance or you have trouble walking.  You feel faint or you pass out.  You have confusion.   This information is not intended to replace advice given to you by your health care provider. Make sure you discuss any questions you have with your health care provider.   Document Released: 07/10/2005 Document Revised: 03/31/2015 Document Reviewed: 11/02/2014 Elsevier Interactive Patient Education [employer]2016 Elsevier Inc.

## 2015-06-19 NOTE — ED Provider Notes (Signed)
CSN: [id number]     Arrival date & time 06/19/15  1810 History   First MD Initiated Contact with Patient 06/19/15 2006     Chief Complaint  Patient presents with  . Headache   (Consider location/radiation/quality/duration/timing/severity/associated sxs/prior Treatment) HPI Comments: 49 year old female presents to the urgent care with complaints of headache associated with nausea and dizziness. Headache is primarily in the bitemporal and lower occipital areas. She has tenderness to the muscles of the posterior neck. She is also concerned about her elevated blood pressures. She has been seen 3 times earlier this week, twice in the emergency department for similar symptoms. She has been given headache cocktails with at least partial if not complete relief. Her exams have been unremarkable. She denies problems with vision, speech, hearing, swallowing. Denies sore throat, earache, chills, fever, URI symptoms, photophobia or vomiting. She does have transient nausea. These are similar symptoms that she has been seen this week. She was also seen in emergency department yesterday for the same thing. She is concerned about her hypertension that she states began this week. When she was in the emergency department 3 months ago she did not have elevated blood pressure.  Patient is a 49 y.o. female presenting with headaches.  Headache Associated symptoms: dizziness and nausea   Associated symptoms: no abdominal pain, no back pain, no cough, no ear pain, no fever, no numbness, no photophobia, no sore throat and no vomiting     Past Medical History  Diagnosis Date  . Migraine   . GERD (gastroesophageal reflux disease)   . Lung nodules   . Kidney stones    Past Surgical History  Procedure Laterality Date  . Cesarean section    . Mandible surgery    . Tubal ligation     No family history on file. Social History  Substance Use Topics  . Smoking status: Never Smoker   . Smokeless tobacco: Never Used   . Alcohol Use: No   OB History    Gravida Para Term Preterm AB TAB SAB Ectopic Multiple Living   Review of Systems  Constitutional: Positive for activity change. Negative for fever and chills.  HENT: Negative for ear pain, sore throat and trouble swallowing.   Eyes: Negative.  Negative for photophobia and visual disturbance.  Respiratory: Negative.  Negative for cough and shortness of breath.   Cardiovascular: Negative.   Gastrointestinal: Positive for nausea. Negative for vomiting and abdominal pain.  Endocrine: Negative for polyuria.  Genitourinary: Negative.   Musculoskeletal: Negative for back pain and joint swelling.  Skin: Negative.   Neurological: Positive for dizziness and headaches. Negative for tremors, syncope, facial asymmetry, speech difficulty and numbness.    Allergies  Shellfish allergy and Pork-derived products  Home Medications   Prior to Admission medications   Not on File   Meds Ordered and Administered this Visit   Medications  ketorolac (TORADOL) injection 60 mg (not administered)  metoCLOPramide (REGLAN) injection 10 mg (not administered)  diphenhydrAMINE (BENADRYL) injection 50 mg (not administered)  ondansetron (ZOFRAN-ODT) disintegrating tablet 4 mg (not administered)    BP 184/106 mmHg  Pulse 82  Temp(Src) 98.3 F (36.8 C) (Oral)  Resp 18  SpO2 100% No data found.   Physical Exam  Constitutional: She is oriented to person, place, and time. She appears well-developed and well-nourished. No distress.  HENT:  Head: Normocephalic and atraumatic.  Right Ear: External ear normal.  Left  Ear: External ear normal.  Mouth/Throat: Oropharynx is clear and moist. No oropharyngeal exudate.  Eyes: Conjunctivae and EOM are normal. Pupils are equal, round, and reactive to light. Right eye exhibits no discharge. Left eye exhibits no discharge.  Neck: Normal range of motion. Neck supple.  Tenderness to the posterior cervical  musculature and lower occiput. Full range of motion. No cervical spine tenderness, swelling or discoloration.  Cardiovascular: Normal rate, regular rhythm, normal heart sounds and intact distal pulses.   Pulmonary/Chest: Effort normal and breath sounds normal. No respiratory distress. She has no wheezes. She has no rales.  Musculoskeletal: Normal range of motion. She exhibits no edema or tenderness.  Lymphadenopathy:    She has no cervical adenopathy.  Neurological: She is alert and oriented to person, place, and time. She has normal strength. She displays no tremor. No cranial nerve deficit or sensory deficit. She exhibits normal muscle tone. She displays a negative Romberg sign. Coordination and gait normal. GCS eye subscore is 4. GCS verbal subscore is 5. GCS motor subscore is 6.  Heel to toe normal. No dysmetria.  Skin: Skin is warm and dry. No rash noted.  Psychiatric: She has a normal mood and affect. Her behavior is normal. Judgment and thought content normal.  Nursing note and vitals reviewed.   ED Course  Procedures (including critical care time)  Labs Review Labs Reviewed - No data to display  Imaging Review Dg Chest 2 View  06/18/2015  CLINICAL DATA:  Hypertension for 1 week. Headaches, dizziness, nausea, lightheadedness, shortness of breath, numbness in both feet and left arm, palpitations. Flu-like symptoms for a few weeks. EXAM: CHEST  2 VIEW COMPARISON:  05/04/2014 FINDINGS: The heart size and mediastinal contours are within normal limits. Both lungs are clear. The visualized skeletal structures are unremarkable. IMPRESSION: No active cardiopulmonary disease. Electronically Signed   By: Burman NievesWilliam  Stevens M.D.   On: 06/18/2015 22:33     Visual Acuity Review  Right Eye Distance:   Left Eye Distance:   Bilateral Distance:    Right Eye Near:   Left Eye Near:    Bilateral Near:         MDM   1. Headache, frequent episodic tension-type   2. Essential hypertension     Toradol 60 mg IM, Reglan 10 mg IM, Benadryl 50 mg IM and Zofran 4 mg by mouth. Patient is follow-up PCP or obtain one as an is possible for follow-up of recurrent tension-type headaches and management of hypertension. For worsening new symptom problems go to the emergency department. Naprosyn and zofran Rx At time of discharge patient states she still has her headache. No new symptoms. Her nausea is better.   Hayden Rasmussenavid Shavaun Osterloh, NP 06/19/15 2106

## 2015-06-21 ENCOUNTER — Emergency Department (HOSPITAL_COMMUNITY): Payer: Self-pay

## 2015-06-21 ENCOUNTER — Encounter (HOSPITAL_COMMUNITY): Payer: Self-pay | Admitting: Emergency Medicine

## 2015-06-21 ENCOUNTER — Emergency Department (HOSPITAL_COMMUNITY)
Admission: EM | Admit: 2015-06-21 | Discharge: 2015-06-21 | Disposition: A | Discharge status: Home / Self Care | Payer: Self-pay | Attending: Emergency Medicine | Admitting: Emergency Medicine

## 2015-06-21 DIAGNOSIS — H53149 Visual discomfort, unspecified: Secondary | ICD-10-CM | POA: Insufficient documentation

## 2015-06-21 DIAGNOSIS — R51 Headache: Secondary | ICD-10-CM | POA: Insufficient documentation

## 2015-06-21 DIAGNOSIS — R519 Headache, unspecified: Secondary | ICD-10-CM

## 2015-06-21 DIAGNOSIS — Z87442 Personal history of urinary calculi: Secondary | ICD-10-CM | POA: Insufficient documentation

## 2015-06-21 DIAGNOSIS — Z8719 Personal history of other diseases of the digestive system: Secondary | ICD-10-CM | POA: Insufficient documentation

## 2015-06-21 DIAGNOSIS — Z8679 Personal history of other diseases of the circulatory system: Secondary | ICD-10-CM | POA: Insufficient documentation

## 2015-06-21 LAB — CBC WITH DIFFERENTIAL/PLATELET
BASOS PCT: 0 %
Basophils Absolute: 0 10*3/uL (ref 0.0–0.1)
EOS ABS: 0.1 10*3/uL (ref 0.0–0.7)
Eosinophils Relative: 2 %
HCT: 37.9 % (ref 36.0–46.0)
HEMOGLOBIN: 11.6 g/dL — AB (ref 12.0–15.0)
Lymphocytes Relative: 25 %
Lymphs Abs: 1.5 10*3/uL (ref 0.7–4.0)
MCH: 26.1 pg (ref 26.0–34.0)
MCHC: 30.6 g/dL (ref 30.0–36.0)
MCV: 85.4 fL (ref 78.0–100.0)
Monocytes Absolute: 0.5 10*3/uL (ref 0.1–1.0)
Monocytes Relative: 9 %
NEUTROS PCT: 64 %
Neutro Abs: 3.7 10*3/uL (ref 1.7–7.7)
Platelets: 259 10*3/uL (ref 150–400)
RBC: 4.44 MIL/uL (ref 3.87–5.11)
RDW: 13.9 % (ref 11.5–15.5)
WBC: 5.8 10*3/uL (ref 4.0–10.5)

## 2015-06-21 LAB — CARBOXYHEMOGLOBIN
Carboxyhemoglobin: 1.3 % (ref 0.5–1.5)
METHEMOGLOBIN: 0.7 % (ref 0.0–1.5)
O2 Saturation: 78 %
Total hemoglobin: 11.6 g/dL — ABNORMAL LOW (ref 12.0–16.0)

## 2015-06-21 LAB — BASIC METABOLIC PANEL
ANION GAP: 9 (ref 5–15)
BUN: 11 mg/dL (ref 6–20)
CALCIUM: 9.2 mg/dL (ref 8.9–10.3)
CO2: 25 mmol/L (ref 22–32)
CREATININE: 0.8 mg/dL (ref 0.44–1.00)
Chloride: 108 mmol/L (ref 101–111)
Glucose, Bld: 106 mg/dL — ABNORMAL HIGH (ref 65–99)
Potassium: 4.2 mmol/L (ref 3.5–5.1)
SODIUM: 142 mmol/L (ref 135–145)

## 2015-06-21 LAB — I-STAT TROPONIN, ED: Troponin i, poc: 0.01 ng/mL (ref 0.00–0.08)

## 2015-06-21 MED ORDER — PROCHLORPERAZINE EDISYLATE 5 MG/ML IJ SOLN
10.0000 mg | Freq: Four times a day (QID) | INTRAMUSCULAR | Status: DC | PRN
  Administered 2015-06-21: 10 mg via INTRAVENOUS
  Filled 2015-06-21: qty 2

## 2015-06-21 MED ORDER — KETOROLAC TROMETHAMINE 30 MG/ML IJ SOLN
30.0000 mg | Freq: Once | INTRAMUSCULAR | Status: AC
  Administered 2015-06-21: 30 mg via INTRAVENOUS
  Filled 2015-06-21: qty 1

## 2015-06-21 MED ORDER — DIPHENHYDRAMINE HCL 50 MG/ML IJ SOLN
25.0000 mg | Freq: Once | INTRAMUSCULAR | Status: AC
  Administered 2015-06-21: 25 mg via INTRAVENOUS
  Filled 2015-06-21: qty 1

## 2015-06-21 MED ORDER — DEXAMETHASONE SODIUM PHOSPHATE 10 MG/ML IJ SOLN
10.0000 mg | Freq: Once | INTRAMUSCULAR | Status: AC
  Administered 2015-06-21: 10 mg via INTRAVENOUS
  Filled 2015-06-21: qty 1

## 2015-06-21 NOTE — ED Notes (Signed)
In EMS from home for HA past 4 days, woke pt up from sleep, reports some nausea. Hx migraines.

## 2015-06-21 NOTE — ED Notes (Signed)
Wheelchair outside of door for patient's discharge; Jesse SansJanee', RN aware and present in room with patient

## 2015-06-21 NOTE — ED Notes (Signed)
Patient transported to CT 

## 2015-06-21 NOTE — Discharge Instructions (Signed)
Take naproxen or Excedrin Migraine for the headache. Make sure to get plenty of rest. Drink plenty of fluids. Lab work, CT of the head, MRI of the brain were all unremarkable. Please follow-up with the primary care doctor for recheck of further evaluation.   General Headache Without Cause A headache is pain or discomfort felt around the head or neck area. There are many causes and types of headaches. In some cases, the cause may not be found.  HOME CARE  Managing Pain  Take over-the-counter and prescription medicines only as told by your doctor.  Lie down in a dark, quiet room when you have a headache.  If directed, apply ice to the head and neck area:  Put ice in a plastic bag.  Place a towel between your skin and the bag.  Leave the ice on for 20 minutes, 2-3 times per day.  Use a heating pad or hot shower to apply heat to the head and neck area as told by your doctor.  Keep lights dim if bright lights bother you or make your headaches worse. Eating and Drinking  Eat meals on a regular schedule.  Lessen how much alcohol you drink.  Lessen how much caffeine you drink, or stop drinking caffeine. General Instructions  Keep all follow-up visits as told by your doctor. This is important.  Keep a journal to find out if certain things bring on headaches. For example, write down:  What you eat and drink.  How much sleep you get.  Any change to your diet or medicines.  Relax by getting a massage or doing other relaxing activities.  Lessen stress.  Sit up straight. Do not tighten (tense) your muscles.  Do not use tobacco products. This includes cigarettes, chewing tobacco, or e-cigarettes. If you need help quitting, ask your doctor.  Exercise regularly as told by your doctor.  Get enough sleep. This often means 7-9 hours of sleep. GET HELP IF:  Your symptoms are not helped by medicine.  You have a headache that feels different than the other headaches.  You feel  sick to your stomach (nauseous) or you throw up (vomit).  You have a fever. GET HELP RIGHT AWAY IF:   Your headache becomes really bad.  You keep throwing up.  You have a stiff neck.  You have trouble seeing.  You have trouble speaking.  You have pain in the eye or ear.  Your muscles are weak or you lose muscle control.  You lose your balance or have trouble walking.  You feel like you will pass out (faint) or you pass out.  You have confusion.   This information is not intended to replace advice given to you by your health care provider. Make sure you discuss any questions you have with your health care provider.   Document Released: 04/18/2008 Document Revised: 03/31/2015 Document Reviewed: 11/02/2014 Elsevier Interactive Patient Education [employer]2016 [employer] Inc.

## 2015-06-21 NOTE — ED Notes (Signed)
Patient has returned from being out of the department; patient placed back on continuous pulse oximetry and blood pressure cuff; visitor at bedside 

## 2015-06-21 NOTE — ED Notes (Signed)
Patient transported to MRI 

## 2015-06-21 NOTE — ED Provider Notes (Signed)
CSN: [id number]     Arrival date & time 06/21/15  [id number] History   First MD Initiated Contact with Patient 06/21/15 [phone]     Chief Complaint  Patient presents with  . Headache     (Consider location/radiation/quality/duration/timing/severity/associated sxs/prior Treatment) HPI Monique Tyler is a 49 y.o. female with history of migraine headaches, acid reflux, kidney stones, presents to emergency department complaining of a headache. Patient states she has had a headache for the last 4 days. She states however this morning she woke up from sleep with forced headache. Pain is "all over my head." Reports associated dizziness, nausea, also reports numbness or weakness to the left arm and leg that she woke up with. She states she has been to emergency department multiple times recently for similar headaches and elevated blood pressure. She does not have a diagnosis of high blood pressure. She states this morning when EMS arrived at her house and took her blood pressure was 210 systolic. She does not take any medications except for naproxen which she took this morning when she felt her headache was getting worse. She actually has a follow-up with a primary care doctor today. Denies history of similar headaches in the past.   Past Medical History  Diagnosis Date  . Migraine   . GERD (gastroesophageal reflux disease)   . Lung nodules   . Kidney stones    Past Surgical History  Procedure Laterality Date  . Cesarean section    . Mandible surgery    . Tubal ligation     No family history on file. Social History  Substance Use Topics  . Smoking status: Never Smoker   . Smokeless tobacco: Never Used  . Alcohol Use: No   OB History    Gravida Para Term Preterm AB TAB SAB Ectopic Multiple Living   5 5       1       Review of Systems  Constitutional: Negative for fever and chills.  Eyes: Positive for photophobia.  Respiratory: Negative for cough, chest tightness and shortness of breath.    Cardiovascular: Negative for chest pain, palpitations and leg swelling.  Gastrointestinal: Negative for nausea, vomiting, abdominal pain and diarrhea.  Genitourinary: Negative for dysuria, flank pain, vaginal bleeding, vaginal discharge, vaginal pain and pelvic pain.  Musculoskeletal: Negative for myalgias, arthralgias, neck pain and neck stiffness.  Skin: Negative for rash.  Neurological: Positive for weakness, numbness and headaches. Negative for dizziness.  All other systems reviewed and are negative.     Allergies  Shellfish allergy and Pork-derived products  Home Medications   Prior to Admission medications   Medication Sig Start Date End Date Taking? Authorizing Provider  naproxen (NAPROSYN) 375 MG tablet Take 1 tablet (375 mg total) by mouth 2 (two) times daily. 06/19/15   Hayden Rasmussenavid Mabe, NP  ondansetron (ZOFRAN) 4 MG tablet Take 1 tablet (4 mg total) by mouth every 6 (six) hours. 06/19/15   Hayden Rasmussenavid Mabe, NP   BP 126/79 mmHg  Pulse 78  Resp 20  SpO2 96% Physical Exam  Constitutional: She is oriented to person, place, and time. She appears well-developed and well-nourished. No distress.  HENT:  Head: Normocephalic.  Right Ear: External ear normal.  Left Ear: External ear normal.  Nose: Nose normal.  Mouth/Throat: Oropharynx is clear and moist.  Eyes: Conjunctivae and EOM are normal. Pupils are equal, round, and reactive to light.  Neck: Neck supple.  Cardiovascular: Normal rate, regular rhythm and normal heart sounds.   Pulmonary/Chest: Effort  normal and breath sounds normal. No respiratory distress. She has no wheezes. She has no rales.  Abdominal: Soft. Bowel sounds are normal. She exhibits no distension. There is no tenderness. There is no rebound.  Musculoskeletal: She exhibits no edema.  Neurological: She is alert and oriented to person, place, and time. No cranial nerve deficit. Coordination normal.  5/5 and equal upper and lower extremity strength bilaterally. Equal  grip strength bilaterally. Normal finger to nose and heel to shin. No pronator drift.   Skin: Skin is warm and dry.  Psychiatric: She has a normal mood and affect. Her behavior is normal.  Nursing note and vitals reviewed.   ED Course  Procedures (including critical care time) Labs Review Labs Reviewed  CBC WITH DIFFERENTIAL/PLATELET - Abnormal; Notable for the following:    Hemoglobin 11.6 (*)    All other components within normal limits  BASIC METABOLIC PANEL - Abnormal; Notable for the following:    Glucose, Bld 106 (*)    All other components within normal limits  CARBOXYHEMOGLOBIN - Abnormal; Notable for the following:    Total hemoglobin 11.6 (*)    All other components within normal limits  I-STAT TROPOININ, ED    Imaging Review Ct Head Wo Contrast  06/21/2015  CLINICAL DATA:  Woke up this morning with a headache as well as left arm/ leg weakness. History of migraines. EXAM: CT HEAD WITHOUT CONTRAST TECHNIQUE: Contiguous axial images were obtained from the base of the skull through the vertex without intravenous contrast. COMPARISON:  01/05/2013. FINDINGS: No evidence of an acute infarct, acute hemorrhage, mass lesion, mass effect or hydrocephalus. Visualized portions of the paranasal sinuses and mastoid air cells are clear. IMPRESSION: Negative. Electronically Signed   By: Leanna Battles M.D.   On: 06/21/2015 07:34   Mr Brain Wo Contrast  06/21/2015  CLINICAL DATA:  Nausea and dizziness. Bilateral temporal and occipital headaches. Posterior neck muscle tenderness. Hypertension. EXAM: MRI HEAD WITHOUT CONTRAST TECHNIQUE: Multiplanar, multiecho pulse sequences of the brain and surrounding structures were obtained without intravenous contrast. COMPARISON:  CT head without contrast from the same day. FINDINGS: No acute infarct, hemorrhage, or mass lesion is present. No significant white matter disease is present. The ventricles are of normal size. No significant extraaxial fluid  collection is present. Flow was present in the major intracranial arteries. The internal auditory canals are within normal limits. The brainstem and cerebellum are within normal limits. The globes orbits are unremarkable. The paranasal sinuses and mastoid air cells are clear. The skullbase is within normal limits. Midline sagittal images are within normal limits. IMPRESSION: Negative MRI of the brain. Electronically Signed   By: Marin Roberts M.D.   On: 06/21/2015 09:48   I have personally reviewed and evaluated these images and lab results as part of my medical decision-making.   EKG Interpretation   Date/Time:  Monday June 21 2015 05:29:59 EST Ventricular Rate:  86 PR Interval:  169 QRS Duration: 86 QT Interval:  385 QTC Calculation: 460 R Axis:   47 Text Interpretation:  Sinus rhythm Abnormal R-wave progression, early  transition since last tracing no significant change Confirmed by MILLER   MD, BRIAN ([id number]) on 06/21/2015 8:24:40 AM      MDM   Final diagnoses:  Nonintractable headache, unspecified chronicity pattern, unspecified headache type    Pt with headache for 4 days, worse this morning. Woke her up from sleep. Reports nausea, left sided weakness. Pt does have left sided weakness on exam. VS normal.  She is in no distress. Last seen normal yesterday. Labs, CT head ordered.   8:28 AM CT negative. Headache not improved with compazine. Will add toradol. Discussed with Dr. Hyacinth Meeker, will get MR brain to rule out cva.   10:47 AM Patient feels better after Toradol and Compazine. Her MRI is negative. Patient's strength seemed to be improved on examination now. Question whether this could be a complex migraine. Plan to discharge home, continue ibuprofen or Excedrin Migraine for the headaches. Rest. Drink plenty of fluids. Follow-up with primary care doctor.  Filed Vitals:   06/21/15 0830 06/21/15 0948 06/21/15 1100 06/21/15 1115  BP: 135/89 120/75 109/74 124/70  Pulse:  78 68 65 64  Resp: 19 15    SpO2: 94% 98% 93% 95%     Jaynie Crumble, PA-C 06/23/15 0250  Loren Racer, MD 06/29/15 [phone]

## 2015-06-30 ENCOUNTER — Encounter (HOSPITAL_COMMUNITY): Payer: Self-pay | Admitting: Emergency Medicine

## 2015-06-30 ENCOUNTER — Emergency Department (INDEPENDENT_AMBULATORY_CARE_PROVIDER_SITE_OTHER)
Admission: EM | Admit: 2015-06-30 | Discharge: 2015-06-30 | Disposition: A | Discharge status: Home / Self Care | Payer: Self-pay | Source: Home / Self Care | Attending: Emergency Medicine | Admitting: Emergency Medicine

## 2015-06-30 DIAGNOSIS — G44209 Tension-type headache, unspecified, not intractable: Secondary | ICD-10-CM

## 2015-06-30 MED ORDER — KETOROLAC TROMETHAMINE 60 MG/2ML IM SOLN
INTRAMUSCULAR | Status: AC
  Filled 2015-06-30: qty 2

## 2015-06-30 MED ORDER — KETOROLAC TROMETHAMINE 60 MG/2ML IM SOLN
60.0000 mg | Freq: Once | INTRAMUSCULAR | Status: AC
  Administered 2015-06-30: 60 mg via INTRAMUSCULAR

## 2015-06-30 MED ORDER — NAPROXEN 375 MG PO TABS: 375.0000 mg | ORAL_TABLET | Freq: Two times a day (BID) | ORAL | Status: DC

## 2015-06-30 MED ORDER — CYCLOBENZAPRINE HCL 5 MG PO TABS: 5.0000 mg | ORAL_TABLET | Freq: Every evening | ORAL | Status: DC | PRN

## 2015-06-30 NOTE — Discharge Instructions (Signed)
You have a tension-type headache. We gave you a medicine here to help. You can take Naprosyn twice a day as needed for pain. Try the Flexeril at bedtime to help with any muscle tension that may be contributing. This medicine will make you sleepy. Follow-up as needed.

## 2015-06-30 NOTE — ED Notes (Signed)
Pt here with c/o constant migraine headache that started last week No n/v associated with sx's States she was recently discharged from her provider due to non payment  BP stable

## 2015-06-30 NOTE — ED Provider Notes (Signed)
CSN: [id number]     Arrival date & time 06/30/15  1406 History   First MD Initiated Contact with Patient 06/30/15 1448     Chief Complaint  Patient presents with  . Migraine   (Consider location/radiation/quality/duration/timing/severity/associated sxs/prior Treatment) HPI She is a 49 year old woman here for evaluation of headache. She states over the last 2 and half weeks she has been having frequent headaches. She has a history of migraine headaches that were controlled on Topamax. She states her PCP put her back on Topamax approximately 10 days ago. She has been seen in the emergency room a little over a week ago for headache. At that time she had both a head CT and a brain MRI that were normal. Today, she complains of a pressure sensation across the front of her head that water falls down the side of her face. It has been constant for the last 4-5 days. It will temporarily improve with Naprosyn. She denies any photophobia or phonophobia. No nausea or vomiting. No focal numbness, tingling, weakness. She states this is different from her typical migraine.  Past Medical History  Diagnosis Date  . Migraine   . GERD (gastroesophageal reflux disease)   . Lung nodules   . Kidney stones    Past Surgical History  Procedure Laterality Date  . Cesarean section    . Mandible surgery    . Tubal ligation     No family history on file. Social History  Substance Use Topics  . Smoking status: Never Smoker   . Smokeless tobacco: Never Used  . Alcohol Use: No   OB History    Gravida Para Term Preterm AB TAB SAB Ectopic Multiple Living   5 5       1       Review of Systems As in history of present illness Allergies  Shellfish allergy and Pork-derived products  Home Medications   Prior to Admission medications   Medication Sig Start Date End Date Taking? Authorizing Provider  cyclobenzaprine (FLEXERIL) 5 MG tablet Take 1 tablet (5 mg total) by mouth at bedtime as needed (headache). 06/30/15    Charm RingsErin J Latifah Padin, MD  naproxen (NAPROSYN) 375 MG tablet Take 1 tablet (375 mg total) by mouth 2 (two) times daily. 06/30/15   Charm RingsErin J Pio Eatherly, MD  ondansetron (ZOFRAN) 4 MG tablet Take 1 tablet (4 mg total) by mouth every 6 (six) hours. 06/19/15   Hayden Rasmussenavid Mabe, NP   Meds Ordered and Administered this Visit   Medications  ketorolac (TORADOL) injection 60 mg (not administered)    BP 137/70 mmHg  Pulse 79  Temp(Src) 98 F (36.7 C) (Oral)  SpO2 100% No data found.   Physical Exam  Constitutional: She appears well-developed and well-nourished. No distress.  Eyes: EOM are normal. Pupils are equal, round, and reactive to light.  Neck: Neck supple.  Cardiovascular: Normal rate, regular rhythm and normal heart sounds.   No murmur heard. Pulmonary/Chest: Effort normal and breath sounds normal. No respiratory distress. She has no wheezes. She has no rales.  Neurological: She is alert. No cranial nerve deficit. She exhibits normal muscle tone. Coordination normal.    ED Course  Procedures (including critical care time)  Labs Review Labs Reviewed - No data to display  Imaging Review No results found.     MDM   1. Tension headache    Description of headache is more consistent with tension-type headache. Toradol given here. Prescription given for Naprosyn and Flexeril. Follow-up as needed.  Charm Rings, MD 06/30/15 [phone]

## 2015-10-20 ENCOUNTER — Emergency Department (HOSPITAL_COMMUNITY)
Admission: EM | Admit: 2015-10-20 | Discharge: 2015-10-21 | Disposition: A | Discharge status: Home / Self Care | Payer: Self-pay | Attending: Emergency Medicine | Admitting: Emergency Medicine

## 2015-10-20 ENCOUNTER — Encounter (HOSPITAL_COMMUNITY): Payer: Self-pay | Admitting: Emergency Medicine

## 2015-10-20 ENCOUNTER — Emergency Department (HOSPITAL_COMMUNITY): Payer: Self-pay

## 2015-10-20 DIAGNOSIS — G43909 Migraine, unspecified, not intractable, without status migrainosus: Secondary | ICD-10-CM | POA: Insufficient documentation

## 2015-10-20 DIAGNOSIS — Z3202 Encounter for pregnancy test, result negative: Secondary | ICD-10-CM | POA: Insufficient documentation

## 2015-10-20 DIAGNOSIS — Z8719 Personal history of other diseases of the digestive system: Secondary | ICD-10-CM | POA: Insufficient documentation

## 2015-10-20 DIAGNOSIS — R197 Diarrhea, unspecified: Secondary | ICD-10-CM | POA: Insufficient documentation

## 2015-10-20 DIAGNOSIS — Z9889 Other specified postprocedural states: Secondary | ICD-10-CM | POA: Insufficient documentation

## 2015-10-20 DIAGNOSIS — N200 Calculus of kidney: Secondary | ICD-10-CM | POA: Insufficient documentation

## 2015-10-20 DIAGNOSIS — Z792 Long term (current) use of antibiotics: Secondary | ICD-10-CM | POA: Insufficient documentation

## 2015-10-20 DIAGNOSIS — Z79899 Other long term (current) drug therapy: Secondary | ICD-10-CM | POA: Insufficient documentation

## 2015-10-20 LAB — PREGNANCY, URINE: Preg Test, Ur: NEGATIVE

## 2015-10-20 LAB — URINALYSIS, ROUTINE W REFLEX MICROSCOPIC
BILIRUBIN URINE: NEGATIVE
GLUCOSE, UA: NEGATIVE mg/dL
Ketones, ur: NEGATIVE mg/dL
Nitrite: NEGATIVE
Protein, ur: NEGATIVE mg/dL
SPECIFIC GRAVITY, URINE: 1.015 (ref 1.005–1.030)
pH: 6 (ref 5.0–8.0)

## 2015-10-20 LAB — BASIC METABOLIC PANEL
Anion gap: 8 (ref 5–15)
BUN: 10 mg/dL (ref 6–20)
CALCIUM: 9.6 mg/dL (ref 8.9–10.3)
CHLORIDE: 112 mmol/L — AB (ref 101–111)
CO2: 24 mmol/L (ref 22–32)
CREATININE: 1.02 mg/dL — AB (ref 0.44–1.00)
GFR calc non Af Amer: >60 mL/min (ref >60)
Glucose, Bld: 91 mg/dL (ref 65–99)
Potassium: 3.6 mmol/L (ref 3.5–5.1)
SODIUM: 144 mmol/L (ref 135–145)

## 2015-10-20 LAB — URINE MICROSCOPIC-ADD ON

## 2015-10-20 LAB — CBC WITH DIFFERENTIAL/PLATELET
BASOS PCT: 0 %
Basophils Absolute: 0 10*3/uL (ref 0.0–0.1)
EOS ABS: 0.2 10*3/uL (ref 0.0–0.7)
Eosinophils Relative: 2 %
HCT: 38.5 % (ref 36.0–46.0)
HEMOGLOBIN: 11.6 g/dL — AB (ref 12.0–15.0)
Lymphocytes Relative: 34 %
Lymphs Abs: 2.7 10*3/uL (ref 0.7–4.0)
MCH: 25.9 pg — ABNORMAL LOW (ref 26.0–34.0)
MCHC: 30.1 g/dL (ref 30.0–36.0)
MCV: 85.9 fL (ref 78.0–100.0)
MONOS PCT: 9 %
Monocytes Absolute: 0.7 10*3/uL (ref 0.1–1.0)
NEUTROS ABS: 4.4 10*3/uL (ref 1.7–7.7)
NEUTROS PCT: 55 %
Platelets: 325 10*3/uL (ref 150–400)
RBC: 4.48 MIL/uL (ref 3.87–5.11)
RDW: 13.8 % (ref 11.5–15.5)
WBC: 8 10*3/uL (ref 4.0–10.5)

## 2015-10-20 MED ORDER — HYDROMORPHONE HCL 1 MG/ML IJ SOLN
1.0000 mg | Freq: Once | INTRAMUSCULAR | Status: AC
  Administered 2015-10-20: 1 mg via INTRAVENOUS
  Filled 2015-10-20: qty 1

## 2015-10-20 MED ORDER — SODIUM CHLORIDE 0.9 % IV BOLUS (SEPSIS)
500.0000 mL | Freq: Once | INTRAVENOUS | Status: AC
  Administered 2015-10-20: 500 mL via INTRAVENOUS

## 2015-10-20 MED ORDER — OXYCODONE-ACETAMINOPHEN 5-325 MG PO TABS
ORAL_TABLET | ORAL | Status: AC
  Filled 2015-10-20: qty 1

## 2015-10-20 MED ORDER — OXYCODONE-ACETAMINOPHEN 5-325 MG PO TABS: 1.0000 | ORAL_TABLET | ORAL | Status: DC | PRN

## 2015-10-20 MED ORDER — ONDANSETRON HCL 4 MG/2ML IJ SOLN
4.0000 mg | Freq: Once | INTRAMUSCULAR | Status: AC
  Administered 2015-10-20: 4 mg via INTRAVENOUS
  Filled 2015-10-20: qty 2

## 2015-10-20 MED ORDER — OXYCODONE-ACETAMINOPHEN 5-325 MG PO TABS
2.0000 | ORAL_TABLET | Freq: Once | ORAL | Status: AC
  Administered 2015-10-20: 2 via ORAL
  Filled 2015-10-20: qty 2

## 2015-10-20 MED ORDER — OXYCODONE-ACETAMINOPHEN 5-325 MG PO TABS
1.0000 | ORAL_TABLET | ORAL | Status: DC | PRN
  Administered 2015-10-20: 1 via ORAL

## 2015-10-20 MED ORDER — DEXTROSE 5 % IV SOLN
1.0000 g | Freq: Once | INTRAVENOUS | Status: AC
  Administered 2015-10-20: 1 g via INTRAVENOUS
  Filled 2015-10-20: qty 10

## 2015-10-20 MED ORDER — ONDANSETRON 4 MG PO TBDP: 4.0000 mg | ORAL_TABLET | Freq: Three times a day (TID) | ORAL | Status: DC | PRN

## 2015-10-20 MED ORDER — KETOROLAC TROMETHAMINE 60 MG/2ML IM SOLN
60.0000 mg | Freq: Once | INTRAMUSCULAR | Status: AC
  Administered 2015-10-20: 60 mg via INTRAMUSCULAR
  Filled 2015-10-20: qty 2

## 2015-10-20 MED ORDER — MORPHINE SULFATE (PF) 4 MG/ML IV SOLN
4.0000 mg | Freq: Once | INTRAVENOUS | Status: AC
  Administered 2015-10-20: 4 mg via INTRAVENOUS
  Filled 2015-10-20: qty 1

## 2015-10-20 MED ORDER — SODIUM CHLORIDE 0.9 % IV BOLUS (SEPSIS)
1000.0000 mL | Freq: Once | INTRAVENOUS | Status: AC
  Administered 2015-10-20: 1000 mL via INTRAVENOUS

## 2015-10-20 NOTE — ED Provider Notes (Signed)
CSN: [id number]     Arrival date & time 10/20/15  1417 History   First MD Initiated Contact with Patient 10/20/15 1840     Chief Complaint  Patient presents with  . Flank Pain     (Consider location/radiation/quality/duration/timing/severity/associated sxs/prior Treatment) Patient is a 50 y.o. female presenting with flank pain. The history is provided by the patient. No language interpreter was used.  Flank Pain Associated symptoms include abdominal pain. Pertinent negatives include no chills, fever, nausea or vomiting.    Monique Tyler is a 50 y.o female with a history of migraines and kidney stones presents for sudden onset right flank and right groin pain that began 6 hours ago. Denies any treatment prior to arrival. States she was given a Percocet while in triage. States this feels exactly like her previous kidney stones. As not followed by a urologist due to insurance reasons. Says that the pain is sharp in nature. Also reports dysuria and hematuria. She is also complaining of several bouts of nonbloody diarrhea. Has had only 2 previous abdominal surgeries which were C-sections. Denies any fever, chills, urinary incontinence or retention, nausea, vomiting.   Past Medical History  Diagnosis Date  . Migraine   . GERD (gastroesophageal reflux disease)   . Lung nodules   . Kidney stones    Past Surgical History  Procedure Laterality Date  . Cesarean section    . Mandible surgery    . Tubal ligation     No family history on file. Social History  Substance Use Topics  . Smoking status: Never Smoker   . Smokeless tobacco: Never Used  . Alcohol Use: No   OB History    Gravida Para Term Preterm AB TAB SAB Ectopic Multiple Living   5 5       1       Review of Systems  Constitutional: Negative for fever and chills.  Gastrointestinal: Positive for abdominal pain. Negative for nausea, vomiting and blood in stool.  Genitourinary: Positive for flank pain.  All other systems  reviewed and are negative.     Allergies  Shellfish allergy and Pork-derived products  Home Medications   Prior to Admission medications   Medication Sig Start Date End Date Taking? Authorizing Provider  topiramate (TOPAMAX) 50 MG tablet Take 1 tablet by mouth 2 (two) times daily. 11/24/11  Yes Historical Provider, MD  cephALEXin (KEFLEX) 500 MG capsule Take 1 capsule (500 mg total) by mouth 4 (four) times daily. 10/21/15   Danelle BerryLeisa Tapia, PA-C  cyclobenzaprine (FLEXERIL) 5 MG tablet Take 1 tablet (5 mg total) by mouth at bedtime as needed (headache). 06/30/15   Charm RingsErin J Honig, MD  naproxen (NAPROSYN) 375 MG tablet Take 1 tablet (375 mg total) by mouth 2 (two) times daily. 06/30/15   Charm RingsErin J Honig, MD  ondansetron (ZOFRAN ODT) 4 MG disintegrating tablet Take 1 tablet (4 mg total) by mouth every 8 (eight) hours as needed for nausea or vomiting. 10/20/15   Danelle BerryLeisa Tapia, PA-C  ondansetron (ZOFRAN) 4 MG tablet Take 1 tablet (4 mg total) by mouth every 6 (six) hours. 06/19/15   Hayden Rasmussenavid Mabe, NP  oxyCODONE-acetaminophen (PERCOCET) 5-325 MG tablet Take 1 tablet by mouth every 4 (four) hours as needed. 10/20/15   Danelle BerryLeisa Tapia, PA-C   BP 154/84 mmHg  Pulse 59  Temp(Src) 98.4 F (36.9 C) (Oral)  Resp 18  SpO2 100% Physical Exam  Constitutional: She is oriented to person, place, and time. She appears well-developed and well-nourished. No distress.  HENT:  Head: Normocephalic and atraumatic.  Eyes: Conjunctivae are normal.  Neck: Normal range of motion. Neck supple.  Cardiovascular: Normal rate, regular rhythm and normal heart sounds.   Pulmonary/Chest: Effort normal and breath sounds normal.  Abdominal: Soft. She exhibits no distension. There is tenderness in the right lower quadrant. There is CVA tenderness. There is no rebound and no guarding.  Right-sided CVA tenderness. Right lower quadrant tenderness to palpation. No guarding or rebound. Obese abdomen. No distention.  Musculoskeletal: Normal range of  motion.  Neurological: She is alert and oriented to person, place, and time.  Skin: Skin is warm and dry.  Nursing note and vitals reviewed.   ED Course  Procedures (including critical care time) Labs Review Labs Reviewed  URINALYSIS, ROUTINE W REFLEX MICROSCOPIC (NOT AT [facility]) - Abnormal; Notable for the following:    APPearance HAZY (*)    Hgb urine dipstick SMALL (*)    Leukocytes, UA SMALL (*)    All other components within normal limits  URINE MICROSCOPIC-ADD ON - Abnormal; Notable for the following:    Squamous Epithelial / LPF 6-30 (*)    Bacteria, UA MANY (*)    All other components within normal limits  CBC WITH DIFFERENTIAL/PLATELET - Abnormal; Notable for the following:    Hemoglobin 11.6 (*)    MCH 25.9 (*)    All other components within normal limits  BASIC METABOLIC PANEL - Abnormal; Notable for the following:    Chloride 112 (*)    Creatinine, Ser 1.02 (*)    All other components within normal limits  URINE CULTURE  PREGNANCY, URINE    Imaging Review Ct Renal Stone Study  10/20/2015  CLINICAL DATA:  RIGHT flank pain beginning today, history kidney stones EXAM: CT ABDOMEN AND PELVIS WITHOUT CONTRAST TECHNIQUE: Multidetector CT imaging of the abdomen and pelvis was performed following the standard protocol without IV contrast. Sagittal and coronal MPR images reconstructed from axial data set. Oral contrast not administered for this indication. COMPARISON:  07/27/2014 FINDINGS: Minimal dependent atelectasis RIGHT lung base. Normal appearing kidneys without mass or hydronephrosis. Nondilated ureters but a 3 mm calculus is seen at or immediately lateral to the RIGHT ureterovesical junction. No additional urinary tract calcifications. Within limits of a nonenhanced exam, liver, gallbladder, spleen, pancreas, and adrenal glands normal. Tiny splenule anterior to spleen. Normal appendix. Stomach and bowel loops unremarkable for technique. Normal appearing uterus and adnexa. No  mass, adenopathy, free air, free fluid or inflammatory process. Numerous pelvic phleboliths. Osseous structures unremarkable. IMPRESSION: 3 mm calculus at RIGHT bladder base, either at or immediately lateral to the RIGHT ureterovesical junction. No hydronephrosis or hydroureter identified. Remainder of exam unremarkable. Electronically Signed   By: Ulyses Southward M.D.   On: 10/20/2015 22:01      EKG Interpretation None      MDM   Final diagnoses:  Right nephrolithiasis   Patient presents for right lower quadrant abdominal and flank pain 6 hours. Reports it feels exactly like her previous kidney stone pain. Last episode of kidney stones was in January 2016. Denies urology follow-up. UA has hgb and small leukocytes but no frank UTI.  CT renal study and labs pending. Patient is stable and pain under control.  I discussed this patient with Danelle Berry, PA-C who will follow patient. She will most likely need pain control and urology follow up.     Catha Gosselin, PA-C 10/21/15 0981  Benjiman Core, MD 10/21/15 [phone]

## 2015-10-20 NOTE — ED Provider Notes (Signed)
Monique Tyler  26-Oct-1965  Patient is a 50 year old female chief complaint of flank pain, given to me at shift change with CT renal pending.  She presented with chief complaint of sudden onset right flank pain with radiation towards her groin 2 days, pain 10 out of 10, given morphine and Dilaudid by Haynes Dage, prior to shift change.    Renal stone study results pertinent for a 3 mm stone at the ureteral vesicle junction, without hydroureter/hydronephrosis.  Patient continued to have 7 out of 10 pain, extending from right flank to her right groin.  She had a brief drop in her blood pressure after she was given Dilaudid, otherwise has been hemodynamically stable, tolerating PO's.  Labs significant for stable anemia, mild increase in serum creatinine, urinalysis pertinent for hematuria, small leukocytes, many bacteria and white blood cells 6-30.  Patient reports urinary frequency and frequency urgency and frequency with small amounts of urine.  Given a gram of Rocephin in the ER.  Imaging:   CT Renal Stone Study (Final result) Result time: 10/20/15 22:01:08   Final result by Rad Results In Interface (10/20/15 22:01:08)   Narrative:   CLINICAL DATA: RIGHT flank pain beginning today, history kidney stones  EXAM: CT ABDOMEN AND PELVIS WITHOUT CONTRAST  TECHNIQUE: Multidetector CT imaging of the abdomen and pelvis was performed following the standard protocol without IV contrast. Sagittal and coronal MPR images reconstructed from axial data set. Oral contrast not administered for this indication.  COMPARISON: 07/27/2014  FINDINGS: Minimal dependent atelectasis RIGHT lung base.  Normal appearing kidneys without mass or hydronephrosis.  Nondilated ureters but a 3 mm calculus is seen at or immediately lateral to the RIGHT ureterovesical junction.  No additional urinary tract calcifications.  Within limits of a nonenhanced exam, liver, gallbladder, spleen, pancreas, and  adrenal glands normal.  Tiny splenule anterior to spleen.  Normal appendix.  Stomach and bowel loops unremarkable for technique.  Normal appearing uterus and adnexa.  No mass, adenopathy, free air, free fluid or inflammatory process.  Numerous pelvic phleboliths.  Osseous structures unremarkable.  IMPRESSION: 3 mm calculus at RIGHT bladder base, either at or immediately lateral to the RIGHT ureterovesical junction.  No hydronephrosis or hydroureter identified.  Remainder of exam unremarkable.   Electronically Signed By: Ulyses Southward M.D. On: 10/20/2015 22:01     Results for orders placed or performed during the hospital encounter of 10/20/15  Urinalysis, Routine w reflex microscopic-may I&O cath if menses (not at [facility])  Result Value Ref Range   Color, Urine YELLOW YELLOW   APPearance HAZY (A) CLEAR   Specific Gravity, Urine 1.015 1.005 - 1.030   pH 6.0 5.0 - 8.0   Glucose, UA NEGATIVE NEGATIVE mg/dL   Hgb urine dipstick SMALL (A) NEGATIVE   Bilirubin Urine NEGATIVE NEGATIVE   Ketones, ur NEGATIVE NEGATIVE mg/dL   Protein, ur NEGATIVE NEGATIVE mg/dL   Nitrite NEGATIVE NEGATIVE   Leukocytes, UA SMALL (A) NEGATIVE  Urine microscopic-add on  Result Value Ref Range   Squamous Epithelial / LPF 6-30 (A) NONE SEEN   WBC, UA 6-30 0 - 5 WBC/hpf   RBC / HPF 0-5 0 - 5 RBC/hpf   Bacteria, UA MANY (A) NONE SEEN   Urine-Other MUCOUS PRESENT   Pregnancy, urine  Result Value Ref Range   Preg Test, Ur NEGATIVE NEGATIVE  CBC with Differential  Result Value Ref Range   WBC 8.0 4.0 - 10.5 K/uL   RBC 4.48 3.87 - 5.11 MIL/uL   Hemoglobin 11.6 (  L) 12.0 - 15.0 g/dL   HCT 16.138.5 09.636.0 - 04.546.0 %   MCV 85.9 78.0 - 100.0 fL   MCH 25.9 (L) 26.0 - 34.0 pg   MCHC 30.1 30.0 - 36.0 g/dL   RDW 40.913.8 81.111.5 - 91.415.5 %   Platelets 325 150 - 400 K/uL   Neutrophils Relative % 55 %   Neutro Abs 4.4 1.7 - 7.7 K/uL   Lymphocytes Relative 34 %   Lymphs Abs 2.7 0.7 - 4.0 K/uL   Monocytes Relative  9 %   Monocytes Absolute 0.7 0.1 - 1.0 K/uL   Eosinophils Relative 2 %   Eosinophils Absolute 0.2 0.0 - 0.7 K/uL   Basophils Relative 0 %   Basophils Absolute 0.0 0.0 - 0.1 K/uL  Basic metabolic panel  Result Value Ref Range   Sodium 144 135 - 145 mmol/L   Potassium 3.6 3.5 - 5.1 mmol/L   Chloride 112 (H) 101 - 111 mmol/L   CO2 24 22 - 32 mmol/L   Glucose, Bld 91 65 - 99 mg/dL   BUN 10 6 - 20 mg/dL   Creatinine, Ser 7.821.02 (H) 0.44 - 1.00 mg/dL   Calcium 9.6 8.9 - 95.610.3 mg/dL   GFR calc non Af Amer >60 >60 mL/min   GFR calc Af Amer >60 >60 mL/min   Anion gap 8 5 - 15   A/P:  Pt dx with right nephrolithiasis, 3mm non-obstructing stone located at the right ureterovesical junction. Pt hemodynamically stable, tolerating PO's. She was given toradol after CT results.  Pt also given 1g rocephin and percocet. She was instructed to follow up with Urology, however she states she cannot afford specialist follow up. Return precautions were reviewed.  She was d/c home with percocet, keflex, and zofran. VSS at discharge. Filed Vitals:   10/20/15 2215 10/20/15 2230 10/20/15 2300 10/21/15 0010  BP: 115/54 128/62 119/68 154/84  Pulse: 68 61 59   Temp:      TempSrc:      Resp:      SpO2: 99% 99% 100%         Danelle BerryLeisa Rodd Heft, PA-C 10/25/15 0255  Benjiman CoreNathan Pickering, MD 10/26/15 [phone]

## 2015-10-20 NOTE — ED Notes (Signed)
Patient's sister, Marjean Donnaileen called and [place]

## 2015-10-20 NOTE — Discharge Instructions (Signed)
Kidney Stones °Kidney stones (urolithiasis) are deposits that form inside your kidneys. The intense pain is caused by the stone moving through the urinary tract. When the stone moves, the ureter goes into spasm around the stone. The stone is usually passed in the urine.  °CAUSES  °· A disorder that makes certain neck glands produce too much parathyroid hormone (primary hyperparathyroidism). °· A buildup of uric acid crystals, similar to gout in your joints. °· Narrowing (stricture) of the ureter. °· A kidney obstruction present at birth (congenital obstruction). °· Previous surgery on the kidney or ureters. °· Numerous kidney infections. °SYMPTOMS  °· Feeling sick to your stomach (nauseous). °· Throwing up (vomiting). °· Blood in the urine (hematuria). °· Pain that usually spreads (radiates) to the groin. °· Frequency or urgency of urination. °DIAGNOSIS  °· Taking a history and physical exam. °· Blood or urine tests. °· CT scan. °· Occasionally, an examination of the inside of the urinary bladder (cystoscopy) is performed. °TREATMENT  °· Observation. °· Increasing your fluid intake. °· Extracorporeal shock wave lithotripsy--This is a noninvasive procedure that uses shock waves to break up kidney stones. °· Surgery may be needed if you have severe pain or persistent obstruction. There are various surgical procedures. Most of the procedures are performed with the use of small instruments. Only small incisions are needed to accommodate these instruments, so recovery time is minimized. °The size, location, and chemical composition are all important variables that will determine the proper choice of action for you. Talk to your health care provider to better understand your situation so that you will minimize the risk of injury to yourself and your kidney.  °HOME CARE INSTRUCTIONS  °· Drink enough water and fluids to keep your urine clear or pale yellow. This will help you to pass the stone or stone fragments. °· Strain  all urine through the provided strainer. Keep all particulate matter and stones for your health care provider to see. The stone causing the pain may be as small as a grain of salt. It is very important to use the strainer each and every time you pass your urine. The collection of your stone will allow your health care provider to analyze it and verify that a stone has actually passed. The stone analysis will often identify what you can do to reduce the incidence of recurrences. °· Only take over-the-counter or prescription medicines for pain, discomfort, or fever as directed by your health care provider. °· Keep all follow-up visits as told by your health care provider. This is important. °· Get follow-up X-rays if required. The absence of pain does not always mean that the stone has passed. It may have only stopped moving. If the urine remains completely obstructed, it can cause loss of kidney function or even complete destruction of the kidney. It is your responsibility to make sure X-rays and follow-ups are completed. Ultrasounds of the kidney can show blockages and the status of the kidney. Ultrasounds are not associated with any radiation and can be performed easily in a matter of minutes. °· Make changes to your daily diet as told by your health care provider. You may be told to: °¨ Limit the amount of salt that you eat. °¨ Eat 5 or more servings of fruits and vegetables each day. °¨ Limit the amount of meat, poultry, fish, and eggs that you eat. °· Collect a 24-hour urine sample as told by your health care provider. You may need to collect another urine sample every 6-12   months. °SEEK MEDICAL CARE IF: °· You experience pain that is progressive and unresponsive to any pain medicine you have been prescribed. °SEEK IMMEDIATE MEDICAL CARE IF:  °· Pain cannot be controlled with the prescribed medicine. °· You have a fever or shaking chills. °· The severity or intensity of pain increases over 18 hours and is not  relieved by pain medicine. °· You develop a new onset of abdominal pain. °· You feel faint or pass out. °· You are unable to urinate. °  °This information is not intended to replace advice given to you by your health care provider. Make sure you discuss any questions you have with your health care provider. °  °Document Released: 07/10/2005 Document Revised: 03/31/2015 Document Reviewed: 12/11/2012 °Elsevier Interactive Patient Education ©2016 [employer] Inc[employer] ° °

## 2015-10-20 NOTE — ED Notes (Signed)
Pt reports that she had a sudden onset of right flank pain radiating towards her groin x 2 days. Pt alert x4.

## 2015-10-20 NOTE — ED Notes (Signed)
Patient transported to CT 

## 2015-10-21 MED ORDER — CEPHALEXIN 500 MG PO CAPS: 500.0000 mg | ORAL_CAPSULE | Freq: Four times a day (QID) | ORAL | Status: DC

## 2015-10-21 NOTE — ED Notes (Signed)
Discussed with leisa, pa-c, patient ambulating in hallway. Returned to room, recheck bp after returned. bp is 154/84

## 2015-10-22 LAB — URINE CULTURE
Culture: >=100000
SPECIAL REQUESTS: NORMAL

## 2015-10-23 ENCOUNTER — Telehealth (HOSPITAL_BASED_OUTPATIENT_CLINIC_OR_DEPARTMENT_OTHER): Payer: Self-pay | Admitting: Emergency Medicine

## 2015-10-23 NOTE — Telephone Encounter (Signed)
Post ED Visit - Positive Culture Follow-up  Culture report reviewed by antimicrobial stewardship pharmacist:  []  Enzo BiNathan Batchelder, Pharm.D. []  Celedonio MiyamotoJeremy Frens, Pharm.D., BCPS []  Garvin FilaMike Maccia, Pharm.D. []  Georgina PillionElizabeth Martin, Pharm.D., BCPS []  ShepherdMinh Pham, [address]Ph[address]D., BCPS, AAHIVP []  Estella HuskMichelle Turner, Pharm.D., BCPS, AAHIVP []  Tennis Mustassie Stewart, Pharm.D. []  Rob Oswaldo DoneVincent, [address]Pharm[address]  Lysle Pearlachel Rumbarger PharmD  Positive urine culture Treated with cephalexin, organism sensitive to the same and no further patient follow-up is required at this time.  Berle MullMiller, Dhanya Bogle 10/23/2015, 2:52 PM

## 2015-11-29 ENCOUNTER — Ambulatory Visit (HOSPITAL_COMMUNITY)
Admission: EM | Admit: 2015-11-29 | Discharge: 2015-11-29 | Disposition: A | Discharge status: Home / Self Care | Payer: Self-pay | Attending: Family Medicine | Admitting: Family Medicine

## 2015-11-29 ENCOUNTER — Encounter (HOSPITAL_COMMUNITY): Payer: Self-pay | Admitting: *Deleted

## 2015-11-29 DIAGNOSIS — Z8669 Personal history of other diseases of the nervous system and sense organs: Secondary | ICD-10-CM

## 2015-11-29 DIAGNOSIS — H8309 Labyrinthitis, unspecified ear: Secondary | ICD-10-CM

## 2015-11-29 DIAGNOSIS — R42 Dizziness and giddiness: Secondary | ICD-10-CM

## 2015-11-29 MED ORDER — MECLIZINE HCL 25 MG PO TABS: 25.0000 mg | ORAL_TABLET | Freq: Three times a day (TID) | ORAL | Status: DC | PRN

## 2015-11-29 MED ORDER — TOPIRAMATE 50 MG PO TABS: 50.0000 mg | ORAL_TABLET | Freq: Two times a day (BID) | ORAL | Status: DC

## 2015-11-29 MED FILL — TOPIRAMATE 50 MG TABLET: 50 | 30 days supply | Qty: 60 | Fill #0

## 2015-11-29 MED FILL — ?MECLIZINE 25 MG TABLET: 25 | 7 days supply | Qty: 21 | Fill #0

## 2015-11-29 NOTE — Discharge Instructions (Signed)
Dizziness °Dizziness is a common problem. It makes you feel unsteady or lightheaded. You may feel like you are about to pass out (faint). Dizziness can lead to injury if you stumble or fall. Anyone can get dizzy, but dizziness is more common in older adults. This condition can be caused by a number of things, including: °· Medicines. °· Dehydration. °· Illness. °HOME CARE °Following these instructions may help with your condition: °Eating and Drinking °· Drink enough fluid to keep your pee (urine) clear or pale yellow. This helps to keep you from getting dehydrated. Try to drink more clear fluids, such as water. °· Do not drink alcohol. °· Limit how much caffeine you drink or eat if told by your doctor. °· Limit how much salt you drink or eat if told by your doctor. °Activity °· Avoid making quick movements. °¨ When you stand up from sitting in a chair, steady yourself until you feel okay. °¨ In the morning, first sit up on the side of the bed. When you feel okay, stand slowly while you hold onto something. Do this until you know that your balance is fine. °· Move your legs often if you need to stand in one place for a long time. Tighten and relax your muscles in your legs while you are standing. °· Do not drive or use heavy machinery if you feel dizzy. °· Avoid bending down if you feel dizzy. Place items in your home so that they are easy for you to reach without leaning over. °Lifestyle °· Do not use any tobacco products, including cigarettes, chewing tobacco, or electronic cigarettes. If you need help quitting, ask your doctor. °· Try to lower your stress level, such as with yoga or meditation. Talk with your doctor if you need help. °General Instructions °· Watch your dizziness for any changes. °· Take medicines only as told by your doctor. Talk with your doctor if you think that your dizziness is caused by a medicine that you are taking. °· Tell a friend or a family member that you are feeling dizzy. If he or  she notices any changes in your behavior, have this person call your doctor. °· Keep all follow-up visits as told by your doctor. This is important. °GET HELP IF: °· Your dizziness does not go away. °· Your dizziness or light-headedness gets worse. °· You feel sick to your stomach (nauseous). °· You have trouble hearing. °· You have new symptoms. °· You are unsteady on your feet or you feel like the room is spinning. °GET HELP RIGHT AWAY IF: °· You throw up (vomit) or have diarrhea and are unable to eat or drink anything. °· You have trouble: °¨ Talking. °¨ Walking. °¨ Swallowing. °¨ Using your arms, hands, or legs. °· You feel generally weak. °· You are not thinking clearly or you have trouble forming sentences. It may take a friend or family member to notice this. °· You have: °¨ Chest pain. °¨ Pain in your belly (abdomen). °¨ Shortness of breath. °¨ Sweating. °· Your vision changes. °· You are bleeding. °· You have a headache. °· You have neck pain or a stiff neck. °· You have a fever. °  °This information is not intended to replace advice given to you by your health care provider. Make sure you discuss any questions you have with your health care provider. °  °Document Released: 06/29/2011 Document Revised: 11/24/2014 Document Reviewed: 07/06/2014 °Elsevier Interactive Patient Education ©2016 Elsevier Inc. ° °

## 2015-11-29 NOTE — ED Provider Notes (Signed)
CSN: [id number]     Arrival date & time 11/29/15  1258 History   None    No chief complaint on file.  (Consider location/radiation/quality/duration/timing/severity/associated sxs/prior Treatment) Patient is a 50 y.o. female presenting with dizziness. The history is provided by the patient.  Dizziness Quality:  Head spinning and imbalance Severity:  Moderate Onset quality:  Sudden Duration:  1 day Timing:  Constant Progression:  Unchanged Chronicity:  New Context: bending over   Relieved by:  Nothing Worsened by:  Nothing Associated symptoms: headaches     Past Medical History  Diagnosis Date  . Migraine   . GERD (gastroesophageal reflux disease)   . Lung nodules   . Kidney stones    Past Surgical History  Procedure Laterality Date  . Cesarean section    . Mandible surgery    . Tubal ligation     No family history on file. Social History  Substance Use Topics  . Smoking status: Never Smoker   . Smokeless tobacco: Never Used  . Alcohol Use: No   OB History    Gravida Para Term Preterm AB TAB SAB Ectopic Multiple Living   Review of Systems  Constitutional: Negative.   Eyes: Negative.   Respiratory: Negative.   Cardiovascular: Negative.   Gastrointestinal: Negative.   Endocrine: Negative.   Genitourinary: Negative.   Musculoskeletal: Negative.   Skin: Negative.   Allergic/Immunologic: Negative.   Neurological: Positive for dizziness and headaches.  Hematological: Negative.   Psychiatric/Behavioral: Negative.     Allergies  Shellfish allergy and Pork-derived products  Home Medications   Prior to Admission medications   Medication Sig Start Date End Date Taking? Authorizing Provider  cephALEXin (KEFLEX) 500 MG capsule Take 1 capsule (500 mg total) by mouth 4 (four) times daily. 10/21/15   Danelle Berry, PA-C  cyclobenzaprine (FLEXERIL) 5 MG tablet Take 1 tablet (5 mg total) by mouth at bedtime as needed (headache). 06/30/15   Charm Rings,  MD  naproxen (NAPROSYN) 375 MG tablet Take 1 tablet (375 mg total) by mouth 2 (two) times daily. 06/30/15   Charm Rings, MD  ondansetron (ZOFRAN ODT) 4 MG disintegrating tablet Take 1 tablet (4 mg total) by mouth every 8 (eight) hours as needed for nausea or vomiting. 10/20/15   Danelle Berry, PA-C  ondansetron (ZOFRAN) 4 MG tablet Take 1 tablet (4 mg total) by mouth every 6 (six) hours. 06/19/15   Hayden Rasmussen, NP  oxyCODONE-acetaminophen (PERCOCET) 5-325 MG tablet Take 1 tablet by mouth every 4 (four) hours as needed. 10/20/15   Danelle Berry, PA-C  topiramate (TOPAMAX) 50 MG tablet Take 1 tablet by mouth 2 (two) times daily. 11/24/11   Historical Provider, MD   Meds Ordered and Administered this Visit  Medications - No data to display  BP 151/87 mmHg  Pulse 86  Temp(Src) 97.8 F (36.6 C) (Oral)  Resp 18  SpO2 97% No data found.   Physical Exam  Constitutional: She appears well-developed and well-nourished.  HENT:  Head: Normocephalic and atraumatic.  Mouth/Throat: Oropharynx is clear and moist.  Eyes: Conjunctivae are normal. Pupils are equal, round, and reactive to light.  Patient has nystagmus and cannot continue EOM exam due to vertigo symptoms.  Neck: Normal range of motion.  Cardiovascular: Normal rate, regular rhythm and normal heart sounds.   Pulmonary/Chest: Effort normal and breath sounds normal.  Abdominal: Soft. Bowel sounds are normal.  ED Course  Procedures (including critical care time)  Labs Review Labs Reviewed - No data to display  Imaging Review No results found.   Visual Acuity Review  Right Eye Distance:   Left Eye Distance:   Bilateral Distance:    Right Eye Near:   Left Eye Near:    Bilateral Near:         MDM   Hx of Migraine Headaches - Refill topamax 50mg  po bid #60.  She actually is not having a headache today but needs refills on topamax.  She states she lost her insurance and she needs another PCP. [facility] and will bridge with topamax until seen by pcp.  Labyrinthitis - Antivert 25mg  one po tid prn dizziness #21.  Push po fluid rest and follow up prn. Discussed doing vertigo exercises.     Deatra CanterWilliam J Colman Birdwell, FNP 11/29/15 [phone]

## 2015-11-29 NOTE — ED Notes (Signed)
Pt  Reports   symptoms    Of  Headache     With  Vertigo      X     Several  Days        Pt  Reports  Has  Vertigo in past         she  Is  Sitting  Upright on  Exam table  Speaking in  Complete  sentances  And  Is  inno  Acute  Severe  Distress

## 2015-11-30 ENCOUNTER — Encounter: Payer: Self-pay | Admitting: Family Medicine

## 2015-11-30 ENCOUNTER — Ambulatory Visit: Payer: Self-pay | Attending: Family Medicine | Admitting: Family Medicine

## 2015-11-30 VITALS — BP 118/81 | HR 83 | Ht 67.0 in | Wt 257.2 lb

## 2015-11-30 DIAGNOSIS — Z79899 Other long term (current) drug therapy: Secondary | ICD-10-CM | POA: Insufficient documentation

## 2015-11-30 DIAGNOSIS — Z888 Allergy status to other drugs, medicaments and biological substances status: Secondary | ICD-10-CM | POA: Insufficient documentation

## 2015-11-30 DIAGNOSIS — R918 Other nonspecific abnormal finding of lung field: Secondary | ICD-10-CM | POA: Insufficient documentation

## 2015-11-30 DIAGNOSIS — G43809 Other migraine, not intractable, without status migrainosus: Secondary | ICD-10-CM

## 2015-11-30 DIAGNOSIS — H8110 Benign paroxysmal vertigo, unspecified ear: Secondary | ICD-10-CM | POA: Insufficient documentation

## 2015-11-30 DIAGNOSIS — G43909 Migraine, unspecified, not intractable, without status migrainosus: Secondary | ICD-10-CM | POA: Insufficient documentation

## 2015-11-30 DIAGNOSIS — H8113 Benign paroxysmal vertigo, bilateral: Secondary | ICD-10-CM

## 2015-11-30 DIAGNOSIS — K219 Gastro-esophageal reflux disease without esophagitis: Secondary | ICD-10-CM | POA: Insufficient documentation

## 2015-11-30 DIAGNOSIS — R42 Dizziness and giddiness: Secondary | ICD-10-CM | POA: Insufficient documentation

## 2015-11-30 NOTE — Progress Notes (Signed)
CC: Here to establish care  HPI: Monique Tyler is a 50 y.o. female who was previously followed at the family [facility] clinic until she lost her insurance and comes into the clinic to establish care. Medical history is significant for migraine for which she remains on Topamax but still has occasional migraines. She was recently seen at the ED for dizziness which was diagnosed as labyrinthitis and she was placed on meclizine which she just picked up from the pharmacy.  She informs me that dizziness has been on for the last 1 month and is associated with change in position. Denies tinnitus or hearing loss or nausea but does have occasional sinus pressure.  Patient has No chest pain, No abdominal pain - No Nausea, No new weakness tingling or numbness, No Cough - SOB.  Allergies  Allergen Reactions  . Shellfish Allergy Swelling  . Pork-Derived Products Nausea And Vomiting   Past Medical History  Diagnosis Date  . Migraine   . GERD (gastroesophageal reflux disease)   . Lung nodules   . Kidney stones    Current Outpatient Prescriptions on File Prior to Visit  Medication Sig Dispense Refill  . cyclobenzaprine (FLEXERIL) 5 MG tablet Take 1 tablet (5 mg total) by mouth at bedtime as needed (headache). 30 tablet 0  . meclizine (ANTIVERT) 25 MG tablet Take 1 tablet (25 mg total) by mouth 3 (three) times daily as needed for dizziness. 21 tablet 0  . naproxen (NAPROSYN) 375 MG tablet Take 1 tablet (375 mg total) by mouth 2 (two) times daily. 60 tablet 0  . ondansetron (ZOFRAN ODT) 4 MG disintegrating tablet Take 1 tablet (4 mg total) by mouth every 8 (eight) hours as needed for nausea or vomiting. 10 tablet 0  . oxyCODONE-acetaminophen (PERCOCET) 5-325 MG tablet Take 1 tablet by mouth every 4 (four) hours as needed. 20 tablet 0  . topiramate (TOPAMAX) 50 MG tablet Take 1 tablet by mouth 2 (two) times daily.    Marland Kitchen. topiramate (TOPAMAX) 50 MG tablet Take 1 tablet (50 mg total) by mouth 2 (two) times  daily. 60 tablet 0   No current facility-administered medications on file prior to visit.   History reviewed. No pertinent family history. Social History   Social History  . Marital Status: Married    Spouse Name: N/A  . Number of Children: N/A  . Years of Education: N/A   Occupational History  . Not on file.   Social History Main Topics  . Smoking status: Never Smoker   . Smokeless tobacco: Never Used  . Alcohol Use: No  . Drug Use: No  . Sexual Activity: Yes    Birth Control/ Protection: Surgical   Other Topics Concern  . Not on file   Social History Narrative    Review of Systems: Constitutional: Negative for fever, chills, diaphoresis, activity change, appetite change and fatigue. HENT: Negative for ear pain, nosebleeds, congestion, facial swelling, rhinorrhea, neck pain, neck stiffness and ear discharge, positive for dizziness Eyes: Negative for pain, discharge, redness, itching and visual disturbance. Respiratory: Negative for cough, choking, chest tightness, shortness of breath, wheezing and stridor.  Cardiovascular: Negative for chest pain, palpitations and leg swelling. Gastrointestinal: Negative for abdominal distention. Genitourinary: Negative for dysuria, urgency, frequency, hematuria, flank pain, decreased urine volume, difficulty urinating and dyspareunia.  Musculoskeletal: Negative for back pain, joint swelling, arthralgias and gait problem. Neurological: Negative for dizziness, tremors, seizures, syncope, facial asymmetry, speech difficulty, weakness, light-headedness, numbness and headaches.  Hematological: Negative for adenopathy.  Does not bruise/bleed easily. Psychiatric/Behavioral: Negative for hallucinations, behavioral problems, confusion, dysphoric mood, decreased concentration and agitation.    Objective:   Filed Vitals:   11/30/15 1102 11/30/15 1103  BP: 116/77 118/81  Pulse: 77 83    Physical Exam: Constitutional: Patient appears  well-developed and well-nourished. No distress. HENT: Normocephalic, atraumatic, External right and left ear normal. Oropharynx is clear and moist, Left maxillary sinus tenderness Eyes: Conjunctivae and EOM are normal. PERRLA, no scleral icterus. Neck: Normal ROM. Neck supple. No JVD. No tracheal deviation. No thyromegaly. CVS: RRR, S1/S2 +, no murmurs, no gallops, no carotid bruit.  Pulmonary: Effort and breath sounds normal, no stridor, rhonchi, wheezes, rales.  Abdominal: Soft. BS +,  no distension, tenderness, rebound or guarding.  Musculoskeletal: Normal range of motion. No edema and no tenderness.  Lymphadenopathy: No lymphadenopathy noted, cervical, inguinal or axillary Neuro: Alert. Normal reflexes, muscle tone coordination. No cranial nerve deficit. Skin: Skin is warm and dry. No rash noted. Not diaphoretic. No erythema. No pallor. Psychiatric: Normal mood and affect. Behavior, judgment, thought content normal.  Lab Results  Component Value Date   WBC 8.0 10/20/2015   HGB 11.6* 10/20/2015   HCT 38.5 10/20/2015   MCV 85.9 10/20/2015   PLT 325 10/20/2015   Lab Results  Component Value Date   CREATININE 1.02* 10/20/2015   BUN 10 10/20/2015   NA 144 10/20/2015   K 3.6 10/20/2015   CL 112* 10/20/2015   CO2 24 10/20/2015    Lab Results  Component Value Date   HGBA1C  12/09/2007    4.6 (NOTE)   The ADA recommends the following therapeutic goals for glycemic   control related to Hgb A1C measurement:   Goal of Therapy:   < 7.0% Hgb A1C   Action Suggested:  > 8.0% Hgb A1C   Ref:  Diabetes Care, 22, Suppl. 1, 1999   Lipid Panel     Component Value Date/Time   CHOL 160 05/03/2011 0519   TRIG 82 05/03/2011 0519   HDL 39* 05/03/2011 0519   CHOLHDL 4.1 05/03/2011 0519   VLDL 16 05/03/2011 0519   LDLCALC 105* 05/03/2011 0519       Assessment and plan:  Vertigo: Patient just picked up a prescription for meclizine. Advised to continue with the meclizine and I will reassess  at her next visit for improvement in symptoms Also advised to use OTC antihistamines in the event that they could be a sinus component to her dizziness.  Migraines Currently on Topamax and symptoms are not optimally controlled on current dose Advised to increase to 100 mg twice daily and will reassess for improvement at her next visit.  This note has been created with Education officer, environmental. Any transcriptional errors are unintentional.         Jaclyn Shaggy, MD. [facility] [phone] 11/30/2015, 11:22 AM

## 2015-11-30 NOTE — Progress Notes (Signed)
Patient c/o feeling dizzy since x1 month now that has gotten worse, day by day since initial onset .  Patient denies any pain today.  Requesting med refill.

## 2015-11-30 NOTE — Patient Instructions (Signed)

## 2015-12-07 ENCOUNTER — Encounter (HOSPITAL_COMMUNITY): Payer: Self-pay | Admitting: Emergency Medicine

## 2015-12-07 ENCOUNTER — Ambulatory Visit (HOSPITAL_COMMUNITY)
Admission: EM | Admit: 2015-12-07 | Discharge: 2015-12-07 | Disposition: A | Discharge status: Home / Self Care | Payer: Self-pay | Attending: Family Medicine | Admitting: Family Medicine

## 2015-12-07 DIAGNOSIS — Z833 Family history of diabetes mellitus: Secondary | ICD-10-CM | POA: Insufficient documentation

## 2015-12-07 DIAGNOSIS — Z79899 Other long term (current) drug therapy: Secondary | ICD-10-CM | POA: Insufficient documentation

## 2015-12-07 DIAGNOSIS — Z8249 Family history of ischemic heart disease and other diseases of the circulatory system: Secondary | ICD-10-CM | POA: Insufficient documentation

## 2015-12-07 DIAGNOSIS — J40 Bronchitis, not specified as acute or chronic: Secondary | ICD-10-CM | POA: Insufficient documentation

## 2015-12-07 LAB — POCT RAPID STREP A: Streptococcus, Group A Screen (Direct): NEGATIVE

## 2015-12-07 MED ORDER — BENZONATATE 100 MG PO CAPS: 100.0000 mg | ORAL_CAPSULE | Freq: Three times a day (TID) | ORAL | Status: DC

## 2015-12-07 MED ORDER — ALBUTEROL SULFATE HFA 108 (90 BASE) MCG/ACT IN AERS: 2.0000 | INHALATION_SPRAY | RESPIRATORY_TRACT | Status: DC | PRN

## 2015-12-07 MED ORDER — AMOXICILLIN 500 MG PO CAPS: 500.0000 mg | ORAL_CAPSULE | Freq: Three times a day (TID) | ORAL | Status: DC

## 2015-12-07 NOTE — ED Provider Notes (Signed)
CSN: [id number]     Arrival date & time 12/07/15  1554 History   First MD Initiated Contact with Patient 12/07/15 1801     Chief Complaint  Patient presents with  . Cough  . Sore Throat  . Headache   (Consider location/radiation/quality/duration/timing/severity/associated sxs/prior Treatment) HPI History obtained from patient:  Pt presents with the cc [id number]:cough Duration of symptoms:since saturday Treatment prior to arrival:otc meds without relief Context:sudden onset cough, sputum production, fever Other symptoms include: dizzy Pain score:3 from cough FAMILY HISTORY: parents HTN, DM SOCIAL HISTORY:non smoker  Past Medical History  Diagnosis Date  . Migraine   . GERD (gastroesophageal reflux disease)   . Lung nodules   . Kidney stones    Past Surgical History  Procedure Laterality Date  . Cesarean section    . Mandible surgery    . Tubal ligation     History reviewed. No pertinent family history. Social History  Substance Use Topics  . Smoking status: Never Smoker   . Smokeless tobacco: Never Used  . Alcohol Use: No   OB History    Gravida Para Term Preterm AB TAB SAB Ectopic Multiple Living   5 5       1       Review of Systems ROS +'ve cough, wheezes  Denies: HEADACHE, NAUSEA, ABDOMINAL PAIN, CHEST PAIN, CONGESTION, DYSURIA, SHORTNESS OF BREATH  Allergies  Shellfish allergy and Pork-derived products  Home Medications   Prior to Admission medications   Medication Sig Start Date End Date Taking? Authorizing Provider  meclizine (ANTIVERT) 25 MG tablet Take 1 tablet (25 mg total) by mouth 3 (three) times daily as needed for dizziness. 11/29/15  Yes Deatra CanterWilliam J Oxford, FNP  topiramate (TOPAMAX) 50 MG tablet Take 1 tablet by mouth 2 (two) times daily. 11/24/11  Yes Historical Provider, MD  albuterol (PROVENTIL HFA;VENTOLIN HFA) 108 (90 Base) MCG/ACT inhaler Inhale 2 puffs into the lungs every 4 (four) hours as needed for wheezing or shortness of breath. 12/07/15   Tharon AquasFrank  C Kora Groom, PA  amoxicillin (AMOXIL) 500 MG capsule Take 1 capsule (500 mg total) by mouth 3 (three) times daily. 12/07/15   Tharon AquasFrank C Starlina Lapre, PA  benzonatate (TESSALON) 100 MG capsule Take 1 capsule (100 mg total) by mouth every 8 (eight) hours. 12/07/15   Tharon AquasFrank C Devontae Casasola, PA  cyclobenzaprine (FLEXERIL) 5 MG tablet Take 1 tablet (5 mg total) by mouth at bedtime as needed (headache). 06/30/15   Charm RingsErin J Honig, MD  naproxen (NAPROSYN) 375 MG tablet Take 1 tablet (375 mg total) by mouth 2 (two) times daily. 06/30/15   Charm RingsErin J Honig, MD  ondansetron (ZOFRAN ODT) 4 MG disintegrating tablet Take 1 tablet (4 mg total) by mouth every 8 (eight) hours as needed for nausea or vomiting. 10/20/15   Danelle BerryLeisa Tapia, PA-C  oxyCODONE-acetaminophen (PERCOCET) 5-325 MG tablet Take 1 tablet by mouth every 4 (four) hours as needed. 10/20/15   Danelle BerryLeisa Tapia, PA-C  topiramate (TOPAMAX) 50 MG tablet Take 1 tablet (50 mg total) by mouth 2 (two) times daily. 11/29/15   Deatra CanterWilliam J Oxford, FNP   Meds Ordered and Administered this Visit  Medications - No data to display  BP 158/96 mmHg  Pulse 61  Temp(Src) 98.2 F (36.8 C) (Oral)  Resp 18  SpO2 99% No data found.   Physical Exam NURSES NOTES AND VITAL SIGNS REVIEWED. CONSTITUTIONAL: Well developed, well nourished, no acute distress HEENT: normocephalic, atraumatic EYES: Conjunctiva normal NECK:normal ROM, supple, no adenopathy PULMONARY:No respiratory distress,  normal effort, few exp wheezes bronchospasm cough ABDOMINAL: Soft, ND, NT BS+, No CVAT MUSCULOSKELETAL: Normal ROM of all extremities,  SKIN: warm and dry without rash PSYCHIATRIC: Mood and affect, behavior are normal  ED Course  Procedures (including critical care time)  Labs Review Labs Reviewed  POCT RAPID STREP A  I HAVE PERSONALLY REVIEWED TESTS RESULTS WITH PATIENT.   Imaging Review No results found.    Visual Acuity Review  Right Eye Distance:   Left Eye Distance:   Bilateral Distance:    Right  Eye Near:   Left Eye Near:    Bilateral Near:      Amoxil, albuterol, tessalon   MDM   1. Bronchitis     Patient is reassured that there are no issues that require transfer to higher level of care at this time or additional tests. Patient is advised to continue home symptomatic treatment. Patient is advised that if there are new or worsening symptoms to attend the emergency department, contact primary care provider, or return to UC. Instructions of care provided discharged home in stable condition.    THIS NOTE WAS GENERATED USING A VOICE RECOGNITION SOFTWARE PROGRAM. ALL REASONABLE EFFORTS  WERE MADE TO PROOFREAD THIS DOCUMENT FOR ACCURACY.  I have verbally reviewed the discharge instructions with the patient. A printed AVS was given to the patient.  All questions were answered prior to discharge.      Tharon Aquas, PA 12/07/15 [phone]

## 2015-12-07 NOTE — Discharge Instructions (Signed)
Upper Respiratory Infection, Adult °Most upper respiratory infections (URIs) are caused by a virus. A URI affects the nose, throat, and upper air passages. The most common type of URI is often called "the common cold." °HOME CARE  °· Take medicines only as told by your doctor. °· Gargle warm saltwater or take cough drops to comfort your throat as told by your doctor. °· Use a warm mist humidifier or inhale steam from a shower to increase air moisture. This may make it easier to breathe. °· Drink enough fluid to keep your pee (urine) clear or pale yellow. °· Eat soups and other clear broths. °· Have a healthy diet. °· Rest as needed. °· Go back to work when your fever is gone or your doctor says it is okay. °¨ You may need to stay home longer to avoid giving your URI to others. °¨ You can also wear a face mask and wash your hands often to prevent spread of the virus. °· Use your inhaler more if you have asthma. °· Do not use any tobacco products, including cigarettes, chewing tobacco, or electronic cigarettes. If you need help quitting, ask your doctor. °GET HELP IF: °· You are getting worse, not better. °· Your symptoms are not helped by medicine. °· You have chills. °· You are getting more short of breath. °· You have brown or red mucus. °· You have yellow or brown discharge from your nose. °· You have pain in your face, especially when you bend forward. °· You have a fever. °· You have puffy (swollen) neck glands. °· You have pain while swallowing. °· You have white areas in the back of your throat. °GET HELP RIGHT AWAY IF:  °· You have very bad or constant: °· Headache. °· Ear pain. °· Pain in your forehead, behind your eyes, and over your cheekbones (sinus pain). °· Chest pain. °· You have long-lasting (chronic) lung disease and any of the following: °· Wheezing. °· Long-lasting cough. °· Coughing up blood. °· A change in your usual mucus. °· You have a stiff neck. °· You have changes in  your: °· Vision. °· Hearing. °· Thinking. °· Mood. °MAKE SURE YOU:  °· Understand these instructions. °· Will watch your condition. °· Will get help right away if you are not doing well or get worse. °  °This information is not intended to replace advice given to you by your health care provider. Make sure you discuss any questions you have with your health care provider. °  °Document Released: 12/27/2007 Document Revised: 11/24/2014 Document Reviewed: 10/15/2013 °Elsevier Interactive Patient Education ©2016 [employer] °Acute Bronchitis °Bronchitis is when the airways that extend from the windpipe into the lungs get red, puffy, and painful (inflamed). Bronchitis often causes thick spit (mucus) to develop. This leads to a cough. A cough is the most common symptom of bronchitis. °In acute bronchitis, the condition usually begins suddenly and goes away over time (usually in 2 weeks). Smoking, allergies, and asthma can make bronchitis worse. Repeated episodes of bronchitis may cause more lung problems. °HOME CARE °· Rest. °· Drink enough fluids to keep your pee (urine) clear or pale yellow (unless you need to limit fluids as told by your doctor). °· Only take over-the-counter or prescription medicines as told by your doctor. °· Avoid smoking and secondhand smoke. These can make bronchitis worse. If you are a smoker, think about using nicotine gum or skin patches. Quitting smoking will help your lungs heal faster. °· Reduce the   chance of getting bronchitis again by: °¨ Washing your hands often. °¨ Avoiding people with cold symptoms. °¨ Trying not to touch your hands to your mouth, nose, or eyes. °· Follow up with your doctor as told. °GET HELP IF: °Your symptoms do not improve after 1 week of treatment. Symptoms include: °· Cough. °· Fever. °· Coughing up thick spit. °· Body aches. °· Chest congestion. °· Chills. °· Shortness of breath. °· Sore throat. °GET HELP RIGHT AWAY IF:  °· You have an increased fever. °· You  have chills. °· You have severe shortness of breath. °· You have bloody thick spit (sputum). °· You throw up (vomit) often. °· You lose too much body fluid (dehydration). °· You have a severe headache. °· You faint. °MAKE SURE YOU:  °· Understand these instructions. °· Will watch your condition. °· Will get help right away if you are not doing well or get worse. °  °This information is not intended to replace advice given to you by your health care provider. Make sure you discuss any questions you have with your health care provider. °  °Document Released: 12/27/2007 Document Revised: 03/12/2013 Document Reviewed: 12/31/2012 °Elsevier Interactive Patient Education ©2016 [employer] ° °

## 2015-12-07 NOTE — ED Notes (Signed)
[facility] a [facility] a headache, sore throat and a cough x 1 day.

## 2015-12-10 ENCOUNTER — Encounter (HOSPITAL_COMMUNITY): Payer: Self-pay

## 2015-12-10 ENCOUNTER — Emergency Department (HOSPITAL_COMMUNITY)
Admission: EM | Admit: 2015-12-10 | Discharge: 2015-12-10 | Disposition: A | Discharge status: Home / Self Care | Payer: Self-pay | Attending: Emergency Medicine | Admitting: Emergency Medicine

## 2015-12-10 ENCOUNTER — Emergency Department (HOSPITAL_COMMUNITY): Payer: Self-pay

## 2015-12-10 DIAGNOSIS — Z792 Long term (current) use of antibiotics: Secondary | ICD-10-CM | POA: Insufficient documentation

## 2015-12-10 DIAGNOSIS — J209 Acute bronchitis, unspecified: Secondary | ICD-10-CM | POA: Insufficient documentation

## 2015-12-10 DIAGNOSIS — Z79899 Other long term (current) drug therapy: Secondary | ICD-10-CM | POA: Insufficient documentation

## 2015-12-10 DIAGNOSIS — Z7951 Long term (current) use of inhaled steroids: Secondary | ICD-10-CM | POA: Insufficient documentation

## 2015-12-10 LAB — CULTURE, GROUP A STREP (THRC)

## 2015-12-10 MED ORDER — IPRATROPIUM-ALBUTEROL 0.5-2.5 (3) MG/3ML IN SOLN
3.0000 mL | Freq: Once | RESPIRATORY_TRACT | Status: AC
  Administered 2015-12-10: 3 mL via RESPIRATORY_TRACT
  Filled 2015-12-10: qty 3

## 2015-12-10 MED ORDER — GUAIFENESIN-CODEINE 100-10 MG/5ML PO SOLN
10.0000 mL | Freq: Once | ORAL | Status: AC
  Administered 2015-12-10: 10 mL via ORAL
  Filled 2015-12-10: qty 10

## 2015-12-10 MED ORDER — ALBUTEROL SULFATE HFA 108 (90 BASE) MCG/ACT IN AERS
2.0000 | INHALATION_SPRAY | RESPIRATORY_TRACT | Status: DC | PRN
  Administered 2015-12-10: 2 via RESPIRATORY_TRACT
  Filled 2015-12-10: qty 6.7

## 2015-12-10 MED ORDER — ACETAMINOPHEN 325 MG PO TABS
650.0000 mg | ORAL_TABLET | Freq: Once | ORAL | Status: AC
  Administered 2015-12-10: 650 mg via ORAL
  Filled 2015-12-10: qty 2

## 2015-12-10 MED ORDER — IBUPROFEN 800 MG PO TABS
800.0000 mg | ORAL_TABLET | Freq: Once | ORAL | Status: AC
  Administered 2015-12-10: 800 mg via ORAL
  Filled 2015-12-10: qty 1

## 2015-12-10 MED ORDER — PREDNISONE 20 MG PO TABS
60.0000 mg | ORAL_TABLET | Freq: Once | ORAL | Status: AC
  Administered 2015-12-10: 60 mg via ORAL
  Filled 2015-12-10: qty 3

## 2015-12-10 MED ORDER — PREDNISONE 20 MG PO TABS: 60.0000 mg | ORAL_TABLET | Freq: Every day | ORAL | Status: DC

## 2015-12-10 MED ORDER — HYDROCODONE-HOMATROPINE 5-1.5 MG/5ML PO SYRP: 5.0000 mL | ORAL_SOLUTION | Freq: Four times a day (QID) | ORAL | Status: DC | PRN

## 2015-12-10 MED ORDER — ALBUTEROL SULFATE (2.5 MG/3ML) 0.083% IN NEBU
2.5000 mg | INHALATION_SOLUTION | Freq: Once | RESPIRATORY_TRACT | Status: AC
  Administered 2015-12-10: 2.5 mg via RESPIRATORY_TRACT
  Filled 2015-12-10: qty 3

## 2015-12-10 NOTE — ED Notes (Signed)
Pt was dx with bronchitis on Monday and given amoxicillan and tessalon perles and she has no relief and feels worse.

## 2015-12-10 NOTE — ED Notes (Signed)
MD at bedside. 

## 2015-12-10 NOTE — Discharge Instructions (Signed)

## 2015-12-10 NOTE — ED Provider Notes (Signed)
CSN: [id number]     Arrival date & time 12/10/15  0358 History   First MD Initiated Contact with Patient 12/10/15 [phone]     Chief Complaint  Patient presents with  . Cough     (Consider location/radiation/quality/duration/timing/severity/associated sxs/prior Treatment) HPI Comments: Presents to the emergency room for evaluation of cough for nearly one week. Patient reports that she was seen in urgent care earlier in the week and diagnosed with bronchitis. She was given amoxicillin and Tessalon Perles. She could not afford the albuterol inhaler she was prescribed. Patient reports that she and she was seen in the cough has become more prevalent and she feels tightness in her breathing.  Patient is a 50 y.o. female presenting with cough.  Cough   Past Medical History  Diagnosis Date  . Migraine   . GERD (gastroesophageal reflux disease)   . Lung nodules   . Kidney stones    Past Surgical History  Procedure Laterality Date  . Cesarean section    . Mandible surgery    . Tubal ligation     History reviewed. No pertinent family history. Social History  Substance Use Topics  . Smoking status: Never Smoker   . Smokeless tobacco: Never Used  . Alcohol Use: No   OB History    Gravida Para Term Preterm AB TAB SAB Ectopic Multiple Living   Review of Systems  Respiratory: Positive for cough.   All other systems reviewed and are negative.     Allergies  Shellfish allergy and Pork-derived products  Home Medications   Prior to Admission medications   Medication Sig Start Date End Date Taking? Authorizing Provider  albuterol (PROVENTIL HFA;VENTOLIN HFA) 108 (90 Base) MCG/ACT inhaler Inhale 2 puffs into the lungs every 4 (four) hours as needed for wheezing or shortness of breath. 12/07/15  Yes Tharon Aquas, PA  amoxicillin (AMOXIL) 500 MG capsule Take 1 capsule (500 mg total) by mouth 3 (three) times daily. 12/07/15  Yes Tharon Aquas, PA  benzonatate  (TESSALON) 100 MG capsule Take 1 capsule (100 mg total) by mouth every 8 (eight) hours. 12/07/15  Yes Tharon Aquas, PA  meclizine (ANTIVERT) 25 MG tablet Take 1 tablet (25 mg total) by mouth 3 (three) times daily as needed for dizziness. 11/29/15  Yes Deatra Canter, FNP  naproxen (NAPROSYN) 375 MG tablet Take 1 tablet (375 mg total) by mouth 2 (two) times daily. 06/30/15  Yes Charm Rings, MD  ondansetron (ZOFRAN ODT) 4 MG disintegrating tablet Take 1 tablet (4 mg total) by mouth every 8 (eight) hours as needed for nausea or vomiting. 10/20/15  Yes Danelle Berry, PA-C  topiramate (TOPAMAX) 50 MG tablet Take 1 tablet (50 mg total) by mouth 2 (two) times daily. 11/29/15  Yes Deatra Canter, FNP  cyclobenzaprine (FLEXERIL) 5 MG tablet Take 1 tablet (5 mg total) by mouth at bedtime as needed (headache). 06/30/15   Charm Rings, MD  oxyCODONE-acetaminophen (PERCOCET) 5-325 MG tablet Take 1 tablet by mouth every 4 (four) hours as needed. 10/20/15   Danelle Berry, PA-C   BP 141/81 mmHg  Pulse 76  Temp(Src) 98.7 F (37.1 C) (Oral)  Resp 22  Ht  (1.702 m)  Wt 260 lb (117.935 kg)  BMI 40.71 kg/m2  SpO2 94% Physical Exam  Constitutional: She is oriented to person, place, and time. She appears well-developed and well-nourished. No distress.  HENT:  Head: Normocephalic and atraumatic.  Right Ear: Hearing normal.  Left Ear: Hearing normal.  Nose: Nose normal.  Mouth/Throat: Oropharynx is clear and moist and mucous membranes are normal.  Eyes: Conjunctivae and EOM are normal. Pupils are equal, round, and reactive to light.  Neck: Normal range of motion. Neck supple.  Cardiovascular: Regular rhythm, S1 normal and S2 normal.  Exam reveals no gallop and no friction rub.   No murmur heard. Pulmonary/Chest: Effort normal. No respiratory distress. She has wheezes. She exhibits no tenderness.  Abdominal: Soft. Normal appearance and bowel sounds are normal. There is no hepatosplenomegaly. There is no  tenderness. There is no rebound, no guarding, no tenderness at McBurney's point and negative Murphy's sign. No hernia.  Musculoskeletal: Normal range of motion.  Neurological: She is alert and oriented to person, place, and time. She has normal strength. No cranial nerve deficit or sensory deficit. Coordination normal. GCS eye subscore is 4. GCS verbal subscore is 5. GCS motor subscore is 6.  Skin: Skin is warm, dry and intact. No rash noted. No cyanosis.  Psychiatric: She has a normal mood and affect. Her speech is normal and behavior is normal. Thought content normal.  Nursing note and vitals reviewed.   ED Course  Procedures (including critical care time) Labs Review Labs Reviewed - No data to display  Imaging Review Dg Chest 2 View  12/10/2015  CLINICAL DATA:  Cough, congestion, and fever for 48 hours. EXAM: CHEST  2 VIEW COMPARISON:  06/18/2015 FINDINGS: The heart size and mediastinal contours are within normal limits. Both lungs are clear. The visualized skeletal structures are unremarkable. IMPRESSION: No active cardiopulmonary disease. Electronically Signed   By: Burman NievesWilliam  Stevens M.D.   On: 12/10/2015 04:30   I have personally reviewed and evaluated these images and lab results as part of my medical decision-making.   EKG Interpretation None      MDM   Final diagnoses:  Acute bronchitis, unspecified organism    Patient presents to the ER for evaluation of worsening cough and shortness of breath. Patient was seen earlier in the week at urgent care and diagnosed with acute bronchitis. She was prescribed amoxicillin and Tessalon Perles which are not helping. She was also given a prescription for albuterol, but could not afford it. Patient did have active wheezing on arrival to the ER. She also has a persistent cough. Chest x-ray does not show evidence of pneumonia. Patient administer bronchodilator therapy here in the ER with some improvement. She was also administered prednisone.  She will require continued outpatient treatment with prednisone and albuterol. She will be given an albuterol inhaler to go with and is to follow-up as needed.    Gilda Creasehristopher J Rabecka Brendel, MD 12/10/15 [phone]

## 2015-12-11 DIAGNOSIS — R1084 Generalized abdominal pain: Secondary | ICD-10-CM | POA: Insufficient documentation

## 2015-12-11 DIAGNOSIS — Z9851 Tubal ligation status: Secondary | ICD-10-CM | POA: Insufficient documentation

## 2015-12-11 DIAGNOSIS — Z79899 Other long term (current) drug therapy: Secondary | ICD-10-CM | POA: Insufficient documentation

## 2015-12-11 DIAGNOSIS — Z8719 Personal history of other diseases of the digestive system: Secondary | ICD-10-CM | POA: Insufficient documentation

## 2015-12-11 DIAGNOSIS — Z792 Long term (current) use of antibiotics: Secondary | ICD-10-CM | POA: Insufficient documentation

## 2015-12-11 DIAGNOSIS — Z791 Long term (current) use of non-steroidal anti-inflammatories (NSAID): Secondary | ICD-10-CM | POA: Insufficient documentation

## 2015-12-11 DIAGNOSIS — Z7952 Long term (current) use of systemic steroids: Secondary | ICD-10-CM | POA: Insufficient documentation

## 2015-12-11 DIAGNOSIS — G43909 Migraine, unspecified, not intractable, without status migrainosus: Secondary | ICD-10-CM | POA: Insufficient documentation

## 2015-12-11 DIAGNOSIS — Z87442 Personal history of urinary calculi: Secondary | ICD-10-CM | POA: Insufficient documentation

## 2015-12-11 DIAGNOSIS — B372 Candidiasis of skin and nail: Secondary | ICD-10-CM | POA: Insufficient documentation

## 2015-12-11 DIAGNOSIS — R112 Nausea with vomiting, unspecified: Secondary | ICD-10-CM | POA: Insufficient documentation

## 2015-12-11 DIAGNOSIS — R197 Diarrhea, unspecified: Secondary | ICD-10-CM | POA: Insufficient documentation

## 2015-12-11 DIAGNOSIS — R062 Wheezing: Secondary | ICD-10-CM | POA: Insufficient documentation

## 2015-12-12 ENCOUNTER — Emergency Department (HOSPITAL_COMMUNITY)
Admission: EM | Admit: 2015-12-12 | Discharge: 2015-12-12 | Disposition: A | Discharge status: Home / Self Care | Payer: Self-pay | Attending: Emergency Medicine | Admitting: Emergency Medicine

## 2015-12-12 ENCOUNTER — Encounter (HOSPITAL_COMMUNITY): Payer: Self-pay | Admitting: Emergency Medicine

## 2015-12-12 DIAGNOSIS — B372 Candidiasis of skin and nail: Secondary | ICD-10-CM

## 2015-12-12 DIAGNOSIS — R112 Nausea with vomiting, unspecified: Secondary | ICD-10-CM

## 2015-12-12 DIAGNOSIS — R197 Diarrhea, unspecified: Secondary | ICD-10-CM

## 2015-12-12 LAB — COMPREHENSIVE METABOLIC PANEL
ALBUMIN: 3.9 g/dL (ref 3.5–5.0)
ALT: 19 U/L (ref 14–54)
AST: 20 U/L (ref 15–41)
Alkaline Phosphatase: 114 U/L (ref 38–126)
Anion gap: 8 (ref 5–15)
BILIRUBIN TOTAL: 0.4 mg/dL (ref 0.3–1.2)
BUN: 17 mg/dL (ref 6–20)
CO2: 20 mmol/L — ABNORMAL LOW (ref 22–32)
CREATININE: 1.01 mg/dL — AB (ref 0.44–1.00)
Calcium: 9.7 mg/dL (ref 8.9–10.3)
Chloride: 110 mmol/L (ref 101–111)
GFR calc Af Amer: >60 mL/min (ref >60)
GLUCOSE: 137 mg/dL — AB (ref 65–99)
Potassium: 3.7 mmol/L (ref 3.5–5.1)
Sodium: 138 mmol/L (ref 135–145)
TOTAL PROTEIN: 7.4 g/dL (ref 6.5–8.1)

## 2015-12-12 LAB — URINALYSIS, ROUTINE W REFLEX MICROSCOPIC
BILIRUBIN URINE: NEGATIVE
GLUCOSE, UA: NEGATIVE mg/dL
KETONES UR: NEGATIVE mg/dL
Leukocytes, UA: NEGATIVE
NITRITE: NEGATIVE
PH: 5.5 (ref 5.0–8.0)
PROTEIN: NEGATIVE mg/dL
Specific Gravity, Urine: 1.027 (ref 1.005–1.030)

## 2015-12-12 LAB — CBC
HCT: 38.4 % (ref 36.0–46.0)
Hemoglobin: 11.7 g/dL — ABNORMAL LOW (ref 12.0–15.0)
MCH: 25.6 pg — ABNORMAL LOW (ref 26.0–34.0)
MCHC: 30.5 g/dL (ref 30.0–36.0)
MCV: 84 fL (ref 78.0–100.0)
PLATELETS: 350 10*3/uL (ref 150–400)
RBC: 4.57 MIL/uL (ref 3.87–5.11)
RDW: 13.9 % (ref 11.5–15.5)
WBC: 10.8 10*3/uL — AB (ref 4.0–10.5)

## 2015-12-12 LAB — URINE MICROSCOPIC-ADD ON

## 2015-12-12 LAB — LIPASE, BLOOD: Lipase: 34 U/L (ref 11–51)

## 2015-12-12 MED ORDER — ALBUTEROL SULFATE (2.5 MG/3ML) 0.083% IN NEBU
5.0000 mg | INHALATION_SOLUTION | Freq: Once | RESPIRATORY_TRACT | Status: AC
  Administered 2015-12-12: 5 mg via RESPIRATORY_TRACT
  Filled 2015-12-12: qty 6

## 2015-12-12 MED ORDER — NYSTATIN 100000 UNIT/GM EX CREA: TOPICAL_CREAM | CUTANEOUS | Status: DC

## 2015-12-12 MED ORDER — DICYCLOMINE HCL 10 MG/ML IM SOLN
20.0000 mg | Freq: Once | INTRAMUSCULAR | Status: AC
  Administered 2015-12-12: 20 mg via INTRAMUSCULAR
  Filled 2015-12-12: qty 2

## 2015-12-12 MED ORDER — ONDANSETRON HCL 4 MG/2ML IJ SOLN
4.0000 mg | Freq: Once | INTRAMUSCULAR | Status: AC
  Administered 2015-12-12: 4 mg via INTRAVENOUS
  Filled 2015-12-12: qty 2

## 2015-12-12 MED ORDER — ACETAMINOPHEN 325 MG PO TABS
650.0000 mg | ORAL_TABLET | Freq: Once | ORAL | Status: AC
  Administered 2015-12-12: 650 mg via ORAL
  Filled 2015-12-12: qty 2

## 2015-12-12 MED ORDER — SODIUM CHLORIDE 0.9 % IV BOLUS (SEPSIS)
1000.0000 mL | Freq: Once | INTRAVENOUS | Status: AC
  Administered 2015-12-12: 1000 mL via INTRAVENOUS

## 2015-12-12 MED ORDER — IPRATROPIUM BROMIDE 0.02 % IN SOLN
0.5000 mg | Freq: Once | RESPIRATORY_TRACT | Status: AC
  Administered 2015-12-12: 0.5 mg via RESPIRATORY_TRACT
  Filled 2015-12-12: qty 2.5

## 2015-12-12 MED ORDER — ONDANSETRON HCL 4 MG PO TABS: 4.0000 mg | ORAL_TABLET | Freq: Four times a day (QID) | ORAL | Status: DC

## 2015-12-12 NOTE — ED Notes (Signed)
Patient arrives with complaints of nausea, vomiting, diarrhea, and rash under left breast. States onset of the NV about 1.5 weeks ago. 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

## 2015-12-12 NOTE — ED Provider Notes (Signed)
CSN: [id number]     Arrival date & time 12/11/15  2333 History   First MD Initiated Contact with Patient 12/12/15 0617     Chief Complaint  Patient presents with  . Nausea  . Emesis  . Rash     (Consider location/radiation/quality/duration/timing/severity/associated sxs/prior Treatment) HPI Monique Tyler is a 50 y.o. female with PMH significant for GERD, urolithiasis who presents with gradual onset, intermittent, worsening nausea and vomiting over the last 1.5 weeks. She states that it became worse last night experiencing 4 episodes of N/V NB emesis and 4 episodes of nonbloody diarrhea prompting her visit today. Associated symptoms include generalized abdominal pain. No fever, melena, hematochezia, hematemesis, rectal pain, malodorous diarrhea, or urinary symptoms. No prior treatment. No aggravating factors. 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 She is also complaining of a burning, erythematous, non-pruritic, nondraining rash under her left breast she noted last night. Patient has had multiple ED visits in the past couple of months for urolithiasis and bronchitis. Patient was seen 2 days ago and sent home with prednisone and an albuterol inhaler. She has been taking amoxicillin as well.   Past Medical History  Diagnosis Date  . Migraine   . GERD (gastroesophageal reflux disease)   . Lung nodules   . Kidney stones    Past Surgical History  Procedure Laterality Date  . Cesarean section    . Mandible surgery    . Tubal ligation     History reviewed. No pertinent family history. Social History  Substance Use Topics  . Smoking status: Never Smoker   . Smokeless tobacco: Never Used  . Alcohol Use: No   OB History    Gravida Para Term Preterm AB TAB SAB Ectopic Multiple Living   5 5       1       Review of Systems All other systems negative unless otherwise stated in HPI   Allergies  Shellfish allergy and Pork-derived products  Home  Medications   Prior to Admission medications   Medication Sig Start Date End Date Taking? Authorizing Provider  albuterol (PROVENTIL HFA;VENTOLIN HFA) 108 (90 Base) MCG/ACT inhaler Inhale 2 puffs into the lungs every 4 (four) hours as needed for wheezing or shortness of breath. 12/07/15  Yes Tharon AquasFrank C Patrick, PA  amoxicillin (AMOXIL) 500 MG capsule Take 1 capsule (500 mg total) by mouth 3 (three) times daily. 12/07/15  Yes Tharon AquasFrank C Patrick, PA  benzonatate (TESSALON) 100 MG capsule Take 1 capsule (100 mg total) by mouth every 8 (eight) hours. 12/07/15  Yes Tharon AquasFrank C Patrick, PA  cyclobenzaprine (FLEXERIL) 5 MG tablet Take 1 tablet (5 mg total) by mouth at bedtime as needed (headache). 06/30/15  Yes Charm RingsErin J Honig, MD  HYDROcodone-homatropine [facility](HYCODAN) 5-1.5 MG/5ML syrup Take 5 mLs by mouth every 6 (six) hours as needed for cough. 12/10/15  Yes Gilda Creasehristopher J Pollina, MD  meclizine (ANTIVERT) 25 MG tablet Take 1 tablet (25 mg total) by mouth 3 (three) times daily as needed for dizziness. 11/29/15  Yes Deatra CanterWilliam J Oxford, FNP  naproxen (NAPROSYN) 375 MG tablet Take 1 tablet (375 mg total) by mouth 2 (two) times daily. 06/30/15  Yes Charm RingsErin J Honig, MD  ondansetron (ZOFRAN ODT) 4 MG disintegrating tablet Take 1 tablet (4 mg total) by mouth every 8 (eight) hours as needed for nausea or vomiting. 10/20/15  Yes Danelle BerryLeisa Tapia, PA-C  predniSONE (DELTASONE) 20 MG tablet Take 3 tablets (60 mg total) by mouth daily  with breakfast. 12/10/15  Yes Gilda Crease, MD  topiramate (TOPAMAX) 50 MG tablet Take 1 tablet (50 mg total) by mouth 2 (two) times daily. 11/29/15  Yes Deatra Canter, FNP  nystatin cream (MYCOSTATIN) Apply to affected area 2 times daily 12/12/15   Cheri Fowler, PA-C  ondansetron (ZOFRAN) 4 MG tablet Take 1 tablet (4 mg total) by mouth every 6 (six) hours. 12/12/15   Cheri Fowler, PA-C  oxyCODONE-acetaminophen (PERCOCET) 5-325 MG tablet Take 1 tablet by mouth every 4 (four) hours as needed. Patient not taking:  Reported on 12/12/2015 10/20/15   Danelle Berry, PA-C   BP 113/64 mmHg  Pulse 65  Temp(Src) 98 F (36.7 C) (Oral)  Resp 20  Ht  (1.702 m)  Wt 117.482 kg  BMI 40.56 kg/m2  SpO2 95% Physical Exam  Constitutional: She is oriented to person, place, and time. She appears well-developed and well-nourished.  Non-toxic appearance. She does not have a sickly appearance. She does not appear ill.  HENT:  Head: Normocephalic and atraumatic.  Mouth/Throat: Oropharynx is clear and moist.  Eyes: Conjunctivae are normal. Pupils are equal, round, and reactive to light.  Neck: Normal range of motion. Neck supple.  Cardiovascular: Normal rate and regular rhythm.   Pulmonary/Chest: Effort normal. No accessory muscle usage or stridor. No respiratory distress. She has no decreased breath sounds. She has wheezes (very minimal ). She has rhonchi. She has no rales.  Abdominal: Soft. Bowel sounds are normal. She exhibits no distension. There is no tenderness. There is no rebound and no guarding.  Musculoskeletal: Normal range of motion.  Lymphadenopathy:    She has no cervical adenopathy.  Neurological: She is alert and oriented to person, place, and time.  Speech clear without dysarthria.  Skin: Skin is warm and dry.  Tender erythematous beefy red plaque-like rash underneath the left breast. No drainage. Not in a dermatomal pattern. No vesicles. No induration or fluctuance.  Psychiatric: She has a normal mood and affect. Her behavior is normal.    ED Course  Procedures (including critical care time) Labs Review Labs Reviewed  COMPREHENSIVE METABOLIC PANEL - Abnormal; Notable for the following:    CO2 20 (*)    Glucose, Bld 137 (*)    Creatinine, Ser 1.01 (*)    All other components within normal limits  CBC - Abnormal; Notable for the following:    WBC 10.8 (*)    Hemoglobin 11.7 (*)    MCH 25.6 (*)    All other components within normal limits  URINALYSIS, ROUTINE W REFLEX MICROSCOPIC (NOT AT  [facility]) - Abnormal; Notable for the following:    APPearance CLOUDY (*)    Hgb urine dipstick SMALL (*)    All other components within normal limits  URINE MICROSCOPIC-ADD ON - Abnormal; Notable for the following:    Squamous Epithelial / LPF 6-30 (*)    Bacteria, UA RARE (*)    Crystals CA OXALATE CRYSTALS (*)    All other components within normal limits  LIPASE, BLOOD    Imaging Review No results found. I have personally reviewed and evaluated these images and lab results as part of my medical decision-making.   EKG Interpretation None      MDM   Final diagnoses:  Non-intractable vomiting with nausea, vomiting of unspecified type  Diarrhea, unspecified type  Intertriginous candidiasis   Patient presents with intermittent N/V over the past 1.5 weeks that became worse last night with 4 episodes of NBNB emesis and  4 episodes of non-bloody diarrhea.  She has not had any vomiting during the course of her ED visit (9 hours). Associated generalized abdominal pain.  Recently seen 2 days ago for bronchitis and sent home with prednisone and albuterol.  Prior to that treated with Amoxicillin.  Also with rash under left breath.  No fever, chills, hematemesis, melena, or hematochezia.  VSS, NAD.  Patient appears well, nontoxic or septic appearing. On exam, heart RRR, lungs with rhonchi and minimal wheezing. Abdomen soft and nontender.  Rash under left breast consistent with candida. Mild leukocytosis, 10.8, likely due to prednisone.  Otherwise, labs without acute abnormalities.  UA without signs of infection.  Will give IVF, Zofran, and Bentyl and reassess.  Upon reassessment, patient reports improvement.  Patient able to tolerate PO without difficulty.  Will discharge home with Zofran and Nystatin.  Low suspicion for c. Diff at this time.  I suspect it is likely related to amoxicillin, advised patient to discontinue.   No indication for abx at this time.  Low suspicion for acute intra-abdominal  pathology.  Recent CT scan 10/20/15 did not show evidence of diverticulosis. Follow up PCP in 2-3 days for possible . Diff testing if diarrhea continues. Discussed return precautions including worsening diarrhea, abdominal pain, or inability to tolerate PO intake.  Patient agrees and acknowledges the above plan for discharge.      Cheri Fowler, PA-C 12/12/15 1610  Gilda Crease, MD 12/13/15 [phone]

## 2015-12-12 NOTE — Discharge Instructions (Signed)
Your lab work is unremarkable today.  Continue taking your home medications.  Your rash is consistent with Candida, please apply Nystatin twice daily to the area until it heals.  Your symptoms are likely viral and related to Amoxicillin.  STOP taking this medication.  If you experience continued diarrhea, please follow up with your primary care doctor for further evaluation.  Take Zofran as needed for nausea/vomiting.  Continue taking your prednisone and using your albuterol inhaler for your bronchitis.  Please return to the ED if you experience severe abdominal pain, bloody diarrhea, fever, or are not able to keep fluids down.  Diarrhea Diarrhea is frequent loose and watery bowel movements. It can cause you to feel weak and dehydrated. Dehydration can cause you to become tired and thirsty, have a dry mouth, and have decreased urination that often is dark yellow. Diarrhea is a sign of another problem, most often an infection that will not last long. In most cases, diarrhea typically lasts 2-3 days. However, it can last longer if it is a sign of something more serious. It is important to treat your diarrhea as directed by your caregiver to lessen or prevent future episodes of diarrhea. CAUSES  Some common causes include:  Gastrointestinal infections caused by viruses, bacteria, or parasites.  Food poisoning or food allergies.  Certain medicines, such as antibiotics, chemotherapy, and laxatives.  Artificial sweeteners and fructose.  Digestive disorders. HOME CARE INSTRUCTIONS  Ensure adequate fluid intake (hydration): Have 1 cup (8 oz) of fluid for each diarrhea episode. Avoid fluids that contain simple sugars or sports drinks, fruit juices, whole milk products, and sodas. Your urine should be clear or pale yellow if you are drinking enough fluids. Hydrate with an oral rehydration solution that you can purchase at pharmacies, retail stores, and online. You can prepare an oral rehydration solution at  home by mixing the following ingredients together:   - tsp table salt.   tsp baking soda.   tsp salt substitute containing potassium chloride.  1  tablespoons sugar.  1 L (34 oz) of water.  Certain foods and beverages may increase the speed at which food moves through the gastrointestinal (GI) tract. These foods and beverages should be avoided and include:  Caffeinated and alcoholic beverages.  High-fiber foods, such as raw fruits and vegetables, nuts, seeds, and whole grain breads and cereals.  Foods and beverages sweetened with sugar alcohols, such as xylitol, sorbitol, and mannitol.  Some foods may be well tolerated and may help thicken stool including:  Starchy foods, such as rice, toast, pasta, low-sugar cereal, oatmeal, grits, baked potatoes, crackers, and bagels.  Bananas.  Applesauce.  Add probiotic-rich foods to help increase healthy bacteria in the GI tract, such as yogurt and fermented milk products.  Wash your hands well after each diarrhea episode.  Only take over-the-counter or prescription medicines as directed by your caregiver.  Take a warm bath to relieve any burning or pain from frequent diarrhea episodes. SEEK IMMEDIATE MEDICAL CARE IF:   You are unable to keep fluids down.  You have persistent vomiting.  You have blood in your stool, or your stools are black and tarry.  You do not urinate in 6-8 hours, or there is only a small amount of very dark urine.  You have abdominal pain that increases or localizes.  You have weakness, dizziness, confusion, or light-headedness.  You have a severe headache.  Your diarrhea gets worse or does not get better.  You have a fever or persistent  symptoms for more than 2-3 days.  You have a fever and your symptoms suddenly get worse. MAKE SURE YOU:   Understand these instructions.  Will watch your condition.  Will get help right away if you are not doing well or get worse.   This information is not  intended to replace advice given to you by your health care provider. Make sure you discuss any questions you have with your health care provider.   Document Released: 06/30/2002 Document Revised: 07/31/2014 Document Reviewed: 03/17/2012 Elsevier Interactive Patient Education [employer]

## 2015-12-16 ENCOUNTER — Ambulatory Visit: Payer: Self-pay | Attending: Family Medicine | Admitting: Family Medicine

## 2015-12-16 ENCOUNTER — Encounter: Payer: Self-pay | Admitting: Family Medicine

## 2015-12-16 VITALS — BP 114/79 | HR 69 | Temp 98.4°F | Ht 67.0 in | Wt 256.4 lb

## 2015-12-16 DIAGNOSIS — R197 Diarrhea, unspecified: Secondary | ICD-10-CM | POA: Insufficient documentation

## 2015-12-16 DIAGNOSIS — H8110 Benign paroxysmal vertigo, unspecified ear: Secondary | ICD-10-CM | POA: Insufficient documentation

## 2015-12-16 DIAGNOSIS — R42 Dizziness and giddiness: Secondary | ICD-10-CM | POA: Insufficient documentation

## 2015-12-16 DIAGNOSIS — H8113 Benign paroxysmal vertigo, bilateral: Secondary | ICD-10-CM

## 2015-12-16 DIAGNOSIS — G43709 Chronic migraine without aura, not intractable, without status migrainosus: Secondary | ICD-10-CM | POA: Insufficient documentation

## 2015-12-16 DIAGNOSIS — Z79899 Other long term (current) drug therapy: Secondary | ICD-10-CM | POA: Insufficient documentation

## 2015-12-16 DIAGNOSIS — J0120 Acute ethmoidal sinusitis, unspecified: Secondary | ICD-10-CM | POA: Insufficient documentation

## 2015-12-16 MED ORDER — TOPIRAMATE 100 MG PO TABS: 100.0000 mg | ORAL_TABLET | Freq: Two times a day (BID) | ORAL | Status: DC

## 2015-12-16 MED ORDER — CETIRIZINE HCL 10 MG PO TABS: 10.0000 mg | ORAL_TABLET | Freq: Every day | ORAL | Status: DC

## 2015-12-16 MED FILL — ?ONDANSETRON HCL 4 MG TABLE: 4 | 3 days supply | Qty: 12 | Fill #0

## 2015-12-16 NOTE — Patient Instructions (Signed)

## 2015-12-16 NOTE — Progress Notes (Signed)
Pt here for f/u for vertigo and migraine. Pt recently. hospitalized for acute bronchitis and rash under left breast.

## 2015-12-16 NOTE — Progress Notes (Signed)
Subjective:  Patient ID: Monique DupreBeth Gebert, female    DOB: May 19, 1966  Age: 50 y.o. MRN: [id number]  CC: Dizziness and Migraine   HPI Monique Tyler is a 50 year old female with a history of migraines, vertigo who had an increase in dose of Topamax at her last office visit for better control of migraines and has also been on meclizine for vertigo. She comes in today for reassessment of symptoms but she reports she is unable to point out any difference or improvement in symptoms.  Since her last visit she has been seen at urgent care and at the emergency room where she was initially treated for bronchitis with amoxicillin but then developed diarrhea which she has had for the last 1 week and so was advised to stop the amoxicillin and was placed on nebulizer treatments at the ED due to wheezing and was discharged on Proventil and prednisone. Chest x-ray at the ED was negative for pneumonia. She complains of cough," a hard time breathing at night". She also has diarrhea every time she eats. Denies fever, nausea or vomiting.  Outpatient Prescriptions Prior to Visit  Medication Sig Dispense Refill  . albuterol (PROVENTIL HFA;VENTOLIN HFA) 108 (90 Base) MCG/ACT inhaler Inhale 2 puffs into the lungs every 4 (four) hours as needed for wheezing or shortness of breath. 1 Inhaler 0  . benzonatate (TESSALON) 100 MG capsule Take 1 capsule (100 mg total) by mouth every 8 (eight) hours. 21 capsule 0  . meclizine (ANTIVERT) 25 MG tablet Take 1 tablet (25 mg total) by mouth 3 (three) times daily as needed for dizziness. 21 tablet 0  . nystatin cream (MYCOSTATIN) Apply to affected area 2 times daily 15 g 0  . ondansetron (ZOFRAN) 4 MG tablet Take 1 tablet (4 mg total) by mouth every 6 (six) hours. 12 tablet 0  . topiramate (TOPAMAX) 50 MG tablet Take 1 tablet (50 mg total) by mouth 2 (two) times daily. 60 tablet 0  . amoxicillin (AMOXIL) 500 MG capsule Take 1 capsule (500 mg total) by mouth 3 (three) times daily.  (Patient not taking: Reported on 12/16/2015) 21 capsule 0  . HYDROcodone-homatropine (HYCODAN) 5-1.5 MG/5ML syrup Take 5 mLs by mouth every 6 (six) hours as needed for cough. (Patient not taking: Reported on 12/16/2015) 75 mL 0  . oxyCODONE-acetaminophen (PERCOCET) 5-325 MG tablet Take 1 tablet by mouth every 4 (four) hours as needed. (Patient not taking: Reported on 12/12/2015) 20 tablet 0  . predniSONE (DELTASONE) 20 MG tablet Take 3 tablets (60 mg total) by mouth daily with breakfast. (Patient not taking: Reported on 12/16/2015) 15 tablet 0  . cyclobenzaprine (FLEXERIL) 5 MG tablet Take 1 tablet (5 mg total) by mouth at bedtime as needed (headache). (Patient not taking: Reported on 12/16/2015) 30 tablet 0  . naproxen (NAPROSYN) 375 MG tablet Take 1 tablet (375 mg total) by mouth 2 (two) times daily. (Patient not taking: Reported on 12/16/2015) 60 tablet 0  . ondansetron (ZOFRAN ODT) 4 MG disintegrating tablet Take 1 tablet (4 mg total) by mouth every 8 (eight) hours as needed for nausea or vomiting. (Patient not taking: Reported on 12/16/2015) 10 tablet 0   No facility-administered medications prior to visit.    ROS Review of Systems  Constitutional: Negative for activity change, appetite change and fatigue.  HENT: Negative for congestion, sinus pressure and sore throat.   Eyes: Negative for visual disturbance.  Respiratory: Positive for cough and shortness of breath. Negative for chest tightness and wheezing.  Cardiovascular: Negative for chest pain and palpitations.  Gastrointestinal: Positive for diarrhea. Negative for abdominal pain, constipation and abdominal distention.  Endocrine: Negative for polydipsia.  Genitourinary: Negative for dysuria and frequency.  Musculoskeletal: Negative for back pain and arthralgias.  Skin: Negative for rash.  Neurological: Negative for tremors, light-headedness and numbness.  Hematological: Does not bruise/bleed easily.  Psychiatric/Behavioral: Negative for  behavioral problems and agitation.    Objective:  BP 114/79 mmHg  Pulse 69  Temp(Src) 98.4 F (36.9 C) (Oral)  Ht  (1.702 m)  Wt 256 lb 6.4 oz (116.302 kg)  BMI 40.15 kg/m2  SpO2 97%  BP/Weight 12/16/2015 12/12/2015 12/10/2015  Systolic BP 114 109 118  Diastolic BP 79 65 92  Wt. (Lbs) 256.4 259 260  BMI 40.15 40.56 40.71      Physical Exam Constitutional: Patient appears well-developed and well-nourished. No distress. HENT: Normocephalic, atraumatic, External right and left ear normal. Oropharynx is clear and moist, Left maxillary sinus tenderness Eyes: Conjunctivae and EOM are normal. PERRLA, no scleral icterus. Neck: Normal ROM. Neck supple. No JVD. No tracheal deviation. No thyromegaly. CVS: RRR, S1/S2 +, no murmurs, no gallops, no carotid bruit.  Pulmonary: Effort and breath sounds normal, no stridor, rhonchi, wheezes, rales.  Abdominal: Soft. BS +,  no distension, tenderness, rebound or guarding.  Musculoskeletal: Normal range of motion. No edema and no tenderness.  Lymphadenopathy: No lymphadenopathy noted, cervical, inguinal or axillary Neuro: Alert. Normal reflexes, muscle tone coordination. No cranial nerve deficit. Skin: Skin is warm and dry. No rash noted. Not diaphoretic. No erythema. No pallor. Psychiatric: Normal mood and affect. Behavior, judgment, thought content normal.  Assessment & Plan:   1. Chronic migraine without aura without status migrainosus, not intractable Has not noticed any change in symptoms given she has been sick recently - topiramate (TOPAMAX) 100 MG tablet; Take 1 tablet (100 mg total) by mouth 2 (two) times daily.  Dispense: 60 tablet; Refill: 3  2. Benign paroxysmal positional vertigo, bilateral Remains on meclizine  3. Diarrhea, unspecified type Could be secondary to amoxicillin but we will check for possible infectious cause - C difficile Toxins A+B W/Rflx; Future  4. Acute ethmoidal sinusitis, recurrence not specified Advised  to complete course of amoxicillin Continue Proventil inhaler as well - cetirizine (ZYRTEC) 10 MG tablet; Take 1 tablet (10 mg total) by mouth daily.  Dispense: 30 tablet; Refill: 1   Meds ordered this encounter  Medications  . cetirizine (ZYRTEC) 10 MG tablet    Sig: Take 1 tablet (10 mg total) by mouth daily.    Dispense:  30 tablet    Refill:  1  . topiramate (TOPAMAX) 100 MG tablet    Sig: Take 1 tablet (100 mg total) by mouth 2 (two) times daily.    Dispense:  60 tablet    Refill:  3    Follow-up: Return in about 2 weeks (around 12/30/2015) for Follow-up on diarrhea.   Jaclyn Shaggy MD

## 2015-12-17 ENCOUNTER — Ambulatory Visit: Payer: Self-pay | Admitting: Family Medicine

## 2015-12-31 MED FILL — TOPIRAMATE 100 MG TABLET: 100 | 30 days supply | Qty: 60 | Fill #0 | Status: TO

## 2016-02-24 ENCOUNTER — Emergency Department (HOSPITAL_BASED_OUTPATIENT_CLINIC_OR_DEPARTMENT_OTHER)
Admission: EM | Admit: 2016-02-24 | Discharge: 2016-02-24 | Disposition: A | Discharge status: Home / Self Care | Payer: Self-pay | Attending: Physician Assistant | Admitting: Physician Assistant

## 2016-02-24 ENCOUNTER — Emergency Department (HOSPITAL_BASED_OUTPATIENT_CLINIC_OR_DEPARTMENT_OTHER): Payer: Self-pay

## 2016-02-24 ENCOUNTER — Encounter (HOSPITAL_BASED_OUTPATIENT_CLINIC_OR_DEPARTMENT_OTHER): Payer: Self-pay | Admitting: Emergency Medicine

## 2016-02-24 DIAGNOSIS — Z79899 Other long term (current) drug therapy: Secondary | ICD-10-CM | POA: Insufficient documentation

## 2016-02-24 DIAGNOSIS — R079 Chest pain, unspecified: Secondary | ICD-10-CM | POA: Insufficient documentation

## 2016-02-24 DIAGNOSIS — R5383 Other fatigue: Secondary | ICD-10-CM | POA: Insufficient documentation

## 2016-02-24 DIAGNOSIS — Z87891 Personal history of nicotine dependence: Secondary | ICD-10-CM | POA: Insufficient documentation

## 2016-02-24 DIAGNOSIS — R519 Headache, unspecified: Secondary | ICD-10-CM

## 2016-02-24 DIAGNOSIS — J3489 Other specified disorders of nose and nasal sinuses: Secondary | ICD-10-CM | POA: Insufficient documentation

## 2016-02-24 DIAGNOSIS — J302 Other seasonal allergic rhinitis: Secondary | ICD-10-CM | POA: Insufficient documentation

## 2016-02-24 DIAGNOSIS — R51 Headache: Secondary | ICD-10-CM | POA: Insufficient documentation

## 2016-02-24 DIAGNOSIS — R42 Dizziness and giddiness: Secondary | ICD-10-CM | POA: Insufficient documentation

## 2016-02-24 HISTORY — DX: Dizziness and giddiness: R42

## 2016-02-24 MED ORDER — IBUPROFEN 800 MG PO TABS
800.0000 mg | ORAL_TABLET | Freq: Once | ORAL | Status: AC
  Administered 2016-02-24: 800 mg via ORAL
  Filled 2016-02-24: qty 1

## 2016-02-24 MED ORDER — CETIRIZINE HCL 5 MG PO TABS: 5.0000 mg | ORAL_TABLET | Freq: Every day | ORAL | 0 refills | Status: DC

## 2016-02-24 NOTE — ED Provider Notes (Signed)
[facility] Provider Note   CSN: [id number] Arrival date & time: 02/24/16  4098  First Provider Contact:  First MD Initiated Contact with Patient 02/24/16 0750        History   Chief Complaint Chief Complaint  Patient presents with  . Dizziness  . Migraine    HPI Monique Tyler is a 50 y.o. female.  HPI  Patient is a 50 year old female with perceived GERD, migraine presenting today with 3 weeks of symptoms. 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 Since then she's been having occasional headaches and occasional dizziness occasional chest pains. This is been going on and off for the last 3 weeks. Not exertionally related. At work and then resolved when she gets home. She is worried that the mold is "hurting her". She also states that her "work smells bad".  Patient has not tried anything to help with symptoms.   Past Medical History:  Diagnosis Date  . GERD (gastroesophageal reflux disease)   . Kidney stones   . Lung nodules   . Migraine   . Vertigo     Patient Active Problem List   Diagnosis Date Noted  . Migraine 11/30/2015  . Benign paroxysmal positional vertigo 11/30/2015  . Headache, migraine 09/23/2014  . Ankle sprain 09/23/2014  . Contusion of right knee 09/23/2014  . Wrist sprain 09/23/2014  . Chest pain 02/17/2014  . Avitaminosis D 11/28/2011    Past Surgical History:  Procedure Laterality Date  . CESAREAN SECTION    . MANDIBLE SURGERY    . TUBAL LIGATION      OB History    Gravida Para Term Preterm AB Living   5 5           SAB TAB Ectopic Multiple Live Births         1         Home Medications    Prior to Admission medications   Medication Sig Start Date End Date Taking? Authorizing Provider  topiramate (TOPAMAX) 100 MG tablet Take 1 tablet (100 mg total) by mouth 2 (two) times daily. 12/16/15  Yes Jaclyn Shaggy, MD  albuterol (PROVENTIL HFA;VENTOLIN HFA) 108 (90 Base) MCG/ACT inhaler  Inhale 2 puffs into the lungs every 4 (four) hours as needed for wheezing or shortness of breath. 12/07/15   Tharon Aquas, PA  amoxicillin (AMOXIL) 500 MG capsule Take 1 capsule (500 mg total) by mouth 3 (three) times daily. Patient not taking: Reported on 12/16/2015 12/07/15   Tharon Aquas, PA  benzonatate (TESSALON) 100 MG capsule Take 1 capsule (100 mg total) by mouth every 8 (eight) hours. 12/07/15   Tharon Aquas, PA  cetirizine (ZYRTEC) 5 MG tablet Take 1 tablet (5 mg total) by mouth daily. 02/24/16   Jerrick Farve Lyn Yoali Conry, MD  HYDROcodone-homatropine (HYCODAN) 5-1.5 MG/5ML syrup Take 5 mLs by mouth every 6 (six) hours as needed for cough. Patient not taking: Reported on 12/16/2015 12/10/15   Gilda Crease, MD  meclizine (ANTIVERT) 25 MG tablet Take 1 tablet (25 mg total) by mouth 3 (three) times daily as needed for dizziness. 11/29/15   Deatra Canter, FNP  nystatin cream (MYCOSTATIN) Apply to affected area 2 times daily 12/12/15   Cheri Fowler, PA-C  ondansetron (ZOFRAN) 4 MG tablet Take 1 tablet (4 mg total) by mouth every 6 (six) hours. 12/12/15   Cheri Fowler, PA-C  oxyCODONE-acetaminophen (PERCOCET) 5-325 MG tablet Take 1 tablet  by mouth every 4 (four) hours as needed. Patient not taking: Reported on 12/12/2015 10/20/15   Danelle Berry, PA-C  predniSONE (DELTASONE) 20 MG tablet Take 3 tablets (60 mg total) by mouth daily with breakfast. Patient not taking: Reported on 12/16/2015 12/10/15   Gilda Crease, MD    Family History No family history on file.  Social History Social History  Substance Use Topics  . Smoking status: Former Smoker    Types: Cigarettes  . Smokeless tobacco: Never Used  . Alcohol use No     Allergies   Shellfish allergy and Pork-derived products   Review of Systems Review of Systems  Constitutional: Positive for fatigue. Negative for activity change and fever.  HENT: Positive for sinus pressure. Negative for congestion.   Eyes: Negative for  itching.  Respiratory: Negative for shortness of breath.   Cardiovascular: Positive for chest pain.  Gastrointestinal: Negative for abdominal pain.  Allergic/Immunologic: Positive for environmental allergies.  Neurological: Positive for dizziness, light-headedness and headaches.  All other systems reviewed and are negative.    Physical Exam Updated Vital Signs LMP 01/18/2012   Physical Exam  Constitutional: She appears well-developed and well-nourished. No distress.  Obese female  HENT:  Head: Normocephalic and atraumatic.  Right Ear: External ear normal.  Left Ear: External ear normal.  Mouth/Throat: Oropharynx is clear and moist. No oropharyngeal exudate.  Normal nasal turbinates. Bilateral TMs normal.  Eyes: Conjunctivae are normal.  Neck: Neck supple.  Cardiovascular: Normal rate and regular rhythm.   No murmur heard. Pulmonary/Chest: Effort normal and breath sounds normal. No respiratory distress.  Abdominal: Soft. There is no tenderness.  Musculoskeletal: She exhibits no edema.  Neurological: She is alert.  Skin: Skin is warm and dry.  Psychiatric: She has a normal mood and affect.  Nursing note and vitals reviewed.    ED Treatments / Results  Labs (all labs ordered are listed, but only abnormal results are displayed) Labs Reviewed - No data to display  EKG  EKG Interpretation  Date/Time:  Thursday February 24 2016 07:48:12 EDT Ventricular Rate:  62 PR Interval:    QRS Duration: 102 QT Interval:  427 QTC Calculation: 434 R Axis:   23 Text Interpretation:  Sinus rhythm Abnormal R-wave progression, early transition No significant change since last tracing Confirmed by Kandis Mannan ([id number]) on 02/24/2016 7:52:49 AM Also confirmed by Kandis Mannan ([id number]), editor [place] CT, Jola Babinski [phone])  on 02/24/2016 8:48:05 AM       Radiology Dg Chest 2 View  Result Date: 02/24/2016 CLINICAL DATA:  Mid chest pain, shortness of breath and dizziness off and on for 3  weeks, history lung nodules, kidney stones, GERD EXAM: CHEST  2 VIEW COMPARISON:  12/10/2015 FINDINGS: Normal heart size, mediastinal contours, and pulmonary vascularity. Mild chronic bronchitic changes. Lungs otherwise clear. No pleural effusion or pneumothorax. No definite pulmonary nodules visualized. Bones unremarkable. IMPRESSION: Mild chronic bronchitic changes without infiltrate. Electronically Signed   By: Ulyses Southward M.D.   On: 02/24/2016 08:28    Procedures Procedures (including critical care time)  Medications Ordered in ED Medications  ibuprofen (ADVIL,MOTRIN) tablet 800 mg (800 mg Oral Given 02/24/16 5038)     Initial Impression / Assessment and Plan / ED Course  I have reviewed the triage vital signs and the nursing notes.  Pertinent labs & imaging results that were available during my care of the patient were reviewed by me and considered in my medical decision making (see chart for details).  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 Patient's had low-grade symptoms for the last 3 weeks. I told her that she needs to approach her boss about it and discuss it or call the health department. She does not have any signs of infection here. We'll get a chest x-ray because she is worried that mold is effecting her lungs. However we recommend that she use allergy medications and ibuprofen and Tylenol.  Told her she could consider having fire department check for CO levels.   Final Clinical Impressions(s) / ED Diagnoses   Final diagnoses:  Bad headache    New Prescriptions Discharge Medication List as of 02/24/2016  9:39 AM       Claritza July Randall An, MD 02/24/16 1148

## 2016-02-24 NOTE — ED Notes (Signed)
MD at bedside. 

## 2016-02-24 NOTE — Discharge Instructions (Signed)
We do not see any infection from the mold you say is in your office. 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 Please discuss furthr with your PCP.

## 2016-02-24 NOTE — ED Triage Notes (Signed)
Pt reports ongoing chest pain, SOB,  dizziness and HA since starting a new job since July 1. Pt states she has mold at her job that is contributing to this. Pt states the dizziness is worse today than normal.

## 2016-04-05 ENCOUNTER — Encounter (HOSPITAL_COMMUNITY): Payer: Self-pay | Admitting: *Deleted

## 2016-04-05 ENCOUNTER — Emergency Department (HOSPITAL_COMMUNITY)
Admission: EM | Admit: 2016-04-05 | Discharge: 2016-04-05 | Disposition: A | Discharge status: Home / Self Care | Payer: No Typology Code available for payment source | Attending: Emergency Medicine | Admitting: Emergency Medicine

## 2016-04-05 ENCOUNTER — Emergency Department (HOSPITAL_COMMUNITY): Payer: No Typology Code available for payment source

## 2016-04-05 DIAGNOSIS — R51 Headache: Secondary | ICD-10-CM | POA: Diagnosis not present

## 2016-04-05 DIAGNOSIS — Y999 Unspecified external cause status: Secondary | ICD-10-CM | POA: Insufficient documentation

## 2016-04-05 DIAGNOSIS — Y92410 Unspecified street and highway as the place of occurrence of the external cause: Secondary | ICD-10-CM | POA: Diagnosis not present

## 2016-04-05 DIAGNOSIS — M542 Cervicalgia: Secondary | ICD-10-CM

## 2016-04-05 DIAGNOSIS — Z87891 Personal history of nicotine dependence: Secondary | ICD-10-CM | POA: Diagnosis not present

## 2016-04-05 DIAGNOSIS — Y939 Activity, unspecified: Secondary | ICD-10-CM | POA: Diagnosis not present

## 2016-04-05 DIAGNOSIS — S199XXA Unspecified injury of neck, initial encounter: Secondary | ICD-10-CM | POA: Insufficient documentation

## 2016-04-05 DIAGNOSIS — R079 Chest pain, unspecified: Secondary | ICD-10-CM | POA: Insufficient documentation

## 2016-04-05 DIAGNOSIS — I10 Essential (primary) hypertension: Secondary | ICD-10-CM | POA: Diagnosis not present

## 2016-04-05 DIAGNOSIS — M545 Low back pain: Secondary | ICD-10-CM | POA: Diagnosis not present

## 2016-04-05 DIAGNOSIS — M7918 Myalgia, other site: Secondary | ICD-10-CM

## 2016-04-05 HISTORY — DX: Essential (primary) hypertension: I10

## 2016-04-05 LAB — I-STAT BETA HCG BLOOD, ED (MC, WL, AP ONLY): I-stat hCG, quantitative: <5 m[IU]/mL (ref <5)

## 2016-04-05 MED ORDER — ONDANSETRON 4 MG PO TBDP
4.0000 mg | ORAL_TABLET | Freq: Once | ORAL | Status: AC
  Administered 2016-04-05: 4 mg via ORAL
  Filled 2016-04-05: qty 1

## 2016-04-05 MED ORDER — KETOROLAC TROMETHAMINE 30 MG/ML IJ SOLN
30.0000 mg | Freq: Once | INTRAMUSCULAR | Status: DC
Start: 2016-04-05 — End: 2016-04-05

## 2016-04-05 MED ORDER — ACETAMINOPHEN 325 MG PO TABS
650.0000 mg | ORAL_TABLET | Freq: Once | ORAL | Status: AC
  Administered 2016-04-05: 650 mg via ORAL
  Filled 2016-04-05: qty 2
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
## 2016-04-05 NOTE — ED Notes (Signed)
Patient at xray
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
## 2016-04-06 ENCOUNTER — Other Ambulatory Visit: Payer: Self-pay | Admitting: Pharmacist

## 2016-04-06 ENCOUNTER — Encounter: Payer: Self-pay | Admitting: Internal Medicine

## 2016-04-06 ENCOUNTER — Ambulatory Visit: Payer: No Typology Code available for payment source | Attending: Family Medicine | Admitting: Internal Medicine

## 2016-04-06 DIAGNOSIS — Z79899 Other long term (current) drug therapy: Secondary | ICD-10-CM | POA: Diagnosis not present

## 2016-04-06 DIAGNOSIS — I10 Essential (primary) hypertension: Secondary | ICD-10-CM | POA: Insufficient documentation

## 2016-04-06 DIAGNOSIS — T148 Other injury of unspecified body region: Secondary | ICD-10-CM | POA: Insufficient documentation

## 2016-04-06 DIAGNOSIS — Z87891 Personal history of nicotine dependence: Secondary | ICD-10-CM | POA: Diagnosis not present

## 2016-04-06 DIAGNOSIS — M542 Cervicalgia: Secondary | ICD-10-CM | POA: Diagnosis present

## 2016-04-06 DIAGNOSIS — T148XXA Other injury of unspecified body region, initial encounter: Secondary | ICD-10-CM

## 2016-04-06 DIAGNOSIS — S161XXA Strain of muscle, fascia and tendon at neck level, initial encounter: Secondary | ICD-10-CM

## 2016-04-06 DIAGNOSIS — Z91013 Allergy to seafood: Secondary | ICD-10-CM | POA: Insufficient documentation

## 2016-04-06 DIAGNOSIS — K219 Gastro-esophageal reflux disease without esophagitis: Secondary | ICD-10-CM | POA: Diagnosis not present

## 2016-04-06 MED ORDER — CYCLOBENZAPRINE HCL 10 MG PO TABS: 10.0000 mg | ORAL_TABLET | Freq: Three times a day (TID) | ORAL | 0 refills | Status: DC | PRN

## 2016-04-06 MED ORDER — NAPROXEN 500 MG PO TABS: 500.0000 mg | ORAL_TABLET | Freq: Two times a day (BID) | ORAL | 0 refills | Status: DC

## 2016-04-06 MED ORDER — KETOROLAC TROMETHAMINE 60 MG/2ML IM SOLN
60.0000 mg | Freq: Once | INTRAMUSCULAR | Status: AC
  Administered 2016-04-06: 60 mg via INTRAMUSCULAR

## 2016-04-06 MED FILL — NAPROXEN 500 MG TABLET: 500 | 30 days supply | Qty: 60 | Fill #0

## 2016-04-06 MED FILL — ?CYCLOBENZAPRINE 10 MG TABL: 10 | 10 days supply | Qty: 30 | Fill #0
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
## 2016-04-06 NOTE — Progress Notes (Signed)
Severe pain in neck, entire back which radiates to right hip. Left arm numb Hit head right side on grab bar

## 2016-04-06 NOTE — Patient Instructions (Signed)
Muscle Strain A muscle strain (pulled muscle) happens when a muscle is stretched beyond normal length. It happens when a sudden, violent force stretches your muscle too far. Usually, a few of the fibers in your muscle are torn. Muscle strain is common in athletes. Recovery usually takes 1-2 weeks. Complete healing takes 5-6 weeks.  HOME CARE   Follow the PRICE method of treatment to help your injury get better. Do this the first 2-3 days after the injury:  Protect. Protect the muscle to keep it from getting injured again.  Rest. Limit your activity and rest the injured body part.  Ice. Put ice in a plastic bag. Place a towel between your skin and the bag. Then, apply the ice and leave it on from 15-20 minutes each hour. After the third day, switch to moist heat packs.  Compression. Use a splint or elastic bandage on the injured area for comfort. Do not put it on too tightly.  Elevate. Keep the injured body part above the level of your heart.  Only take medicine as told by your doctor.  Warm up before doing exercise to prevent future muscle strains. GET HELP IF:   You have more pain or puffiness (swelling) in the injured area.  You feel numbness, tingling, or notice a loss of strength in the injured area. MAKE SURE YOU:   Understand these instructions.  Will watch your condition.  Will get help right away if you are not doing well or get worse.   This information is not intended to replace advice given to you by your health care provider. Make sure you discuss any questions you have with your health care provider.   Document Released: 04/18/2008 Document Revised: 04/30/2013 Document Reviewed: 02/06/2013 Elsevier Interactive Patient Education 2016 Elsevier Inc.   -  RICE for Routine Care of Injuries Many injuries can be cared for using rest, ice, compression, and elevation (RICE therapy). Using RICE therapy can help to lessen pain and swelling. It can help your body to  heal. Rest Reduce your normal activities and avoid using the injured part of your body. You can go back to your normal activities when you feel okay and your doctor says it is okay. Ice Do not put ice on your bare skin.  Put ice in a plastic bag.  Place a towel between your skin and the bag.  Leave the ice on for 20 minutes, 2-3 times a day. Do this for as long as told by your doctor. Compression Compression means putting pressure on the injured area. This can be done with an elastic bandage. If an elastic bandage has been applied:  Remove and reapply the bandage every 3-4 hours or as told by your doctor.  Make sure the bandage is not wrapped too tight. Wrap the bandage more loosely if part of your body beyond the bandage is blue, swollen, cold, painful, or loses feeling (numb).  See your doctor if the bandage seems to make your problems worse. Elevation Elevation means keeping the injured area raised. Raise the injured area above your heart or the center of your chest if you can. WHEN SHOULD I GET HELP? You should get help if:  You keep having pain and swelling.  Your symptoms get worse. WHEN SHOULD I GET HELP RIGHT AWAY? You should get help right away if:  You have sudden bad pain at or below the area of your injury.  You have redness or more swelling around your injury.  You have tingling  or numbness at or below the injury that does not go away when you take off the bandage.   This information is not intended to replace advice given to you by your health care provider. Make sure you discuss any questions you have with your health care provider.   Document Released: 12/27/2007 Document Revised: 03/31/2015 Document Reviewed: 06/17/2014 Elsevier Interactive Patient Education [employer]

## 2016-04-10 ENCOUNTER — Ambulatory Visit (HOSPITAL_COMMUNITY)
Admission: EM | Admit: 2016-04-10 | Discharge: 2016-04-10 | Disposition: A | Discharge status: Home / Self Care | Payer: Self-pay | Attending: Emergency Medicine | Admitting: Emergency Medicine

## 2016-04-10 ENCOUNTER — Encounter (HOSPITAL_COMMUNITY): Payer: Self-pay | Admitting: Emergency Medicine

## 2016-04-10 ENCOUNTER — Ambulatory Visit (INDEPENDENT_AMBULATORY_CARE_PROVIDER_SITE_OTHER): Payer: Self-pay

## 2016-04-10 DIAGNOSIS — M542 Cervicalgia: Secondary | ICD-10-CM

## 2016-04-10 DIAGNOSIS — M546 Pain in thoracic spine: Secondary | ICD-10-CM

## 2016-04-10 DIAGNOSIS — R200 Anesthesia of skin: Secondary | ICD-10-CM

## 2016-04-10 MED ORDER — HYDROCODONE-ACETAMINOPHEN 5-325 MG PO TABS: 2.0000 | ORAL_TABLET | ORAL | 0 refills | Status: DC | PRN

## 2016-04-10 MED ORDER — PREDNISONE 10 MG (21) PO TBPK: ORAL_TABLET | ORAL | 0 refills | Status: DC

## 2016-04-10 MED ORDER — METAXALONE 800 MG PO TABS: 800.0000 mg | ORAL_TABLET | Freq: Three times a day (TID) | ORAL | 0 refills | Status: DC

## 2016-04-10 NOTE — Discharge Instructions (Signed)
People tend to feel worse over the next several days, but most people are back to normal in 1 week. A small number of people will have persistent pain for up to six weeks. Take the naprosyn on a regular basis as directed. Drink plenty of water and take the medication with food to reduce the change of stomach irritation and to protect your kidneys. You may take up to 1 gram of tylenol 4 times a day. This with the naprosyn is an extremely effective combination for pain. Do not take the norco if you are taking the tylenol, as they both have tylenol in them, and too much can hurt your liver. each tablet of Norco has 325 mg of Tylenol in it. Do not exceed 4 grams of tylenol per day from all sources.    many people find gentle stretching and deep tissue massage helpful. Try [facility] on [address]. They have very reasonable prices and take walk ins. Or you can go to  [facility]. Follow-up with your primary care physician in several days, go to the ER for the signs and symptoms we discussed.
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
## 2016-05-07 ENCOUNTER — Other Ambulatory Visit: Payer: Self-pay | Admitting: Family Medicine

## 2016-05-07 DIAGNOSIS — G43709 Chronic migraine without aura, not intractable, without status migrainosus: Secondary | ICD-10-CM

## 2016-05-10 ENCOUNTER — Ambulatory Visit (HOSPITAL_COMMUNITY)
Admission: EM | Admit: 2016-05-10 | Discharge: 2016-05-10 | Disposition: A | Discharge status: Home / Self Care | Payer: Self-pay | Attending: Family Medicine | Admitting: Family Medicine

## 2016-05-10 ENCOUNTER — Encounter (HOSPITAL_COMMUNITY): Payer: Self-pay | Admitting: Family Medicine

## 2016-05-10 ENCOUNTER — Ambulatory Visit (INDEPENDENT_AMBULATORY_CARE_PROVIDER_SITE_OTHER): Payer: Self-pay

## 2016-05-10 DIAGNOSIS — R0789 Other chest pain: Secondary | ICD-10-CM

## 2016-05-10 DIAGNOSIS — F064 Anxiety disorder due to known physiological condition: Secondary | ICD-10-CM

## 2016-05-10 MED ORDER — DICLOFENAC POTASSIUM 50 MG PO TABS: 50.0000 mg | ORAL_TABLET | Freq: Three times a day (TID) | ORAL | 0 refills | Status: DC

## 2016-05-10 MED ORDER — LORAZEPAM 0.5 MG PO TABS: 0.5000 mg | ORAL_TABLET | Freq: Three times a day (TID) | ORAL | 0 refills | Status: DC

## 2016-05-10 NOTE — ED Triage Notes (Signed)
Pt here for increased BP, states she has been checking at [employer], headaches, heart palpitations, chest pain and SOB. sts she has been taking her muscle relaxer at night and waking up feeling this way.
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
## 2016-07-09 ENCOUNTER — Encounter (HOSPITAL_BASED_OUTPATIENT_CLINIC_OR_DEPARTMENT_OTHER): Payer: Self-pay | Admitting: *Deleted

## 2016-07-09 ENCOUNTER — Emergency Department (HOSPITAL_BASED_OUTPATIENT_CLINIC_OR_DEPARTMENT_OTHER): Payer: Self-pay

## 2016-07-09 ENCOUNTER — Emergency Department (HOSPITAL_BASED_OUTPATIENT_CLINIC_OR_DEPARTMENT_OTHER)
Admission: EM | Admit: 2016-07-09 | Discharge: 2016-07-09 | Disposition: A | Discharge status: Home / Self Care | Payer: Self-pay | Attending: Emergency Medicine | Admitting: Emergency Medicine

## 2016-07-09 DIAGNOSIS — R51 Headache: Secondary | ICD-10-CM

## 2016-07-09 DIAGNOSIS — R202 Paresthesia of skin: Secondary | ICD-10-CM | POA: Insufficient documentation

## 2016-07-09 DIAGNOSIS — Z79899 Other long term (current) drug therapy: Secondary | ICD-10-CM | POA: Insufficient documentation

## 2016-07-09 DIAGNOSIS — R519 Headache, unspecified: Secondary | ICD-10-CM

## 2016-07-09 DIAGNOSIS — I10 Essential (primary) hypertension: Secondary | ICD-10-CM | POA: Insufficient documentation

## 2016-07-09 DIAGNOSIS — Z87891 Personal history of nicotine dependence: Secondary | ICD-10-CM | POA: Insufficient documentation

## 2016-07-09 LAB — CBC WITH DIFFERENTIAL/PLATELET
Basophils Absolute: 0 10*3/uL (ref 0.0–0.1)
Basophils Relative: 1 %
Eosinophils Absolute: 0.1 10*3/uL (ref 0.0–0.7)
Eosinophils Relative: 2 %
HEMATOCRIT: 38.6 % (ref 36.0–46.0)
Hemoglobin: 11.9 g/dL — ABNORMAL LOW (ref 12.0–15.0)
LYMPHS PCT: 27 %
Lymphs Abs: 1.7 10*3/uL (ref 0.7–4.0)
MCH: 26.9 pg (ref 26.0–34.0)
MCHC: 30.8 g/dL (ref 30.0–36.0)
MCV: 87.1 fL (ref 78.0–100.0)
MONO ABS: 0.7 10*3/uL (ref 0.1–1.0)
MONOS PCT: 11 %
NEUTROS ABS: 3.6 10*3/uL (ref 1.7–7.7)
Neutrophils Relative %: 59 %
Platelets: 303 10*3/uL (ref 150–400)
RBC: 4.43 MIL/uL (ref 3.87–5.11)
RDW: 13.9 % (ref 11.5–15.5)
WBC: 6.1 10*3/uL (ref 4.0–10.5)

## 2016-07-09 LAB — BASIC METABOLIC PANEL
ANION GAP: 8 (ref 5–15)
BUN: 15 mg/dL (ref 6–20)
CALCIUM: 9.5 mg/dL (ref 8.9–10.3)
CO2: 23 mmol/L (ref 22–32)
Chloride: 109 mmol/L (ref 101–111)
Creatinine, Ser: 0.95 mg/dL (ref 0.44–1.00)
GFR calc Af Amer: >60 mL/min (ref >60)
GFR calc non Af Amer: >60 mL/min (ref >60)
GLUCOSE: 115 mg/dL — AB (ref 65–99)
POTASSIUM: 3.6 mmol/L (ref 3.5–5.1)
Sodium: 140 mmol/L (ref 135–145)

## 2016-07-09 NOTE — ED Notes (Signed)
EDP in to see pt prior to RN assessment, see MD notes, pending orders.  

## 2016-07-09 NOTE — ED Provider Notes (Signed)
[facility]   CSN: [id number] Arrival date & time: 07/09/16  0220     History   Chief Complaint Chief Complaint  Patient presents with  . Hypertension    HPI Monique Tyler is a 50 y.o. female.  Patient is a 50 year old female who presents with multiple complaints. She has a history of hypertension, migraines, GERD. He reports she has not felt well for the past several days, has felt numbness in her left arm and leg for the past 2 days, and woke this morning with headache, her heart racing, and feeling flushed. She believes her blood pressure is elevated and presents for evaluation of this. She denies any chest pain or shortness of breath. She denies any head injury or trauma. She tells me this feels different than her typical migraine headaches which she takes Topamax for.   The history is provided by the patient.  Hypertension  This is a new problem. The current episode started 2 days ago. The problem occurs constantly. The problem has been gradually worsening. Associated symptoms include headaches. Pertinent negatives include no chest pain. Nothing aggravates the symptoms. Nothing relieves the symptoms. She has tried nothing for the symptoms.    Past Medical History:  Diagnosis Date  . GERD (gastroesophageal reflux disease)   . Hypertension   . Kidney stones   . Lung nodules   . Migraine   . Vertigo     Patient Active Problem List   Diagnosis Date Noted  . Migraine 11/30/2015  . Benign paroxysmal positional vertigo 11/30/2015  . Headache, migraine 09/23/2014  . Ankle sprain 09/23/2014  . Contusion of right knee 09/23/2014  . Wrist sprain 09/23/2014  . Chest pain 02/17/2014  . Avitaminosis D 11/28/2011    Past Surgical History:  Procedure Laterality Date  . CESAREAN SECTION    . MANDIBLE SURGERY    . TUBAL LIGATION      OB History    Gravida Para Term Preterm AB Living   5 5           SAB TAB Ectopic Multiple Live Births   1         Home Medications    Prior to Admission medications   Medication Sig Start Date End Date Taking? Authorizing Provider  albuterol (PROVENTIL HFA;VENTOLIN HFA) 108 (90 Base) MCG/ACT inhaler Inhale 2 puffs into the lungs every 4 (four) hours as needed for wheezing or shortness of breath. 12/07/15   Tharon AquasFrank C Patrick, PA  cetirizine (ZYRTEC) 5 MG tablet Take 1 tablet (5 mg total) by mouth daily. 02/24/16   Courteney Lyn Mackuen, MD  diclofenac (CATAFLAM) 50 MG tablet Take 1 tablet (50 mg total) by mouth 3 (three) times daily. For chest pain 05/10/16   Linna HoffJames D Kindl, MD  LORazepam (ATIVAN) 0.5 MG tablet Take 1 tablet (0.5 mg total) by mouth every 8 (eight) hours. 05/10/16   Linna HoffJames D Kindl, MD  metaxalone (SKELAXIN) 800 MG tablet Take 1 tablet (800 mg total) by mouth 3 (three) times daily. 04/10/16   Domenick GongAshley Mortenson, MD  naproxen (NAPROSYN) 500 MG tablet Take 1 tablet (500 mg total) by mouth 2 (two) times daily with a meal. 04/06/16   Pete Glatterawn T Langeland, MD  topiramate (TOPAMAX) 100 MG tablet TAKE ONE TABLET BY MOUTH TWICE A DAY 05/08/16   Jaclyn ShaggyEnobong Amao, MD    Family History History reviewed. No pertinent family history.  Social History Social History  Substance Use Topics  . Smoking status: Former Smoker  Types: Cigarettes  . Smokeless tobacco: Never Used  . Alcohol use No     Allergies   Shellfish allergy and Pork-derived products   Review of Systems Review of Systems  Cardiovascular: Negative for chest pain.  Neurological: Positive for headaches.  All other systems reviewed and are negative.    Physical Exam Updated Vital Signs BP 145/93 (BP Location: Left Arm)   Pulse 84   Temp 98.1 F (36.7 C) (Oral)   Resp 20   Ht 5\' 7"  (1.702 m)   Wt 260 lb (117.9 kg)   LMP 01/18/2012   SpO2 100%   BMI 40.72 kg/m   Physical Exam  Constitutional: She is oriented to person, place, and time. She appears well-developed and well-nourished. No distress.  HENT:  Head:  Normocephalic and atraumatic.  Mouth/Throat: Oropharynx is clear and moist.  Eyes: EOM are normal. Pupils are equal, round, and reactive to light.  Neck: Normal range of motion. Neck supple.  Cardiovascular: Normal rate and regular rhythm.  Exam reveals no gallop and no friction rub.   No murmur heard. Pulmonary/Chest: Effort normal and breath sounds normal. No respiratory distress. She has no wheezes.  Abdominal: Soft. Bowel sounds are normal. She exhibits no distension. There is no tenderness.  Musculoskeletal: Normal range of motion.  Neurological: She is alert and oriented to person, place, and time. No cranial nerve deficit. She exhibits normal muscle tone. Coordination abnormal.  Skin: Skin is warm and dry. She is not diaphoretic.  Nursing note and vitals reviewed.    ED Treatments / Results  Labs (all labs ordered are listed, but only abnormal results are displayed) Labs Reviewed  BASIC METABOLIC PANEL  CBC WITH DIFFERENTIAL/PLATELET    EKG  EKG Interpretation None       Radiology No results found.  Procedures Procedures (including critical care time)  Medications Ordered in ED Medications - No data to display   Initial Impression / Assessment and Plan / ED Course  I have reviewed the triage vital signs and the nursing notes.  Pertinent labs & imaging results that were available during my care of the patient were reviewed by me and considered in my medical decision making (see chart for details).  Clinical Course     Patient presents with multiple complaints, none of which appear to have an emergent etiology. Head CT reveals no evidence for stroke, laboratory studies are unremarkable, and her blood pressure is normal. I see no indication for admission or further workup at this time. I will discharge her and advise her to follow-up with her primary Dr. this week if symptoms are not improving.  Final Clinical Impressions(s) / ED Diagnoses   Final diagnoses:    None    New Prescriptions New Prescriptions   No medications on file     Geoffery Lyonsouglas Mahamed Zalewski, MD 07/09/16 [phone]

## 2016-07-09 NOTE — Discharge Instructions (Signed)
Follow-up with your primary Dr. in the next 2-3 days if symptoms are not improving.  Continue your medications as previously prescribed.  Return to the ER symptoms significantly worsen or change.

## 2016-07-09 NOTE — ED Triage Notes (Signed)
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 Also c/o H/A and numbness in her left arm and leg. Denies other s/s. Neuro WNL. PERRL.

## 2016-08-24 ENCOUNTER — Emergency Department (HOSPITAL_BASED_OUTPATIENT_CLINIC_OR_DEPARTMENT_OTHER): Payer: Self-pay

## 2016-08-24 ENCOUNTER — Emergency Department (HOSPITAL_BASED_OUTPATIENT_CLINIC_OR_DEPARTMENT_OTHER)
Admission: EM | Admit: 2016-08-24 | Discharge: 2016-08-24 | Disposition: A | Discharge status: Home / Self Care | Payer: Self-pay | Attending: Emergency Medicine | Admitting: Emergency Medicine

## 2016-08-24 ENCOUNTER — Encounter (HOSPITAL_BASED_OUTPATIENT_CLINIC_OR_DEPARTMENT_OTHER): Payer: Self-pay | Admitting: *Deleted

## 2016-08-24 DIAGNOSIS — R0600 Dyspnea, unspecified: Secondary | ICD-10-CM | POA: Insufficient documentation

## 2016-08-24 DIAGNOSIS — Z9889 Other specified postprocedural states: Secondary | ICD-10-CM | POA: Insufficient documentation

## 2016-08-24 DIAGNOSIS — R05 Cough: Secondary | ICD-10-CM | POA: Insufficient documentation

## 2016-08-24 DIAGNOSIS — Z87891 Personal history of nicotine dependence: Secondary | ICD-10-CM | POA: Insufficient documentation

## 2016-08-24 DIAGNOSIS — R079 Chest pain, unspecified: Secondary | ICD-10-CM | POA: Insufficient documentation

## 2016-08-24 DIAGNOSIS — Z79899 Other long term (current) drug therapy: Secondary | ICD-10-CM | POA: Insufficient documentation

## 2016-08-24 DIAGNOSIS — I10 Essential (primary) hypertension: Secondary | ICD-10-CM | POA: Insufficient documentation

## 2016-08-24 LAB — TROPONIN I: Troponin I: <0.03 ng/mL (ref <0.03)

## 2016-08-24 LAB — BASIC METABOLIC PANEL
Anion gap: 7 (ref 5–15)
BUN: 12 mg/dL (ref 6–20)
CALCIUM: 9 mg/dL (ref 8.9–10.3)
CO2: 23 mmol/L (ref 22–32)
Chloride: 108 mmol/L (ref 101–111)
Creatinine, Ser: 0.98 mg/dL (ref 0.44–1.00)
GFR calc Af Amer: >60 mL/min (ref >60)
Glucose, Bld: 113 mg/dL — ABNORMAL HIGH (ref 65–99)
Potassium: 3.6 mmol/L (ref 3.5–5.1)
Sodium: 138 mmol/L (ref 135–145)

## 2016-08-24 LAB — CBC WITH DIFFERENTIAL/PLATELET
BASOS PCT: 0 %
Basophils Absolute: 0 10*3/uL (ref 0.0–0.1)
EOS PCT: 1 %
Eosinophils Absolute: 0.1 10*3/uL (ref 0.0–0.7)
HEMATOCRIT: 37.8 % (ref 36.0–46.0)
Hemoglobin: 11.8 g/dL — ABNORMAL LOW (ref 12.0–15.0)
LYMPHS PCT: 26 %
Lymphs Abs: 2.8 10*3/uL (ref 0.7–4.0)
MCH: 27.1 pg (ref 26.0–34.0)
MCHC: 31.2 g/dL (ref 30.0–36.0)
MCV: 86.9 fL (ref 78.0–100.0)
MONO ABS: 1.1 10*3/uL — AB (ref 0.1–1.0)
MONOS PCT: 10 %
Neutro Abs: 6.9 10*3/uL (ref 1.7–7.7)
Neutrophils Relative %: 63 %
PLATELETS: 311 10*3/uL (ref 150–400)
RBC: 4.35 MIL/uL (ref 3.87–5.11)
RDW: 13.4 % (ref 11.5–15.5)
WBC: 10.9 10*3/uL — ABNORMAL HIGH (ref 4.0–10.5)

## 2016-08-24 MED ORDER — IOPAMIDOL (ISOVUE-370) INJECTION 76%
100.0000 mL | Freq: Once | INTRAVENOUS | Status: AC | PRN
  Administered 2016-08-24: 100 mL via INTRAVENOUS

## 2016-08-24 NOTE — ED Provider Notes (Signed)
[facility]   CSN: [id number] Arrival date & time: 08/24/16  1351     History   Chief Complaint Chief Complaint  Patient presents with  . Shortness of Breath    HPI Monique Tyler is a 51 y.o. female.  Patient is a 51 year old female with history of migraines, GERD, vertigo, and obesity. She underwent rotator cuff repair of her right shoulder [facility]. She woke up this morning feeling short of breath, heavy in the chest, and feeling as if she was having difficulty breathing and swallowing. She apparently called her surgeon who told her to go to the hospital for further workup. She denies any fevers or chills. She does report chest congestion and cough.   The history is provided by the patient.  Shortness of Breath  This is a new problem. The average episode lasts 6 hours. The problem occurs continuously.The problem has been gradually worsening. Associated symptoms include cough and chest pain. Pertinent negatives include no fever and no sputum production. She has tried nothing for the symptoms. Associated medical issues do not include asthma or COPD.    Past Medical History:  Diagnosis Date  . GERD (gastroesophageal reflux disease)   . Hypertension   . Kidney stones   . Lung nodules   . Migraine   . Vertigo     Patient Active Problem List   Diagnosis Date Noted  . Migraine 11/30/2015  . Benign paroxysmal positional vertigo 11/30/2015  . Headache, migraine 09/23/2014  . Ankle sprain 09/23/2014  . Contusion of right knee 09/23/2014  . Wrist sprain 09/23/2014  . Chest pain 02/17/2014  . Avitaminosis D 11/28/2011    Past Surgical History:  Procedure Laterality Date  . CESAREAN SECTION    . MANDIBLE SURGERY    . TUBAL LIGATION      OB History    Gravida Para Term Preterm AB Living   5 5           SAB TAB Ectopic Multiple Live Births         1         Home Medications    Prior to Admission medications     Medication Sig Start Date End Date Taking? Authorizing Provider  albuterol (PROVENTIL HFA;VENTOLIN HFA) 108 (90 Base) MCG/ACT inhaler Inhale 2 puffs into the lungs every 4 (four) hours as needed for wheezing or shortness of breath. 12/07/15  Yes Tharon AquasFrank C Patrick, PA  cetirizine (ZYRTEC) 5 MG tablet Take 1 tablet (5 mg total) by mouth daily. 02/24/16  Yes Courteney Lyn Mackuen, MD  topiramate (TOPAMAX) 100 MG tablet TAKE ONE TABLET BY MOUTH TWICE A DAY 05/08/16  Yes Jaclyn ShaggyEnobong Amao, MD    Family History No family history on file.  Social History Social History  Substance Use Topics  . Smoking status: Former Smoker    Types: Cigarettes  . Smokeless tobacco: Never Used  . Alcohol use No     Allergies   Shellfish allergy and Pork-derived products   Review of Systems Review of Systems  Constitutional: Negative for fever.  Respiratory: Positive for cough and shortness of breath. Negative for sputum production.   Cardiovascular: Positive for chest pain.  All other systems reviewed and are negative.    Physical Exam Updated Vital Signs BP 146/75 (BP Location: Left Arm)   Pulse 70   Temp 98.3 F (36.8 C) (Oral)   Resp 20   Ht 5\' 7"  (1.702 m)   Wt 272 lb (  123.4 kg)   LMP 01/18/2012   SpO2 97%   BMI 42.60 kg/m   Physical Exam  Constitutional: She is oriented to person, place, and time. She appears well-developed and well-nourished. No distress.  HENT:  Head: Normocephalic and atraumatic.  Mouth/Throat: Oropharynx is clear and moist.  Neck: Normal range of motion. Neck supple.  Cardiovascular: Normal rate and regular rhythm.  Exam reveals no gallop and no friction rub.   No murmur heard. Pulmonary/Chest: Effort normal and breath sounds normal. No respiratory distress. She has no wheezes.  Abdominal: Soft. Bowel sounds are normal. She exhibits no distension. There is no tenderness.  Musculoskeletal: Normal range of motion. She exhibits no edema.  Neurological: She is alert and  oriented to person, place, and time.  Skin: Skin is warm and dry. She is not diaphoretic.  Nursing note and vitals reviewed.    ED Treatments / Results  Labs (all labs ordered are listed, but only abnormal results are displayed) Labs Reviewed  BASIC METABOLIC PANEL  CBC WITH DIFFERENTIAL/PLATELET  TROPONIN I    EKG  EKG Interpretation None       Radiology No results found.  Procedures Procedures (including critical care time)  Medications Ordered in ED Medications - No data to display   Initial Impression / Assessment and Plan / ED Course  I have reviewed the triage vital signs and the nursing notes.  Pertinent labs & imaging results that were available during my care of the patient were reviewed by me and considered in my medical decision making (see chart for details).  Patient one day status post rotator cuff repair. She woke this morning feeling short of breath and tight in the chest. She also reports soreness to her throat.  I spoke with the patient's surgeon Dr. Madilyn Fireman. We have decided to obtain a chest CT to rule out pneumothorax, pulmonary embolism, or other complication from anesthesia. This was performed and revealed no obvious abnormalities.  She has remained stable while in the emergency department. Her blood pressure and heart rate are normal. She is afebrile, and there is no hypoxia. Her lungs are clear. I suspect her symptoms may likely be related to soreness in her throat from management of her airway. There is no stridor and she is in no respiratory distress.  She will be discharged with continued pain medication and when necessary return.  Final Clinical Impressions(s) / ED Diagnoses   Final diagnoses:  None    New Prescriptions New Prescriptions   No medications on file     Geoffery Lyons, MD 08/24/16 [phone]

## 2016-08-24 NOTE — ED Triage Notes (Signed)
C/o shortness of breath and cough NP since [facility]. Pt states she is short of breath just setting still.

## 2016-08-24 NOTE — Discharge Instructions (Signed)
Continue your medications as before.  Return to the emergency department if you develop severe chest pain, worsening breathing, high fever, or other new and concerning symptoms.

## 2016-08-28 ENCOUNTER — Other Ambulatory Visit: Payer: Self-pay | Admitting: Family Medicine

## 2016-08-28 DIAGNOSIS — G43709 Chronic migraine without aura, not intractable, without status migrainosus: Secondary | ICD-10-CM

## 2016-10-24 ENCOUNTER — Other Ambulatory Visit: Payer: Self-pay | Admitting: Internal Medicine

## 2016-10-24 DIAGNOSIS — G43709 Chronic migraine without aura, not intractable, without status migrainosus: Secondary | ICD-10-CM

## 2016-12-21 ENCOUNTER — Ambulatory Visit (HOSPITAL_COMMUNITY)
Admission: EM | Admit: 2016-12-21 | Discharge: 2016-12-21 | Disposition: A | Discharge status: Home / Self Care | Payer: Self-pay | Attending: Internal Medicine | Admitting: Internal Medicine

## 2016-12-21 ENCOUNTER — Encounter (HOSPITAL_COMMUNITY): Payer: Self-pay | Admitting: *Deleted

## 2016-12-21 DIAGNOSIS — H1032 Unspecified acute conjunctivitis, left eye: Secondary | ICD-10-CM

## 2016-12-21 MED ORDER — ERYTHROMYCIN 5 MG/GM OP OINT: TOPICAL_OINTMENT | Freq: Four times a day (QID) | OPHTHALMIC | 0 refills | Status: DC

## 2016-12-21 NOTE — ED Triage Notes (Signed)
3 day history of left eye swelling and pain. Patient reports she wears contacts. Today she noticed more swelling. Reports blurred vision.

## 2016-12-21 NOTE — ED Provider Notes (Signed)
CSN: [id number]     Arrival date & time 12/21/16  1604 History   First MD Initiated Contact with Patient 12/21/16 1703     Chief Complaint  Patient presents with  . Eye Problem   (Consider location/radiation/quality/duration/timing/severity/associated sxs/prior Treatment) Subjective:  Monique Tyler is a 51 y.o. female who presents for evaluation of redness, foreign body sensation, itchiness and photophobia. She has noticed the above symptoms in the left eye for 2 days. Onset was gradual. Symptoms have included discharge, itching and tearing. Patient denies any fevers or recent URI. There is a history of contact lens use  The following portions of the patient's history were reviewed and updated as appropriate: allergies, current medications, past family history, past medical history, past social history, past surgical history and problem list.          Past Medical History:  Diagnosis Date  . GERD (gastroesophageal reflux disease)   . Hypertension   . Kidney stones   . Lung nodules   . Migraine   . Vertigo    Past Surgical History:  Procedure Laterality Date  . CESAREAN SECTION    . MANDIBLE SURGERY    . TUBAL LIGATION     History reviewed. No pertinent family history. Social History  Substance Use Topics  . Smoking status: Former Smoker    Types: Cigarettes  . Smokeless tobacco: Never Used  . Alcohol use No   OB History    Gravida Para Term Preterm AB Living   5 5           SAB TAB Ectopic Multiple Live Births         1       Review of Systems  Allergies  Shellfish allergy and Pork-derived products  Home Medications   Prior to Admission medications   Medication Sig Start Date End Date Taking? Authorizing Provider  topiramate (TOPAMAX) 100 MG tablet TAKE ONE TABLET BY MOUTH TWICE A DAY 10/25/16  Yes Langeland, Dawn T, MD  albuterol (PROVENTIL HFA;VENTOLIN HFA) 108 (90 Base) MCG/ACT inhaler Inhale 2 puffs into the lungs every 4 (four) hours as needed for  wheezing or shortness of breath. 12/07/15   Tharon Aquas, PA  cetirizine (ZYRTEC) 5 MG tablet Take 1 tablet (5 mg total) by mouth daily. 02/24/16   Mackuen, Courteney Lyn, MD  erythromycin ophthalmic ointment Place into the left eye 4 (four) times daily. Place a 1/2 inch ribbon of ointment into the lower right eyelid 12/21/16   Lurline Idol, FNP   Meds Ordered and Administered this Visit  Medications - No data to display  LMP 01/18/2012  No data found.   Physical Exam  Constitutional: She is oriented to person, place, and time. She appears well-developed and well-nourished.  HENT:  Head: Normocephalic.  Eyes: EOM are normal. Pupils are equal, round, and reactive to light. Left eye exhibits discharge. No scleral icterus.  Neck: Normal range of motion. Neck supple.  Cardiovascular: Normal rate and regular rhythm.   Pulmonary/Chest: Effort normal and breath sounds normal.  Abdominal: Soft. Bowel sounds are normal.  Musculoskeletal: Normal range of motion.  Neurological: She is alert and oriented to person, place, and time.  Skin: Skin is warm and dry.    Urgent Care Course     Procedures (including critical care time)  Labs Review Labs Reviewed - No data to display  Imaging Review No results found.   Visual Acuity Review  Right Eye Distance: 20/50 Left Eye Distance: 20/70 Bilateral Distance:  20/40 (pt is not wearing her corrective lens)  Right Eye Near:   Left Eye Near:    Bilateral Near:         MDM   1. Acute bacterial conjunctivitis of left eye   Discussed the diagnosis and proper care of conjunctivitis.   Stressed household hygiene and hand washing.  Ophthalmic ointment per orders. Warm compress to eye.  Analgesics as needed. FU with PCP in 1 week or PRN.  Discussed diagnosis and treatment with patient. All questions have been answered and all concerns have been addressed. The patient verbalized understanding and had no further questions      Lurline IdolMurrill, Hanzel Pizzo, FNP 12/21/16 1729    Lurline IdolMurrill, Shawntay Prest, [place]NP 01/15/17 2032

## 2017-01-26 ENCOUNTER — Ambulatory Visit: Payer: Self-pay | Attending: Family Medicine | Admitting: Physician Assistant

## 2017-01-26 VITALS — BP 140/90 | HR 73 | Temp 98.1°F | Ht 67.0 in | Wt 264.0 lb

## 2017-01-26 DIAGNOSIS — H579 Unspecified disorder of eye and adnexa: Secondary | ICD-10-CM

## 2017-01-26 DIAGNOSIS — K219 Gastro-esophageal reflux disease without esophagitis: Secondary | ICD-10-CM | POA: Insufficient documentation

## 2017-01-26 DIAGNOSIS — H578 Other specified disorders of eye and adnexa: Secondary | ICD-10-CM | POA: Insufficient documentation

## 2017-01-26 DIAGNOSIS — M25511 Pain in right shoulder: Secondary | ICD-10-CM

## 2017-01-26 DIAGNOSIS — G43909 Migraine, unspecified, not intractable, without status migrainosus: Secondary | ICD-10-CM

## 2017-01-26 DIAGNOSIS — R030 Elevated blood-pressure reading, without diagnosis of hypertension: Secondary | ICD-10-CM

## 2017-01-26 DIAGNOSIS — I10 Essential (primary) hypertension: Secondary | ICD-10-CM | POA: Insufficient documentation

## 2017-01-26 DIAGNOSIS — G43709 Chronic migraine without aura, not intractable, without status migrainosus: Secondary | ICD-10-CM

## 2017-01-26 MED ORDER — TRAMADOL HCL 50 MG PO TABS: 50.0000 mg | ORAL_TABLET | Freq: Four times a day (QID) | ORAL | 1 refills | Status: DC | PRN

## 2017-01-26 MED ORDER — METHOCARBAMOL 500 MG PO TABS: 500.0000 mg | ORAL_TABLET | Freq: Four times a day (QID) | ORAL | 1 refills | Status: DC

## 2017-01-26 MED ORDER — TOPIRAMATE 100 MG PO TABS: ORAL_TABLET | ORAL | 3 refills | Status: DC

## 2017-01-26 NOTE — Patient Instructions (Signed)
Call [facility] today and inquire about an appt
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
## 2017-01-30 ENCOUNTER — Ambulatory Visit: Payer: Self-pay | Attending: Physician Assistant | Admitting: Physical Therapy

## 2017-01-30 ENCOUNTER — Encounter: Payer: Self-pay | Admitting: Physical Therapy

## 2017-01-30 DIAGNOSIS — M6281 Muscle weakness (generalized): Secondary | ICD-10-CM

## 2017-01-30 DIAGNOSIS — M25511 Pain in right shoulder: Secondary | ICD-10-CM | POA: Insufficient documentation

## 2017-01-30 DIAGNOSIS — G8929 Other chronic pain: Secondary | ICD-10-CM

## 2017-01-30 DIAGNOSIS — M25512 Pain in left shoulder: Secondary | ICD-10-CM

## 2017-01-30 DIAGNOSIS — M542 Cervicalgia: Secondary | ICD-10-CM
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
## 2017-02-06 ENCOUNTER — Encounter: Payer: Self-pay | Admitting: Physical Therapy

## 2017-02-06 ENCOUNTER — Ambulatory Visit: Payer: Self-pay | Admitting: Physical Therapy

## 2017-02-06 DIAGNOSIS — M25511 Pain in right shoulder: Principal | ICD-10-CM

## 2017-02-06 DIAGNOSIS — M25512 Pain in left shoulder: Secondary | ICD-10-CM

## 2017-02-06 DIAGNOSIS — M6281 Muscle weakness (generalized): Secondary | ICD-10-CM

## 2017-02-06 DIAGNOSIS — G8929 Other chronic pain: Secondary | ICD-10-CM

## 2017-02-06 DIAGNOSIS — M542 Cervicalgia: Secondary | ICD-10-CM

## 2017-02-06 NOTE — Patient Instructions (Signed)
Issued from Exercises drawer Self snag for HA Posture ED work station set up and sitting posture HA

## 2017-02-06 NOTE — Therapy (Signed)
[facility] [address] [place], [place], [address] Phone: [phone]   Fax:  [phone]  Physical Therapy Treatment  Patient Details  Name: Monique DupreBeth Tyler MRN: [id number] Date of Birth: [birth year] Referring Provider: Vivianne MasterNoel, Tiffany S, PA-C  Encounter Date: 02/06/2017      PT End of Session - 02/06/17 0825    Visit Number 2   Number of Visits 17   Date for PT Re-Evaluation 03/30/17   PT Start Time 0732   PT Stop Time 0825   PT Time Calculation (min) 53 min   Activity Tolerance Patient tolerated treatment well   Behavior During Therapy [facility] for tasks assessed/performed      Past Medical History:  Diagnosis Date  . GERD (gastroesophageal reflux disease)   . Hypertension   . Kidney stones   . Lung nodules   . Migraine   . Vertigo     Past Surgical History:  Procedure Laterality Date  . CESAREAN SECTION    . MANDIBLE SURGERY    . TUBAL LIGATION      There were no vitals filed for this visit.      Subjective Assessment - 02/06/17 0738    Subjective Had a severe HA after last visit. It started the next day and it lasted 2 days.  Medication did not help.  She has a lot of stress at work.     Currently in Pain? Yes   Pain Score 8    Pain Location Shoulder   Pain Orientation Right   Pain Descriptors / Indicators Sharp;Heaviness   Pain Type Chronic pain   Pain Frequency Constant   Aggravating Factors  reaching lifting   Pain Relieving Factors pillow under arm.  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  need of frequent rests     Modalities   Modalities Moist Heat     Moist Heat Therapy   Number Minutes Moist Heat 15 Minutes   Moist Heat Location  Cervical;Shoulder  right in sidelying     Manual Therapy   Manual Therapy Soft tissue mobilization   Soft tissue mobilization right upper quadrant.  some tension and tightness loosened,  SCM and scaleens strummed     Neck Exercises: Stretches   Other Neck Stretches Self snag , cervical flexion fist on chest and chin.  HEP                PT Education - 02/06/17 0824    Education provided Yes   Education Details EX and SELF care   Person(s) Educated Patient   Methods Explanation;Demonstration;Tactile cues;Verbal cues   Comprehension Verbalized understanding;Returned demonstration          PT Short Term Goals - 02/06/17 [id number]      PT SHORT TERM GOAL #1   Title GHJ AROM to 90 deg flexion & abd by 8/10   Period Weeks   Status Unable to assess     PT SHORT TERM GOAL #2   Title Decr resting pain to 5/10   Baseline 6/10 post session   Time 4   Period Weeks  Status On-going           PT Long Term Goals - 01/30/17 1120      PT LONG TERM GOAL #1   Title FOTO to 41% limitation to indicate significant improvement in functional ability by 9/7   Baseline 68% limitaiton at eval   Time 8   Period Weeks   Status New     PT LONG TERM GOAL #2   Title 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 6/10. Posture ed continued.  progress toward pain goal.   PT Treatment/Interventions ADLs/Self Care  Home Management;Cryotherapy;Electrical Stimulation;Iontophoresis 4mg /ml Dexamethasone;Functional mobility training;Ultrasound;Traction;Moist Heat;Therapeutic activities;Therapeutic exercise;Neuromuscular re-education;Patient/family education;Passive range of motion;Scar mobilization;Manual techniques;Dry needling;Taping   PT Next Visit Plan DN- upper traps, scalenes.  Consider retraction exercises.   PT Home Exercise Plan take 30 min breaks on computer, supine AAROM flx, theracane   Consulted and Agree with Plan of Care Patient      Patient will benefit from skilled therapeutic intervention in order to improve the following deficits and impairments:  Decreased range of motion, Impaired UE functional use, Increased muscle spasms, Decreased activity tolerance, Pain, Improper body mechanics, Decreased strength, Decreased mobility, Postural dysfunction, Impaired sensation  Visit Diagnosis: Chronic right shoulder pain  Cervicalgia  Acute pain of left shoulder  Muscle weakness (generalized)     Problem List Patient Active Problem List   Diagnosis Date Noted  . Migraine 11/30/2015  . Benign paroxysmal positional vertigo 11/30/2015  . Headache, migraine 09/23/2014  . Ankle sprain 09/23/2014  . Contusion of right knee 09/23/2014  . Wrist sprain 09/23/2014  . Chest pain 02/17/2014  . Avitaminosis D 11/28/2011    Kathyann Spaugh PTA 02/06/2017, 8:31 AM  [facility] [address] [place], [place], [address] Phone: [phone]   Fax:  [phone]  Name: Monique Tyler MRN: [id number] Date of Birth: [birth year]

## 2017-02-12 ENCOUNTER — Encounter: Payer: Self-pay | Admitting: Physical Therapy

## 2017-02-14 ENCOUNTER — Ambulatory Visit: Payer: Self-pay | Admitting: Physical Therapy

## 2017-02-14 ENCOUNTER — Encounter: Payer: Self-pay | Admitting: Physical Therapy

## 2017-02-14 DIAGNOSIS — M25512 Pain in left shoulder: Secondary | ICD-10-CM

## 2017-02-14 DIAGNOSIS — G8929 Other chronic pain: Secondary | ICD-10-CM

## 2017-02-14 DIAGNOSIS — M6281 Muscle weakness (generalized): Secondary | ICD-10-CM

## 2017-02-14 DIAGNOSIS — M542 Cervicalgia: Secondary | ICD-10-CM

## 2017-02-14 DIAGNOSIS — M25511 Pain in right shoulder: Principal | ICD-10-CM

## 2017-02-14 NOTE — Therapy (Addendum)
[place], [place], [address] Phone: [phone]   Fax:  [phone]  Physical Therapy Treatment/Discharge Summary  Patient Details  Name: Piera Downs MRN: [id number] Date of Birth: January 08, 1966 Referring Provider: Brayton Caves, PA-C  Encounter Date: 02/14/2017      PT End of Session - 02/14/17 0806    Visit Number 3   Number of Visits 17   Date for PT Re-Evaluation 03/30/17   Authorization Type self pay   PT Start Time 0802   PT Stop Time 0855   PT Time Calculation (min) 53 min   Activity Tolerance Patient tolerated treatment well   Behavior During Therapy [facility] for tasks assessed/performed      Past Medical History:  Diagnosis Date  . GERD (gastroesophageal reflux disease)   . Hypertension   . Kidney stones   . Lung nodules   . Migraine   . Vertigo     Past Surgical History:  Procedure Laterality Date  . CESAREAN SECTION    . MANDIBLE SURGERY    . TUBAL LIGATION      There were no vitals filed for this visit.      Subjective Assessment - 02/14/17 0803    Subjective I feel a little better. I can move a little better. Used towel under neck and pillow bw knees at night which helped her sleep. Noticing some chest pain. More rest breaks and going outside for fresh air to decrease stress.    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 Yes   Pain Score 7    Pain Location Shoulder   Pain Orientation Right   Pain Descriptors / Indicators Sharp                         [facility] Adult PT Treatment/Exercise - 02/14/17 0001      Shoulder Exercises: Seated   Retraction 10 reps  scapular retraction in chair   External Rotation 20 reps   Theraband Level (Shoulder External Rotation) Level 1 (Yellow)   Other Seated Exercises cervical retraction     Shoulder Exercises: Standing   Flexion 10 reps  1# reach to low shelf, PT hold upper trap    Row 20 reps   Theraband Level (Shoulder Row) Level 3 (Green)     Shoulder Exercises: Pulleys   Flexion 3 minutes     Shoulder Exercises: Stretch   Other Shoulder Stretches door pec stretch     Moist Heat Therapy   Number Minutes Moist Heat 10 Minutes   Moist Heat Location Cervical  prone     Manual Therapy   Manual Therapy Joint mobilization   Joint Mobilization prone rib ER, thoracic PA   Soft tissue mobilization R upper trap- STM, trigger point release, stretching     Neck Exercises: Stretches   Other Neck Stretches L rotation snag                PT Education - 02/14/17 0848    Education provided Yes   Education Details exercise form/rationale, pain patterns with tightness, importance of regular movement, centralization   Person(s) Educated Patient   Methods Explanation;Demonstration;Tactile cues;Verbal cues;Handout   Comprehension Verbalized understanding;Returned demonstration;Verbal cues required;Tactile cues required;Need further instruction          PT Short Term Goals - 02/06/17 0828      PT SHORT TERM GOAL #1   Title GHJ  AROM to 90 deg flexion & abd by 8/10   Period Weeks   Status Unable to assess     PT SHORT TERM GOAL #2   Title Decr resting pain to 5/10   Baseline 6/10 post session   Time 4   Period Weeks   Status On-going           PT Long Term Goals - 01/30/17 1120      PT LONG TERM GOAL #1   Title FOTO to 41% limitation to indicate significant improvement in functional ability by 9/7   Baseline 68% limitaiton at eval   Time 8   Period Weeks   Status New     PT LONG TERM GOAL #2   Title 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, community due to pain   Time 8   Period Weeks   Status New     PT LONG TERM GOAL #3   Title Pt will be able to use R arm for self care and dressing pain <=3/10   Baseline unable at eval   Time 8   Period Weeks   Status New     PT LONG TERM GOAL #4    Title Pt will be able to carry her purse and grocery bags without limitation by shoulder pain   Baseline unable to carry in R hand at eval   Time 8   Period Weeks   Status New               Plan - 02/14/17 1105    Clinical Impression Statement Reported upper trap felt better following treatment and discussed importance of regular movmeent and posture to maintain gains. Educated on chest pain caused by shortened pecs, esp being on a computer. Also educated on s/s of heart attack should she need to go to the ER.    PT Treatment/Interventions ADLs/Self Care Home Management;Cryotherapy;Electrical Stimulation;Iontophoresis 22m/ml Dexamethasone;Functional mobility training;Ultrasound;Traction;Moist Heat;Therapeutic activities;Therapeutic exercise;Neuromuscular re-education;Patient/family education;Passive range of motion;Scar mobilization;Manual techniques;Dry needling;Taping   PT Next Visit Plan stretching, periscapular strengthening   PT Home Exercise Plan take 30 min breaks on computer, supine AAROM flx, theracane; cervical & scapular retraction, UE ER red tband, door pec stretch;    Consulted and Agree with Plan of Care Patient      Patient will benefit from skilled therapeutic intervention in order to improve the following deficits and impairments:  Decreased range of motion, Impaired UE functional use, Increased muscle spasms, Decreased activity tolerance, Pain, Improper body mechanics, Decreased strength, Decreased mobility, Postural dysfunction, Impaired sensation  Visit Diagnosis: Chronic right shoulder pain  Cervicalgia  Acute pain of left shoulder  Muscle weakness (generalized)     Problem List Patient Active Problem List   Diagnosis Date Noted  . Migraine 11/30/2015  . Benign paroxysmal positional vertigo 11/30/2015  . Headache, migraine 09/23/2014  . Ankle sprain 09/23/2014  . Contusion of right knee 09/23/2014  . Wrist sprain 09/23/2014  . Chest pain  02/17/2014  . Avitaminosis D 11/28/2011    Karli Wickizer C. Aurora Rody PT, DPT 02/14/17 11:10 AM   [facility] [place] [address]Phone: [phone]  Fax:  [phone] Name: BMarleah BeeverMRN: [id number] of Birth: [birth year] PHYSICAL THERAPY DISCHARGE SUMMARY  Visits from Start of Care: 3  Current functional level related to goals / functional outcomes: See above   Remaining  deficits: See above   Education / Equipment: Anatomy of condition, POC, HEP, exercise form/rationala  Plan: Patient agrees to discharge.  Patient goals were not met. Patient is being discharged due to financial reasons.  ?????    Dontavis Tschantz C. Shiheem Corporan PT, DPT 03/19/17 9:48 AM

## 2017-02-19 ENCOUNTER — Encounter: Payer: Self-pay | Admitting: Physical Therapy

## 2017-02-19 ENCOUNTER — Telehealth: Payer: Self-pay | Admitting: Physical Therapy

## 2017-02-19 ENCOUNTER — Ambulatory Visit: Payer: Self-pay | Admitting: Physical Therapy

## 2017-02-19 NOTE — Telephone Encounter (Signed)
Left message on voice mail about missed visit today. Reminded of her next appointment 02/21/2017 with Army FossaJessica Hightower at 8:45.  I asked her to call if she was unable to attend.  Liz BeachKaren Harris PTA.

## 2017-02-21 ENCOUNTER — Encounter: Payer: Self-pay | Admitting: Physical Therapy

## 2017-02-26 ENCOUNTER — Encounter: Payer: Self-pay | Admitting: Physical Therapy

## 2017-02-28 ENCOUNTER — Ambulatory Visit: Payer: Self-pay | Attending: Family Medicine | Admitting: Family Medicine

## 2017-02-28 ENCOUNTER — Encounter: Payer: Self-pay | Admitting: Family Medicine

## 2017-02-28 VITALS — BP 130/82 | HR 80 | Temp 98.0°F | Ht 67.0 in | Wt 259.6 lb

## 2017-02-28 DIAGNOSIS — Z1159 Encounter for screening for other viral diseases: Secondary | ICD-10-CM

## 2017-02-28 DIAGNOSIS — Z114 Encounter for screening for human immunodeficiency virus [HIV]: Secondary | ICD-10-CM | POA: Insufficient documentation

## 2017-02-28 DIAGNOSIS — J302 Other seasonal allergic rhinitis: Secondary | ICD-10-CM | POA: Insufficient documentation

## 2017-02-28 DIAGNOSIS — Z79899 Other long term (current) drug therapy: Secondary | ICD-10-CM | POA: Insufficient documentation

## 2017-02-28 DIAGNOSIS — J3089 Other allergic rhinitis: Secondary | ICD-10-CM

## 2017-02-28 DIAGNOSIS — Z23 Encounter for immunization: Secondary | ICD-10-CM

## 2017-02-28 DIAGNOSIS — I10 Essential (primary) hypertension: Secondary | ICD-10-CM | POA: Insufficient documentation

## 2017-02-28 DIAGNOSIS — Z13228 Encounter for screening for other metabolic disorders: Secondary | ICD-10-CM

## 2017-02-28 DIAGNOSIS — Z9889 Other specified postprocedural states: Secondary | ICD-10-CM

## 2017-02-28 DIAGNOSIS — K219 Gastro-esophageal reflux disease without esophagitis: Secondary | ICD-10-CM | POA: Insufficient documentation

## 2017-02-28 DIAGNOSIS — G43709 Chronic migraine without aura, not intractable, without status migrainosus: Secondary | ICD-10-CM

## 2017-02-28 DIAGNOSIS — M25511 Pain in right shoulder: Secondary | ICD-10-CM | POA: Insufficient documentation

## 2017-02-28 DIAGNOSIS — E889 Metabolic disorder, unspecified: Secondary | ICD-10-CM | POA: Insufficient documentation

## 2017-02-28 MED ORDER — CETIRIZINE HCL 10 MG PO TABS: 10.0000 mg | ORAL_TABLET | Freq: Every day | ORAL | 3 refills | Status: DC

## 2017-02-28 MED ORDER — TOPIRAMATE 100 MG PO TABS: ORAL_TABLET | ORAL | 3 refills | Status: DC

## 2017-02-28 MED ORDER — ALBUTEROL SULFATE HFA 108 (90 BASE) MCG/ACT IN AERS: 2.0000 | INHALATION_SPRAY | RESPIRATORY_TRACT | 1 refills | Status: DC | PRN

## 2017-02-28 MED ORDER — METHOCARBAMOL 500 MG PO TABS: 500.0000 mg | ORAL_TABLET | Freq: Four times a day (QID) | ORAL | 2 refills | Status: DC

## 2017-02-28 NOTE — Progress Notes (Signed)
 Subjective:  Patient ID: Monique Tyler, female    DOB: 06/29/1966  Age: 51 y.o. MRN: [id number]  CC: Follow-up   HPI Monique Tyler is a 51-year-old right handed woman with a history of chronic migraines, right shoulder pain status post rotator cuff surgery in 07/2016, seasonal allergies who presents today for follow-up visit.  She has undergone physical therapy ever since the surgery but continues to have right shoulder pain for which she takes Robaxin and tramadol. Pain prevents her from lifting her hand over her head. She would like a referral back to see her surgeon but is unable to recall the name of the surgeon.  Her migraines have been controlled and she has 1-2 episodes per month. Denies blurry vision, nausea or vomiting. Seasonal allergy symptoms are controlled.  Past Medical History:  Diagnosis Date  . GERD (gastroesophageal reflux disease)   . Hypertension   . Kidney stones   . Lung nodules   . Migraine   . Vertigo     Past Surgical History:  Procedure Laterality Date  . CESAREAN SECTION    . MANDIBLE SURGERY    . TUBAL LIGATION      Allergies  Allergen Reactions  . Shellfish Allergy Swelling  . Pork-Derived Products Nausea And Vomiting     Outpatient Medications Prior to Visit  Medication Sig Dispense Refill  . traMADol (ULTRAM) 50 MG tablet Take 1 tablet (50 mg total) by mouth every 6 (six) hours as needed. 30 tablet 1  . albuterol (PROVENTIL HFA;VENTOLIN HFA) 108 (90 Base) MCG/ACT inhaler Inhale 2 puffs into the lungs every 4 (four) hours as needed for wheezing or shortness of breath. 1 Inhaler 0  . cetirizine (ZYRTEC) 5 MG tablet Take 1 tablet (5 mg total) by mouth daily. 30 tablet 0  . methocarbamol (ROBAXIN) 500 MG tablet Take 1 tablet (500 mg total) by mouth 4 (four) times daily. 60 tablet 1  . topiramate (TOPAMAX) 100 MG tablet 200 mg in am 100 mg in pm  #45 3 RF 45 tablet 3  . erythromycin ophthalmic ointment Place into the left eye 4 (four) times  daily. Place a 1/2 inch ribbon of ointment into the lower right eyelid (Patient not taking: Reported on 01/26/2017) 1 g 0   No facility-administered medications prior to visit.     ROS Review of Systems  Constitutional: Negative for activity change, appetite change and fatigue.  HENT: Negative for congestion, sinus pressure and sore throat.   Eyes: Negative for visual disturbance.  Respiratory: Negative for cough, chest tightness, shortness of breath and wheezing.   Cardiovascular: Negative for chest pain and palpitations.  Gastrointestinal: Negative for abdominal distention, abdominal pain and constipation.  Endocrine: Negative for polydipsia.  Genitourinary: Negative for dysuria and frequency.  Musculoskeletal:       See hpi  Skin: Negative for rash.  Neurological: Negative for tremors, light-headedness and numbness.  Hematological: Does not bruise/bleed easily.  Psychiatric/Behavioral: Negative for agitation and behavioral problems.    Objective:  BP 130/82   Pulse 80   Temp 98 F (36.7 C) (Oral)   Ht 5' 7" (1.702 m)   Wt 259 lb 9.6 oz (117.8 kg)   LMP 01/18/2012   SpO2 100%   BMI 40.66 kg/m   BP/Weight 02/28/2017 01/26/2017 08/24/2016  Systolic BP 130 140 133  Diastolic BP 82 90 84  Wt. (Lbs) 259.6 264 272  BMI 40.66 41.35 42.6      Physical Exam  Constitutional: She is oriented to   person, place, and time. She appears well-developed and well-nourished.  Cardiovascular: Normal rate, normal heart sounds and intact distal pulses.   No murmur heard. Pulmonary/Chest: Effort normal and breath sounds normal. She has no wheezes. She has no rales. She exhibits no tenderness.  Abdominal: Soft. Bowel sounds are normal. She exhibits no distension and no mass. There is no tenderness.  Musculoskeletal: She exhibits tenderness (Passive abduction of right shoulder limited to 90 and associated with tenderness). She exhibits no edema.  Neurological: She is alert and oriented to person,  place, and time.     Assessment & Plan:   1. Chronic migraine without aura without status migrainosus, not intractable Controlled - topiramate (TOPAMAX) 100 MG tablet; 200 mg in am 100 mg in pm  Dispense: 45 tablet; Refill: 3  2. Status post right rotator cuff repair Pain still persists despite physical therapy She will be calling the office back with the name of her orthopedic for referral - methocarbamol (ROBAXIN) 500 MG tablet; Take 1 tablet (500 mg total) by mouth 4 (four) times daily.  Dispense: 60 tablet; Refill: 2  3. Seasonal allergic rhinitis due to other allergic trigger Controlled - cetirizine (ZYRTEC) 10 MG tablet; Take 1 tablet (10 mg total) by mouth daily.  Dispense: 30 tablet; Refill: 3 - albuterol (PROVENTIL HFA;VENTOLIN HFA) 108 (90 Base) MCG/ACT inhaler; Inhale 2 puffs into the lungs every 4 (four) hours as needed for wheezing or shortness of breath.  Dispense: 1 Inhaler; Refill: 1  4. Screening for metabolic disorder - MAU63+FHLK - Lipid panel  5. Screening for viral disease - Hepatitis c antibody (reflex) - HIV antibody (with reflex)   Meds ordered this encounter  Medications  . cetirizine (ZYRTEC) 10 MG tablet    Sig: Take 1 tablet (10 mg total) by mouth daily.    Dispense:  30 tablet    Refill:  3  . methocarbamol (ROBAXIN) 500 MG tablet    Sig: Take 1 tablet (500 mg total) by mouth 4 (four) times daily.    Dispense:  60 tablet    Refill:  2  . albuterol (PROVENTIL HFA;VENTOLIN HFA) 108 (90 Base) MCG/ACT inhaler    Sig: Inhale 2 puffs into the lungs every 4 (four) hours as needed for wheezing or shortness of breath.    Dispense:  1 Inhaler    Refill:  1  . topiramate (TOPAMAX) 100 MG tablet    Sig: 200 mg in am 100 mg in pm    Dispense:  45 tablet    Refill:  3    Follow-up: Return in about 3 weeks (around 03/21/2017) for Complete physical exam.   This note has been created with Surveyor, quantity. Any  transcriptional errors are unintentional.     Arnoldo Morale MD

## 2017-02-28 NOTE — Patient Instructions (Signed)

## 2017-03-01 ENCOUNTER — Encounter: Payer: Self-pay | Admitting: Physical Therapy

## 2017-03-01 LAB — CMP14+EGFR
A/G RATIO: 1.7 (ref 1.2–2.2)
ALK PHOS: 132 IU/L — AB (ref 39–117)
ALT: 19 IU/L (ref 0–32)
AST: 21 IU/L (ref 0–40)
Albumin: 4.3 g/dL (ref 3.5–5.5)
BILIRUBIN TOTAL: 0.4 mg/dL (ref 0.0–1.2)
BUN/Creatinine Ratio: 10 (ref 9–23)
BUN: 9 mg/dL (ref 6–24)
CHLORIDE: 108 mmol/L — AB (ref 96–106)
CO2: 20 mmol/L (ref 20–29)
Calcium: 9.6 mg/dL (ref 8.7–10.2)
Creatinine, Ser: 0.89 mg/dL (ref 0.57–1.00)
GFR calc Af Amer: 86 mL/min/{1.73_m2} (ref >59)
GFR calc non Af Amer: 75 mL/min/{1.73_m2} (ref >59)
Globulin, Total: 2.6 g/dL (ref 1.5–4.5)
Glucose: 106 mg/dL — ABNORMAL HIGH (ref 65–99)
POTASSIUM: 4 mmol/L (ref 3.5–5.2)
Sodium: 142 mmol/L (ref 134–144)
TOTAL PROTEIN: 6.9 g/dL (ref 6.0–8.5)

## 2017-03-01 LAB — LIPID PANEL
Chol/HDL Ratio: 5.1 ratio — ABNORMAL HIGH (ref 0.0–4.4)
Cholesterol, Total: 200 mg/dL — ABNORMAL HIGH (ref 100–199)
HDL: 39 mg/dL — AB (ref >39)
LDL Calculated: 138 mg/dL — ABNORMAL HIGH (ref 0–99)
TRIGLYCERIDES: 113 mg/dL (ref 0–149)
VLDL CHOLESTEROL CAL: 23 mg/dL (ref 5–40)

## 2017-03-01 LAB — HCV COMMENT:

## 2017-03-01 LAB — HEPATITIS C ANTIBODY (REFLEX)

## 2017-03-01 LAB — HIV ANTIBODY (ROUTINE TESTING W REFLEX): HIV Screen 4th Generation wRfx: NONREACTIVE

## 2017-03-06 ENCOUNTER — Encounter: Payer: Self-pay | Admitting: Physical Therapy

## 2017-03-08 ENCOUNTER — Encounter: Payer: Self-pay | Admitting: Physical Therapy

## 2017-03-08 ENCOUNTER — Telehealth: Payer: Self-pay

## 2017-03-08 NOTE — Telephone Encounter (Signed)
Pt was called and informed of lab results. 

## 2017-03-13 ENCOUNTER — Encounter: Payer: Self-pay | Admitting: Physical Therapy

## 2017-03-15 ENCOUNTER — Encounter: Payer: Self-pay | Admitting: Physical Therapy

## 2017-03-19 ENCOUNTER — Encounter (HOSPITAL_COMMUNITY): Payer: Self-pay | Admitting: Emergency Medicine

## 2017-03-19 ENCOUNTER — Ambulatory Visit (INDEPENDENT_AMBULATORY_CARE_PROVIDER_SITE_OTHER): Payer: Self-pay

## 2017-03-19 ENCOUNTER — Ambulatory Visit (HOSPITAL_COMMUNITY)
Admission: EM | Admit: 2017-03-19 | Discharge: 2017-03-19 | Disposition: A | Discharge status: Home / Self Care | Payer: Self-pay | Attending: Emergency Medicine | Admitting: Emergency Medicine

## 2017-03-19 DIAGNOSIS — M25462 Effusion, left knee: Secondary | ICD-10-CM

## 2017-03-19 MED ORDER — DICLOFENAC SODIUM 75 MG PO TBEC: 75.0000 mg | DELAYED_RELEASE_TABLET | Freq: Two times a day (BID) | ORAL | 0 refills | Status: DC

## 2017-03-19 NOTE — ED Triage Notes (Signed)
PT reports left knee pain and swelling for the last few days. No known injury.

## 2017-03-19 NOTE — ED Provider Notes (Signed)
  [facility]   [id number] 03/19/17 Arrival Time: 1629   SUBJECTIVE:  Monique Tyler is a 51 y.o. female who presents to the urgent care  with complaint of left knee pain for 3 days. Denies any traumatic cause, she has not fallen, or twisted her knee. She does work as a Paramedic, is on her feet frequently. Denies any other significant history  ROS: As per HPI, remainder of ROS negative.   OBJECTIVE:  Vitals:   03/19/17 1708  BP: 135/74  Pulse: 75  Resp: 16  Temp: 98.2 F (36.8 C)  TempSrc: Temporal  SpO2: 99%     General appearance: alert; no distress HEENT: normocephalic; atraumatic; conjunctivae normal;  Neck: Trachea midline, no JVD noted Lungs: clear to auscultation bilaterally Heart: regular rate and rhythm Abdomen:  Musculoskeletal: Notable swelling to the left knee, no abnormal patellar instability, is evidence of effusion with ballottement, no pain with lateral or medial stress, negative drawer sign Skin: warm and dry Neurologic: Grossly normal Psychological:  alert and cooperative; normal mood and affect     ASSESSMENT & PLAN:  1. Effusion of left knee     Meds ordered this encounter  Medications  . diclofenac (VOLTAREN) 75 MG EC tablet    Sig: Take 1 tablet (75 mg total) by mouth 2 (two) times daily.    Dispense:  20 tablet    Refill:  0    Order Specific Question:   Supervising Provider    Answer:   Mardella Layman [[id number]]   We'll treat with anti-inflammatories, and knee was wrapped with Ace bandage, follow up with orthopedics as needed.  Reviewed expectations re: course of current medical issues. Questions answered. Outlined signs and symptoms indicating need for more acute intervention. Patient verbalized understanding. After Visit Summary given.    Procedures:    Labs Reviewed - No data to display  Dg Knee Complete 4 Views Left  Result Date: 03/19/2017 CLINICAL DATA:  Acute left knee pain for 3 days. No known injury. Initial  encounter. EXAM: LEFT KNEE - COMPLETE 4+ VIEW COMPARISON:  09/19/2014 and prior radiographs FINDINGS: No evidence of fracture, dislocation, or joint effusion. No evidence of arthropathy or other focal bone abnormality. Soft tissues are unremarkable. IMPRESSION: Negative. Electronically Signed   By: Harmon Pier M.D.   On: 03/19/2017 18:29    Allergies  Allergen Reactions  . Shellfish Allergy Swelling  . Pork-Derived Products Nausea And Vomiting    PMHx, SurgHx, SocialHx, Medications, and Allergies were reviewed in the Visit Navigator and updated as appropriate.       Dorena Bodo, NP 03/19/17 2107

## 2017-03-19 NOTE — Discharge Instructions (Signed)
If pain persists, follow-up with an orthopedist, or return to clinic as needed

## 2017-03-20 ENCOUNTER — Encounter: Payer: Self-pay | Admitting: Physical Therapy

## 2017-03-22 ENCOUNTER — Encounter: Payer: Self-pay | Admitting: Physical Therapy

## 2017-03-27 ENCOUNTER — Encounter: Payer: Self-pay | Admitting: Physical Therapy

## 2017-03-29 ENCOUNTER — Encounter: Payer: Self-pay | Admitting: Physical Therapy

## 2017-04-06 ENCOUNTER — Encounter: Payer: Self-pay | Admitting: Family Medicine

## 2017-05-08 ENCOUNTER — Emergency Department (HOSPITAL_BASED_OUTPATIENT_CLINIC_OR_DEPARTMENT_OTHER)
Admission: EM | Admit: 2017-05-08 | Discharge: 2017-05-08 | Disposition: A | Discharge status: Home / Self Care | Payer: Self-pay | Attending: Emergency Medicine | Admitting: Emergency Medicine

## 2017-05-08 ENCOUNTER — Encounter (HOSPITAL_BASED_OUTPATIENT_CLINIC_OR_DEPARTMENT_OTHER): Payer: Self-pay

## 2017-05-08 ENCOUNTER — Emergency Department (HOSPITAL_BASED_OUTPATIENT_CLINIC_OR_DEPARTMENT_OTHER): Payer: Self-pay

## 2017-05-08 DIAGNOSIS — I10 Essential (primary) hypertension: Secondary | ICD-10-CM | POA: Insufficient documentation

## 2017-05-08 DIAGNOSIS — I160 Hypertensive urgency: Secondary | ICD-10-CM

## 2017-05-08 DIAGNOSIS — K219 Gastro-esophageal reflux disease without esophagitis: Secondary | ICD-10-CM | POA: Insufficient documentation

## 2017-05-08 DIAGNOSIS — Z79899 Other long term (current) drug therapy: Secondary | ICD-10-CM | POA: Insufficient documentation

## 2017-05-08 DIAGNOSIS — Z87891 Personal history of nicotine dependence: Secondary | ICD-10-CM | POA: Insufficient documentation

## 2017-05-08 LAB — BASIC METABOLIC PANEL
Anion gap: 7 (ref 5–15)
BUN: 15 mg/dL (ref 6–20)
CALCIUM: 9.1 mg/dL (ref 8.9–10.3)
CO2: 23 mmol/L (ref 22–32)
CREATININE: 1.04 mg/dL — AB (ref 0.44–1.00)
Chloride: 108 mmol/L (ref 101–111)
GFR calc non Af Amer: >60 mL/min (ref >60)
Glucose, Bld: 118 mg/dL — ABNORMAL HIGH (ref 65–99)
Potassium: 3.4 mmol/L — ABNORMAL LOW (ref 3.5–5.1)
SODIUM: 138 mmol/L (ref 135–145)

## 2017-05-08 LAB — CBC WITH DIFFERENTIAL/PLATELET
BASOS PCT: 0 %
Basophils Absolute: 0 10*3/uL (ref 0.0–0.1)
EOS ABS: 0.2 10*3/uL (ref 0.0–0.7)
Eosinophils Relative: 3 %
HCT: 38.3 % (ref 36.0–46.0)
HEMOGLOBIN: 12.1 g/dL (ref 12.0–15.0)
Lymphocytes Relative: 37 %
Lymphs Abs: 2.7 10*3/uL (ref 0.7–4.0)
MCH: 26.8 pg (ref 26.0–34.0)
MCHC: 31.6 g/dL (ref 30.0–36.0)
MCV: 84.9 fL (ref 78.0–100.0)
MONOS PCT: 11 %
Monocytes Absolute: 0.8 10*3/uL (ref 0.1–1.0)
NEUTROS PCT: 49 %
Neutro Abs: 3.5 10*3/uL (ref 1.7–7.7)
Platelets: 288 10*3/uL (ref 150–400)
RBC: 4.51 MIL/uL (ref 3.87–5.11)
RDW: 13.7 % (ref 11.5–15.5)
WBC: 7.2 10*3/uL (ref 4.0–10.5)

## 2017-05-08 LAB — TROPONIN I

## 2017-05-08 NOTE — Discharge Instructions (Signed)
Follow-up with your primary Dr. In the next week for a recheck of your blood pressure. Keep a record of your blood pressures in the meantime to take with you to this appointment for your doctor to review.

## 2017-05-08 NOTE — ED Notes (Signed)
Pt verbalizes understanding of d/c instructions and denies any further needs at this time. 

## 2017-05-08 NOTE — ED Notes (Signed)
Patient transported to CT 

## 2017-05-08 NOTE — ED Triage Notes (Signed)
Pt states that at 1800 she was "not feeling well," took her blood pressure and it was elevated.  She tried to relax and drink tea and continued to take her blood pressure and it continued to be high.  Pt denies SOB, denies chest pain, denies peripheral edema, states her left arm is "numb" and it started at 2100.  Pt denies hx of HTN, although chart states otherwise.  Pt endorses "lightheadedness."

## 2017-05-08 NOTE — ED Provider Notes (Signed)
[facility] EMERGENCY DEPARTMENT Provider Note   CSN: [id number] Arrival date & time: 05/08/17  0258     History   Chief Complaint Chief Complaint  Patient presents with  . Hypertension    HPI Monique Tyler is a 51 y.o. female.  Patient is a 51 year old female with past medical history of GERD, migraines. She presents tonight with complaints of feeling "really, really bad" earlier this evening. She then began taking her blood pressure which subsequently climbed with each measurement. She states at one point it was as high as 180/110. She reports mild headache along with numbness in her left arm. She denies any numbness or weakness. She denies any chest pain or palpitations.   The history is provided by the patient.  Hypertension  This is a new problem. The current episode started 1 to 2 hours ago. The problem occurs constantly. The problem has been gradually worsening. Associated symptoms include headaches. Pertinent negatives include no chest pain and no shortness of breath. Nothing aggravates the symptoms. Nothing relieves the symptoms.    Past Medical History:  Diagnosis Date  . GERD (gastroesophageal reflux disease)   . Hypertension    pt denies a hx of HTN  . Kidney stones   . Lung nodules   . Migraine   . Vertigo     Patient Active Problem List   Diagnosis Date Noted  . Status post right rotator cuff repair 02/28/2017  . Seasonal allergies 02/28/2017  . Migraine 11/30/2015  . Benign paroxysmal positional vertigo 11/30/2015  . Headache, migraine 09/23/2014  . Ankle sprain 09/23/2014  . Contusion of right knee 09/23/2014  . Wrist sprain 09/23/2014  . Chest pain 02/17/2014  . Avitaminosis D 11/28/2011    Past Surgical History:  Procedure Laterality Date  . CESAREAN SECTION    . MANDIBLE SURGERY    . TUBAL LIGATION      OB History    Gravida Para Term Preterm AB Living   5 5           SAB TAB Ectopic Multiple Live Births         1          Home Medications    Prior to Admission medications   Medication Sig Start Date End Date Taking? Authorizing Provider  albuterol (PROVENTIL HFA;VENTOLIN HFA) 108 (90 Base) MCG/ACT inhaler Inhale 2 puffs into the lungs every 4 (four) hours as needed for wheezing or shortness of breath. 02/28/17   Jaclyn Shaggy, MD  cetirizine (ZYRTEC) 10 MG tablet Take 1 tablet (10 mg total) by mouth daily. 02/28/17   Jaclyn Shaggy, MD  diclofenac (VOLTAREN) 75 MG EC tablet Take 1 tablet (75 mg total) by mouth 2 (two) times daily. 03/19/17   Dorena Bodo, NP  methocarbamol (ROBAXIN) 500 MG tablet Take 1 tablet (500 mg total) by mouth 4 (four) times daily. 02/28/17   Jaclyn Shaggy, MD  topiramate (TOPAMAX) 100 MG tablet 200 mg in am 100 mg in pm 02/28/17   Jaclyn Shaggy, MD  traMADol (ULTRAM) 50 MG tablet Take 1 tablet (50 mg total) by mouth every 6 (six) hours as needed. 01/26/17   Vivianne Master, PA-C    Family History No family history on file.  Social History Social History  Substance Use Topics  . Smoking status: Former Smoker    Types: Cigarettes  . Smokeless tobacco: Never Used  . Alcohol use No     Allergies   Shellfish allergy and Pork-derived products  Review of Systems Review of Systems  Respiratory: Negative for shortness of breath.   Cardiovascular: Negative for chest pain.  Neurological: Positive for headaches.  All other systems reviewed and are negative.    Physical Exam Updated Vital Signs BP (!) 145/84 (BP Location: Right Arm)   Pulse 89 Comment: Simultaneous filing. User may not have seen previous data.  Temp 98.2 F (36.8 C) (Oral)   Resp (!) 21 Comment: Simultaneous filing. User may not have seen previous data.  Ht  (1.702 m)   Wt 117.5 kg (259 lb)   LMP 01/18/2012   SpO2 100% Comment: Simultaneous filing. User may not have seen previous data.  BMI 40.57 kg/m   Physical Exam  Constitutional: She is oriented to person, place, and time. She appears  well-developed and well-nourished. No distress.  HENT:  Head: Normocephalic and atraumatic.  Mouth/Throat: Oropharynx is clear and moist.  Eyes: Pupils are equal, round, and reactive to light. EOM are normal.  Neck: Normal range of motion. Neck supple.  Cardiovascular: Normal rate and regular rhythm.  Exam reveals no gallop and no friction rub.   No murmur heard. Pulmonary/Chest: Effort normal and breath sounds normal. No respiratory distress. She has no wheezes.  Abdominal: Soft. Bowel sounds are normal. She exhibits no distension. There is no tenderness.  Musculoskeletal: Normal range of motion.  Neurological: She is alert and oriented to person, place, and time. No cranial nerve deficit. She exhibits normal muscle tone. Coordination normal.  Skin: Skin is warm and dry. She is not diaphoretic.  Nursing note and vitals reviewed.    ED Treatments / Results  Labs (all labs ordered are listed, but only abnormal results are displayed) Labs Reviewed  BASIC METABOLIC PANEL  CBC WITH DIFFERENTIAL/PLATELET  TROPONIN I    EKG  EKG Interpretation  Date/Time:  Tuesday May 08 2017 03:08:07 EDT Ventricular Rate:  91 PR Interval:    QRS Duration: 107 QT Interval:  377 QTC Calculation: 464 R Axis:   21 Text Interpretation:  Sinus rhythm Low voltage, precordial leads unchanged from 08/24/2016 Confirmed by Geoffery Lyons ([id number]) on 05/08/2017 3:21:17 AM       Radiology No results found.  Procedures Procedures (including critical care time)  Medications Ordered in ED Medications - No data to display   Initial Impression / Assessment and Plan / ED Course  I have reviewed the triage vital signs and the nursing notes.  Pertinent labs & imaging results that were available during my care of the patient were reviewed by me and considered in my medical decision making (see chart for details).  Patient presents with elevated blood pressure and left arm numbness. Blood pressure here  is not elevated and her workup is reassuring. I am uncertain as to whether she is experiencing some sort of a migraine, however this does not appear to be a stroke. She will be discharged with follow-up with her primary doctor.  Final Clinical Impressions(s) / ED Diagnoses   Final diagnoses:  None    New Prescriptions New Prescriptions   No medications on file     Geoffery Lyons, MD 05/08/17 [phone]

## 2017-06-25 ENCOUNTER — Encounter (HOSPITAL_BASED_OUTPATIENT_CLINIC_OR_DEPARTMENT_OTHER): Payer: Self-pay | Admitting: *Deleted

## 2017-06-25 ENCOUNTER — Other Ambulatory Visit: Payer: Self-pay

## 2017-06-25 ENCOUNTER — Emergency Department (HOSPITAL_BASED_OUTPATIENT_CLINIC_OR_DEPARTMENT_OTHER)
Admission: EM | Admit: 2017-06-25 | Discharge: 2017-06-25 | Disposition: A | Discharge status: Home / Self Care | Payer: Self-pay | Attending: Emergency Medicine | Admitting: Emergency Medicine

## 2017-06-25 ENCOUNTER — Emergency Department (HOSPITAL_BASED_OUTPATIENT_CLINIC_OR_DEPARTMENT_OTHER): Payer: Self-pay

## 2017-06-25 DIAGNOSIS — R1033 Periumbilical pain: Secondary | ICD-10-CM

## 2017-06-25 DIAGNOSIS — Z79899 Other long term (current) drug therapy: Secondary | ICD-10-CM | POA: Insufficient documentation

## 2017-06-25 DIAGNOSIS — I10 Essential (primary) hypertension: Secondary | ICD-10-CM | POA: Insufficient documentation

## 2017-06-25 DIAGNOSIS — N3001 Acute cystitis with hematuria: Secondary | ICD-10-CM | POA: Insufficient documentation

## 2017-06-25 DIAGNOSIS — Z87891 Personal history of nicotine dependence: Secondary | ICD-10-CM | POA: Insufficient documentation

## 2017-06-25 LAB — CBC WITH DIFFERENTIAL/PLATELET
BASOS ABS: 0 10*3/uL (ref 0.0–0.1)
Basophils Relative: 0 %
EOS PCT: 0 %
Eosinophils Absolute: 0 10*3/uL (ref 0.0–0.7)
HCT: 41.5 % (ref 36.0–46.0)
Hemoglobin: 13.2 g/dL (ref 12.0–15.0)
LYMPHS ABS: 1.2 10*3/uL (ref 0.7–4.0)
LYMPHS PCT: 16 %
MCH: 26.6 pg (ref 26.0–34.0)
MCHC: 31.8 g/dL (ref 30.0–36.0)
MCV: 83.7 fL (ref 78.0–100.0)
MONO ABS: 0.8 10*3/uL (ref 0.1–1.0)
MONOS PCT: 12 %
Neutro Abs: 5.2 10*3/uL (ref 1.7–7.7)
Neutrophils Relative %: 72 %
PLATELETS: 291 10*3/uL (ref 150–400)
RBC: 4.96 MIL/uL (ref 3.87–5.11)
RDW: 13.9 % (ref 11.5–15.5)
WBC: 7.2 10*3/uL (ref 4.0–10.5)

## 2017-06-25 LAB — COMPREHENSIVE METABOLIC PANEL
ALT: 26 U/L (ref 14–54)
AST: 23 U/L (ref 15–41)
Albumin: 4 g/dL (ref 3.5–5.0)
Alkaline Phosphatase: 129 U/L — ABNORMAL HIGH (ref 38–126)
Anion gap: 7 (ref 5–15)
BILIRUBIN TOTAL: 0.5 mg/dL (ref 0.3–1.2)
BUN: 9 mg/dL (ref 6–20)
CHLORIDE: 104 mmol/L (ref 101–111)
CO2: 22 mmol/L (ref 22–32)
CREATININE: 0.79 mg/dL (ref 0.44–1.00)
Calcium: 9.2 mg/dL (ref 8.9–10.3)
GFR calc Af Amer: >60 mL/min (ref >60)
GLUCOSE: 117 mg/dL — AB (ref 65–99)
Potassium: 3.9 mmol/L (ref 3.5–5.1)
Sodium: 133 mmol/L — ABNORMAL LOW (ref 135–145)
Total Protein: 7.7 g/dL (ref 6.5–8.1)

## 2017-06-25 LAB — PREGNANCY, URINE: Preg Test, Ur: NEGATIVE

## 2017-06-25 LAB — URINALYSIS, ROUTINE W REFLEX MICROSCOPIC
BILIRUBIN URINE: NEGATIVE
GLUCOSE, UA: NEGATIVE mg/dL
KETONES UR: NEGATIVE mg/dL
Leukocytes, UA: NEGATIVE
Nitrite: POSITIVE — AB
PROTEIN: NEGATIVE mg/dL
Specific Gravity, Urine: 1.015 (ref 1.005–1.030)
pH: 7.5 (ref 5.0–8.0)

## 2017-06-25 LAB — URINALYSIS, MICROSCOPIC (REFLEX): WBC, UA: NONE SEEN WBC/hpf (ref 0–5)

## 2017-06-25 LAB — LIPASE, BLOOD: LIPASE: 35 U/L (ref 11–51)

## 2017-06-25 MED ORDER — ONDANSETRON HCL 4 MG/2ML IJ SOLN
4.0000 mg | Freq: Once | INTRAMUSCULAR | Status: AC
  Administered 2017-06-25: 4 mg via INTRAVENOUS
  Filled 2017-06-25: qty 2

## 2017-06-25 MED ORDER — ONDANSETRON 4 MG PO TBDP: 4.0000 mg | ORAL_TABLET | Freq: Three times a day (TID) | ORAL | 0 refills | Status: DC | PRN

## 2017-06-25 MED ORDER — MORPHINE SULFATE (PF) 4 MG/ML IV SOLN
4.0000 mg | Freq: Once | INTRAVENOUS | Status: AC
  Administered 2017-06-25: 4 mg via INTRAVENOUS
  Filled 2017-06-25: qty 1

## 2017-06-25 MED ORDER — NITROFURANTOIN MONOHYD MACRO 100 MG PO CAPS: 100.0000 mg | ORAL_CAPSULE | Freq: Two times a day (BID) | ORAL | 0 refills | Status: DC

## 2017-06-25 NOTE — ED Notes (Signed)
Pt. Reports she has had cold and upper resp. Symptoms since Sat.  Pt. Stated she has had a fever off and on at home.  Pt. Stated she started with nausea vomiting and soft stool today.  No distress noted in pt.

## 2017-06-25 NOTE — Discharge Instructions (Signed)
You presented to the ED for evaluation of abdominal pain. You have a urinary tract infection. Your lab work was reassuring today. Take antibiotics as prescribed for urinary tract infection. Take Zofran for nausea as needed. Stay well-hydrated. Return to the emergency department if you developed worsening abdominal pain, inability to tolerate fluids, blood in stool.

## 2017-06-25 NOTE — ED Triage Notes (Signed)
Sharp left mid quadrant pain this afternoon. She has had a cold with a headache and  diarrhea x 2 days. Vomiting today.

## 2017-06-25 NOTE — ED Provider Notes (Signed)
[facility] EMERGENCY DEPARTMENT Provider Note   CSN: [id number] Arrival date & time: 06/25/17  1708     History   Chief Complaint Chief Complaint  Patient presents with  . Abdominal Pain    HPI Monique Tyler is a 51 y.o. female w/ h/o GERD, kidney stones, migraines presents for evaluation of intermittent left mid umbilical abdominal pain x 1 day. Associated symptoms include nausea and vomiting x 2 today. Reports ongoing "cold" for 3 days with nasal congestion, rhinorrhea, cough, chills, fevers, generalized malaise. Reports coworkers have flu like symptoms. Reports urinary frequency but no dysuria or hematuria. No chest pain, shortness of breath, dizziness, abnormal vaginal discharge or bleeding, flank pain, melena, hematochezia. No alleviating factors, no interventions PTA. Abdominal pain worse with palpation. H/o c-sections. No other abdominal surgeries. No h/o PUD, diverticulitis, IBD, IBS.   HPI  Past Medical History:  Diagnosis Date  . GERD (gastroesophageal reflux disease)   . Hypertension    pt denies a hx of HTN  . Kidney stones   . Lung nodules   . Migraine   . Vertigo     Patient Active Problem List   Diagnosis Date Noted  . Status post right rotator cuff repair 02/28/2017  . Seasonal allergies 02/28/2017  . Migraine 11/30/2015  . Benign paroxysmal positional vertigo 11/30/2015  . Headache, migraine 09/23/2014  . Ankle sprain 09/23/2014  . Contusion of right knee 09/23/2014  . Wrist sprain 09/23/2014  . Chest pain 02/17/2014  . Avitaminosis D 11/28/2011    Past Surgical History:  Procedure Laterality Date  . CESAREAN SECTION    . MANDIBLE SURGERY    . TUBAL LIGATION      OB History    Gravida Para Term Preterm AB Living   5 5           SAB TAB Ectopic Multiple Live Births         1         Home Medications    Prior to Admission medications   Medication Sig Start Date End Date Taking? Authorizing Provider  albuterol (PROVENTIL  HFA;VENTOLIN HFA) 108 (90 Base) MCG/ACT inhaler Inhale 2 puffs into the lungs every 4 (four) hours as needed for wheezing or shortness of breath. 02/28/17   Jaclyn ShaggyAmao, Enobong, MD  cetirizine (ZYRTEC) 10 MG tablet Take 1 tablet (10 mg total) by mouth daily. 02/28/17   Jaclyn ShaggyAmao, Enobong, MD  diclofenac (VOLTAREN) 75 MG EC tablet Take 1 tablet (75 mg total) by mouth 2 (two) times daily. 03/19/17   Dorena BodoKennard, Lawrence, NP  methocarbamol (ROBAXIN) 500 MG tablet Take 1 tablet (500 mg total) by mouth 4 (four) times daily. 02/28/17   Jaclyn ShaggyAmao, Enobong, MD  nitrofurantoin, macrocrystal-monohydrate, (MACROBID) 100 MG capsule Take 1 capsule (100 mg total) by mouth 2 (two) times daily. 06/25/17   Liberty HandyGibbons, Joselito Fieldhouse J, PA-C  ondansetron (ZOFRAN ODT) 4 MG disintegrating tablet Take 1 tablet (4 mg total) by mouth every 8 (eight) hours as needed for nausea or vomiting. 06/25/17   Liberty HandyGibbons, Tareq Dwan J, PA-C  topiramate (TOPAMAX) 100 MG tablet 200 mg in am 100 mg in pm 02/28/17   Jaclyn ShaggyAmao, Enobong, MD  traMADol (ULTRAM) 50 MG tablet Take 1 tablet (50 mg total) by mouth every 6 (six) hours as needed. 01/26/17   Vivianne MasterNoel, Tiffany S, PA-C    Family History No family history on file.  Social History Social History   Tobacco Use  . Smoking status: Former Smoker    Types: Cigarettes  .  Smokeless tobacco: Never Used  Substance Use Topics  . Alcohol use: No    Alcohol/week: 0.0 oz  . Drug use: No     Allergies   Shellfish allergy and Pork-derived products   Review of Systems Review of Systems  Constitutional: Positive for appetite change, chills and fever.  HENT: Positive for congestion, postnasal drip and rhinorrhea.   Respiratory: Positive for cough.   Gastrointestinal: Positive for abdominal pain, nausea and vomiting.     Physical Exam Updated Vital Signs BP 119/65 (BP Location: Left Arm)   Pulse 66   Temp 98.9 F (37.2 C) (Oral)   Resp 18   Ht 5\' 7"  (1.702 m)   Wt 113.4 kg (250 lb)   LMP 01/18/2012   SpO2 96%   BMI 39.16  kg/m   Physical Exam  Constitutional: She is oriented to person, place, and time. She appears well-developed and well-nourished. No distress.  HENT:  Head: Normocephalic and atraumatic.  Nose: Nose normal.  Mouth/Throat: Oropharynx is clear and moist. No oropharyngeal exudate.  Moderate mucosal edema  Oropharynx erythematous w/o exudates or edema Tonsils normal   Eyes: Conjunctivae and EOM are normal. Pupils are equal, round, and reactive to light.  Neck: Normal range of motion. Neck supple.  Cardiovascular: Normal rate, regular rhythm, normal heart sounds and intact distal pulses.  No murmur heard. Pulmonary/Chest: Effort normal and breath sounds normal. She exhibits no tenderness.  Abdominal: Soft. Bowel sounds are normal. She exhibits no distension. There is tenderness in the periumbilical area and suprapubic area.  Mild periumbilical left mid and suprapubic abdominal tenderness. No CVAT. No G/R/R.   Musculoskeletal: Normal range of motion. She exhibits no deformity.  Lymphadenopathy:    She has no cervical adenopathy.  Neurological: She is alert and oriented to person, place, and time. No sensory deficit.  Skin: Skin is warm and dry. Capillary refill takes less than 2 seconds.  Psychiatric: She has a normal mood and affect. Her behavior is normal. Judgment and thought content normal.  Nursing note and vitals reviewed.    ED Treatments / Results  Labs (all labs ordered are listed, but only abnormal results are displayed) Labs Reviewed  COMPREHENSIVE METABOLIC PANEL - Abnormal; Notable for the following components:      Result Value   Sodium 133 (*)    Glucose, Bld 117 (*)    Alkaline Phosphatase 129 (*)    All other components within normal limits  URINALYSIS, ROUTINE W REFLEX MICROSCOPIC - Abnormal; Notable for the following components:   APPearance CLOUDY (*)    Hgb urine dipstick TRACE (*)    Nitrite POSITIVE (*)    All other components within normal limits    URINALYSIS, MICROSCOPIC (REFLEX) - Abnormal; Notable for the following components:   Bacteria, UA FEW (*)    Squamous Epithelial / LPF 6-30 (*)    All other components within normal limits  LIPASE, BLOOD  CBC WITH DIFFERENTIAL/PLATELET  PREGNANCY, URINE    EKG  EKG Interpretation None       Radiology Dg Chest 2 View  Result Date: 06/25/2017 CLINICAL DATA:  Cough, fevers EXAM: CHEST  2 VIEW COMPARISON:  CT chest 08/24/2016 FINDINGS: The heart size and mediastinal contours are within normal limits. Both lungs are clear. The visualized skeletal structures are unremarkable. IMPRESSION: No active cardiopulmonary disease. Electronically Signed   By: Elige Ko   On: 06/25/2017 18:53    Procedures Procedures (including critical care time)  Medications Ordered in ED Medications  morphine 4 MG/ML injection 4 mg (4 mg Intravenous Given 06/25/17 1845)  ondansetron (ZOFRAN) injection 4 mg (4 mg Intravenous Given 06/25/17 1844)     Initial Impression / Assessment and Plan / ED Course  I have reviewed the triage vital signs and the nursing notes.  Pertinent labs & imaging results that were available during my care of the patient were reviewed by me and considered in my medical decision making (see chart for details).  Clinical Course as of Jun 26 104  Mon Jun 25, 2017  2019 Appearance: (!) CLOUDY [CG]  2019 Hgb urine dipstick: (!) TRACE [CG]  2019 Nitrite: (!) POSITIVE [CG]    Clinical Course User Index [CG] Liberty HandyGibbons, Ashle Stief J, PA-C   51 yo female presents with periumbilical abdominal pain x 1 day with nausea and vomiting. Preceding URI type symptoms. Known sick contacts with influenza. Ho c-sections but no other abdominal surgeries. No h/o PUD, gastritis, pancreatitis, IBD, IBS, diverticulitis. Reports urinary frequency.   On exam, she has mild umbilical tenderness without peritoneal signs. Negative Murphy's and McBurney's. Mild suprapubic tenderness with no CVA tenderness.    Lab work today shows urinary tract infection, she does report urinary frequency. No CVA tenderness Fevers or suspicion for  Pyelonephritis.  Patient was given IV fluids, antiemetics and analgesia. Repeat abdominal exam significantly improved. She is tolerating oral fluids. No vomiting in the emergency department. Given reassuring workup, benign exam and improvement of symptoms I don't think patient requires emergentimaging at this point. This patient for viral illness, possibly influenza. We'll discharge with conservative management and abx for UTI. Discussed return precautions. Patient verbalized understanding and is agreeable with plan.  Final Clinical Impressions(s) / ED Diagnoses   Final diagnoses:  Periumbilical abdominal pain  Acute cystitis with hematuria    ED Discharge Orders        Ordered    ondansetron (ZOFRAN ODT) 4 MG disintegrating tablet  Every 8 hours PRN     06/25/17 2044    nitrofurantoin, macrocrystal-monohydrate, (MACROBID) 100 MG capsule  2 times daily     06/25/17 2044       Liberty HandyGibbons, Adriyanna Christians J, PA-C 06/26/17 0106    Charlynne PanderYao, David Hsienta, MD 06/26/17 [phone]

## 2017-07-19 ENCOUNTER — Ambulatory Visit: Payer: Self-pay | Attending: Family Medicine | Admitting: Physician Assistant

## 2017-07-19 VITALS — BP 123/70 | HR 92 | Temp 98.4°F | Resp 18 | Ht 67.0 in | Wt 254.0 lb

## 2017-07-19 DIAGNOSIS — Z91013 Allergy to seafood: Secondary | ICD-10-CM | POA: Insufficient documentation

## 2017-07-19 DIAGNOSIS — J40 Bronchitis, not specified as acute or chronic: Secondary | ICD-10-CM

## 2017-07-19 DIAGNOSIS — K219 Gastro-esophageal reflux disease without esophagitis: Secondary | ICD-10-CM | POA: Insufficient documentation

## 2017-07-19 DIAGNOSIS — I10 Essential (primary) hypertension: Secondary | ICD-10-CM | POA: Insufficient documentation

## 2017-07-19 DIAGNOSIS — Z79899 Other long term (current) drug therapy: Secondary | ICD-10-CM | POA: Insufficient documentation

## 2017-07-19 DIAGNOSIS — K529 Noninfective gastroenteritis and colitis, unspecified: Secondary | ICD-10-CM | POA: Insufficient documentation

## 2017-07-19 MED ORDER — AZITHROMYCIN 250 MG PO TABS: ORAL_TABLET | ORAL | 0 refills | Status: DC

## 2017-07-19 MED ORDER — BENZONATATE 100 MG PO CAPS: 100.0000 mg | ORAL_CAPSULE | Freq: Three times a day (TID) | ORAL | 0 refills | Status: DC | PRN

## 2017-07-19 MED ORDER — FLUTICASONE PROPIONATE 50 MCG/ACT NA SUSP: 2.0000 | Freq: Every day | NASAL | 6 refills | Status: DC

## 2017-07-19 MED FILL — BENZONATATE 100 MG CAPSULE: 100 | 10 days supply | Qty: 30 | Fill #0

## 2017-07-19 MED FILL — AZITHROMYCIN 250 MG TABLET: 250 | 5 days supply | Qty: 6 | Fill #0

## 2017-07-19 MED FILL — FLUTICASONE PROP 50 MCG SPR: 50 | 30 days supply | Qty: 16 | Fill #0

## 2017-07-19 NOTE — Progress Notes (Signed)
Patient ID: Monique Tyler, female   DOB: August 10, 1965, 51 y.o.   MRN: [id number]   Monique DupreBeth Tyler, is a 51 y.o. female  [id number]  [id number]  DOB - August 10, 1965  Subjective:  Chief Complaint and HPI: Monique Tyler is a 51 y.o. female here today after being seen in the ED 06/25/2017 for gastroenteritis.  Since then, she developed  A cough that wont go away.  Cough is productive of green mucus.  Head is congested.  No f/c.  GI symptoms totally resolved after a few days.  Her appetite is normal.  Voice is hoarse.  +sinus HA   ROS:   Constitutional:  No f/c, No night sweats, No unexplained weight loss. EENT:  No vision changes, No blurry vision, No hearing changes. + sinus congestion, hoarse voice, and ST in the morning.   Respiratory: + cough, No SOB Cardiac: No CP, no palpitations GI:  No abd pain, No N/V/D. GU: No Urinary s/sx Musculoskeletal: No joint pain Neuro: +sinus headache, no dizziness, no motor weakness.  Skin: No rash Endocrine:  No polydipsia. No polyuria.  Psych: Denies SI/HI  No problems updated.  ALLERGIES: Allergies  Allergen Reactions  . Shellfish Allergy Swelling  . Pork-Derived Products Nausea And Vomiting    PAST MEDICAL HISTORY: Past Medical History:  Diagnosis Date  . GERD (gastroesophageal reflux disease)   . Hypertension    pt denies a hx of HTN  . Kidney stones   . Lung nodules   . Migraine   . Vertigo     MEDICATIONS AT HOME: Prior to Admission medications   Medication Sig Start Date End Date Taking? Authorizing Provider  albuterol (PROVENTIL HFA;VENTOLIN HFA) 108 (90 Base) MCG/ACT inhaler Inhale 2 puffs into the lungs every 4 (four) hours as needed for wheezing or shortness of breath. 02/28/17  Yes Jaclyn ShaggyAmao, Enobong, MD  cetirizine (ZYRTEC) 10 MG tablet Take 1 tablet (10 mg total) by mouth daily. 02/28/17  Yes Jaclyn ShaggyAmao, Enobong, MD  diclofenac (VOLTAREN) 75 MG EC tablet Take 1 tablet (75 mg total) by mouth 2 (two) times daily. 03/19/17  Yes Dorena BodoKennard,  Lawrence, NP  methocarbamol (ROBAXIN) 500 MG tablet Take 1 tablet (500 mg total) by mouth 4 (four) times daily. 02/28/17  Yes Jaclyn ShaggyAmao, Enobong, MD  ondansetron (ZOFRAN ODT) 4 MG disintegrating tablet Take 1 tablet (4 mg total) by mouth every 8 (eight) hours as needed for nausea or vomiting. 06/25/17  Yes Liberty HandyGibbons, Claudia J, PA-C  topiramate (TOPAMAX) 100 MG tablet 200 mg in am 100 mg in pm 02/28/17  Yes Amao, Odette HornsEnobong, MD  traMADol (ULTRAM) 50 MG tablet Take 1 tablet (50 mg total) by mouth every 6 (six) hours as needed. 01/26/17  Yes Vivianne MasterNoel, Tiffany S, PA-C  azithromycin (ZITHROMAX) 250 MG tablet Take 2 today then 1 daily 07/19/17   Georgian CoMcClung, Angela M, PA-C  benzonatate (TESSALON) 100 MG capsule Take 1 capsule (100 mg total) by mouth 3 (three) times daily as needed for cough. 07/19/17   Anders SimmondsMcClung, Angela M, PA-C  fluticasone (FLONASE) 50 MCG/ACT nasal spray Place 2 sprays into both nostrils daily. 07/19/17   Anders SimmondsMcClung, Angela M, PA-C     Objective:  EXAM:   Vitals:   07/19/17 1026  BP: 123/70  Pulse: 92  Resp: 18  Temp: 98.4 F (36.9 C)  TempSrc: Oral  SpO2: 97%  Weight: 254 lb (115.2 kg)  Height: 5\' 7"  (1.702 m)    General appearance : A&OX3. NAD. Non-toxic-appearing HEENT: Atraumatic and Normocephalic.  PERRLA. EOM intact.  TM full B. Mouth-MMM, post pharynx WNL w/o erythema, + PND. Neck: supple, no JVD. No cervical lymphadenopathy. No thyromegaly Chest/Lungs:  Breathing-non-labored, Good air entry bilaterally, breath sounds normal without rales, rhonchi, or wheezing  CVS: S1 S2 regular, no murmurs, gallops, rubs  Extremities: Bilateral Lower Ext shows no edema, both legs are warm to touch with = pulse throughout Neurology:  CN II-XII grossly intact, Non focal.   Psych:  TP linear. J/I WNL. Normal speech. Appropriate eye contact and affect.  Skin:  No Rash  Data Review Lab Results  Component Value Date   HGBA1C  12/09/2007    4.6 (NOTE)   The ADA recommends the following therapeutic goals  for glycemic   control related to Hgb A1C measurement:   Goal of Therapy:   < 7.0% Hgb A1C   Action Suggested:  > 8.0% Hgb A1C   Ref:  Diabetes Care, 22, Suppl. 1, 1999     Assessment & Plan   1. Bronchitis Will cover for atypicals due to cough >2 weeks.  No f/c/non-toxic - azithromycin (ZITHROMAX) 250 MG tablet; Take 2 today then 1 daily  Dispense: 6 tablet; Refill: 0 - benzonatate (TESSALON) 100 MG capsule; Take 1 capsule (100 mg total) by mouth 3 (three) times daily as needed for cough.  Dispense: 30 capsule; Refill: 0 - fluticasone (FLONASE) 50 MCG/ACT nasal spray; Place 2 sprays into both nostrils daily.  Dispense: 16 g; Refill: 6 Fluids, rest, respiratory care and note OOW.   Patient have been counseled extensively about nutrition and exercise  Return if symptoms worsen or fail to improve.  The patient was given clear instructions to go to ER or return to medical center if symptoms don't improve, worsen or new problems develop. The patient verbalized understanding. The patient was told to call to get lab results if they haven't heard anything in the next week.     Georgian CoAngela McClung, PA-C [facility] [place], [place] [phone]   07/19/2017, 10:33 AM

## 2017-08-31 ENCOUNTER — Ambulatory Visit: Payer: Self-pay | Admitting: Family Medicine

## 2017-10-31 ENCOUNTER — Other Ambulatory Visit: Payer: Self-pay | Admitting: Family Medicine

## 2017-10-31 DIAGNOSIS — G43709 Chronic migraine without aura, not intractable, without status migrainosus: Secondary | ICD-10-CM

## 2017-11-06 ENCOUNTER — Other Ambulatory Visit: Payer: Self-pay

## 2017-11-06 ENCOUNTER — Telehealth: Payer: Self-pay | Admitting: Family Medicine

## 2017-11-06 DIAGNOSIS — G43709 Chronic migraine without aura, not intractable, without status migrainosus: Secondary | ICD-10-CM

## 2017-11-06 MED ORDER — TOPIRAMATE 100 MG PO TABS: ORAL_TABLET | ORAL | 0 refills | Status: DC

## 2017-11-06 NOTE — Telephone Encounter (Signed)
Patient was called to get medications that needed to be refilled. Patient did not answer the phone If patient returns phone call please find out what medications she need.

## 2017-11-06 NOTE — Telephone Encounter (Signed)
Sent to requested pharmacy.

## 2017-11-06 NOTE — Telephone Encounter (Signed)
Patient called and requested if pcp could refill listed medication until her appointment on the 25th of April. Patient stated she would be sick without medication. [facility]

## 2017-11-06 NOTE — Telephone Encounter (Signed)
topiramate (TOPAMAX) 100 MG tablet [[id number]][[id number]]

## 2017-11-15 ENCOUNTER — Ambulatory Visit: Payer: Self-pay | Attending: Family Medicine | Admitting: Family Medicine

## 2017-11-15 ENCOUNTER — Encounter: Payer: Self-pay | Admitting: Family Medicine

## 2017-11-15 ENCOUNTER — Ambulatory Visit: Payer: Self-pay | Admitting: Licensed Clinical Social Worker

## 2017-11-15 VITALS — BP 127/86 | HR 77 | Temp 98.7°F | Ht 67.0 in | Wt 251.6 lb

## 2017-11-15 DIAGNOSIS — M5412 Radiculopathy, cervical region: Secondary | ICD-10-CM | POA: Insufficient documentation

## 2017-11-15 DIAGNOSIS — G43709 Chronic migraine without aura, not intractable, without status migrainosus: Secondary | ICD-10-CM | POA: Insufficient documentation

## 2017-11-15 DIAGNOSIS — Z1239 Encounter for other screening for malignant neoplasm of breast: Secondary | ICD-10-CM

## 2017-11-15 DIAGNOSIS — G8929 Other chronic pain: Secondary | ICD-10-CM | POA: Insufficient documentation

## 2017-11-15 DIAGNOSIS — F419 Anxiety disorder, unspecified: Secondary | ICD-10-CM | POA: Insufficient documentation

## 2017-11-15 DIAGNOSIS — F32A Depression, unspecified: Secondary | ICD-10-CM

## 2017-11-15 DIAGNOSIS — I10 Essential (primary) hypertension: Secondary | ICD-10-CM | POA: Insufficient documentation

## 2017-11-15 DIAGNOSIS — Z79899 Other long term (current) drug therapy: Secondary | ICD-10-CM | POA: Insufficient documentation

## 2017-11-15 DIAGNOSIS — Z760 Encounter for issue of repeat prescription: Secondary | ICD-10-CM | POA: Insufficient documentation

## 2017-11-15 DIAGNOSIS — Z9889 Other specified postprocedural states: Secondary | ICD-10-CM | POA: Insufficient documentation

## 2017-11-15 DIAGNOSIS — M25511 Pain in right shoulder: Secondary | ICD-10-CM | POA: Insufficient documentation

## 2017-11-15 DIAGNOSIS — Z113 Encounter for screening for infections with a predominantly sexual mode of transmission: Secondary | ICD-10-CM

## 2017-11-15 DIAGNOSIS — R918 Other nonspecific abnormal finding of lung field: Secondary | ICD-10-CM | POA: Insufficient documentation

## 2017-11-15 DIAGNOSIS — G4700 Insomnia, unspecified: Secondary | ICD-10-CM | POA: Insufficient documentation

## 2017-11-15 DIAGNOSIS — Z87442 Personal history of urinary calculi: Secondary | ICD-10-CM | POA: Insufficient documentation

## 2017-11-15 DIAGNOSIS — F329 Major depressive disorder, single episode, unspecified: Secondary | ICD-10-CM | POA: Insufficient documentation

## 2017-11-15 DIAGNOSIS — K219 Gastro-esophageal reflux disease without esophagitis: Secondary | ICD-10-CM | POA: Insufficient documentation

## 2017-11-15 DIAGNOSIS — Z1211 Encounter for screening for malignant neoplasm of colon: Secondary | ICD-10-CM

## 2017-11-15 DIAGNOSIS — Z1231 Encounter for screening mammogram for malignant neoplasm of breast: Secondary | ICD-10-CM

## 2017-11-15 MED ORDER — HYDROXYZINE HCL 25 MG PO TABS: 25.0000 mg | ORAL_TABLET | Freq: Three times a day (TID) | ORAL | 3 refills | Status: DC | PRN

## 2017-11-15 MED ORDER — GABAPENTIN 300 MG PO CAPS: 300.0000 mg | ORAL_CAPSULE | Freq: Two times a day (BID) | ORAL | 3 refills | Status: DC

## 2017-11-15 MED ORDER — METHOCARBAMOL 500 MG PO TABS: 500.0000 mg | ORAL_TABLET | Freq: Three times a day (TID) | ORAL | 2 refills | Status: DC | PRN

## 2017-11-15 MED ORDER — DICLOFENAC SODIUM 75 MG PO TBEC: 75.0000 mg | DELAYED_RELEASE_TABLET | Freq: Two times a day (BID) | ORAL | 2 refills | Status: DC

## 2017-11-15 MED ORDER — FLUOXETINE HCL 20 MG PO TABS: 20.0000 mg | ORAL_TABLET | Freq: Every day | ORAL | 3 refills | Status: DC

## 2017-11-15 MED ORDER — TOPIRAMATE 100 MG PO TABS: ORAL_TABLET | ORAL | 6 refills | Status: DC

## 2017-11-15 NOTE — Patient Instructions (Signed)
Cervical Radiculopathy  Cervical radiculopathy means that a nerve in the neck is pinched or bruised. This can cause pain or loss of feeling (numbness) that runs from your neck to your arm and fingers.  Follow these instructions at home:  Managing pain  ? Take over-the-counter and prescription medicines only as told by your doctor.  ? If directed, put ice on the injured or painful area.  ? Put ice in a plastic bag.  ? Place a towel between your skin and the bag.  ? Leave the ice on for 20 minutes, 2?3 times per day.  ? If ice does not help, you can try using heat. Take a warm shower or warm bath, or use a heat pack as told by your doctor.  ? You may try a gentle neck and shoulder massage.  Activity  ? Rest as needed. Follow instructions from your doctor about any activities to avoid.  ? Do exercises as told by your doctor or physical therapist.  General instructions  ? If you were given a soft collar, wear it as told by your doctor.  ? Use a flat pillow when you sleep.  ? Keep all follow-up visits as told by your doctor. This is important.  Contact a doctor if:  ? Your condition does not improve with treatment.  Get help right away if:  ? Your pain gets worse and is not controlled with medicine.  ? You lose feeling or feel weak in your hand, arm, face, or leg.  ? You have a fever.  ? You have a stiff neck.  ? You cannot control when you poop or pee (have incontinence).  ? You have trouble with walking, balance, or talking.  This information is not intended to replace advice given to you by your health care provider. Make sure you discuss any questions you have with your health care provider.  Document Released: 06/29/2011 Document Revised: 12/16/2015 Document Reviewed: 09/03/2014  Elsevier Interactive Patient Education ? 2018 [employer]

## 2017-11-15 NOTE — Progress Notes (Signed)
Patient needs refill on Topamax

## 2017-11-15 NOTE — Progress Notes (Signed)
Subjective:  Patient ID: Monique Tyler, female    DOB: 09-28-1965  Age: 52 y.o. MRN: [id number]  CC: Medication Refill and Depression   HPI Monique Tyler  is a 52 year old right handed woman with a history of chronic migraines, right shoulder pain status post rotator cuff surgery in 07/2016, seasonal allergies last seen in the clinic in 02/2017 who presents today for a follow-up visit.  She complains of anxiety depression over recent family stressors.  Her husband relapsed into using drugs and alcohol after 20 years of being clean and also had sexual relationships with other women.  He is back home but is also suffering from depression and she has been under some stress as the major caregiver. She is also concerned about the possibility of being exposed to unknown STDs. This has caused her to cry a lot and has caused some sleeplessness with worsening of her migraines; denies suicidal ideation or intents. 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  Also complains of chronic right shoulder pain and neck pain which radiates to both upper extremities and associated numbness and tingling.  She was unable to return to see her surgeon last year as a result of lack of medical coverage but she now has medical coverage at this time.  She braids hair intermittently and has noticed worsening of the numbness in her right hand when she does as she is right-hand dominant. Also has cramping of her legs when she bends her legs to sleep and so has to sleep with her legs straightened out.  Past Medical History:  Diagnosis Date  . GERD (gastroesophageal reflux disease)   . Hypertension    pt denies a hx of HTN  . Kidney stones   . Lung nodules   . Migraine   . Vertigo     Past Surgical History:  Procedure Laterality Date  . CESAREAN SECTION    . MANDIBLE SURGERY    . TUBAL LIGATION      Allergies  Allergen Reactions  . Shellfish Allergy Swelling  . Pork-Derived Products  Nausea And Vomiting    Outpatient Medications Prior to Visit  Medication Sig Dispense Refill  . topiramate (TOPAMAX) 100 MG tablet 200 mg in am 100 mg in pm 45 tablet 0  . albuterol (PROVENTIL HFA;VENTOLIN HFA) 108 (90 Base) MCG/ACT inhaler Inhale 2 puffs into the lungs every 4 (four) hours as needed for wheezing or shortness of breath. (Patient not taking: Reported on 11/15/2017) 1 Inhaler 1  . azithromycin (ZITHROMAX) 250 MG tablet Take 2 today then 1 daily (Patient not taking: Reported on 11/15/2017) 6 tablet 0  . benzonatate (TESSALON) 100 MG capsule Take 1 capsule (100 mg total) by mouth 3 (three) times daily as needed for cough. (Patient not taking: Reported on 11/15/2017) 30 capsule 0  . cetirizine (ZYRTEC) 10 MG tablet Take 1 tablet (10 mg total) by mouth daily. (Patient not taking: Reported on 11/15/2017) 30 tablet 3  . fluticasone (FLONASE) 50 MCG/ACT nasal spray Place 2 sprays into both nostrils daily. (Patient not taking: Reported on 11/15/2017) 16 g 6  . ondansetron (ZOFRAN ODT) 4 MG disintegrating tablet Take 1 tablet (4 mg total) by mouth every 8 (eight) hours as needed for nausea or vomiting. (Patient not taking: Reported on 11/15/2017) 20 tablet 0  . traMADol (ULTRAM) 50 MG tablet Take 1 tablet (50 mg total) by mouth every 6 (six) hours as needed. (Patient not taking: Reported on 11/15/2017) 30 tablet 1  .  diclofenac (VOLTAREN) 75 MG EC tablet Take 1 tablet (75 mg total) by mouth 2 (two) times daily. (Patient not taking: Reported on 11/15/2017) 20 tablet 0  . methocarbamol (ROBAXIN) 500 MG tablet Take 1 tablet (500 mg total) by mouth 4 (four) times daily. (Patient not taking: Reported on 11/15/2017) 60 tablet 2   No facility-administered medications prior to visit.     ROS Review of Systems  Constitutional: Negative for activity change, appetite change and fatigue.  HENT: Negative for congestion, sinus pressure and sore throat.   Eyes: Negative for visual disturbance.  Respiratory:  Negative for cough, chest tightness, shortness of breath and wheezing.   Cardiovascular: Negative for chest pain and palpitations.  Gastrointestinal: Negative for abdominal distention, abdominal pain and constipation.  Endocrine: Negative for polydipsia.  Genitourinary: Negative for dysuria and frequency.  Musculoskeletal: Negative for back pain.       See hpi  Skin: Negative for rash.  Neurological: Positive for headaches. Negative for tremors, light-headedness and numbness.  Hematological: Does not bruise/bleed easily.  Psychiatric/Behavioral: Positive for dysphoric mood and sleep disturbance. Negative for agitation and behavioral problems.    Objective:  BP 127/86   Pulse 77   Temp 98.7 F (37.1 C) (Oral)   Ht 5\' 7"  (1.702 m)   Wt 251 lb 9.6 oz (114.1 kg)   LMP 01/18/2012   SpO2 99%   BMI 39.41 kg/m   BP/Weight 11/15/2017 07/19/2017 06/25/2017  Systolic BP 127 123 119  Diastolic BP 86 70 65  Wt. (Lbs) 251.6 254 250  BMI 39.41 39.78 39.16      Physical Exam  Constitutional: She is oriented to person, place, and time. She appears well-developed and well-nourished.  Cardiovascular: Normal rate, normal heart sounds and intact distal pulses.  No murmur heard. Pulmonary/Chest: Effort normal and breath sounds normal. She has no wheezes. She has no rales. She exhibits no tenderness.  Abdominal: Soft. Bowel sounds are normal. She exhibits no distension and no mass. There is no tenderness.  Musculoskeletal: She exhibits tenderness (TTP of R shoulder with active abduction limited to  90 degrees).  Neurological: She is alert and oriented to person, place, and time. She displays normal reflexes. No sensory deficit. She exhibits normal muscle tone.  Strength in both hands is normal  Skin: Skin is warm and dry.  Psychiatric: She has a normal mood and affect.  Dysphoric mood    [facility] 11/15/2017 07/19/2017 02/28/2017  Decreased Interest 1 0 0  Down, Depressed,  Hopeless 1 0 0  PHQ - 2 Score 2 0 0  Altered sleeping 1 2 0  Tired, decreased energy 1 2 0  Change in appetite 1 1 0  Feeling bad or failure about yourself  1 0 0  Trouble concentrating 1 0 0  Moving slowly or fidgety/restless 0 0 0  Suicidal thoughts 0 0 0  PHQ-9 Score 7 5 0      Assessment & Plan:   1. Chronic migraine without aura without status migrainosus, not intractable Recently exacerbated by ongoing family stressors with frequent crying spells No regimen change today - topiramate (TOPAMAX) 100 MG tablet; 200 mg in am 100 mg in pm  Dispense: 45 tablet; Refill: 6  2. Status post right rotator cuff repair Uncontrolled Will obtain imaging and consider physical therapy Referred to orthopedics if symptoms persist - methocarbamol (ROBAXIN) 500 MG tablet; Take 1 tablet (500 mg total) by mouth every 8 (eight) hours as needed for muscle spasms.  Dispense: 60 tablet; Refill: 2 - Ambulatory referral to Physical Therapy - DG Shoulder Right; Future  3. Screening for breast cancer - MM Digital Screening; Future  4. Screening for colon cancer - Ambulatory referral to Gastroenterology  5. Screening for STD (sexually transmitted disease) - HEP, RPR, HIV Panel - Urine cytology ancillary only  6. Anxiety and depression Due to underlying family stressor Commenced on Prozac and hydroxyzine; hopefully sedating side effect of hydroxyzine will help with insomnia LCSW called in for counseling  7. Cervical radiculopathy - Ambulatory referral to Physical Therapy - DG Cervical Spine Complete; Future   Meds ordered this encounter  Medications  . topiramate (TOPAMAX) 100 MG tablet    Sig: 200 mg in am 100 mg in pm    Dispense:  45 tablet    Refill:  6  . diclofenac (VOLTAREN) 75 MG EC tablet    Sig: Take 1 tablet (75 mg total) by mouth 2 (two) times daily.    Dispense:  60 tablet    Refill:  2  . methocarbamol (ROBAXIN) 500 MG tablet    Sig: Take 1 tablet (500 mg total) by mouth  every 8 (eight) hours as needed for muscle spasms.    Dispense:  60 tablet    Refill:  2  . FLUoxetine (PROZAC) 20 MG tablet    Sig: Take 1 tablet (20 mg total) by mouth daily.    Dispense:  30 tablet    Refill:  3  . hydrOXYzine (ATARAX/VISTARIL) 25 MG tablet    Sig: Take 1 tablet (25 mg total) by mouth 3 (three) times daily as needed.    Dispense:  90 tablet    Refill:  3  . gabapentin (NEURONTIN) 300 MG capsule    Sig: Take 1 capsule (300 mg total) by mouth 2 (two) times daily.    Dispense:  60 capsule    Refill:  3    Follow-up: Return in about 6 months (around 05/17/2018) for follow up of chronic medical conditions.   Hoy Register MD

## 2017-11-15 NOTE — BH Specialist Note (Signed)
Integrated Behavioral Health Initial Visit  MRN: [id number] Name: Monique Tyler  Number of Integrated Behavioral Health Clinician visits:: 1/6 Session Start time: 11:00 AM  Session End time: 11:40 AM Total time: 40 minutes  Type of Service: Integrated Behavioral Health- Individual/Family Interpretor:No. Interpretor Name and Language: N/A   Warm Hand Off Completed.       SUBJECTIVE: Monique Tyler is a 52 y.o. female accompanied by self Patient was referred by Dr. Alvis LemmingsNewlin for depression and anxiety. Patient reports the following symptoms/concerns: feelings of sadness and worry, low energy, difficulty sleeping, decreased concentration, restlessness, and irritability Duration of problem: 1 month; Severity of problem: mild  OBJECTIVE: Mood: Pleasant and Affect: Appropriate Risk of harm to self or others: No plan to harm self or others  LIFE CONTEXT: Family and Social: Pt receives strong support from family and adult children School/Work: Pt is employed full time Self-Care: No report of substance use Life Changes: Pt recently learned that spouse has substance use hx and has been unfaithful during their marriage.    GOALS ADDRESSED: Patient will: 1. Reduce symptoms of: anxiety, depression and stress 2. Increase knowledge and/or ability of: coping skills and healthy habits  3. Demonstrate ability to: Increase healthy adjustment to current life circumstances and Increase adequate support systems for patient/family  INTERVENTIONS: Interventions utilized: Solution-Focused Strategies, Supportive Counseling and Psychoeducation and/or Health Education  Standardized Assessments completed: GAD-7 and PHQ 2&9  ASSESSMENT: Patient currently experiencing depression and anxiety triggered by conflict in the marriage. She reports feelings of sadness and worry, low energy, difficulty sleeping, decreased concentration, restlessness, and irritability. She receives strong support from family and  friends.    Patient may benefit from psychotherapy. Pt had good insight on correlation between one's physical and mental health, in addition, to how stress can negatively impact health. Therapeutic interventions were discussed to decrease symptoms and promote positive feelings. Pt identified healthy strategies to promote self-care. Supportive resources were provided.  PLAN: 1. Follow up with behavioral health clinician on : Pt was encouraged to contact [facility]CSWA if symptoms worsen or fail to improve [facility] 2. Behavioral recommendations: LCSWA recommends that pt apply healthy coping skills discussed. Pt is encouraged to schedule follow up appointment with LCSWA 3. Referral(s): [facility] 4. "From scale of 1-10, how likely are you to follow plan?": 10/10  Bridgett LarssonJasmine D Zehava Turski, LCSW 11/16/17 4:10 PM

## 2017-11-16 ENCOUNTER — Ambulatory Visit: Payer: Self-pay | Attending: Family Medicine

## 2017-11-16 DIAGNOSIS — G43709 Chronic migraine without aura, not intractable, without status migrainosus: Secondary | ICD-10-CM | POA: Insufficient documentation

## 2017-11-16 LAB — URINE CYTOLOGY ANCILLARY ONLY
CHLAMYDIA, DNA PROBE: NEGATIVE
NEISSERIA GONORRHEA: NEGATIVE
Trichomonas: NEGATIVE

## 2017-11-16 LAB — HEP, RPR, HIV PANEL
HEP B S AG: NEGATIVE
HIV SCREEN 4TH GENERATION: NONREACTIVE
RPR: NONREACTIVE

## 2017-11-16 NOTE — Progress Notes (Signed)
Patient here for lab visit only 

## 2017-11-17 LAB — LIPID PANEL
Chol/HDL Ratio: 5.3 ratio — ABNORMAL HIGH (ref 0.0–4.4)
Cholesterol, Total: 206 mg/dL — ABNORMAL HIGH (ref 100–199)
HDL: 39 mg/dL — ABNORMAL LOW (ref >39)
LDL CALC: 147 mg/dL — AB (ref 0–99)
TRIGLYCERIDES: 101 mg/dL (ref 0–149)
VLDL Cholesterol Cal: 20 mg/dL (ref 5–40)

## 2017-11-17 LAB — URINE CYTOLOGY ANCILLARY ONLY
BACTERIAL VAGINITIS: POSITIVE — AB
Candida vaginitis: POSITIVE — AB

## 2017-11-19 ENCOUNTER — Other Ambulatory Visit: Payer: Self-pay | Admitting: Family Medicine

## 2017-11-19 MED ORDER — METRONIDAZOLE 0.75 % VA GEL: 1.0000 | Freq: Every day | VAGINAL | 0 refills | Status: DC

## 2017-11-19 MED ORDER — FLUCONAZOLE 150 MG PO TABS
150.0000 mg | ORAL_TABLET | Freq: Once | ORAL | 0 refills | Status: AC
Start: 2017-11-19 — End: 2017-11-19

## 2017-11-21 ENCOUNTER — Telehealth: Payer: Self-pay

## 2017-11-21 NOTE — Telephone Encounter (Signed)
Patient was called and informed of lab results. 

## 2017-12-18 ENCOUNTER — Encounter: Payer: Self-pay | Admitting: Family Medicine

## 2018-01-27 IMAGING — CR DG CHEST 2V
2 series · 2 of 2 positions shown · non-contrast
Comparison: 06/18/2015

CLINICAL DATA: Cough, congestion, and fever for 48 hours.

EXAM:
CHEST  2 VIEW

[w chest pa]
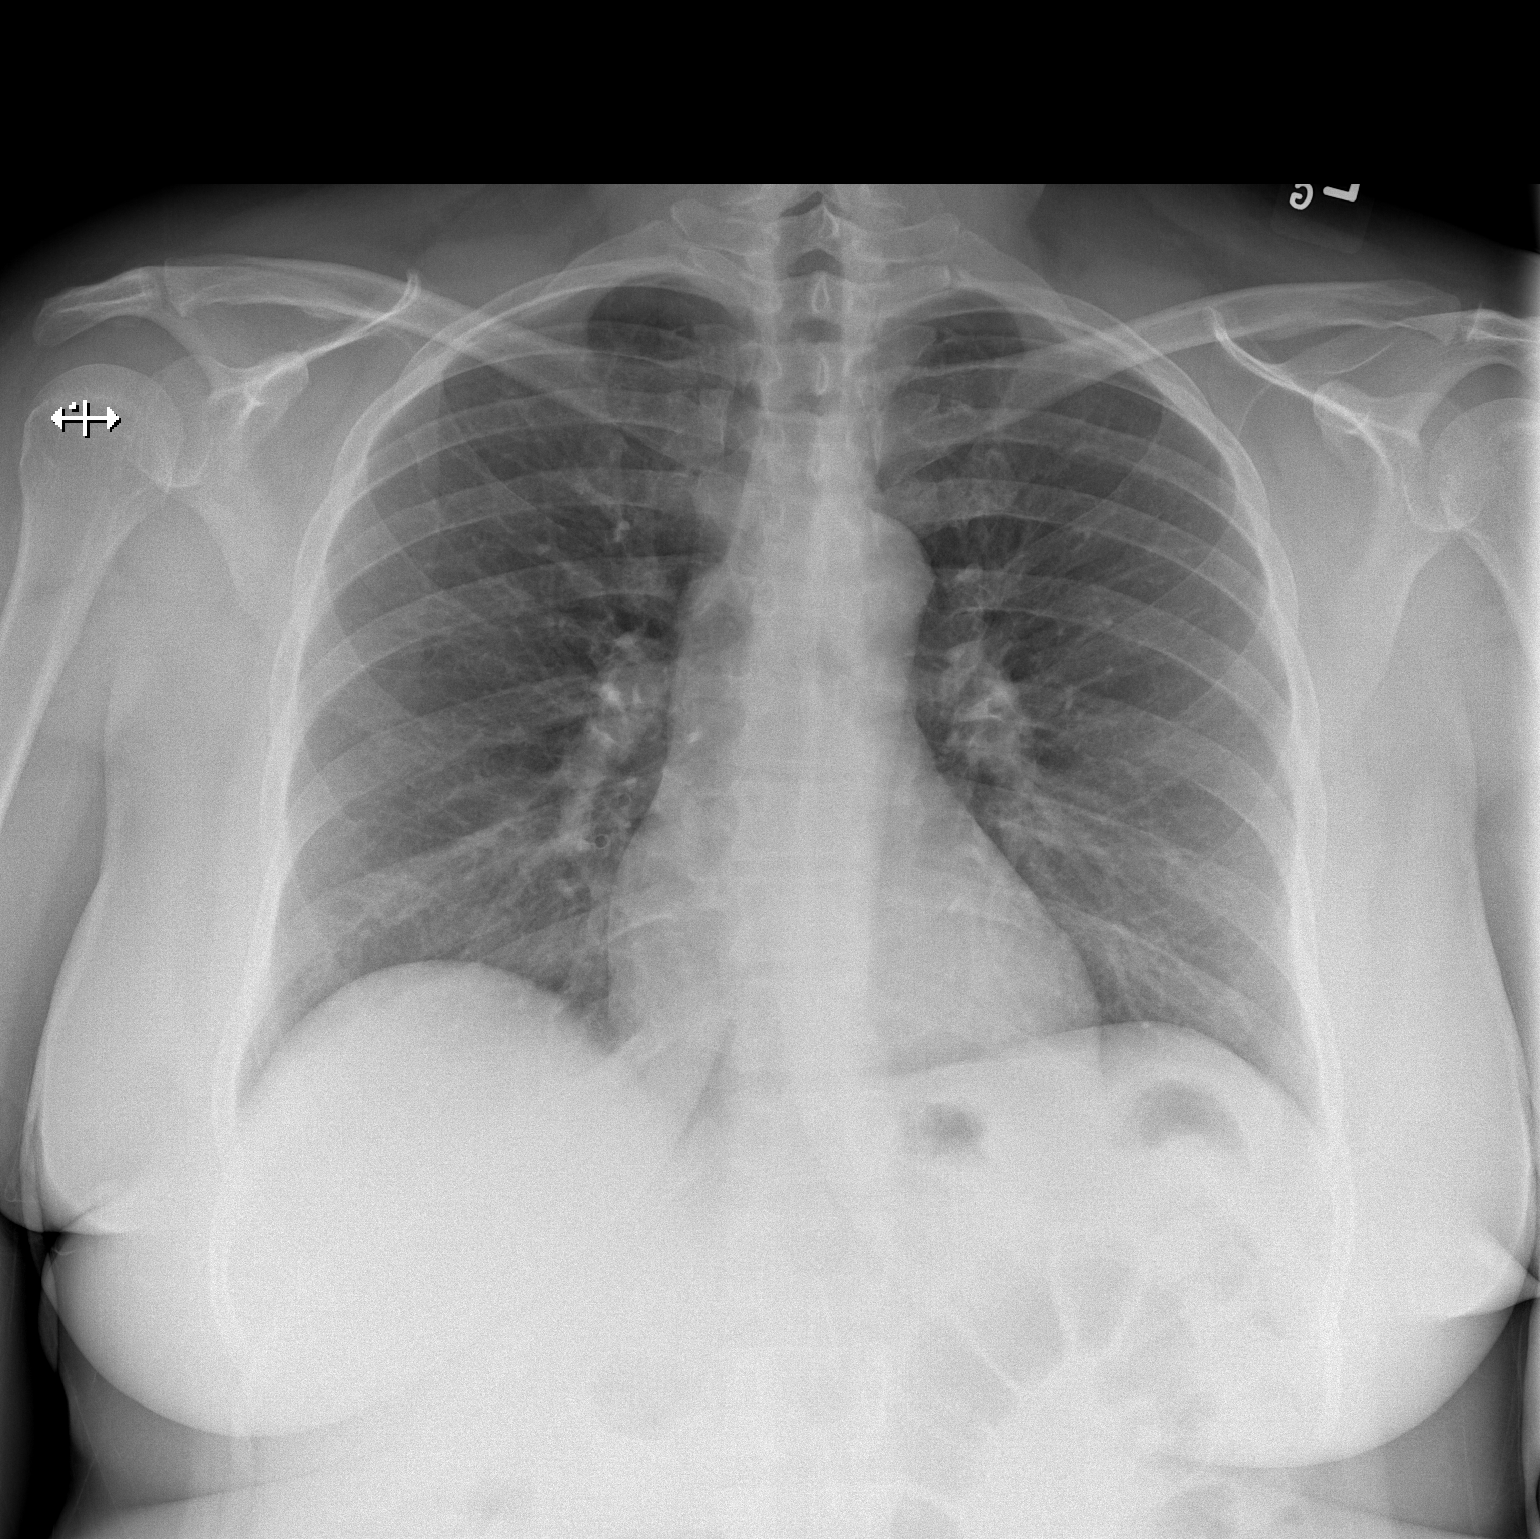

[w chest lat]
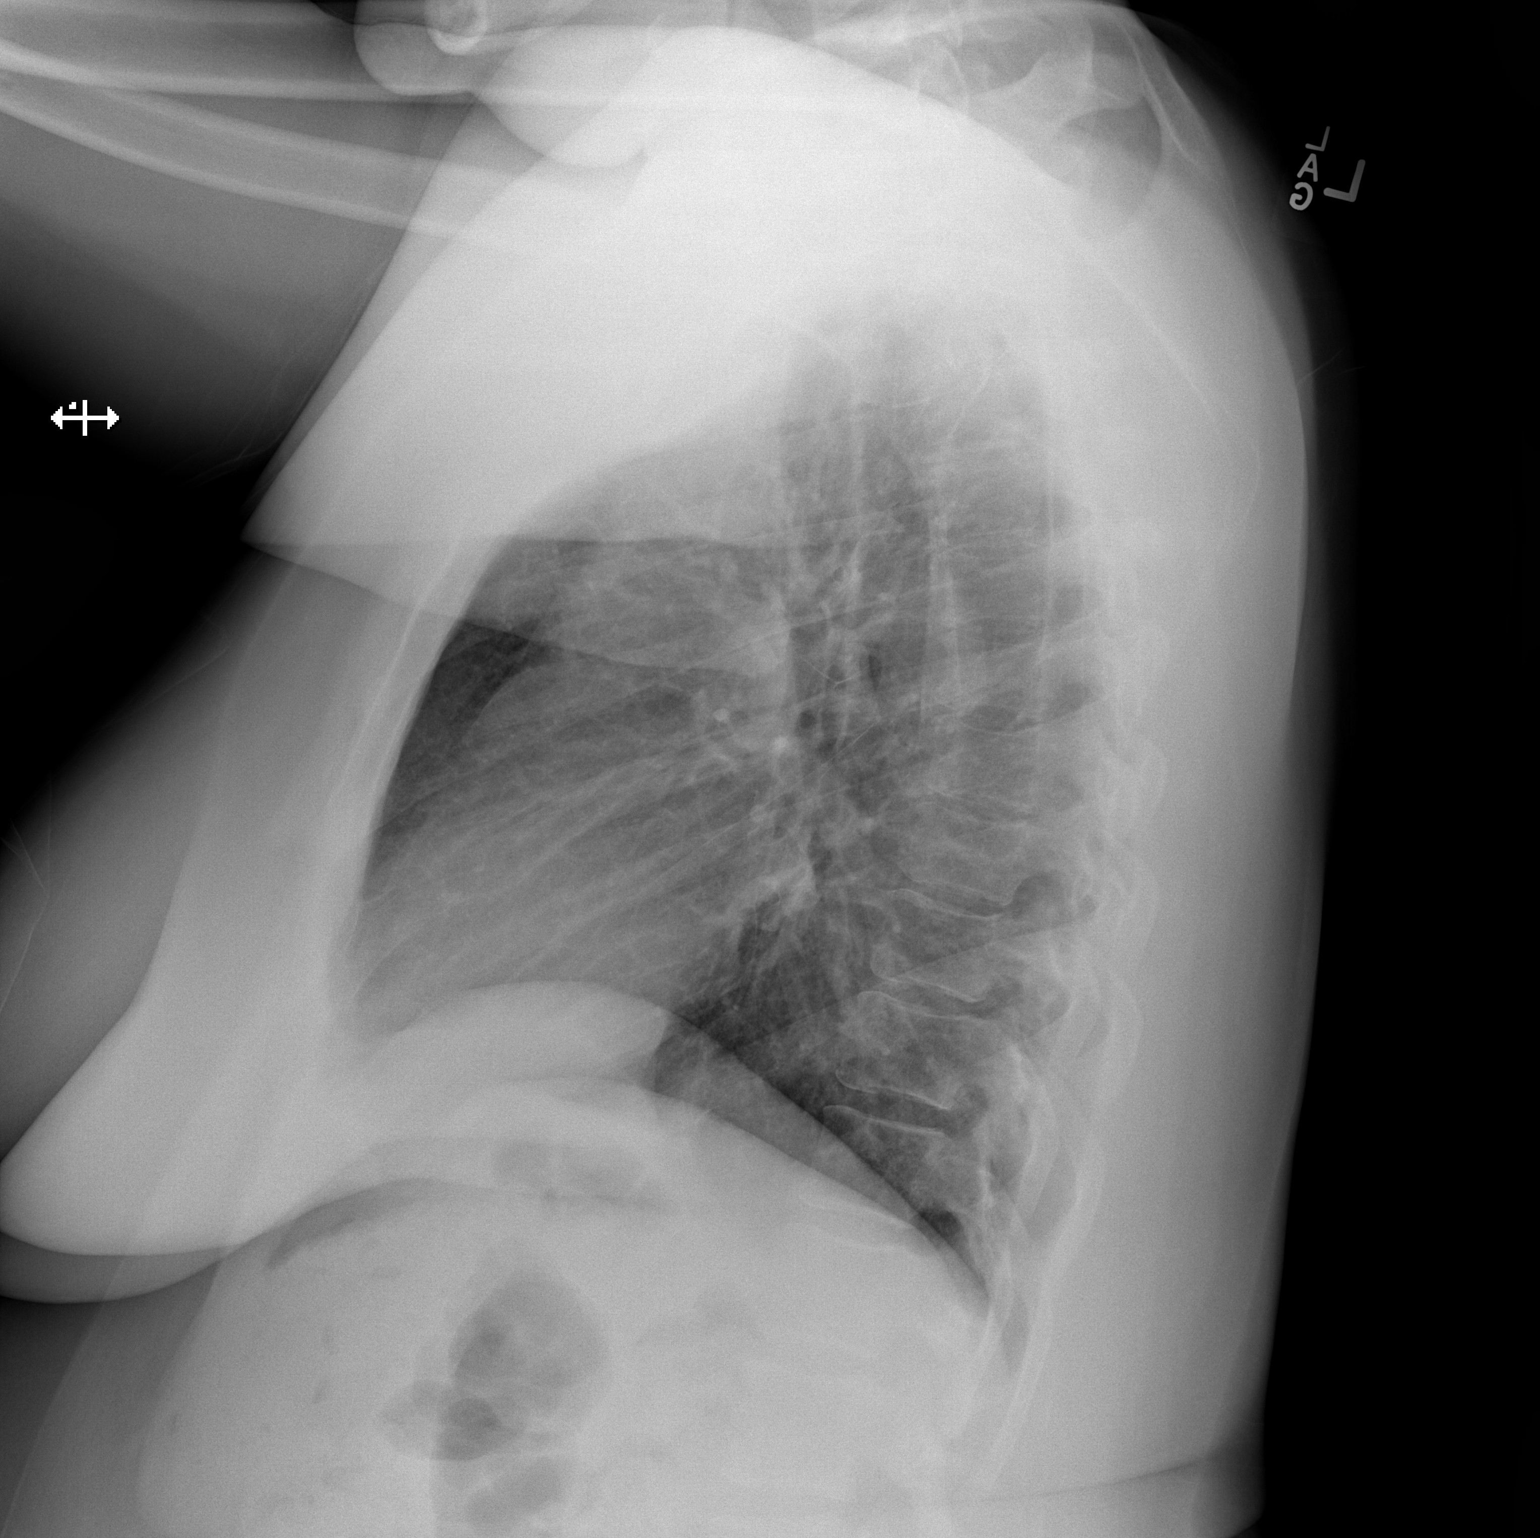

[2 of 2 positions shown; findings below may reference images not displayed]

FINDINGS: The heart size and mediastinal contours are within normal limits.
Both lungs are clear. The visualized skeletal structures are
unremarkable.
IMPRESSION: No active cardiopulmonary disease.

## 2018-02-14 ENCOUNTER — Ambulatory Visit: Payer: 59 | Attending: Family Medicine | Admitting: Physician Assistant

## 2018-02-14 VITALS — BP 132/84 | HR 71 | Temp 98.4°F | Resp 18 | Ht 67.0 in | Wt 253.0 lb

## 2018-02-14 DIAGNOSIS — F419 Anxiety disorder, unspecified: Secondary | ICD-10-CM | POA: Diagnosis not present

## 2018-02-14 DIAGNOSIS — K219 Gastro-esophageal reflux disease without esophagitis: Secondary | ICD-10-CM | POA: Insufficient documentation

## 2018-02-14 DIAGNOSIS — R918 Other nonspecific abnormal finding of lung field: Secondary | ICD-10-CM | POA: Diagnosis not present

## 2018-02-14 DIAGNOSIS — Z87442 Personal history of urinary calculi: Secondary | ICD-10-CM | POA: Diagnosis not present

## 2018-02-14 DIAGNOSIS — G43709 Chronic migraine without aura, not intractable, without status migrainosus: Secondary | ICD-10-CM | POA: Diagnosis not present

## 2018-02-14 DIAGNOSIS — I10 Essential (primary) hypertension: Secondary | ICD-10-CM | POA: Insufficient documentation

## 2018-02-14 DIAGNOSIS — Z9889 Other specified postprocedural states: Secondary | ICD-10-CM

## 2018-02-14 DIAGNOSIS — M25519 Pain in unspecified shoulder: Secondary | ICD-10-CM | POA: Insufficient documentation

## 2018-02-14 DIAGNOSIS — Z79899 Other long term (current) drug therapy: Secondary | ICD-10-CM | POA: Diagnosis not present

## 2018-02-14 DIAGNOSIS — J302 Other seasonal allergic rhinitis: Secondary | ICD-10-CM | POA: Insufficient documentation

## 2018-02-14 DIAGNOSIS — J40 Bronchitis, not specified as acute or chronic: Secondary | ICD-10-CM | POA: Diagnosis not present

## 2018-02-14 DIAGNOSIS — R5383 Other fatigue: Secondary | ICD-10-CM

## 2018-02-14 DIAGNOSIS — J3089 Other allergic rhinitis: Secondary | ICD-10-CM

## 2018-02-14 MED ORDER — HYDROXYZINE HCL 25 MG PO TABS: 25.0000 mg | ORAL_TABLET | Freq: Three times a day (TID) | ORAL | 3 refills | Status: DC | PRN

## 2018-02-14 MED ORDER — DICLOFENAC SODIUM 75 MG PO TBEC: 75.0000 mg | DELAYED_RELEASE_TABLET | Freq: Two times a day (BID) | ORAL | 2 refills | Status: DC

## 2018-02-14 MED ORDER — TOPIRAMATE 100 MG PO TABS: ORAL_TABLET | ORAL | 6 refills | Status: DC

## 2018-02-14 MED ORDER — GABAPENTIN 300 MG PO CAPS: 300.0000 mg | ORAL_CAPSULE | Freq: Two times a day (BID) | ORAL | 3 refills | Status: DC

## 2018-02-14 MED ORDER — ONDANSETRON 4 MG PO TBDP: 4.0000 mg | ORAL_TABLET | Freq: Three times a day (TID) | ORAL | 0 refills | Status: DC | PRN

## 2018-02-14 MED ORDER — ALBUTEROL SULFATE HFA 108 (90 BASE) MCG/ACT IN AERS: 2.0000 | INHALATION_SPRAY | RESPIRATORY_TRACT | 1 refills | Status: DC | PRN

## 2018-02-14 MED ORDER — FLUTICASONE PROPIONATE 50 MCG/ACT NA SUSP: 2.0000 | Freq: Every day | NASAL | 6 refills | Status: DC

## 2018-02-14 MED ORDER — METHOCARBAMOL 500 MG PO TABS: 500.0000 mg | ORAL_TABLET | Freq: Three times a day (TID) | ORAL | 2 refills | Status: DC | PRN

## 2018-02-14 NOTE — Progress Notes (Signed)
Patient ID: Monique DupreBeth Bettcher, female   DOB: 1965/08/29, 52 y.o.   MRN: [id number]   Monique DupreBeth Collymore, is a 52 y.o. female  NWG:[id number]  [id number]  DOB - 1965/08/29  Subjective:  Chief Complaint and HPI: Monique Tyler is a 52 y.o. female here today for f/up on ongoing medical issues.  She never went to PT for shoulder issues. Still has pain and uses methocarbamol and voltaren for pain prn.  She does not take this daily.  Migraines are much improved on topamax.    Not taking prozac.  Didn't get it filled.  Anxiety seems ok with gabapentin and hydroxyzine prn.   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  Has reconciled in the spring with husband after relapse.     ROS:   Constitutional:  No f/c, No night sweats, No unexplained weight loss. EENT:  No vision changes, No blurry vision, No hearing changes. No mouth, throat, or ear problems.  Respiratory: No cough, No SOB Cardiac: No CP, no palpitations GI:  No abd pain, No V/D. +occasional nausea with HA GU: No Urinary s/sx Musculoskeletal: No joint pain Neuro: headaches improved, no dizziness, no motor weakness.  Skin: No rash Endocrine:  No polydipsia. No polyuria.  Psych: Denies SI/HI.  Anxiety and depression stable on meds  No problems updated.  ALLERGIES: Allergies  Allergen Reactions  . Shellfish Allergy Swelling  . Pork-Derived Products Nausea And Vomiting    PAST MEDICAL HISTORY: Past Medical History:  Diagnosis Date  . GERD (gastroesophageal reflux disease)   . Hypertension    pt denies a hx of HTN  . Kidney stones   . Lung nodules   . Migraine   . Vertigo     MEDICATIONS AT HOME: Prior to Admission medications   Medication Sig Start Date End Date Taking? Authorizing Provider  albuterol (PROVENTIL HFA;VENTOLIN HFA) 108 (90 Base) MCG/ACT inhaler Inhale 2 puffs into the lungs every 4 (four) hours as needed for wheezing or shortness of breath. 02/14/18  Yes Mycah Formica M, PA-C    cetirizine (ZYRTEC) 10 MG tablet Take 1 tablet (10 mg total) by mouth daily. 02/28/17  Yes Hoy RegisterNewlin, Enobong, MD  diclofenac (VOLTAREN) 75 MG EC tablet Take 1 tablet (75 mg total) by mouth 2 (two) times daily. 02/14/18  Yes Ailen Strauch M, PA-C  fluticasone (FLONASE) 50 MCG/ACT nasal spray Place 2 sprays into both nostrils daily. 02/14/18  Yes Anders SimmondsMcClung, Andrika Peraza M, PA-C  gabapentin (NEURONTIN) 300 MG capsule Take 1 capsule (300 mg total) by mouth 2 (two) times daily. 02/14/18  Yes Anders SimmondsMcClung, Oz Gammel M, PA-C  hydrOXYzine (ATARAX/VISTARIL) 25 MG tablet Take 1 tablet (25 mg total) by mouth 3 (three) times daily as needed. 02/14/18  Yes Tonjia Parillo, Marzella SchleinAngela M, PA-C  methocarbamol (ROBAXIN) 500 MG tablet Take 1 tablet (500 mg total) by mouth every 8 (eight) hours as needed for muscle spasms. 02/14/18  Yes Georgian CoMcClung, Ollie Esty M, PA-C  ondansetron (ZOFRAN ODT) 4 MG disintegrating tablet Take 1 tablet (4 mg total) by mouth every 8 (eight) hours as needed for nausea or vomiting. 02/14/18  Yes Georgian CoMcClung, Rori Goar M, PA-C  topiramate (TOPAMAX) 100 MG tablet 200 mg in am 100 mg in pm 02/14/18  Yes Glendy Barsanti, Marzella SchleinAngela M, PA-C     Objective:  EXAM:   Vitals:   02/14/18 0900  BP: 132/84  Pulse: 71  Resp: 18  Temp: 98.4 F (36.9 C)  TempSrc: Oral  SpO2: 97%  Weight: 253 lb (114.8 kg)  Height: 5\' 7"  (1.702 m)    General appearance : A&OX3. NAD. Non-toxic-appearing HEENT: Atraumatic and Normocephalic.  PERRLA. EOM intact.   Neck: supple, no JVD. No cervical lymphadenopathy. No thyromegaly Chest/Lungs:  Breathing-non-labored, Good air entry bilaterally, breath sounds normal without rales, rhonchi, or wheezing  CVS: S1 S2 regular, no murmurs, gallops, rubs  Extremities: Bilateral Lower Ext shows no edema, both legs are warm to touch with = pulse throughout Neurology:  CN II-XII grossly intact, Non focal.   Psych:  TP linear. J/I WNL. Normal speech. Appropriate eye contact and affect.  Skin:  No Rash  Data Review Lab Results   Component Value Date   HGBA1C  12/09/2007    4.6 (NOTE)   The ADA recommends the following therapeutic goals for glycemic   control related to Hgb A1C measurement:   Goal of Therapy:   < 7.0% Hgb A1C   Action Suggested:  > 8.0% Hgb A1C   Ref:  Diabetes Care, 22, Suppl. 1, 1999     Assessment & Plan   1. Chronic migraine without aura without status migrainosus, not intractable Stable/much improved - topiramate (TOPAMAX) 100 MG tablet; 200 mg in am 100 mg in pm  Dispense: 45 tablet; Refill: 6 - ondansetron (ZOFRAN ODT) 4 MG disintegrating tablet; Take 1 tablet (4 mg total) by mouth every 8 (eight) hours as needed for nausea or vomiting.  Dispense: 20 tablet; Refill: 0 - Comprehensive metabolic panel  2. Status post right rotator cuff repair Stable/not worsened - methocarbamol (ROBAXIN) 500 MG tablet; Take 1 tablet (500 mg total) by mouth every 8 (eight) hours as needed for muscle spasms.  Dispense: 60 tablet; Refill: 2 - diclofenac (VOLTAREN) 75 MG EC tablet; Take 1 tablet (75 mg total) by mouth 2 (two) times daily.  Dispense: 60 tablet; Refill: 2    3. Seasonal allergic rhinitis due to other allergic trigger - fluticasone (FLONASE) 50 MCG/ACT nasal spray; Place 2 sprays into both nostrils daily.  Dispense: 16 g; Refill: 6 - albuterol (PROVENTIL HFA;VENTOLIN HFA) 108 (90 Base) MCG/ACT inhaler; Inhale 2 puffs into the lungs every 4 (four) hours as needed for wheezing or shortness of breath.  Dispense: 1 Inhaler; Refill: 1  4. Anxiety Stable on meds.  Will have Jenel Lucks f/up with her as the patient did not want to meet with her today.  I believe she could benefit from self-care.  I have encouraged her with this - hydrOXYzine (ATARAX/VISTARIL) 25 MG tablet; Take 1 tablet (25 mg total) by mouth 3 (three) times daily as needed.  Dispense: 90 tablet; Refill: 3 - gabapentin (NEURONTIN) 300 MG capsule; Take 1 capsule (300 mg total) by mouth 2 (two) times daily.  Dispense: 60 capsule; Refill:  3  6. Fatigue, unspecified type - TSH - CBC with Differential/Platelet - Vitamin D, 25-hydroxy -needs to practice self-care   Patient have been counseled extensively about nutrition and exercise  Return in about 6 months (around 08/17/2018) for Dr Alvis Lemmings f/up anxiety/migraines.  The patient was given clear instructions to go to ER or return to medical center if symptoms don't improve, worsen or new problems develop. The patient verbalized understanding. The patient was told to call to get lab results if they haven't heard anything in the next week.     Georgian Co, PA-C [facility] [employer], [place] [phone]   02/14/2018, 9:12 AM

## 2018-02-15 LAB — CBC WITH DIFFERENTIAL/PLATELET
BASOS: 0 %
Basophils Absolute: 0 10*3/uL (ref 0.0–0.2)
EOS (ABSOLUTE): 0.1 10*3/uL (ref 0.0–0.4)
EOS: 2 %
HEMATOCRIT: 42 % (ref 34.0–46.6)
HEMOGLOBIN: 12.8 g/dL (ref 11.1–15.9)
Immature Grans (Abs): 0 10*3/uL (ref 0.0–0.1)
Immature Granulocytes: 0 %
LYMPHS ABS: 1.9 10*3/uL (ref 0.7–3.1)
Lymphs: 34 %
MCH: 26.3 pg — ABNORMAL LOW (ref 26.6–33.0)
MCHC: 30.5 g/dL — AB (ref 31.5–35.7)
MCV: 86 fL (ref 79–97)
MONOCYTES: 9 %
Monocytes Absolute: 0.5 10*3/uL (ref 0.1–0.9)
NEUTROS ABS: 3 10*3/uL (ref 1.4–7.0)
Neutrophils: 55 %
Platelets: 306 10*3/uL (ref 150–450)
RBC: 4.86 x10E6/uL (ref 3.77–5.28)
RDW: 13 % (ref 12.3–15.4)
WBC: 5.6 10*3/uL (ref 3.4–10.8)

## 2018-02-15 LAB — COMPREHENSIVE METABOLIC PANEL
A/G RATIO: 1.5 (ref 1.2–2.2)
ALT: 20 IU/L (ref 0–32)
AST: 19 IU/L (ref 0–40)
Albumin: 4 g/dL (ref 3.5–5.5)
Alkaline Phosphatase: 139 IU/L — ABNORMAL HIGH (ref 39–117)
BUN / CREAT RATIO: 12 (ref 9–23)
BUN: 12 mg/dL (ref 6–24)
Bilirubin Total: 0.3 mg/dL (ref 0.0–1.2)
CALCIUM: 9.5 mg/dL (ref 8.7–10.2)
CO2: 19 mmol/L — AB (ref 20–29)
CREATININE: 0.99 mg/dL (ref 0.57–1.00)
Chloride: 108 mmol/L — ABNORMAL HIGH (ref 96–106)
GFR calc Af Amer: 75 mL/min/{1.73_m2} (ref >59)
GFR, EST NON AFRICAN AMERICAN: 65 mL/min/{1.73_m2} (ref >59)
GLOBULIN, TOTAL: 2.7 g/dL (ref 1.5–4.5)
Glucose: 104 mg/dL — ABNORMAL HIGH (ref 65–99)
Potassium: 4.3 mmol/L (ref 3.5–5.2)
SODIUM: 139 mmol/L (ref 134–144)
Total Protein: 6.7 g/dL (ref 6.0–8.5)

## 2018-02-15 LAB — TSH: TSH: 1.73 u[IU]/mL (ref 0.450–4.500)

## 2018-02-15 LAB — VITAMIN D 25 HYDROXY (VIT D DEFICIENCY, FRACTURES): Vit D, 25-Hydroxy: 27.1 ng/mL — ABNORMAL LOW (ref 30.0–100.0)

## 2018-02-19 ENCOUNTER — Telehealth: Payer: Self-pay | Admitting: *Deleted

## 2018-02-19 NOTE — Telephone Encounter (Signed)
-----   Message from Anders SimmondsAngela M McClung, [place]PA-C sent at 02/19/2018  7:48 AM EDT ----- Please call patient.  labwork overall unremarkable other than vitamin D being a little low.  Take 2000 units of vitamin D daily.  Drink more water.  Follow up as planned.  Thanks, Georgian CoAngela McClung, PA-C

## 2018-02-19 NOTE — Telephone Encounter (Signed)
Patient verified DOB Patient is aware of labs being unremarkable and needing to address slightly low vitamin d by taking 200 units OTC daily. Patient advised to increase water and follow up as planned. No further questions.

## 2018-02-20 ENCOUNTER — Other Ambulatory Visit: Payer: Self-pay

## 2018-02-20 ENCOUNTER — Encounter (HOSPITAL_COMMUNITY): Payer: Self-pay

## 2018-02-20 ENCOUNTER — Emergency Department (HOSPITAL_COMMUNITY): Payer: 59

## 2018-02-20 ENCOUNTER — Emergency Department (HOSPITAL_COMMUNITY)
Admission: EM | Admit: 2018-02-20 | Discharge: 2018-02-20 | Disposition: A | Discharge status: Home / Self Care | Payer: 59 | Attending: Emergency Medicine | Admitting: Emergency Medicine

## 2018-02-20 DIAGNOSIS — R079 Chest pain, unspecified: Secondary | ICD-10-CM | POA: Insufficient documentation

## 2018-02-20 DIAGNOSIS — I10 Essential (primary) hypertension: Secondary | ICD-10-CM | POA: Insufficient documentation

## 2018-02-20 DIAGNOSIS — Z87891 Personal history of nicotine dependence: Secondary | ICD-10-CM | POA: Diagnosis not present

## 2018-02-20 DIAGNOSIS — Z79899 Other long term (current) drug therapy: Secondary | ICD-10-CM | POA: Diagnosis not present

## 2018-02-20 DIAGNOSIS — R0789 Other chest pain: Secondary | ICD-10-CM

## 2018-02-20 LAB — CBC
HEMATOCRIT: 42.8 % (ref 36.0–46.0)
Hemoglobin: 12.9 g/dL (ref 12.0–15.0)
MCH: 26.3 pg (ref 26.0–34.0)
MCHC: 30.1 g/dL (ref 30.0–36.0)
MCV: 87.2 fL (ref 78.0–100.0)
Platelets: 273 10*3/uL (ref 150–400)
RBC: 4.91 MIL/uL (ref 3.87–5.11)
RDW: 13.2 % (ref 11.5–15.5)
WBC: 5.6 10*3/uL (ref 4.0–10.5)

## 2018-02-20 LAB — BASIC METABOLIC PANEL
Anion gap: 8 (ref 5–15)
BUN: 12 mg/dL (ref 6–20)
CO2: 23 mmol/L (ref 22–32)
Calcium: 9.6 mg/dL (ref 8.9–10.3)
Chloride: 110 mmol/L (ref 98–111)
Creatinine, Ser: 0.97 mg/dL (ref 0.44–1.00)
GFR calc non Af Amer: >60 mL/min (ref >60)
Glucose, Bld: 96 mg/dL (ref 70–99)
Potassium: 3.9 mmol/L (ref 3.5–5.1)
Sodium: 141 mmol/L (ref 135–145)

## 2018-02-20 LAB — D-DIMER, QUANTITATIVE (NOT AT ARMC): D DIMER QUANT: 1.42 ug{FEU}/mL — AB (ref 0.00–0.50)

## 2018-02-20 LAB — I-STAT BETA HCG BLOOD, ED (MC, WL, AP ONLY)

## 2018-02-20 LAB — I-STAT TROPONIN, ED
TROPONIN I, POC: 0 ng/mL (ref 0.00–0.08)
Troponin i, poc: 0 ng/mL (ref 0.00–0.08)

## 2018-02-20 MED ORDER — MORPHINE SULFATE (PF) 4 MG/ML IV SOLN
4.0000 mg | Freq: Once | INTRAVENOUS | Status: AC
  Administered 2018-02-20: 4 mg via INTRAVENOUS
  Filled 2018-02-20: qty 1

## 2018-02-20 MED ORDER — IOPAMIDOL (ISOVUE-370) INJECTION 76%
100.0000 mL | Freq: Once | INTRAVENOUS | Status: AC | PRN
  Administered 2018-02-20: 100 mL via INTRAVENOUS

## 2018-02-20 MED ORDER — IOPAMIDOL (ISOVUE-370) INJECTION 76%
INTRAVENOUS | Status: AC
  Filled 2018-02-20: qty 100

## 2018-02-20 MED ORDER — TRAMADOL HCL 50 MG PO TABS: 50.0000 mg | ORAL_TABLET | Freq: Four times a day (QID) | ORAL | 0 refills | Status: DC | PRN

## 2018-02-20 NOTE — ED Provider Notes (Signed)
[facility] EMERGENCY DEPARTMENT Provider Note   CSN: [id number] Arrival date & time: 02/20/18  1248     History   Chief Complaint Chief Complaint  Patient presents with  . Chest Pain    HPI Monique Tyler is a 52 y.o. female.  Patient complains of chest discomfort.  No shortness of breath no sweating  The history is provided by the patient. No language interpreter was used.  Chest Pain   This is a new problem. The current episode started 1 to 2 hours ago. The problem occurs constantly. The problem has not changed since onset.The pain is associated with movement. The pain is present in the substernal region. The pain is at a severity of 4/10. The pain is moderate. The quality of the pain is described as sharp. The pain does not radiate. The symptoms are aggravated by certain positions. Pertinent negatives include no abdominal pain, no back pain, no cough and no headaches.  Pertinent negatives for past medical history include no seizures.    Past Medical History:  Diagnosis Date  . GERD (gastroesophageal reflux disease)   . Hypertension    pt denies a hx of HTN  . Kidney stones   . Lung nodules   . Migraine   . Vertigo     Patient Active Problem List   Diagnosis Date Noted  . Status post right rotator cuff repair 02/28/2017  . Seasonal allergies 02/28/2017  . Migraine 11/30/2015  . Benign paroxysmal positional vertigo 11/30/2015  . Headache, migraine 09/23/2014  . Ankle sprain 09/23/2014  . Contusion of right knee 09/23/2014  . Wrist sprain 09/23/2014  . Chest pain 02/17/2014  . Avitaminosis D 11/28/2011    Past Surgical History:  Procedure Laterality Date  . CESAREAN SECTION    . MANDIBLE SURGERY    . TUBAL LIGATION       OB History    Gravida  5   Para  5   Term      Preterm      AB      Living        SAB      TAB      Ectopic      Multiple  1   Live Births               Home Medications    Prior to Admission  medications   Medication Sig Start Date End Date Taking? Authorizing Provider  albuterol (PROVENTIL HFA;VENTOLIN HFA) 108 (90 Base) MCG/ACT inhaler Inhale 2 puffs into the lungs every 4 (four) hours as needed for wheezing or shortness of breath. 02/14/18  Yes McClung, Angela M, PA-C  cetirizine (ZYRTEC) 10 MG tablet Take 1 tablet (10 mg total) by mouth daily. 02/28/17  Yes Hoy Register, MD  Cholecalciferol (VITAMIN D) 2000 units CAPS Take 2,000 Units by mouth daily.   Yes [provider]  diclofenac (VOLTAREN) 75 MG EC tablet Take 1 tablet (75 mg total) by mouth 2 (two) times daily. 02/14/18  Yes McClung, Angela M, PA-C  fluticasone (FLONASE) 50 MCG/ACT nasal spray Place 2 sprays into both nostrils daily. 02/14/18  Yes Anders Simmonds, PA-C  gabapentin (NEURONTIN) 300 MG capsule Take 1 capsule (300 mg total) by mouth 2 (two) times daily. 02/14/18  Yes Anders Simmonds, PA-C  hydrOXYzine (ATARAX/VISTARIL) 25 MG tablet Take 1 tablet (25 mg total) by mouth 3 (three) times daily as needed. 02/14/18  Yes Anders Simmonds, PA-C  methocarbamol (ROBAXIN)  500 MG tablet Take 1 tablet (500 mg total) by mouth every 8 (eight) hours as needed for muscle spasms. 02/14/18  Yes Georgian Co M, PA-C  ondansetron (ZOFRAN ODT) 4 MG disintegrating tablet Take 1 tablet (4 mg total) by mouth every 8 (eight) hours as needed for nausea or vomiting. 02/14/18  Yes McClung, Angela M, PA-C  topiramate (TOPAMAX) 100 MG tablet 200 mg in am 100 mg in pm Patient taking differently: Take 100-200 mg by mouth See admin instructions. 200 mg in am 100 mg in pm 02/14/18  Yes McClung, Angela M, PA-C  traMADol (ULTRAM) 50 MG tablet Take 1 tablet (50 mg total) by mouth every 6 (six) hours as needed. 02/20/18   Bethann Berkshire, MD    Family History No family history on file.  Social History Social History   Tobacco Use  . Smoking status: Former Smoker    Types: Cigarettes  . Smokeless tobacco: Never Used  Substance Use Topics   . Alcohol use: No    Alcohol/week: 0.0 oz  . Drug use: No     Allergies   Shellfish allergy and Pork-derived products   Review of Systems Review of Systems  Constitutional: Negative for appetite change and fatigue.  HENT: Negative for congestion, ear discharge and sinus pressure.   Eyes: Negative for discharge.  Respiratory: Negative for cough.   Cardiovascular: Positive for chest pain.  Gastrointestinal: Negative for abdominal pain and diarrhea.  Genitourinary: Negative for frequency and hematuria.  Musculoskeletal: Negative for back pain.  Skin: Negative for rash.  Neurological: Negative for seizures and headaches.  Psychiatric/Behavioral: Negative for hallucinations.     Physical Exam Updated Vital Signs BP 122/79   Pulse 62   Temp 98.6 F (37 C) (Oral)   Resp 16   Ht 5\' 7"  (1.702 m)   Wt 114.8 kg (253 lb)   LMP 01/18/2012   SpO2 99%   BMI 39.63 kg/m   Physical Exam  Constitutional: She is oriented to person, place, and time. She appears well-developed.  HENT:  Head: Normocephalic.  Eyes: Conjunctivae and EOM are normal. No scleral icterus.  Neck: Neck supple. No thyromegaly present.  Cardiovascular: Normal rate and regular rhythm. Exam reveals no gallop and no friction rub.  No murmur heard. Pulmonary/Chest: No stridor. She has no wheezes. She has no rales. She exhibits tenderness.  Abdominal: She exhibits no distension. There is no tenderness. There is no rebound.  Musculoskeletal: Normal range of motion. She exhibits no edema.  Lymphadenopathy:    She has no cervical adenopathy.  Neurological: She is oriented to person, place, and time. She exhibits normal muscle tone. Coordination normal.  Skin: No rash noted. No erythema.  Psychiatric: She has a normal mood and affect. Her behavior is normal.     ED Treatments / Results  Labs (all labs ordered are listed, but only abnormal results are displayed) Labs Reviewed  D-DIMER, QUANTITATIVE (NOT AT  [facility]) - Abnormal; Notable for the following components:      Result Value   D-Dimer, Quant 1.42 (*)    All other components within normal limits  BASIC METABOLIC PANEL  CBC  I-STAT TROPONIN, ED  I-STAT BETA HCG BLOOD, ED ([facility], [facility], [facility] ONLY)  I-STAT TROPONIN, ED    EKG EKG Interpretation  Date/Time:  Wednesday February 20 2018 12:54:52 EDT Ventricular Rate:  73 PR Interval:  174 QRS Duration: 78 QT Interval:  388 QTC Calculation: 427 R Axis:   40 Text Interpretation:  Normal sinus  rhythm Normal ECG Confirmed by Bethann BerkshireZammit, Naiara Lombardozzi [phone]([id number]) on 02/20/2018 3:36:06 PM   Radiology Dg Chest 2 View  Result Date: 02/20/2018 CLINICAL DATA:  Chest pain, dizziness, and shortness of breath beginning 2 hours ago. EXAM: CHEST - 2 VIEW COMPARISON:  PA and lateral chest x-ray of June 25, 2017 FINDINGS: The lungs are adequately inflated. There is no focal infiltrate. The heart and pulmonary vascularity are normal. The mediastinum is normal in width. The bony thorax is unremarkable. IMPRESSION: There is no pneumonia, CHF, nor other acute cardiopulmonary abnormality. Electronically Signed   By: David  SwazilandJordan M.D.   On: 02/20/2018 13:27   Ct Angio Chest Pe W And/or Wo Contrast  Result Date: 02/20/2018 CLINICAL DATA:  Chest pain EXAM: CT ANGIOGRAPHY CHEST WITH CONTRAST TECHNIQUE: Multidetector CT imaging of the chest was performed using the standard protocol during bolus administration of intravenous contrast. Multiplanar CT image reconstructions and MIPs were obtained to evaluate the vascular anatomy. CONTRAST:  100mL ISOVUE-370 IOPAMIDOL (ISOVUE-370) INJECTION 76% COMPARISON:  Chest x-ray 02/20/2018, CT chest 08/24/2016 FINDINGS: Cardiovascular: Satisfactory opacification of the pulmonary arteries to the segmental level. No evidence of pulmonary embolism. Normal heart size. No pericardial effusion. Nonaneurysmal aorta. No dissection is seen. Mediastinum/Nodes: Midline trachea. No thyroid mass. Small bilateral hilar  nodes, similar as compared with prior chest CT. Esophagus within normal limits. Subcarinal lymph node measures up to 13 mm. Lungs/Pleura: Lungs are clear. No pleural effusion or pneumothorax. Upper Abdomen: No acute abnormality. Musculoskeletal: No chest wall abnormality. No acute or significant osseous findings. Review of the MIP images confirms the above findings. IMPRESSION: 1. Negative for acute pulmonary embolus or aortic dissection 2. Clear lung fields 3. Similar appearance of mild hilar and mediastinal adenopathy. Electronically Signed   By: Jasmine PangKim  Fujinaga M.D.   On: 02/20/2018 19:21    Procedures Procedures (including critical care time)  Medications Ordered in ED Medications  iopamidol (ISOVUE-370) 76 % injection (has no administration in time range)  morphine 4 MG/ML injection 4 mg (4 mg Intravenous Given 02/20/18 1656)  morphine 4 MG/ML injection 4 mg (4 mg Intravenous Given 02/20/18 1901)  iopamidol (ISOVUE-370) 76 % injection 100 mL (100 mLs Intravenous Contrast Given 02/20/18 1841)     Initial Impression / Assessment and Plan / ED Course  I have reviewed the triage vital signs and the nursing notes.  Pertinent labs & imaging results that were available during my care of the patient were reviewed by me and considered in my medical decision making (see chart for details).     Patient with atypical chest pain.  Troponin x2 is negative EKG unremarkable chest x-ray normal CT angios shows no PE.   Patient most likely having chest wall pain she will be given some Ultram and will follow-up with her PCP  Final Clinical Impressions(s) / ED Diagnoses   Final diagnoses:  Atypical chest pain    ED Discharge Orders        Ordered    traMADol (ULTRAM) 50 MG tablet  Every 6 hours PRN     02/20/18 Thayer [id number]       Katriana Dortch, MD 02/20/18 2006

## 2018-02-20 NOTE — Discharge Instructions (Signed)
Follow-up with your family doctor next week for recheck. 

## 2018-02-20 NOTE — ED Triage Notes (Signed)
Pt states she had sudden onset of CP today (about 1230) while speaking with someone at work. Also c/o SOB and pain in left jaw and shoulder at the time.

## 2018-03-15 ENCOUNTER — Encounter: Payer: Self-pay | Admitting: Family Medicine

## 2018-03-15 ENCOUNTER — Ambulatory Visit: Payer: 59 | Attending: Family Medicine | Admitting: Family Medicine

## 2018-03-15 VITALS — BP 132/90 | HR 71 | Temp 98.7°F | Ht 67.0 in | Wt 248.7 lb

## 2018-03-15 DIAGNOSIS — Z87442 Personal history of urinary calculi: Secondary | ICD-10-CM | POA: Insufficient documentation

## 2018-03-15 DIAGNOSIS — R5383 Other fatigue: Secondary | ICD-10-CM | POA: Insufficient documentation

## 2018-03-15 DIAGNOSIS — K219 Gastro-esophageal reflux disease without esophagitis: Secondary | ICD-10-CM | POA: Insufficient documentation

## 2018-03-15 DIAGNOSIS — Z7951 Long term (current) use of inhaled steroids: Secondary | ICD-10-CM | POA: Insufficient documentation

## 2018-03-15 DIAGNOSIS — Z91018 Allergy to other foods: Secondary | ICD-10-CM | POA: Diagnosis not present

## 2018-03-15 DIAGNOSIS — R29818 Other symptoms and signs involving the nervous system: Secondary | ICD-10-CM

## 2018-03-15 DIAGNOSIS — Z91013 Allergy to seafood: Secondary | ICD-10-CM | POA: Insufficient documentation

## 2018-03-15 DIAGNOSIS — Z79899 Other long term (current) drug therapy: Secondary | ICD-10-CM | POA: Insufficient documentation

## 2018-03-15 DIAGNOSIS — R0789 Other chest pain: Secondary | ICD-10-CM | POA: Diagnosis not present

## 2018-03-15 DIAGNOSIS — R0683 Snoring: Secondary | ICD-10-CM | POA: Insufficient documentation

## 2018-03-15 DIAGNOSIS — F419 Anxiety disorder, unspecified: Secondary | ICD-10-CM | POA: Insufficient documentation

## 2018-03-15 DIAGNOSIS — G43909 Migraine, unspecified, not intractable, without status migrainosus: Secondary | ICD-10-CM | POA: Diagnosis not present

## 2018-03-15 DIAGNOSIS — R079 Chest pain, unspecified: Secondary | ICD-10-CM | POA: Diagnosis present

## 2018-03-15 MED ORDER — BUSPIRONE HCL 5 MG PO TABS: 5.0000 mg | ORAL_TABLET | Freq: Two times a day (BID) | ORAL | 3 refills | Status: DC

## 2018-03-15 NOTE — Patient Instructions (Signed)

## 2018-03-15 NOTE — Progress Notes (Signed)
Subjective:  Patient ID: Monique Tyler, female    DOB: 14-Aug-1965  Age: 52 y.o. MRN: [id number]  CC: Hospitalization Follow-up   HPI [name] is a 52 year old right handed woman with a history of chronic migraines, right shoulder pain status post rotator cuff surgery in 07/2016, seasonal allergies here for an ED follow-up where she was seen for chest pains 3 weeks ago. Troponins were negative, EKG unremarkable, CT angiogram of the chest negative for PE. Pain thought to be atypical and likely musculoskeletal. She presents today accompanied by her husband and informs me pain is still present and feels like a pressure on her chest wall; it is nonreproducible.  She has had a history of GERD in the past but this feels different from reflux symptoms.  Pain is unrelated to activity.  She endorses multiple nighttime awakenings with a pounding heart rate which wakes her up at night and she sometimes feels her BP is high on those occasions but has not actually checked it. She endorses daytime fatigue, snoring at night and has headaches which are relieved by Topamax but has not been evaluated for sleep apnea. 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  Past Medical History:  Diagnosis Date  . GERD (gastroesophageal reflux disease)   . Hypertension    pt denies a hx of HTN  . Kidney stones   . Lung nodules   . Migraine   . Vertigo     Past Surgical History:  Procedure Laterality Date  . CESAREAN SECTION    . MANDIBLE SURGERY    . TUBAL LIGATION      Allergies  Allergen Reactions  . Shellfish Allergy Swelling  . Pork-Derived Products Nausea And Vomiting     Outpatient Medications Prior to Visit  Medication Sig Dispense Refill  . albuterol (PROVENTIL HFA;VENTOLIN HFA) 108 (90 Base) MCG/ACT inhaler Inhale 2 puffs into the lungs every 4 (four) hours as needed for wheezing  or shortness of breath. 1 Inhaler 1  . cetirizine (ZYRTEC) 10 MG tablet Take 1 tablet (10 mg total) by mouth daily. 30 tablet 3  . Cholecalciferol (VITAMIN D) 2000 units CAPS Take 2,000 Units by mouth daily.    . diclofenac (VOLTAREN) 75 MG EC tablet Take 1 tablet (75 mg total) by mouth 2 (two) times daily. 60 tablet 2  . fluticasone (FLONASE) 50 MCG/ACT nasal spray Place 2 sprays into both nostrils daily. 16 g 6  . gabapentin (NEURONTIN) 300 MG capsule Take 1 capsule (300 mg total) by mouth 2 (two) times daily. 60 capsule 3  . hydrOXYzine (ATARAX/VISTARIL) 25 MG tablet Take 1 tablet (25 mg total) by mouth 3 (three) times daily as needed. 90 tablet 3  . methocarbamol (ROBAXIN) 500 MG tablet Take 1 tablet (500 mg total) by mouth every 8 (eight) hours as needed for muscle spasms. 60 tablet 2  . ondansetron (ZOFRAN ODT) 4 MG disintegrating tablet Take 1 tablet (4 mg total) by mouth every 8 (eight) hours as needed for nausea or vomiting. 20 tablet 0  . topiramate (TOPAMAX) 100 MG tablet 200 mg in am 100 mg in pm (Patient taking differently: Take 100-200 mg by mouth See admin instructions. 200 mg in am 100 mg in pm) 45 tablet 6  . traMADol (ULTRAM) 50 MG tablet Take 1 tablet (50 mg total) by mouth every 6 (six) hours as needed. 15 tablet 0  No facility-administered medications prior to visit.     ROS Review of Systems  Constitutional: Positive for fatigue. Negative for activity change and appetite change.  HENT: Negative for congestion, sinus pressure and sore throat.   Eyes: Negative for visual disturbance.  Respiratory: Negative for cough, chest tightness, shortness of breath and wheezing.   Cardiovascular: Positive for chest pain. Negative for palpitations.  Gastrointestinal: Negative for abdominal distention, abdominal pain and constipation.  Endocrine: Negative for polydipsia.  Genitourinary: Negative for dysuria and frequency.  Musculoskeletal: Negative for arthralgias and back pain.    Skin: Negative for rash.  Neurological: Negative for tremors, light-headedness and numbness.  Hematological: Does not bruise/bleed easily.  Psychiatric/Behavioral: Negative for agitation and behavioral problems.    Objective:  BP 132/90   Pulse 71   Temp 98.7 F (37.1 C) (Oral)   Ht 5\' 7"  (1.702 m)   Wt 248 lb 11.2 oz (112.8 kg)   LMP 01/18/2012   SpO2 100%   BMI 38.95 kg/m   BP/Weight 03/15/2018 02/20/2018 02/14/2018  Systolic BP 132 115 132  Diastolic BP 90 51 84  Wt. (Lbs) 248.7 253 253  BMI 38.95 39.63 39.63      Physical Exam  Constitutional: She is oriented to person, place, and time. She appears well-developed and well-nourished.  Cardiovascular: Normal rate, normal heart sounds and intact distal pulses.  No murmur heard. Pulmonary/Chest: Effort normal and breath sounds normal. She has no wheezes. She has no rales. She exhibits no tenderness.  Abdominal: Soft. Bowel sounds are normal. She exhibits no distension and no mass. There is no tenderness.  Musculoskeletal: Normal range of motion.  Neurological: She is alert and oriented to person, place, and time.  Skin: Skin is warm and dry.  Psychiatric: She has a normal mood and affect.     Assessment & Plan:   1. Other chest pain Negative troponins, EKG unremarkable She will need a stress test - Ambulatory referral to Cardiology  2. Suspected sleep apnea Could explain her multiple nighttime awakenings and fatigue - Split night study; Future  3. 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 - busPIRone (BUSPAR) 5 MG tablet; Take 1 tablet (5 mg total) by mouth 2 (two) times daily.  Dispense: 60 tablet; Refill: 3   Meds ordered this encounter  Medications  . busPIRone (BUSPAR) 5 MG tablet    Sig: Take 1 tablet (5 mg total) by mouth 2  (two) times daily.    Dispense:  60 tablet    Refill:  3    Follow-up: Return for Follow-up of chronic medical conditions, keep previously scheduled appointment.   Hoy RegisterEnobong Ved Martos MD

## 2018-03-20 ENCOUNTER — Telehealth: Payer: Self-pay | Admitting: Family Medicine

## 2018-03-20 MED ORDER — FLUOXETINE HCL 20 MG PO TABS: 20.0000 mg | ORAL_TABLET | Freq: Every day | ORAL | 3 refills | Status: DC

## 2018-03-20 NOTE — Telephone Encounter (Signed)
Patient called stating that busPIRone (BUSPAR) 5 MG tablet [[id number]][[id number]] She began taking makes her feel dizzy, nauseous, and she still has an elevated BP. Please follow up with patient.

## 2018-03-20 NOTE — Telephone Encounter (Signed)
I have sent a prescription for Prozac to her pharmacy and she can discontinue the BuSpar.  Her blood pressure was normal at her most recent office visit.  Encouraged to keep a blood pressure log and schedule an appointment with me.

## 2018-03-20 NOTE — Telephone Encounter (Signed)
Will route to PCP for review. 

## 2018-03-21 ENCOUNTER — Ambulatory Visit: Payer: 59 | Attending: Family Medicine | Admitting: Physician Assistant

## 2018-03-21 VITALS — BP 123/79 | HR 69 | Temp 98.5°F | Resp 18 | Ht 67.0 in | Wt 248.0 lb

## 2018-03-21 DIAGNOSIS — R030 Elevated blood-pressure reading, without diagnosis of hypertension: Secondary | ICD-10-CM

## 2018-03-21 DIAGNOSIS — F419 Anxiety disorder, unspecified: Secondary | ICD-10-CM

## 2018-03-21 DIAGNOSIS — Z79891 Long term (current) use of opiate analgesic: Secondary | ICD-10-CM | POA: Diagnosis not present

## 2018-03-21 DIAGNOSIS — Z79899 Other long term (current) drug therapy: Secondary | ICD-10-CM | POA: Diagnosis not present

## 2018-03-21 DIAGNOSIS — R519 Headache, unspecified: Secondary | ICD-10-CM

## 2018-03-21 DIAGNOSIS — R51 Headache: Secondary | ICD-10-CM

## 2018-03-21 NOTE — Patient Instructions (Addendum)
Check blood pressure at least 5 times weekly and record.  Bring to next visit.      Stress and Stress Management Stress is a normal reaction to life events. It is what you feel when life demands more than you are used to or more than you can handle. Some stress can be useful. For example, the stress reaction can help you catch the last bus of the day, study for a test, or meet a deadline at work. But stress that occurs too often or for too long can cause problems. It can affect your emotional health and interfere with relationships and normal daily activities. Too much stress can weaken your immune system and increase your risk for physical illness. If you already have a medical problem, stress can make it worse. What are the causes? All sorts of life events may cause stress. An event that causes stress for one person may not be stressful for another person. Major life events commonly cause stress. These may be positive or negative. Examples include losing your job, moving into a new home, getting married, having a baby, or losing a loved one. Less obvious life events may also cause stress, especially if they occur day after day or in combination. Examples include working long hours, driving in traffic, caring for children, being in debt, or being in a difficult relationship. What are the signs or symptoms? Stress may cause emotional symptoms including, the following:  Anxiety. This is feeling worried, afraid, on edge, overwhelmed, or out of control.  Anger. This is feeling irritated or impatient.  Depression. This is feeling sad, down, helpless, or guilty.  Difficulty focusing, remembering, or making decisions.  Stress may cause physical symptoms, including the following:  Aches and pains. These may affect your head, neck, back, stomach, or other areas of your body.  Tight muscles or clenched jaw.  Low energy or trouble sleeping.  Stress may cause unhealthy behaviors, including the  following:  Eating to feel better (overeating) or skipping meals.  Sleeping too little, too much, or both.  Working too much or putting off tasks (procrastination).  Smoking, drinking alcohol, or using drugs to feel better.  How is this diagnosed? Stress is diagnosed through an assessment by your health care provider. Your health care provider will ask questions about your symptoms and any stressful life events.Your health care provider will also ask about your medical history and may order blood tests or other tests. Certain medical conditions and medicine can cause physical symptoms similar to stress. Mental illness can cause emotional symptoms and unhealthy behaviors similar to stress. Your health care provider may refer you to a mental health professional for further evaluation. How is this treated? Stress management is the recommended treatment for stress.The goals of stress management are reducing stressful life events and coping with stress in healthy ways. Techniques for reducing stressful life events include the following:  Stress identification. Self-monitor for stress and identify what causes stress for you. These skills may help you to avoid some stressful events.  Time management. Set your priorities, keep a calendar of events, and learn to say "no." These tools can help you avoid making too many commitments.  Techniques for coping with stress include the following:  Rethinking the problem. Try to think realistically about stressful events rather than ignoring them or overreacting. Try to find the positives in a stressful situation rather than focusing on the negatives.  Exercise. Physical exercise can release both physical and emotional tension. The key is  to find a form of exercise you enjoy and do it regularly.  Relaxation techniques. These relax the body and mind. Examples include yoga, meditation, tai chi, biofeedback, deep breathing, progressive muscle relaxation,  listening to music, being out in nature, journaling, and other hobbies. Again, the key is to find one or more that you enjoy and can do regularly.  Healthy lifestyle. Eat a balanced diet, get plenty of sleep, and do not smoke. Avoid using alcohol or drugs to relax.  Strong support network. Spend time with family, friends, or other people you enjoy being around.Express your feelings and talk things over with someone you trust.  Counseling or talktherapy with a mental health professional may be helpful if you are having difficulty managing stress on your own. Medicine is typically not recommended for the treatment of stress.Talk to your health care provider if you think you need medicine for symptoms of stress. Follow these instructions at home:  Keep all follow-up visits as directed by your health care provider.  Take all medicines as directed by your health care provider. Contact a health care provider if:  Your symptoms get worse or you start having new symptoms.  You feel overwhelmed by your problems and can no longer manage them on your own. Get help right away if:  You feel like hurting yourself or someone else. This information is not intended to replace advice given to you by your health care provider. Make sure you discuss any questions you have with your health care provider. Document Released: 01/03/2001 Document Revised: 12/16/2015 Document Reviewed: 03/04/2013 Elsevier Interactive Patient Education  2017 [employer].

## 2018-03-21 NOTE — Progress Notes (Signed)
Patient ID: Monique Tyler, female   DOB: 13-Apr-1966, 51 y.o.   MRN: [id number]   Bambi Fehnel, is a 52 y.o. female  [id number]  [id number]  DOB - 03-24-1966  Subjective:  Chief Complaint and HPI: Monique Tyler is a 52 y.o. female here today for HA and elevated BP yesterday with nausea and not feeling well.  She was started on Buspar 6 days ago at 5mg  twice daily and wonders if this is the cause.  The HA was not unusual, but when a PA checked it at work it was in the 150s/90s.  No HA or nausea today.  Feels better, just feels tired and run down bc of stress at work.  Admits to a lot of stress at work and at home.  No vision changes/dizziness.  Has been referred to cardiologist for a stress test and for a sleep study.  Also of note, she has been making major changes to her diet.      ROS:   Constitutional:  No f/c, No night sweats, No unexplained weight loss. EENT:  No vision changes, No blurry vision, No hearing changes. No mouth, throat, or ear problems.  Respiratory: No cough, No SOB Cardiac: No CP, no palpitations GI:  No abd pain, No V/D/C.  + nausea GU: No Urinary s/sx Musculoskeletal: No joint pain Neuro: + headache, no dizziness, no motor weakness.  Skin: No rash Endocrine:  No polydipsia. No polyuria.  Psych: Denies SI/HI  No problems updated.  ALLERGIES: Allergies  Allergen Reactions  . Shellfish Allergy Swelling  . Pork-Derived Products Nausea And Vomiting    PAST MEDICAL HISTORY: Past Medical History:  Diagnosis Date  . GERD (gastroesophageal reflux disease)   . Hypertension    pt denies a hx of HTN  . Kidney stones   . Lung nodules   . Migraine   . Vertigo     MEDICATIONS AT HOME: Prior to Admission medications   Medication Sig Start Date End Date Taking? Authorizing Provider  albuterol (PROVENTIL HFA;VENTOLIN HFA) 108 (90 Base) MCG/ACT inhaler Inhale 2 puffs into the lungs every 4 (four) hours as needed for wheezing or shortness of breath. 02/14/18   Yes Sheana Bir M, PA-C  busPIRone (BUSPAR) 5 MG tablet Take 1 tablet (5 mg total) by mouth 2 (two) times daily. 03/15/18  Yes Hoy Register, MD  cetirizine (ZYRTEC) 10 MG tablet Take 1 tablet (10 mg total) by mouth daily. 02/28/17  Yes Hoy Register, MD  Cholecalciferol (VITAMIN D) 2000 units CAPS Take 2,000 Units by mouth daily.   Yes [provider]  diclofenac (VOLTAREN) 75 MG EC tablet Take 1 tablet (75 mg total) by mouth 2 (two) times daily. 02/14/18  Yes Georgian Co M, PA-C  FLUoxetine (PROZAC) 20 MG tablet Take 1 tablet (20 mg total) by mouth daily. 03/20/18  Yes Newlin, Odette Horns, MD  fluticasone (FLONASE) 50 MCG/ACT nasal spray Place 2 sprays into both nostrils daily. 02/14/18  Yes Anders Simmonds, PA-C  gabapentin (NEURONTIN) 300 MG capsule Take 1 capsule (300 mg total) by mouth 2 (two) times daily. 02/14/18  Yes Anders Simmonds, PA-C  hydrOXYzine (ATARAX/VISTARIL) 25 MG tablet Take 1 tablet (25 mg total) by mouth 3 (three) times daily as needed. 02/14/18  Yes Nilesh Stegall, Marzella Schlein, PA-C  methocarbamol (ROBAXIN) 500 MG tablet Take 1 tablet (500 mg total) by mouth every 8 (eight) hours as needed for muscle spasms. 02/14/18  Yes Anders Simmonds, PA-C  ondansetron (ZOFRAN ODT) 4 MG disintegrating tablet  Take 1 tablet (4 mg total) by mouth every 8 (eight) hours as needed for nausea or vomiting. 02/14/18  Yes Warden Buffa M, PA-C  topiramate (TOPAMAX) 100 MG tablet 200 mg in am 100 mg in pm Patient taking differently: Take 100-200 mg by mouth See admin instructions. 200 mg in am 100 mg in pm 02/14/18  Yes Darwin Rothlisberger M, PA-C  traMADol (ULTRAM) 50 MG tablet Take 1 tablet (50 mg total) by mouth every 6 (six) hours as needed. 02/20/18  Yes Bethann BerkshireZammit, Joseph, MD     Objective:  EXAM:   Vitals:   03/21/18 0917  BP: 123/79  Pulse: 69  Resp: 18  Temp: 98.5 F (36.9 C)  TempSrc: Oral  SpO2: 99%  Weight: 248 lb (112.5 kg)  Height: 5\' 7"  (1.702 m)    General appearance :  A&OX3. NAD. Non-toxic-appearing HEENT: Atraumatic and Normocephalic.  PERRLA. EOM intact.  Neck: supple, no JVD. No cervical lymphadenopathy. No thyromegaly Chest/Lungs:  Breathing-non-labored, Good air entry bilaterally, breath sounds normal without rales, rhonchi, or wheezing  CVS: S1 S2 regular, no murmurs, gallops, rubs  Extremities: Bilateral Lower Ext shows no edema, both legs are warm to touch with = pulse throughout Neurology:  CN II-XII grossly intact, Non focal.   Psych:  TP linear. J/I WNL. Normal speech. Appropriate eye contact and affect.  Skin:  No Rash  Data Review Lab Results  Component Value Date   HGBA1C  12/09/2007    4.6 (NOTE)   The ADA recommends the following therapeutic goals for glycemic   control related to Hgb A1C measurement:   Goal of Therapy:   < 7.0% Hgb A1C   Action Suggested:  > 8.0% Hgb A1C   Ref:  Diabetes Care, 22, Suppl. 1, 1999     Assessment & Plan   1. Elevated BP without diagnosis of hypertension Check blood pressure at least 5 times weekly and record.  Bring to next visit.  2. Acute nonintractable headache, unspecified headache type Resolved today  3. Anxiety I doubt these symptoms are related to buspar as she was started on a very low dose.  She can try halfing the dose for the next week.  The symptoms she had are more likely related to stress without much self-care and drastic dietary changes.  HA warnings discussed.       Patient have been counseled extensively about nutrition and exercise  Return in about 3 weeks (around 04/11/2018) for Dr Alvis LemmingsNewlin; recheck BP and anxiety.  The patient was given clear instructions to go to ER or return to medical center if symptoms don't improve, worsen or new problems develop. The patient verbalized understanding. The patient was told to call to get lab results if they haven't heard anything in the next week.     Georgian CoAngela Macon Lesesne, PA-C [facility] [place],  [place] [phone]   03/21/2018, 9:30 AM

## 2018-03-21 NOTE — Telephone Encounter (Signed)
Patient was called and informed of medication change and to record BP and bring to next OV.

## 2018-04-02 ENCOUNTER — Telehealth: Payer: Self-pay | Admitting: Family Medicine

## 2018-04-02 DIAGNOSIS — R29818 Other symptoms and signs involving the nervous system: Secondary | ICD-10-CM

## 2018-04-02 NOTE — Telephone Encounter (Signed)
Monique Tyler called from the sleep disorder center called regarding the patient's order for a sleep study. Patients insurance denied the order claim however, they stated she could get a home sleep test done. Please follow up with Monique Tyler.

## 2018-04-03 NOTE — Telephone Encounter (Signed)
Order changed.

## 2018-04-03 NOTE — Telephone Encounter (Signed)
Order number for home sleep test in [id number].

## 2018-04-18 ENCOUNTER — Other Ambulatory Visit: Payer: Self-pay

## 2018-04-18 ENCOUNTER — Ambulatory Visit: Payer: Self-pay | Admitting: Family Medicine

## 2018-04-18 ENCOUNTER — Ambulatory Visit: Payer: 59 | Attending: Family Medicine | Admitting: Family Medicine

## 2018-04-18 VITALS — BP 116/80 | HR 59 | Temp 98.8°F | Resp 17 | Ht 67.0 in | Wt 245.6 lb

## 2018-04-18 DIAGNOSIS — Z79899 Other long term (current) drug therapy: Secondary | ICD-10-CM | POA: Diagnosis not present

## 2018-04-18 DIAGNOSIS — R031 Nonspecific low blood-pressure reading: Secondary | ICD-10-CM | POA: Diagnosis not present

## 2018-04-18 DIAGNOSIS — F329 Major depressive disorder, single episode, unspecified: Secondary | ICD-10-CM | POA: Insufficient documentation

## 2018-04-18 DIAGNOSIS — I10 Essential (primary) hypertension: Secondary | ICD-10-CM | POA: Diagnosis present

## 2018-04-18 DIAGNOSIS — Z7951 Long term (current) use of inhaled steroids: Secondary | ICD-10-CM | POA: Diagnosis not present

## 2018-04-18 DIAGNOSIS — Z91018 Allergy to other foods: Secondary | ICD-10-CM | POA: Insufficient documentation

## 2018-04-18 DIAGNOSIS — Z8249 Family history of ischemic heart disease and other diseases of the circulatory system: Secondary | ICD-10-CM | POA: Diagnosis not present

## 2018-04-18 DIAGNOSIS — F419 Anxiety disorder, unspecified: Secondary | ICD-10-CM | POA: Diagnosis not present

## 2018-04-18 DIAGNOSIS — R079 Chest pain, unspecified: Secondary | ICD-10-CM | POA: Diagnosis not present

## 2018-04-18 DIAGNOSIS — Z91013 Allergy to seafood: Secondary | ICD-10-CM | POA: Insufficient documentation

## 2018-04-18 DIAGNOSIS — H8110 Benign paroxysmal vertigo, unspecified ear: Secondary | ICD-10-CM | POA: Insufficient documentation

## 2018-04-18 DIAGNOSIS — Z131 Encounter for screening for diabetes mellitus: Secondary | ICD-10-CM | POA: Diagnosis not present

## 2018-04-18 DIAGNOSIS — Z87891 Personal history of nicotine dependence: Secondary | ICD-10-CM | POA: Diagnosis not present

## 2018-04-18 DIAGNOSIS — Z1331 Encounter for screening for depression: Secondary | ICD-10-CM

## 2018-04-18 DIAGNOSIS — E559 Vitamin D deficiency, unspecified: Secondary | ICD-10-CM | POA: Insufficient documentation

## 2018-04-18 LAB — POCT GLYCOSYLATED HEMOGLOBIN (HGB A1C): Hemoglobin A1C: 5.1 % (ref 4.0–5.6)

## 2018-04-18 MED ORDER — HYDROXYZINE HCL 25 MG PO TABS: 25.0000 mg | ORAL_TABLET | Freq: Three times a day (TID) | ORAL | 3 refills | Status: DC | PRN

## 2018-04-18 NOTE — Progress Notes (Signed)
Monique Tyler, is a 52 y.o. female  [id number]  [id number]  DOB - 1965/08/08  CC:  Chief Complaint  Patient presents with  . Hypertension    in for BP f/up. doesn't check BP at home. watches salt intake & exercises. has c/o SHOB, chest discomfort, lower extremity swelling. was referred to Cardiology but hasn't been scheduled. sleep study is scheduled for 04/23/18.       HPI: Monique Tyler is a 52 y.o. female is here today for blood pressure  follow-up. [name] has suffered from atypical chest pain and was referred by her PCP to cardiology for possible stress test. She has not been contacted to schedule an appointment. She continues to complain of chest pain at rest and with exertion. Last episode of chest pain last night. Episodes can last up to 1 minutes. She has recently experience some radiation of sharp pain to the lower chest. During her last office visit, BP was elevated which she associated more with chronic migraines and chronic stress. No routine home monitoring of blood pressure. Only significant cardiovascular family history is both children suffer from hypertension and are treated with medication. Adheres to a low salt diet and makes efforts to exercise.  Patient denies abdominal pain, nausea, new weakness , numbness or tingling, edema, or worrisome cough.   Chronic health problems include:has Avitaminosis D; Migraine; Benign paroxysmal positional vertigo; Status post right rotator cuff repair; Seasonal allergies; and Anxiety on their problem list.   Current medications: Current Outpatient Medications:  .  albuterol (PROVENTIL HFA;VENTOLIN HFA) 108 (90 Base) MCG/ACT inhaler, Inhale 2 puffs into the lungs every 4 (four) hours as needed for wheezing or shortness of breath., Disp: 1 Inhaler, Rfl: 1 .  cetirizine (ZYRTEC) 10 MG tablet, Take 1 tablet (10 mg total) by mouth daily., Disp: 30 tablet, Rfl: 3 .  Cholecalciferol (VITAMIN D) 2000 units CAPS, Take 2,000 Units by mouth daily., Disp:  , Rfl:  .  diclofenac (VOLTAREN) 75 MG EC tablet, Take 1 tablet (75 mg total) by mouth 2 (two) times daily., Disp: 60 tablet, Rfl: 2 .  FLUoxetine (PROZAC) 20 MG capsule, Take 1 capsule by mouth daily., Disp: , Rfl:  .  fluticasone (FLONASE) 50 MCG/ACT nasal spray, Place 2 sprays into both nostrils daily., Disp: 16 g, Rfl: 6 .  gabapentin (NEURONTIN) 300 MG capsule, Take 1 capsule (300 mg total) by mouth 2 (two) times daily., Disp: 60 capsule, Rfl: 3 .  hydrOXYzine (ATARAX/VISTARIL) 25 MG tablet, Take 1 tablet (25 mg total) by mouth 3 (three) times daily as needed., Disp: 90 tablet, Rfl: 3 .  methocarbamol (ROBAXIN) 500 MG tablet, Take 1 tablet (500 mg total) by mouth every 8 (eight) hours as needed for muscle spasms., Disp: 60 tablet, Rfl: 2 .  ondansetron (ZOFRAN ODT) 4 MG disintegrating tablet, Take 1 tablet (4 mg total) by mouth every 8 (eight) hours as needed for nausea or vomiting., Disp: 20 tablet, Rfl: 0 .  topiramate (TOPAMAX) 100 MG tablet, 200 mg in am 100 mg in pm (Patient taking differently: Take 100-200 mg by mouth See admin instructions. 200 mg in am 100 mg in pm), Disp: 45 tablet, Rfl: 6 .  traMADol (ULTRAM) 50 MG tablet, Take 1 tablet (50 mg total) by mouth every 6 (six) hours as needed., Disp: 15 tablet, Rfl: 0   Pertinent family medical history:  Family History  Problem Relation Age of Onset  . Hypertension Daughter   . Hypertension Son       Allergies  Allergen Reactions  . Shellfish Allergy Swelling  . Pork-Derived Products Nausea And Vomiting    Social History   Socioeconomic History  . Marital status: Married    Spouse name: Not on file  . Number of children: Not on file  . Years of education: Not on file  . Highest education level: Not on file  Occupational History  . Not on file  Social Needs  . Financial resource strain: Not on file  . Food insecurity:    Worry: Not on file    Inability: Not on file  . Transportation needs:    Medical: Not on file     Non-medical: Not on file  Tobacco Use  . Smoking status: Former Smoker    Types: Cigarettes  . Smokeless tobacco: Never Used  Substance and Sexual Activity  . Alcohol use: No    Alcohol/week: 0.0 standard drinks  . Drug use: No  . Sexual activity: Yes    Birth control/protection: Surgical  Lifestyle  . Physical activity:    Days per week: Not on file    Minutes per session: Not on file  . Stress: Not on file  Relationships  . Social connections:    Talks on phone: Not on file    Gets together: Not on file    Attends religious service: Not on file    Active member of club or organization: Not on file    Attends meetings of clubs or organizations: Not on file    Relationship status: Not on file  . Intimate partner violence:    Fear of current or ex partner: Not on file    Emotionally abused: Not on file    Physically abused: Not on file    Forced sexual activity: Not on file  Other Topics Concern  . Not on file  Social History Narrative  . Not on file    Review of Systems: Constitutional: Negative for fever, chills, diaphoresis, activity change, appetite change and fatigue. HENT: Negative for ear pain, nosebleeds, congestion, facial swelling, rhinorrhea, neck pain, neck stiffness and ear discharge.  Eyes: Negative for pain, discharge, redness, itching and visual disturbance. Respiratory: Negative for cough, choking, chest tightness,positive shortness of breath, wheezing and stridor.  Cardiovascular: Negative for chest pain, palpitations and leg swelling. Gastrointestinal: Negative for abdominal distention. lMusculoskeletal: Negative for back pain, joint swelling, arthralgia and gait problem. Neurological: Negative for dizziness, tremors, seizures, syncope, facial asymmetry, speech difficulty, weakness, light-headedness, numbness and headaches.  Hematological: Negative for adenopathy. Does not bruise/bleed easily. Psychiatric/Behavioral: Negative for hallucinations,  behavioral problems, positive insomnia, positive anxiety   Objective:   Vitals:   04/18/18 0930  BP: 116/80  Pulse: (!) 59  Resp: 17  Temp: 98.8 F (37.1 C)    Physical Exam: Constitutional: Patient appears well-developed and well-nourished. No distress. HENT: Normocephalic, atraumatic, External right and left ear normal. Oropharynx is clear and moist.  Eyes: Conjunctivae and EOM are normal. PERRLA, no scleral icterus. Neck: Normal ROM. Neck supple. No JVD. No tracheal deviation. No thyromegaly. CVS: RRR, S1/S2 +, no murmurs, no gallops, no carotid bruit.  Pulmonary: Effort and breath sounds normal, no stridor, rhonchi, wheezes, rales.  Abdominal: Soft. BS +, no distension, tenderness, rebound or guarding.  Musculoskeletal: Normal range of motion. No edema and no tenderness.  Lymphadenopathy: No lymphadenopathy noted, cervical, inguinal or axillary Neuro: Alert. Normal reflexes, muscle tone coordination. No cranial nerve deficit. Skin: Skin is warm and dry. No rash noted. Not diaphoretic. No erythema. No pallor.  Psychiatric: Normal mood and affect. Behavior, judgment, thought content normal.  Lab Results  Component Value Date   WBC 5.6 02/20/2018   HGB 12.9 02/20/2018   HCT 42.8 02/20/2018   MCV 87.2 02/20/2018   PLT 273 02/20/2018   Lab Results  Component Value Date   CREATININE 0.97 02/20/2018   BUN 12 02/20/2018   NA 141 02/20/2018   K 3.9 02/20/2018   CL 110 02/20/2018   CO2 23 02/20/2018    Lab Results  Component Value Date   HGBA1C  12/09/2007    4.6 (NOTE)   The ADA recommends the following therapeutic goals for glycemic   control related to Hgb A1C measurement:   Goal of Therapy:   < 7.0% Hgb A1C   Action Suggested:  > 8.0% Hgb A1C   Ref:  Diabetes Care, 22, Suppl. 1, 1999    Lipid Panel     Component Value Date/Time   CHOL 206 (H) 11/16/2017 0844   TRIG 101 11/16/2017 0844   HDL 39 (L) 11/16/2017 0844   CHOLHDL 5.3 (H) 11/16/2017 0844   CHOLHDL 4.1  05/03/2011 0519   VLDL 16 05/03/2011 0519   LDLCALC 147 (H) 11/16/2017 0844        Assessment and plan:  1. Positive depression screening, suffers from chronic depression. Currently feels that anti-depressant is effective at current dose.  No current suicidal or homicidal ideations. Increased vistaril 50 mg at bedtime to facilitate sleep which will hopefully improve mood.   2. Anxiety, stable - hydrOXYzine (ATARAX/VISTARIL) 25 MG tablet; Take 1 tablet (25 mg total) by mouth 3 (three) times daily as needed (Increase night time dose 50 mg).    3. Chest pain, unspecified type, no pertinent changes in EKG. Rate 57, Sinus Bradycardia. No ischemic changes.Cardiology appointment scheduled for 04/24/2018 at [facility]. She is also scheduled for sleep study 04/23/18  4. Blood pressure lower than prior measurement,  Blood pressure is stable today. Continue lifestyle changes to maintain good blood pressure. 5. Encounter for screening for diabetes mellitus, A1C 5.1, normal.  Keep follow-up with Dr. Alvis Lemmings  Meds ordered this encounter  Medications  . hydrOXYzine (ATARAX/VISTARIL) 25 MG tablet    Sig: Take 1 tablet (25 mg total) by mouth 3 (three) times daily as needed (Increase night time dose 50 mg).    Dispense:  120 tablet    Refill:  3    Orders Placed This Encounter  Procedures  . POCT glycosylated hemoglobin (Hb A1C)  . EKG 12-Lead     The patient was given clear instructions to go to ER or return to medical center if symptoms don't improve, worsen or new problems develop. The patient verbalized understanding. The patient was told to call to get lab results if they haven't heard anything in the next week.    Godfrey Pick. Tiburcio Pea, MSN, [facility]  [address] [place], [place] [address] [phone]   This note has been created with Dragon speech recognition software and smart phrase technology. Any transcriptional errors are unintentional.

## 2018-04-18 NOTE — Patient Instructions (Addendum)
You have been scheduled for an appointment with cardiology. Arrive at appointment at least 15 minutes in advance and you should be fasting as they will likely recheck your cholesterol.  If chest pain increases in intensity or your experience worsening of shortness of breath, go immediately to the ER for further evaluation.   Blood pressure looks great today. Continue to reduce sodium intake and increase physical activity.    I have included materials of coping with stress. If you ever desire a referral for formal counseling, please feel free to let us know here and we will initiate the referral process. I have increase your Vistaril to 25 mg twice daily as needed and 50 mg at bedtime to help facilitate relaxation and hopefully help you achieve rest.   Nonspecific Chest Pain Chest pain can be caused by many different conditions. There is a chance that your pain could be related to something serious, such as a heart attack or a blood clot in your lungs. Chest pain can also be caused by conditions that are not life-threatening. If you have chest pain, it is very important to follow up with your doctor. Follow these instructions at home: Medicines  If you were prescribed an antibiotic medicine, take it as told by your doctor. Do not stop taking the antibiotic even if you start to feel better.  Take over-the-counter and prescription medicines only as told by your doctor. Lifestyle  Do not use any products that contain nicotine or tobacco, such as cigarettes and e-cigarettes. If you need help quitting, ask your doctor.  Do not drink alcohol.  Make lifestyle changes as told by your doctor. These may include: ? Getting regular exercise. Ask your doctor for some activities that are safe for you. ? Eating a heart-healthy diet. A diet specialist (dietitian) can help you to learn healthy eating options. ? Staying at a healthy weight. ? Managing diabetes, if needed. ? Lowering your stress, as with  deep breathing or spending time in nature. General instructions  Avoid any activities that make you feel chest pain.  If your chest pain is because of heartburn: ? Raise (elevate) the head of your bed about 6 inches (15 cm). You can do this by putting blocks under the bed legs at the head of the bed. ? Do not sleep with extra pillows under your head. That does not help heartburn.  Keep all follow-up visits as told by your doctor. This is important. This includes any further testing if your chest pain does not go away. Contact a doctor if:  Your chest pain does not go away.  You have a rash with blisters on your chest.  You have a fever.  You have chills. Get help right away if:  Your chest pain is worse.  You have a cough that gets worse, or you cough up blood.  You have very bad (severe) pain in your belly (abdomen).  You are very weak.  You pass out (faint).  You have either of these for no clear reason: ? Sudden chest discomfort. ? Sudden discomfort in your arms, back, neck, or jaw.  You have shortness of breath at any time.  You suddenly start to sweat, or your skin gets clammy.  You feel sick to your stomach (nauseous).  You throw up (vomit).  You suddenly feel light-headed or dizzy.  Your heart starts to beat fast, or it feels like it is skipping beats. These symptoms may be an emergency. Do not wait to see  if the symptoms will go away. Get medical help right away. Call your local emergency services (911 in the [place] Do not drive yourself to the hospital. This information is not intended to replace advice given to you by your health care provider. Make sure you discuss any questions you have with your health care provider. Document Released: 12/27/2007 Document Revised: 04/03/2016 Document Reviewed: 04/03/2016 Elsevier Interactive Patient Education  2017 [place] and Stress Management Stress is a normal reaction to life events. It is what you  feel when life demands more than you are used to or more than you can handle. Some stress can be useful. For example, the stress reaction can help you catch the last bus of the day, study for a test, or meet a deadline at work. But stress that occurs too often or for too long can cause problems. It can affect your emotional health and interfere with relationships and normal daily activities. Too much stress can weaken your immune system and increase your risk for physical illness. If you already have a medical problem, stress can make it worse. What are the causes? All sorts of life events may cause stress. An event that causes stress for one person may not be stressful for another person. Major life events commonly cause stress. These may be positive or negative. Examples include losing your job, moving into a new home, getting married, having a baby, or losing a loved one. Less obvious life events may also cause stress, especially if they occur day after day or in combination. Examples include working long hours, driving in traffic, caring for children, being in debt, or being in a difficult relationship. What are the signs or symptoms? Stress may cause emotional symptoms including, the following:  Anxiety. This is feeling worried, afraid, on edge, overwhelmed, or out of control.  Anger. This is feeling irritated or impatient.  Depression. This is feeling sad, down, helpless, or guilty.  Difficulty focusing, remembering, or making decisions.  Stress may cause physical symptoms, including the following:  Aches and pains. These may affect your head, neck, back, stomach, or other areas of your body.  Tight muscles or clenched jaw.  Low energy or trouble sleeping.  Stress may cause unhealthy behaviors, including the following:  Eating to feel better (overeating) or skipping meals.  Sleeping too little, too much, or both.  Working too much or putting off tasks (procrastination).  Smoking,  drinking alcohol, or using drugs to feel better.  How is this diagnosed? Stress is diagnosed through an assessment by your health care provider. Your health care provider will ask questions about your symptoms and any stressful life events.Your health care provider will also ask about your medical history and may order blood tests or other tests. Certain medical conditions and medicine can cause physical symptoms similar to stress. Mental illness can cause emotional symptoms and unhealthy behaviors similar to stress. Your health care provider may refer you to a mental health professional for further evaluation. How is this treated? Stress management is the recommended treatment for stress.The goals of stress management are reducing stressful life events and coping with stress in healthy ways. Techniques for reducing stressful life events include the following:  Stress identification. Self-monitor for stress and identify what causes stress for you. These skills may help you to avoid some stressful events.  Time management. Set your priorities, keep a calendar of events, and learn to say "no." These tools can help you avoid making too many commitments.  Techniques for coping with stress include the following:  Rethinking the problem. Try to think realistically about stressful events rather than ignoring them or overreacting. Try to find the positives in a stressful situation rather than focusing on the negatives.  Exercise. Physical exercise can release both physical and emotional tension. The key is to find a form of exercise you enjoy and do it regularly.  Relaxation techniques. These relax the body and mind. Examples include yoga, meditation, tai chi, biofeedback, deep breathing, progressive muscle relaxation, listening to music, being out in nature, journaling, and other hobbies. Again, the key is to find one or more that you enjoy and can do regularly.  Healthy lifestyle. Eat a balanced  diet, get plenty of sleep, and do not smoke. Avoid using alcohol or drugs to relax.  Strong support network. Spend time with family, friends, or other people you enjoy being around.Express your feelings and talk things over with someone you trust.  Counseling or talktherapy with a mental health professional may be helpful if you are having difficulty managing stress on your own. Medicine is typically not recommended for the treatment of stress.Talk to your health care provider if you think you need medicine for symptoms of stress. Follow these instructions at home:  Keep all follow-up visits as directed by your health care provider.  Take all medicines as directed by your health care provider. Contact a health care provider if:  Your symptoms get worse or you start having new symptoms.  You feel overwhelmed by your problems and can no longer manage them on your own. Get help right away if:  You feel like hurting yourself or someone else. This information is not intended to replace advice given to you by your health care provider. Make sure you discuss any questions you have with your health care provider. Document Released: 01/03/2001 Document Revised: 12/16/2015 Document Reviewed: 03/04/2013 Elsevier Interactive Patient Education  2017 [place] After being diagnosed with an anxiety disorder, you may be relieved to know why you have felt or behaved a certain way. It is natural to also feel overwhelmed about the treatment ahead and what it will mean for your life. With care and support, you can manage this condition and recover from it. How to cope with anxiety Dealing with stress Stress is your body's reaction to life changes and events, both good and bad. Stress can last just a few hours or it can be ongoing. Stress can play a major role in anxiety, so it is important to learn both how to cope with stress and how to think about it differently. Talk with  your health care provider or a counselor to learn more about stress reduction. He or she may suggest some stress reduction techniques, such as:  Music therapy. This can include creating or listening to music that you enjoy and that inspires you.  Mindfulness-based meditation. This involves being aware of your normal breaths, rather than trying to control your breathing. It can be done while sitting or walking.  Centering prayer. This is a kind of meditation that involves focusing on a word, phrase, or sacred image that is meaningful to you and that brings you peace.  Deep breathing. To do this, expand your stomach and inhale slowly through your nose. Hold your breath for 3-5 seconds. Then exhale slowly, allowing your stomach muscles to relax.  Self-talk. This is a skill where you identify thought patterns that lead to anxiety reactions and correct  those thoughts.  Muscle relaxation. This involves tensing muscles then relaxing them.  Choose a stress reduction technique that fits your lifestyle and personality. Stress reduction techniques take time and practice. Set aside 5-15 minutes a day to do them. Therapists can offer training in these techniques. The training may be covered by some insurance plans. Other things you can do to manage stress include:  Keeping a stress diary. This can help you learn what triggers your stress and ways to control your response.  Thinking about how you respond to certain situations. You may not be able to control everything, but you can control your reaction.  Making time for activities that help you relax, and not feeling guilty about spending your time in this way.  Therapy combined with coping and stress-reduction skills provides the best chance for successful treatment. Medicines Medicines can help ease symptoms. Medicines for anxiety include:  Anti-anxiety drugs.  Antidepressants.  Beta-blockers.  Medicines may be used as the main treatment for  anxiety disorder, along with therapy, or if other treatments are not working. Medicines should be prescribed by a health care provider. Relationships Relationships can play a big part in helping you recover. Try to spend more time connecting with trusted friends and family members. Consider going to couples counseling, taking family education classes, or going to family therapy. Therapy can help you and others better understand the condition. How to recognize changes in your condition Everyone has a different response to treatment for anxiety. Recovery from anxiety happens when symptoms decrease and stop interfering with your daily activities at home or work. This may mean that you will start to:  Have better concentration and focus.  Sleep better.  Be less irritable.  Have more energy.  Have improved memory.  It is important to recognize when your condition is getting worse. Contact your health care provider if your symptoms interfere with home or work and you do not feel like your condition is improving. Where to find help and support: You can get help and support from these sources:  Self-help groups.  Online and [employer].  A trusted spiritual leader.  Couples counseling.  Family education classes.  Family therapy.  Follow these instructions at home:  Eat a healthy diet that includes plenty of vegetables, fruits, whole grains, low-fat dairy products, and lean protein. Do not eat a lot of foods that are high in solid fats, added sugars, or salt.  Exercise. Most adults should do the following: ? Exercise for at least 150 minutes each week. The exercise should increase your heart rate and make you sweat (moderate-intensity exercise). ? Strengthening exercises at least twice a week.  Cut down on caffeine, tobacco, alcohol, and other potentially harmful substances.  Get the right amount and quality of sleep. Most adults need 7-9 hours of sleep each  night.  Make choices that simplify your life.  Take over-the-counter and prescription medicines only as told by your health care provider.  Avoid caffeine, alcohol, and certain over-the-counter cold medicines. These may make you feel worse. Ask your pharmacist which medicines to avoid.  Keep all follow-up visits as told by your health care provider. This is important. Questions to ask your health care provider  Would I benefit from therapy?  How often should I follow up with a health care provider?  How long do I need to take medicine?  Are there any long-term side effects of my medicine?  Are there any alternatives to taking medicine? Contact a health care  provider if:  You have a hard time staying focused or finishing daily tasks.  You spend many hours a day feeling worried about everyday life.  You become exhausted by worry.  You start to have headaches, feel tense, or have nausea.  You urinate more than normal.  You have diarrhea. Get help right away if:  You have a racing heart and shortness of breath.  You have thoughts of hurting yourself or others. If you ever feel like you may hurt yourself or others, or have thoughts about taking your own life, get help right away. You can go to your nearest emergency department or call:  Your local emergency services (911 in the [place]  A suicide crisis helpline, such as the [place] at (925) 286-2486. This is open 24-hours a day.  Summary  Taking steps to deal with stress can help calm you.  Medicines cannot cure anxiety disorders, but they can help ease symptoms.  Family, friends, and partners can play a big part in helping you recover from an anxiety disorder. This information is not intended to replace advice given to you by your health care provider. Make sure you discuss any questions you have with your health care provider. Document Released: 07/04/2016 Document Revised: 07/04/2016  Document Reviewed: 07/04/2016 Elsevier Interactive Patient Education  [employer].

## 2018-04-19 NOTE — Progress Notes (Signed)
Referring-Enobong Newlin MD Reason for referral-chest pain  HPI: 52 yo female for evaluation of CP at request of Hoy Register MD. Echo 7/15 showed normal LV function. Seen in ER 7/31 with CP. CTA 7/19 showed no pulmonary embolus. Troponins normal and Hgb 12.9.  Over the past 3 months patient has had occasional chest pain.  It is in the substernal area radiating to the left breast.  It lasts 30 seconds and resolve spontaneously.  It is described as a pressure/sharp pain.  There is no associated symptoms.  It is not clearly exertional.  She has some dyspnea on exertion as well.  No orthopnea, PND, pedal edema, palpitations or syncope.  Because of the above cardiology asked to evaluate.  Current Outpatient Medications  Medication Sig Dispense Refill  . albuterol (PROVENTIL HFA;VENTOLIN HFA) 108 (90 Base) MCG/ACT inhaler Inhale 2 puffs into the lungs every 4 (four) hours as needed for wheezing or shortness of breath. 1 Inhaler 1  . cetirizine (ZYRTEC) 10 MG tablet Take 1 tablet (10 mg total) by mouth daily. 30 tablet 3  . Cholecalciferol (VITAMIN D) 2000 units CAPS Take 2,000 Units by mouth daily.    . diclofenac (VOLTAREN) 75 MG EC tablet Take 1 tablet (75 mg total) by mouth 2 (two) times daily. 60 tablet 2  . FLUoxetine (PROZAC) 20 MG capsule Take 1 capsule by mouth daily.    . fluticasone (FLONASE) 50 MCG/ACT nasal spray Place 2 sprays into both nostrils daily. 16 g 6  . gabapentin (NEURONTIN) 300 MG capsule Take 1 capsule (300 mg total) by mouth 2 (two) times daily. 60 capsule 3  . hydrOXYzine (ATARAX/VISTARIL) 25 MG tablet Take 1 tablet (25 mg total) by mouth 3 (three) times daily as needed (Increase night time dose 50 mg). 120 tablet 3  . methocarbamol (ROBAXIN) 500 MG tablet Take 1 tablet (500 mg total) by mouth every 8 (eight) hours as needed for muscle spasms. 60 tablet 2  . ondansetron (ZOFRAN ODT) 4 MG disintegrating tablet Take 1 tablet (4 mg total) by mouth every 8 (eight) hours as  needed for nausea or vomiting. 20 tablet 0  . topiramate (TOPAMAX) 100 MG tablet 200 mg in am 100 mg in pm (Patient taking differently: Take 100-200 mg by mouth See admin instructions. 200 mg in am 100 mg in pm) 45 tablet 6  . traMADol (ULTRAM) 50 MG tablet Take 1 tablet (50 mg total) by mouth every 6 (six) hours as needed. 15 tablet 0   No current facility-administered medications for this visit.     Allergies  Allergen Reactions  . Shellfish Allergy Swelling  . Pork-Derived Products Nausea And Vomiting     Past Medical History:  Diagnosis Date  . GERD (gastroesophageal reflux disease)   . Hyperlipidemia   . Hypertension    pt denies a hx of HTN  . Kidney stones   . Lung nodules   . Migraine   . Vertigo     Past Surgical History:  Procedure Laterality Date  . CESAREAN SECTION    . MANDIBLE SURGERY    . TUBAL LIGATION      Social History   Socioeconomic History  . Marital status: Married    Spouse name: Not on file  . Number of children: 7  . Years of education: Not on file  . Highest education level: Not on file  Occupational History  . Not on file  Social Needs  . Financial resource strain: Not on file  .  Food insecurity:    Worry: Not on file    Inability: Not on file  . Transportation needs:    Medical: Not on file    Non-medical: Not on file  Tobacco Use  . Smoking status: Former Smoker    Types: Cigarettes  . Smokeless tobacco: Never Used  Substance and Sexual Activity  . Alcohol use: No    Alcohol/week: 0.0 standard drinks  . Drug use: No  . Sexual activity: Yes    Birth control/protection: Surgical  Lifestyle  . Physical activity:    Days per week: Not on file    Minutes per session: Not on file  . Stress: Not on file  Relationships  . Social connections:    Talks on phone: Not on file    Gets together: Not on file    Attends religious service: Not on file    Active member of club or organization: Not on file    Attends meetings of clubs  or organizations: Not on file    Relationship status: Not on file  . Intimate partner violence:    Fear of current or ex partner: Not on file    Emotionally abused: Not on file    Physically abused: Not on file    Forced sexual activity: Not on file  Other Topics Concern  . Not on file  Social History Narrative  . Not on file    Family History  Problem Relation Age of Onset  . Hypertension Daughter   . Hypertension Son     ROS: no fevers or chills, productive cough, hemoptysis, dysphasia, odynophagia, melena, hematochezia, dysuria, hematuria, rash, seizure activity, orthopnea, PND, pedal edema, claudication. Remaining systems are negative.  Physical Exam:   Blood pressure 112/88, pulse 61, height 5\' 7"  (1.702 m), weight 247 lb (112 kg), last menstrual period 01/18/2012.  General:  Well developed/obese in NAD Skin warm/dry Patient not depressed No peripheral clubbing Back-normal HEENT-normal/normal eyelids Neck supple/normal carotid upstroke bilaterally; no bruits; no JVD; no thyromegaly chest - CTA/ normal expansion CV - RRR/normal S1 and S2; no murmurs, rubs or gallops;  PMI nondisplaced Abdomen -NT/ND, no HSM, no mass, + bowel sounds, no bruit 2+ femoral pulses, no bruits Ext-no edema, chords, 2+ DP Neuro-grossly nonfocal  ECG - 02/20/18 Sinus with no ST changes; personally reviewed  04/18/18 Sinus with on ST changes; personally reviewed.  A/P  1 chest pain-symptoms are atypical.  Previous electrocardiogram negative.  Troponin normal.  Cardiac CTA showed no pulmonary embolus.  I will arrange an exercise treadmill for risk stratification.  2 dyspnea-we will arrange an echocardiogram to further assess.  3 obesity-we discussed the importance of weight loss, diet and exercise.  4 hyperlipidemia-follow-up primary care.  Olga Millers, MD

## 2018-04-23 ENCOUNTER — Encounter: Payer: Self-pay | Admitting: Cardiology

## 2018-04-23 ENCOUNTER — Ambulatory Visit (HOSPITAL_BASED_OUTPATIENT_CLINIC_OR_DEPARTMENT_OTHER): Payer: 59 | Attending: Family Medicine | Admitting: Internal Medicine

## 2018-04-23 VITALS — Ht 67.0 in | Wt 245.0 lb

## 2018-04-23 DIAGNOSIS — R29818 Other symptoms and signs involving the nervous system: Secondary | ICD-10-CM | POA: Diagnosis present

## 2018-04-23 DIAGNOSIS — G4733 Obstructive sleep apnea (adult) (pediatric): Secondary | ICD-10-CM | POA: Insufficient documentation

## 2018-04-24 ENCOUNTER — Ambulatory Visit: Payer: 59 | Admitting: Cardiology

## 2018-04-24 ENCOUNTER — Encounter: Payer: Self-pay | Admitting: *Deleted

## 2018-04-24 ENCOUNTER — Encounter: Payer: Self-pay | Admitting: Cardiology

## 2018-04-24 VITALS — BP 112/88 | HR 61 | Ht 67.0 in | Wt 247.0 lb

## 2018-04-24 DIAGNOSIS — R0600 Dyspnea, unspecified: Secondary | ICD-10-CM | POA: Diagnosis not present

## 2018-04-24 DIAGNOSIS — E7800 Pure hypercholesterolemia, unspecified: Secondary | ICD-10-CM

## 2018-04-24 DIAGNOSIS — R072 Precordial pain: Secondary | ICD-10-CM

## 2018-04-24 NOTE — Patient Instructions (Signed)
Medication Instructions:   NO CHANGE  Testing/Procedures:  Your physician has requested that you have an echocardiogram. Echocardiography is a painless test that uses sound waves to create images of your heart. It provides your doctor with information about the size and shape of your heart and how well your heart's chambers and valves are working. This procedure takes approximately one hour. There are no restrictions for this procedure.   Your physician has requested that you have an exercise tolerance test. For further information please visit [email or web address]. Please also follow instruction sheet, as given.    Follow-Up:  Your physician recommends that you schedule a follow-up appointment in: AS NEEDED PENDING TEST RESULTS    

## 2018-04-28 DIAGNOSIS — G4733 Obstructive sleep apnea (adult) (pediatric): Secondary | ICD-10-CM

## 2018-04-28 NOTE — Procedures (Signed)
   Patient Name: Monique Tyler, Monique Tyler Date: 04/23/2018 Gender: Female D.O.B: [birth year] Age (years): 53 Referring Provider: Jaclyn Shaggy Height (inches): 67 Interpreting Physician: Jetty Duhamel MD, ABSM Weight (lbs): 245 RPSGT: Celene Kras BMI: 38 MRN: [id number] Neck Size: 14.50  CLINICAL INFORMATION Sleep Study Type: HST Indication for sleep study: OSA  Epworth Sleepiness Score: 13  SLEEP STUDY TECHNIQUE A multi-channel overnight portable sleep study was performed. The channels recorded were: nasal airflow, thoracic respiratory movement, and oxygen saturation with a pulse oximetry. Snoring was also monitored.  MEDICATIONS Patient self administered medications include: not reported.  SLEEP ARCHITECTURE Patient was studied for 548.2 minutes. The sleep efficiency was 100.0 % and the patient was supine for 100%. The arousal index was 0.0 per hour.  RESPIRATORY PARAMETERS The overall AHI was 12.6 per hour, with a central apnea index of 0.0 per hour. The oxygen nadir was 81% during sleep.  CARDIAC DATA Mean heart rate during sleep was 59.0 bpm.  IMPRESSIONS - Mild obstructive sleep apnea occurred during this study (AHI = 12.6/h). - No significant central sleep apnea occurred during this study (CAI = 0.0/h). - Moderate oxygen desaturation was noted during this study (Min O2 = 81%, Mean 94%). - Patient snored.  DIAGNOSIS - Obstructive Sleep Apnea (327.23 [G47.33 ICD-10])  RECOMMENDATIONS - Suggest CPAP titration sleep study or DME autopap. Other options, including consultation or fitted oral appliance,would be based on clinical judgment. - Be careful with alcohol, sedatives and other CNS depressants that may worsen sleep apnea and disrupt normal sleep architecture. - Sleep hygiene should be reviewed to assess factors that may improve sleep quality. - Weight management and regular exercise should be initiated or continued.  [Electronically signed] 04/28/2018 11:09  AM  Jetty Duhamel MD, ABSM Diplomate, American Board of Sleep Medicine   NPI: [id number]                          Jetty Duhamel Diplomate, American Board of Sleep Medicine  ELECTRONICALLY SIGNED ON:  04/28/2018, 11:06 AM [facility] PH: [phone]   FX: [phone] ACCREDITED BY THE AMERICAN ACADEMY OF SLEEP MEDICINE

## 2018-05-01 ENCOUNTER — Other Ambulatory Visit: Payer: Self-pay | Admitting: Family Medicine

## 2018-05-01 DIAGNOSIS — G4739 Other sleep apnea: Secondary | ICD-10-CM

## 2018-05-02 ENCOUNTER — Telehealth: Payer: Self-pay

## 2018-05-02 ENCOUNTER — Ambulatory Visit (INDEPENDENT_AMBULATORY_CARE_PROVIDER_SITE_OTHER): Payer: 59

## 2018-05-02 ENCOUNTER — Other Ambulatory Visit: Payer: Self-pay

## 2018-05-02 ENCOUNTER — Ambulatory Visit (HOSPITAL_COMMUNITY): Payer: 59 | Attending: Internal Medicine

## 2018-05-02 DIAGNOSIS — R072 Precordial pain: Secondary | ICD-10-CM

## 2018-05-02 LAB — EXERCISE TOLERANCE TEST
CHL CUP RESTING HR STRESS: 61 {beats}/min
CHL RATE OF PERCEIVED EXERTION: 17
CSEPEDS: 28 s
Estimated workload: 6.3 METS
Exercise duration (min): 4 min
MPHR: 166 {beats}/min
Peak HR: 153 {beats}/min
Percent HR: 92 %

## 2018-05-02 NOTE — Telephone Encounter (Signed)
Patient was called and informed of sleep study results. 

## 2018-05-02 NOTE — Telephone Encounter (Signed)
-----   Message from Hoy Register, MD sent at 05/01/2018  1:22 PM EDT ----- Sleep study reveals the presence of sleep apnea but she will need a CPAP titration in order to provide settings for her CPAP machine.  The sleep center will call her to schedule this.

## 2018-05-14 ENCOUNTER — Telehealth: Payer: Self-pay | Admitting: Family Medicine

## 2018-05-14 NOTE — Telephone Encounter (Signed)
Sleep center called to inform PCP of patie

## 2018-06-13 ENCOUNTER — Encounter: Payer: Self-pay | Admitting: Family Medicine

## 2018-06-13 ENCOUNTER — Ambulatory Visit: Payer: 59 | Attending: Family Medicine | Admitting: Family Medicine

## 2018-06-13 ENCOUNTER — Telehealth: Payer: Self-pay | Admitting: Family Medicine

## 2018-06-13 DIAGNOSIS — F419 Anxiety disorder, unspecified: Secondary | ICD-10-CM | POA: Insufficient documentation

## 2018-06-13 DIAGNOSIS — Z9851 Tubal ligation status: Secondary | ICD-10-CM | POA: Insufficient documentation

## 2018-06-13 DIAGNOSIS — Z9889 Other specified postprocedural states: Secondary | ICD-10-CM | POA: Insufficient documentation

## 2018-06-13 DIAGNOSIS — E785 Hyperlipidemia, unspecified: Secondary | ICD-10-CM | POA: Insufficient documentation

## 2018-06-13 DIAGNOSIS — G4733 Obstructive sleep apnea (adult) (pediatric): Secondary | ICD-10-CM | POA: Diagnosis not present

## 2018-06-13 DIAGNOSIS — J3089 Other allergic rhinitis: Secondary | ICD-10-CM

## 2018-06-13 DIAGNOSIS — G43909 Migraine, unspecified, not intractable, without status migrainosus: Secondary | ICD-10-CM | POA: Insufficient documentation

## 2018-06-13 DIAGNOSIS — J309 Allergic rhinitis, unspecified: Secondary | ICD-10-CM | POA: Insufficient documentation

## 2018-06-13 DIAGNOSIS — Z91013 Allergy to seafood: Secondary | ICD-10-CM | POA: Insufficient documentation

## 2018-06-13 DIAGNOSIS — G43709 Chronic migraine without aura, not intractable, without status migrainosus: Secondary | ICD-10-CM | POA: Diagnosis not present

## 2018-06-13 DIAGNOSIS — Z79891 Long term (current) use of opiate analgesic: Secondary | ICD-10-CM | POA: Insufficient documentation

## 2018-06-13 DIAGNOSIS — Z79899 Other long term (current) drug therapy: Secondary | ICD-10-CM | POA: Insufficient documentation

## 2018-06-13 MED ORDER — HYDROXYZINE HCL 25 MG PO TABS: 25.0000 mg | ORAL_TABLET | Freq: Three times a day (TID) | ORAL | 3 refills | Status: DC | PRN

## 2018-06-13 MED ORDER — CETIRIZINE HCL 10 MG PO TABS: 10.0000 mg | ORAL_TABLET | Freq: Every day | ORAL | 6 refills | Status: DC

## 2018-06-13 MED ORDER — ALBUTEROL SULFATE HFA 108 (90 BASE) MCG/ACT IN AERS: 2.0000 | INHALATION_SPRAY | RESPIRATORY_TRACT | 1 refills | Status: DC | PRN

## 2018-06-13 MED ORDER — TOPIRAMATE 100 MG PO TABS: ORAL_TABLET | ORAL | 6 refills | Status: DC

## 2018-06-13 MED ORDER — FLUTICASONE PROPIONATE 50 MCG/ACT NA SUSP: 2.0000 | Freq: Every day | NASAL | 6 refills | Status: DC

## 2018-06-13 MED ORDER — FLUOXETINE HCL 20 MG PO CAPS: 20.0000 mg | ORAL_CAPSULE | Freq: Every day | ORAL | 6 refills | Status: DC

## 2018-06-13 NOTE — Progress Notes (Signed)
Subjective:  Patient ID: Monique Tyler, female    DOB: 1965-09-05  Age: 51 y.o. MRN: [id number]  CC: Anxiety   HPI Monique Tyler is a 52 year old right handed woman with a history of chronic migraines, right shoulder pain status post rotator cuff surgery in 07/2016, seasonal allergies, OSA (not on CPAP machine) here for follow-up visit. She had seen cardiology last month due to an episode of atypical chest pain for which she had been evaluated at the ED. Exercise Tolerance Test performed came back normal.  She complains of insomnia for which she had a home sleep study which revealed mild obstructive sleep apnea, CPAP titration DME autoPAP recommended.  I had ordered CPAP titration which was declined by [employer]. For anxiety she remains on Prozac (previously on BuSpar which was discontinued at her visit with NP) and hydroxyzine as needed and reports doing well but states she does have anxiety at other times and thinks this stems from her job which is intense as she is involved in substance abuse counseling. Her migraines have been controlled. Her allergies uncontrolled as she ran out of her Zyrtec and has been needing refill.  Past Medical History:  Diagnosis Date  . GERD (gastroesophageal reflux disease)   . Hyperlipidemia   . Hypertension    pt denies a hx of HTN  . Kidney stones   . Lung nodules   . Migraine   . Vertigo     Past Surgical History:  Procedure Laterality Date  . CESAREAN SECTION    . MANDIBLE SURGERY    . TUBAL LIGATION      Allergies  Allergen Reactions  . Shellfish Allergy Swelling  . Pork-Derived Products Nausea And Vomiting     Outpatient Medications Prior to Visit  Medication Sig Dispense Refill  . Cholecalciferol (VITAMIN D) 2000 units CAPS Take 2,000 Units by mouth daily.    . methocarbamol (ROBAXIN) 500 MG tablet Take 1 tablet (500 mg total) by mouth every 8 (eight) hours as needed for muscle spasms. 60 tablet 2  . ondansetron (ZOFRAN  ODT) 4 MG disintegrating tablet Take 1 tablet (4 mg total) by mouth every 8 (eight) hours as needed for nausea or vomiting. 20 tablet 0  . traMADol (ULTRAM) 50 MG tablet Take 1 tablet (50 mg total) by mouth every 6 (six) hours as needed. 15 tablet 0  . albuterol (PROVENTIL HFA;VENTOLIN HFA) 108 (90 Base) MCG/ACT inhaler Inhale 2 puffs into the lungs every 4 (four) hours as needed for wheezing or shortness of breath. 1 Inhaler 1  . cetirizine (ZYRTEC) 10 MG tablet Take 1 tablet (10 mg total) by mouth daily. 30 tablet 3  . diclofenac (VOLTAREN) 75 MG EC tablet Take 1 tablet (75 mg total) by mouth 2 (two) times daily. 60 tablet 2  . FLUoxetine (PROZAC) 20 MG capsule Take 1 capsule by mouth daily.    . fluticasone (FLONASE) 50 MCG/ACT nasal spray Place 2 sprays into both nostrils daily. 16 g 6  . gabapentin (NEURONTIN) 300 MG capsule Take 1 capsule (300 mg total) by mouth 2 (two) times daily. 60 capsule 3  . hydrOXYzine (ATARAX/VISTARIL) 25 MG tablet Take 1 tablet (25 mg total) by mouth 3 (three) times daily as needed (Increase night time dose 50 mg). 120 tablet 3  . topiramate (TOPAMAX) 100 MG tablet 200 mg in am 100 mg in pm (Patient taking differently: Take 100-200 mg by mouth See admin instructions. 200 mg in am 100 mg in pm) 45 tablet  6   No facility-administered medications prior to visit.     ROS Review of Systems  Constitutional: Negative for activity change, appetite change and fatigue.  HENT: Negative for congestion, sinus pressure and sore throat.   Eyes: Negative for visual disturbance.  Respiratory: Negative for cough, chest tightness, shortness of breath and wheezing.   Cardiovascular: Negative for chest pain and palpitations.  Gastrointestinal: Negative for abdominal distention, abdominal pain and constipation.  Endocrine: Negative for polydipsia.  Genitourinary: Negative for dysuria and frequency.  Musculoskeletal: Negative for arthralgias and back pain.  Skin: Negative for rash.   Neurological: Negative for tremors, light-headedness and numbness.  Hematological: Does not bruise/bleed easily.  Psychiatric/Behavioral: Positive for sleep disturbance. Negative for agitation and behavioral problems.    Objective:  BP 136/82   Pulse 67   Temp 98.3 F (36.8 C) (Oral)   Ht 5\' 7"  (1.702 m)   Wt 243 lb 12.8 oz (110.6 kg)   LMP 01/18/2012   SpO2 99%   BMI 38.18 kg/m   BP/Weight 06/13/2018 04/24/2018 04/23/2018  Systolic BP 136 112 -  Diastolic BP 82 88 -  Wt. (Lbs) 243.8 247 245  BMI 38.18 38.69 38.37      Physical Exam  Constitutional: She is oriented to person, place, and time. She appears well-developed and well-nourished.  Cardiovascular: Normal rate, normal heart sounds and intact distal pulses.  No murmur heard. Pulmonary/Chest: Effort normal and breath sounds normal. She has no wheezes. She has no rales. She exhibits no tenderness.  Abdominal: Soft. Bowel sounds are normal. She exhibits no distension and no mass. There is no tenderness.  Musculoskeletal: Normal range of motion.  Neurological: She is alert and oriented to person, place, and time.  Skin: Skin is warm and dry.  Psychiatric: She has a normal mood and affect.      Assessment & Plan:   1. Anxiety Controlled - hydrOXYzine (ATARAX/VISTARIL) 25 MG tablet; Take 1 tablet (25 mg total) by mouth 3 (three) times daily as needed (Increase night time dose 50 mg).  Dispense: 120 tablet; Refill: 3  2. Chronic migraine without aura without status migrainosus, not intractable Stable - topiramate (TOPAMAX) 100 MG tablet; 200 mg in am 100 mg in pm  Dispense: 45 tablet; Refill: 6  3. Seasonal allergic rhinitis due to other allergic trigger Uncontrolled Refill Zyrtec - fluticasone (FLONASE) 50 MCG/ACT nasal spray; Place 2 sprays into both nostrils daily.  Dispense: 16 g; Refill: 6 - cetirizine (ZYRTEC) 10 MG tablet; Take 1 tablet (10 mg total) by mouth daily.  Dispense: 30 tablet; Refill: 6 -  albuterol (PROVENTIL HFA;VENTOLIN HFA) 108 (90 Base) MCG/ACT inhaler; Inhale 2 puffs into the lungs every 4 (four) hours as needed for wheezing or shortness of breath.  Dispense: 1 Inhaler; Refill: 1  4.  Obstructive sleep apnea We will order auto titration at home  Meds ordered this encounter  Medications  . hydrOXYzine (ATARAX/VISTARIL) 25 MG tablet    Sig: Take 1 tablet (25 mg total) by mouth 3 (three) times daily as needed (Increase night time dose 50 mg).    Dispense:  120 tablet    Refill:  3  . topiramate (TOPAMAX) 100 MG tablet    Sig: 200 mg in am 100 mg in pm    Dispense:  45 tablet    Refill:  6  . FLUoxetine (PROZAC) 20 MG capsule    Sig: Take 1 capsule (20 mg total) by mouth daily.    Dispense:  30  capsule    Refill:  6  . fluticasone (FLONASE) 50 MCG/ACT nasal spray    Sig: Place 2 sprays into both nostrils daily.    Dispense:  16 g    Refill:  6  . cetirizine (ZYRTEC) 10 MG tablet    Sig: Take 1 tablet (10 mg total) by mouth daily.    Dispense:  30 tablet    Refill:  6  . albuterol (PROVENTIL HFA;VENTOLIN HFA) 108 (90 Base) MCG/ACT inhaler    Sig: Inhale 2 puffs into the lungs every 4 (four) hours as needed for wheezing or shortness of breath.    Dispense:  1 Inhaler    Refill:  1    Follow-up: Return in about 1 month (around 07/13/2018) for pap smear.   Hoy RegisterEnobong Dam Ashraf MD

## 2018-06-13 NOTE — Telephone Encounter (Signed)
gaya with [facility] called regarding the hydroxzine please follow up.

## 2018-06-13 NOTE — Telephone Encounter (Signed)
Monique JunglingGaya would like to get clarification regarding the medication instructions for the prescription.

## 2018-06-14 ENCOUNTER — Telehealth: Payer: Self-pay

## 2018-06-14 DIAGNOSIS — G4733 Obstructive sleep apnea (adult) (pediatric): Secondary | ICD-10-CM | POA: Insufficient documentation

## 2018-06-14 MED ORDER — MISC. DEVICES MISC: 0 refills | Status: DC

## 2018-06-14 NOTE — Telephone Encounter (Signed)
[facility], spoke to Terri regarding the CPAP auto-titration at home.  She explained that they do not do that from their office but the study can be ordered through [facility] or [facility].

## 2018-06-14 NOTE — Telephone Encounter (Signed)
Order has been placed. Thank you 

## 2018-06-14 NOTE — Telephone Encounter (Signed)
[facility] along with supporting documentation.

## 2018-06-17 NOTE — Telephone Encounter (Signed)
Pharmacy called back on Friday afternoon, RX for hydroxyzine was clarified: take one tablet po bid, 2tab qhs.

## 2018-06-19 ENCOUNTER — Telehealth: Payer: Self-pay

## 2018-06-19 NOTE — Telephone Encounter (Signed)
[facility] to inquire if the order for auto-titration was received. Spoke to B[place]ro[place]enny who said that she sent a fax to PCP with request for settings for the autotitration.  She said that default settings are 4-20.  She explained that they put an autopap in the home for a month with the settings ordered by the provider and then will send a report of the study to the provider and adjustments can be made to the settings.  She said that the machine that is placed in the home is for the patient to keep.  She stated that they are not able to do the study without the settings.   The fax was received and shared with Dr Alvis LemmingsNewlin as well as the information received from [place].

## 2018-06-26 ENCOUNTER — Telehealth: Payer: Self-pay

## 2018-06-26 NOTE — Telephone Encounter (Signed)
Call placed to the patient to discuss the need for scheduling an appointment with Dr Delford FieldWright for CPAP/APAP pressure setting assessment. She was in agreement and an appointment was scheduled for 07/05/18 @ 0900.

## 2018-07-05 ENCOUNTER — Encounter: Payer: Self-pay | Admitting: Critical Care Medicine

## 2018-07-05 ENCOUNTER — Telehealth: Payer: Self-pay

## 2018-07-05 ENCOUNTER — Ambulatory Visit: Payer: 59 | Attending: Critical Care Medicine | Admitting: Critical Care Medicine

## 2018-07-05 VITALS — BP 102/67 | HR 61 | Temp 98.2°F | Resp 18 | Ht 67.0 in | Wt 241.0 lb

## 2018-07-05 DIAGNOSIS — J45909 Unspecified asthma, uncomplicated: Secondary | ICD-10-CM | POA: Insufficient documentation

## 2018-07-05 DIAGNOSIS — G4733 Obstructive sleep apnea (adult) (pediatric): Secondary | ICD-10-CM | POA: Diagnosis not present

## 2018-07-05 DIAGNOSIS — R590 Localized enlarged lymph nodes: Secondary | ICD-10-CM | POA: Insufficient documentation

## 2018-07-05 DIAGNOSIS — Z8249 Family history of ischemic heart disease and other diseases of the circulatory system: Secondary | ICD-10-CM | POA: Insufficient documentation

## 2018-07-05 DIAGNOSIS — E785 Hyperlipidemia, unspecified: Secondary | ICD-10-CM | POA: Insufficient documentation

## 2018-07-05 DIAGNOSIS — G4710 Hypersomnia, unspecified: Secondary | ICD-10-CM | POA: Insufficient documentation

## 2018-07-05 DIAGNOSIS — Z87442 Personal history of urinary calculi: Secondary | ICD-10-CM | POA: Insufficient documentation

## 2018-07-05 DIAGNOSIS — Z79899 Other long term (current) drug therapy: Secondary | ICD-10-CM | POA: Insufficient documentation

## 2018-07-05 DIAGNOSIS — K219 Gastro-esophageal reflux disease without esophagitis: Secondary | ICD-10-CM | POA: Insufficient documentation

## 2018-07-05 DIAGNOSIS — I10 Essential (primary) hypertension: Secondary | ICD-10-CM | POA: Insufficient documentation

## 2018-07-05 DIAGNOSIS — Z87891 Personal history of nicotine dependence: Secondary | ICD-10-CM | POA: Insufficient documentation

## 2018-07-05 NOTE — Assessment & Plan Note (Signed)
Mild obstructive sleep apnea with associated hypersomnia Note this was a home sleep study and was not an observed study and no EEG monitoring occurred  Plan Obtain a full facemask and auto CPAP device with pressure ranges 5 to 20 cm water  We will obtain download report 2 weeks into the device use and assess for appropriate CPAP settings

## 2018-07-05 NOTE — Progress Notes (Signed)
Subjective:    Patient ID: Monique Tyler, female    DOB: 1966/03/06, 52 y.o.   MRN: [id number]  52 y.o.F referred for evaluation of sleep apnea.  This patient has had ongoing symptoms of headache, blood pressure this difficult to control, daytime hypersomnolence.  The patient will fall asleep while working on a computer or when sitting in the passenger seat of a car.  The patient states she has short-term memory deficits.  She does have snoring.  Her family has observed apneas.   The patient works as a Administrator, [employer] involved in helping patients with addictions  The patient did have a sleep study performed in October which showed an AHI index of 14 which was mild sleep apnea.  There were no central apneas.  This was a home study therefore no EEG monitoring was obtained.  The patient did desaturate to low 80% during the study.  She is referred now to evaluate for CPAP therapy and to determine the best course of a titration study   Her past history includes that of asthma with bronchitis.  She also had a history of mild hilar and subcarinal lymph node enlargement.  She has never seen a pulmonary physician for this.  There is no history of cough shortness of breath or chest pain.    Past Medical History:  Diagnosis Date  . GERD (gastroesophageal reflux disease)   . Hyperlipidemia   . Hypertension    pt denies a hx of HTN  . Kidney stones   . Lung nodules   . Migraine   . Vertigo      Family History  Problem Relation Age of Onset  . Hypertension Daughter   . Hypertension Son      Social History   Socioeconomic History  . Marital status: Married    Spouse name: Not on file  . Number of children: 7  . Years of education: Not on file  . Highest education level: Not on file  Occupational History  . Not on file  Social Needs  . Financial resource strain: Not on file  . Food insecurity:    Worry: Not on file    Inability: Not on file  . Transportation needs:    Medical: Not on file    Non-medical: Not on file  Tobacco Use  . Smoking status: Former Smoker    Types: Cigarettes  . Smokeless tobacco: Never Used  Substance and Sexual Activity  . Alcohol use: No    Alcohol/week: 0.0 standard drinks  . Drug use: No  . Sexual activity: Yes    Birth control/protection: Surgical  Lifestyle  . Physical activity:    Days per week: Not on file    Minutes per session: Not on file  . Stress: Not on file  Relationships  . Social connections:    Talks on phone: Not on file    Gets together: Not on file    Attends religious service: Not on file    Active member of club or organization: Not on file    Attends meetings of clubs or organizations: Not on file    Relationship status: Not on file  . Intimate partner violence:    Fear of current or ex partner: Not on file    Emotionally abused: Not on file    Physically abused: Not on file    Forced sexual activity: Not on file  Other Topics Concern  . Not on file  Social History Narrative  .  Not on file     Allergies  Allergen Reactions  . Shellfish Allergy Swelling  . Pork-Derived Products Nausea And Vomiting     Outpatient Medications Prior to Visit  Medication Sig Dispense Refill  . albuterol (PROVENTIL HFA;VENTOLIN HFA) 108 (90 Base) MCG/ACT inhaler Inhale 2 puffs into the lungs every 4 (four) hours as needed for wheezing or shortness of breath. 1 Inhaler 1  . cetirizine (ZYRTEC) 10 MG tablet Take 1 tablet (10 mg total) by mouth daily. 30 tablet 6  . Cholecalciferol (VITAMIN D) 2000 units CAPS Take 2,000 Units by mouth daily.    Marland Kitchen FLUoxetine (PROZAC) 20 MG capsule Take 1 capsule (20 mg total) by mouth daily. 30 capsule 6  . fluticasone (FLONASE) 50 MCG/ACT nasal spray Place 2 sprays into both nostrils daily. 16 g 6  . hydrOXYzine (ATARAX/VISTARIL) 25 MG tablet Take 1 tablet (25 mg total) by mouth 3 (three) times daily as needed (Increase night time dose 50 mg). 120 tablet 3  . methocarbamol  (ROBAXIN) 500 MG tablet Take 1 tablet (500 mg total) by mouth every 8 (eight) hours as needed for muscle spasms. 60 tablet 2  . Misc. Devices MISC Home Auto titration. Dx: Obstructive sleep apnea 1 each 0  . ondansetron (ZOFRAN ODT) 4 MG disintegrating tablet Take 1 tablet (4 mg total) by mouth every 8 (eight) hours as needed for nausea or vomiting. 20 tablet 0  . topiramate (TOPAMAX) 100 MG tablet 200 mg in am 100 mg in pm 45 tablet 6  . traMADol (ULTRAM) 50 MG tablet Take 1 tablet (50 mg total) by mouth every 6 (six) hours as needed. 15 tablet 0   No facility-administered medications prior to visit.       Review of Systems  Constitutional: Positive for fatigue and unexpected weight change.  HENT: Positive for congestion, postnasal drip, sinus pressure, sinus pain and trouble swallowing. Negative for nosebleeds.   Respiratory: Positive for apnea, choking and shortness of breath. Negative for wheezing.        DOE  Cardiovascular: Positive for chest pain and palpitations. Negative for leg swelling.       CV workup was negative   Gastrointestinal:       GERD +  Genitourinary: Positive for enuresis.  Musculoskeletal:       Weakness  Skin: Negative for rash.  Neurological: Positive for dizziness, weakness, light-headedness and headaches. Negative for speech difficulty.       Hx of migraines Memory loss  Frequent nocturnal awakenings       Objective:   Physical Exam Vitals:   07/05/18 0920  BP: 102/67  Pulse: 61  Resp: 18  Temp: 98.2 F (36.8 C)  TempSrc: Oral  SpO2: 99%  Weight: 241 lb (109.3 kg)  Height: 5\' 7"  (1.702 m)    Gen: Pleasant, obese, in no distress,  normal affect  ENT: No lesions,  mouth clear,  oropharynx clear, no postnasal drip  Neck: No JVD, no TMG, no carotid bruits  Lungs: No use of accessory muscles, no dullness to percussion, clear without rales or rhonchi  Cardiovascular: RRR, heart sounds normal, no murmur or gallops, no peripheral  edema  Abdomen: soft and NT, no HSM,  BS normal  Musculoskeletal: No deformities, no cyanosis or clubbing  Neuro: alert, non focal  Skin: Warm, no lesions or rashes  No results found.   CT of the chest obtained July 2019 revealed IMPRESSION: 1. Negative for acute pulmonary embolus or aortic dissection 2. Clear  lung fields 3. Similar appearance of mild hilar and mediastinal adenopathy.  Sleep study obtained May 22, 2018 was reviewed IMPRESSIONS - Mild obstructive sleep apnea occurred during this study (AHI =  12.6/h). - No significant central sleep apnea occurred during this study  (CAI = 0.0/h). - Moderate oxygen desaturation was noted during this study (Min  O2 = 81%, Mean 94%). - Patient snored.    Assessment & Plan:  I personally reviewed all images and lab [facility] as well as any outside material available during this office visit and agree with the  radiology impressions.   Obstructive sleep apnea Mild obstructive sleep apnea with associated hypersomnia Note this was a home sleep study and was not an observed study and no EEG monitoring occurred  Plan Obtain a full facemask and auto CPAP device with pressure ranges 5 to 20 cm water  We will obtain download report 2 weeks into the device use and assess for appropriate CPAP settings  Mediastinal adenopathy History of mediastinal adenopathy that is benign  Plan  Observation for now   Caydee was seen today for orders.  Diagnoses and all orders for this visit:  Obstructive sleep apnea -     For home use only DME continuous positive airway pressure (CPAP)  Mediastinal adenopathy

## 2018-07-05 NOTE — Assessment & Plan Note (Signed)
History of mediastinal adenopathy that is benign  Plan  Observation for now

## 2018-07-05 NOTE — Patient Instructions (Signed)
An auto set CPAP will be ordered  We will obtain a 2-week download result and review to determine your fixed CPAP settings  Return in 1 month to assess response

## 2018-07-05 NOTE — Telephone Encounter (Signed)
Pres[facility]

## 2018-07-15 ENCOUNTER — Ambulatory Visit (HOSPITAL_BASED_OUTPATIENT_CLINIC_OR_DEPARTMENT_OTHER): Payer: 59 | Admitting: Family Medicine

## 2018-07-15 ENCOUNTER — Other Ambulatory Visit (HOSPITAL_COMMUNITY)
Admission: RE | Admit: 2018-07-15 | Discharge: 2018-07-15 | Disposition: A | Discharge status: Home / Self Care | Payer: 59 | Source: Ambulatory Visit | Attending: Family Medicine | Admitting: Family Medicine

## 2018-07-15 ENCOUNTER — Encounter: Payer: Self-pay | Admitting: Family Medicine

## 2018-07-15 VITALS — BP 126/80 | HR 68 | Temp 97.9°F | Ht 67.0 in | Wt 243.4 lb

## 2018-07-15 DIAGNOSIS — E785 Hyperlipidemia, unspecified: Secondary | ICD-10-CM

## 2018-07-15 DIAGNOSIS — K219 Gastro-esophageal reflux disease without esophagitis: Secondary | ICD-10-CM

## 2018-07-15 DIAGNOSIS — Z1211 Encounter for screening for malignant neoplasm of colon: Secondary | ICD-10-CM | POA: Diagnosis not present

## 2018-07-15 DIAGNOSIS — Z79899 Other long term (current) drug therapy: Secondary | ICD-10-CM

## 2018-07-15 DIAGNOSIS — Z124 Encounter for screening for malignant neoplasm of cervix: Secondary | ICD-10-CM | POA: Insufficient documentation

## 2018-07-15 NOTE — Progress Notes (Signed)
Subjective:  Patient ID: Monique Tyler, female    DOB: 03-04-1966  Age: 52 y.o. MRN: [id number]  CC: Gynecologic Exam   HPI Monique Tyler  is a [age] year old right handed woman with a history of chronic migraines, right shoulder pain status post rotator cuff surgery in 07/2016, seasonal allergies, OSA (not on CPAP machine) here for Pap smear She has no additional concerns today. Referred for colonoscopy and mammogram in 10/2017 however notes indicate low [facility] was unable to contact the patient and she states she never heard anything about her mammogram.  Past Medical History:  Diagnosis Date  . GERD (gastroesophageal reflux disease)   . Hyperlipidemia   . Hypertension    pt denies a hx of HTN  . Kidney stones   . Lung nodules   . Migraine   . Vertigo     Past Surgical History:  Procedure Laterality Date  . CESAREAN SECTION    . MANDIBLE SURGERY    . TUBAL LIGATION     Allergies  Allergen Reactions  . Shellfish Allergy Swelling  . Pork-Derived Products Nausea And Vomiting      Outpatient Medications Prior to Visit  Medication Sig Dispense Refill  . albuterol (PROVENTIL HFA;VENTOLIN HFA) 108 (90 Base) MCG/ACT inhaler Inhale 2 puffs into the lungs every 4 (four) hours as needed for wheezing or shortness of breath. 1 Inhaler 1  . cetirizine (ZYRTEC) 10 MG tablet Take 1 tablet (10 mg total) by mouth daily. 30 tablet 6  . Cholecalciferol (VITAMIN D) 2000 units CAPS Take 2,000 Units by mouth daily.    Marland Kitchen. FLUoxetine (PROZAC) 20 MG capsule Take 1 capsule (20 mg total) by mouth daily. 30 capsule 6  . fluticasone (FLONASE) 50 MCG/ACT nasal spray Place 2 sprays into both nostrils daily. 16 g 6  . hydrOXYzine (ATARAX/VISTARIL) 25 MG tablet Take 1 tablet (25 mg total) by mouth 3 (three) times daily as needed (Increase night time dose 50 mg). 120 tablet 3  . methocarbamol (ROBAXIN) 500 MG tablet Take 1 tablet (500 mg total) by mouth every 8 (eight) hours as needed for muscle spasms. 60  tablet 2  . Misc. Devices MISC Home Auto titration. Dx: Obstructive sleep apnea 1 each 0  . ondansetron (ZOFRAN ODT) 4 MG disintegrating tablet Take 1 tablet (4 mg total) by mouth every 8 (eight) hours as needed for nausea or vomiting. 20 tablet 0  . topiramate (TOPAMAX) 100 MG tablet 200 mg in am 100 mg in pm 45 tablet 6  . traMADol (ULTRAM) 50 MG tablet Take 1 tablet (50 mg total) by mouth every 6 (six) hours as needed. 15 tablet 0   No facility-administered medications prior to visit.     ROS Review of Systems  Constitutional: Negative for activity change, appetite change and fatigue.  HENT: Negative for congestion, sinus pressure and sore throat.   Eyes: Negative for visual disturbance.  Respiratory: Negative for cough, chest tightness, shortness of breath and wheezing.   Cardiovascular: Negative for chest pain and palpitations.  Gastrointestinal: Negative for abdominal distention, abdominal pain and constipation.  Endocrine: Negative for polydipsia.  Genitourinary: Negative for dysuria and frequency.  Musculoskeletal: Negative for arthralgias and back pain.  Skin: Negative for rash.  Neurological: Negative for tremors, light-headedness and numbness.  Hematological: Does not bruise/bleed easily.  Psychiatric/Behavioral: Negative for agitation and behavioral problems.    Objective:  BP 126/80   Pulse 68   Temp 97.9 F (36.6 C) (Oral)   Ht 5\' 7"  (  1.702 m)   Wt 243 lb 6.4 oz (110.4 kg)   LMP 01/18/2012   SpO2 98%   BMI 38.12 kg/m   BP/Weight 07/15/2018 07/05/2018 06/13/2018  Systolic BP 126 102 136  Diastolic BP 80 67 82  Wt. (Lbs) 243.4 241 243.8  BMI 38.12 37.75 38.18      Physical Exam Constitutional:      Appearance: She is well-developed.  Cardiovascular:     Rate and Rhythm: Normal rate.     Heart sounds: Normal heart sounds. No murmur.  Pulmonary:     Effort: Pulmonary effort is normal.     Breath sounds: Normal breath sounds. No wheezing or rales.    Chest:     Chest wall: No tenderness.  Abdominal:     General: Bowel sounds are normal. There is no distension.     Palpations: Abdomen is soft. There is no mass.     Tenderness: There is no abdominal tenderness.  Genitourinary:    Comments: External genitalia, vagina, cervix, adnexa -normal Musculoskeletal: Normal range of motion.  Neurological:     Mental Status: She is alert and oriented to person, place, and time.  Psychiatric:        Mood and Affect: Mood normal.        Behavior: Behavior normal.      Assessment & Plan:   1. Screening for cervical cancer - Cytology - PAP([facility])  2. Screening for colon cancer - Ambulatory referral to Gastroenterology  We have provided her with the number to the breast center and to [facility] to call and schedule her appointments for mammogram and colonoscopy.  No orders of the defined types were placed in this encounter.   Follow-up: Return in about 6 months (around 01/14/2019) for Follow-up of chronic medical conditions.   Hoy RegisterEnobong Trellis Guirguis MD

## 2018-07-19 LAB — CYTOLOGY - PAP
Diagnosis: NEGATIVE
HPV (WINDOPATH): NOT DETECTED

## 2018-07-23 ENCOUNTER — Telehealth: Payer: Self-pay

## 2018-07-23 NOTE — Telephone Encounter (Signed)
-----   Message from Hoy RegisterEnobong Newlin, MD sent at 07/22/2018  5:04 PM EST ----- Pap smear is negative for malignancy

## 2018-07-23 NOTE — Telephone Encounter (Signed)
Patient was called and a voicemail was left informing patient of lab results. 

## 2018-07-29 ENCOUNTER — Telehealth: Payer: Self-pay

## 2018-07-29 NOTE — Telephone Encounter (Signed)
Call placed to [facility], spoke to [place] who confirmed that the patient received the CPAP machine.

## 2018-08-07 ENCOUNTER — Telehealth: Payer: Self-pay

## 2018-08-07 ENCOUNTER — Ambulatory Visit: Payer: Self-pay | Attending: Family Medicine | Admitting: Critical Care Medicine

## 2018-08-07 ENCOUNTER — Encounter: Payer: Self-pay | Admitting: Critical Care Medicine

## 2018-08-07 ENCOUNTER — Ambulatory Visit: Payer: Self-pay | Admitting: Family Medicine

## 2018-08-07 VITALS — BP 114/74 | HR 67 | Temp 98.7°F | Resp 20 | Wt 240.0 lb

## 2018-08-07 DIAGNOSIS — G4733 Obstructive sleep apnea (adult) (pediatric): Secondary | ICD-10-CM

## 2018-08-07 NOTE — Assessment & Plan Note (Signed)
Obstructive sleep apnea with significant desaturation and very high levels of AHI.  The patient is now on CPAP therapy and is compliant with same.  The patient is awaiting a download report which will occur at the end of this month.  Plan Continue CPAP at current dose level Follow-up download report which will occur at the end of January

## 2018-08-07 NOTE — Patient Instructions (Signed)
No change in medications at this time Continue to use your CPAP machine every night and as needed during the day if you were to take a nap We will obtain the download report from [facility] and I will call you with that result Based on the result of the download we will give orders for specific settings of your CPAP machine Return for follow-up with Dr. Delford Field in 3 months

## 2018-08-07 NOTE — Progress Notes (Signed)
Subjective:    Patient ID: Monique Tyler, female    DOB: [birth year], 53 y.o.   MRN: [id number]  53 y.o.F referred for evaluation of sleep apnea.  This patient has had ongoing symptoms of headache, blood pressure this difficult to control, daytime hypersomnolence.  The patient will fall asleep while working on a computer or when sitting in the passenger seat of a car.  The patient states she has short-term memory deficits.  She does have snoring.  Her family has observed apneas.   The patient works as a Administrator, [employer] involved in helping patients with addictions  The patient did have a sleep study performed in October which showed an AHI index of 14 which was mild sleep apnea.  There were no central apneas.  This was a home study therefore no EEG monitoring was obtained.  The patient did desaturate to low 80% during the study.  She is referred now to evaluate for CPAP therapy and to determine the best course of a titration study   Her past history includes that of asthma with bronchitis.  She also had a history of mild hilar and subcarinal lymph node enlargement.  She has never seen a pulmonary physician for this.  There is no history of cough shortness of breath or chest pain.    08/07/2018 Here for f/u OSA  Uses full face mask, on cpap now.  occ leaks. Humidity Is compliant.  Occupation : psychotherapist .  Dyspnea is the same.  Stress on the job. Diet:  bkft coffee.  Lunch grilled vegies  Dinner raisen toast [place] Monique Tyler.    PB honey sandwich.    Past Medical History:  Diagnosis Date  . GERD (gastroesophageal reflux disease)   . Hyperlipidemia   . Hypertension    pt denies a hx of HTN  . Kidney stones   . Lung nodules   . Migraine   . Vertigo      Family History  Problem Relation Age of Onset  . Hypertension Daughter   . Hypertension Son      Social History   Socioeconomic History  . Marital status: Married    Spouse name: Not on file  . Number of  children: 7  . Years of education: Not on file  . Highest education level: Not on file  Occupational History  . Not on file  Social Needs  . Financial resource strain: Not on file  . Food insecurity:    Worry: Not on file    Inability: Not on file  . Transportation needs:    Medical: Not on file    Non-medical: Not on file  Tobacco Use  . Smoking status: Former Smoker    Types: Cigarettes  . Smokeless tobacco: Never Used  Substance and Sexual Activity  . Alcohol use: No    Alcohol/week: 0.0 standard drinks  . Drug use: No  . Sexual activity: Yes    Birth control/protection: Surgical  Lifestyle  . Physical activity:    Days per week: Not on file    Minutes per session: Not on file  . Stress: Not on file  Relationships  . Social connections:    Talks on phone: Not on file    Gets together: Not on file    Attends religious service: Not on file    Active member of club or organization: Not on file    Attends meetings of clubs or organizations: Not on file    Relationship status: Not  on file  . Intimate partner violence:    Fear of current or ex partner: Not on file    Emotionally abused: Not on file    Physically abused: Not on file    Forced sexual activity: Not on file  Other Topics Concern  . Not on file  Social History Narrative  . Not on file     Allergies  Allergen Reactions  . Shellfish Allergy Swelling  . Pork-Derived Products Nausea And Vomiting     Outpatient Medications Prior to Visit  Medication Sig Dispense Refill  . albuterol (PROVENTIL HFA;VENTOLIN HFA) 108 (90 Base) MCG/ACT inhaler Inhale 2 puffs into the lungs every 4 (four) hours as needed for wheezing or shortness of breath. 1 Inhaler 1  . cetirizine (ZYRTEC) 10 MG tablet Take 1 tablet (10 mg total) by mouth daily. 30 tablet 6  . Cholecalciferol (VITAMIN D) 2000 units CAPS Take 2,000 Units by mouth daily.    Marland Kitchen FLUoxetine (PROZAC) 20 MG capsule Take 1 capsule (20 mg total) by mouth daily. 30  capsule 6  . fluticasone (FLONASE) 50 MCG/ACT nasal spray Place 2 sprays into both nostrils daily. 16 g 6  . hydrOXYzine (ATARAX/VISTARIL) 25 MG tablet Take 1 tablet (25 mg total) by mouth 3 (three) times daily as needed (Increase night time dose 50 mg). 120 tablet 3  . methocarbamol (ROBAXIN) 500 MG tablet Take 1 tablet (500 mg total) by mouth every 8 (eight) hours as needed for muscle spasms. 60 tablet 2  . Misc. Devices MISC Home Auto titration. Dx: Obstructive sleep apnea 1 each 0  . ondansetron (ZOFRAN ODT) 4 MG disintegrating tablet Take 1 tablet (4 mg total) by mouth every 8 (eight) hours as needed for nausea or vomiting. 20 tablet 0  . topiramate (TOPAMAX) 100 MG tablet 200 mg in am 100 mg in pm 45 tablet 6  . traMADol (ULTRAM) 50 MG tablet Take 1 tablet (50 mg total) by mouth every 6 (six) hours as needed. 15 tablet 0   No facility-administered medications prior to visit.       Review of Systems  Constitutional: Positive for fatigue and unexpected weight change.  HENT: Positive for congestion, postnasal drip, sinus pressure, sinus pain and trouble swallowing. Negative for nosebleeds.   Respiratory: Positive for apnea, choking and shortness of breath. Negative for wheezing.        DOE  Cardiovascular: Positive for chest pain and palpitations. Negative for leg swelling.       CV workup was negative   Gastrointestinal:       GERD +  Genitourinary: Positive for enuresis.  Musculoskeletal:       Weakness  Skin: Negative for rash.  Neurological: Positive for dizziness, weakness, light-headedness and headaches. Negative for speech difficulty.       Hx of migraines Memory loss  Frequent nocturnal awakenings       Objective:   Physical Exam  Vitals:   08/07/18 0909  BP: 114/74  Pulse: 67  Resp: 20  Temp: 98.7 F (37.1 C)  Weight: 240 lb (108.9 kg)    Gen: Pleasant, obese, in no distress,  normal affect  ENT: No lesions,  mouth clear,  oropharynx clear, no postnasal  drip  Neck: No JVD, no TMG, no carotid bruits  Lungs: No use of accessory muscles, no dullness to percussion, clear without rales or rhonchi  Cardiovascular: RRR, heart sounds normal, no murmur or gallops, no peripheral edema  Abdomen: soft and NT, no HSM,  BS normal  Musculoskeletal: No deformities, no cyanosis or clubbing  Neuro: alert, non focal  Skin: Warm, no lesions or rashes  No results found.   CT of the chest obtained July 2019 revealed IMPRESSION: 1. Negative for acute pulmonary embolus or aortic dissection 2. Clear lung fields 3. Similar appearance of mild hilar and mediastinal adenopathy.  Sleep study obtained May 22, 2018 was reviewed IMPRESSIONS - Mild obstructive sleep apnea occurred during this study (AHI =  12.6/h). - No significant central sleep apnea occurred during this study  (CAI = 0.0/h). - Moderate oxygen desaturation was noted during this study (Min  O2 = 81%, Mean 94%). - Patient snored.    Assessment & Plan:  I personally reviewed all images and [facility] as well as any outside material available during this office visit and agree with the  radiology impressions.   Obstructive sleep apnea Obstructive sleep apnea with significant desaturation and very high levels of AHI.  The patient is now on CPAP therapy and is compliant with same.  The patient is awaiting a download report which will occur at the end of this month.  Plan Continue CPAP at current dose level Follow-up download report which will occur at the end of January   Monique Tyler was seen today for follow-up.  Diagnoses and all orders for this visit:  Obstructive sleep apnea

## 2018-08-07 NOTE — Progress Notes (Signed)
Results for CPAP Has CPAP machine at home Needs a new mask

## 2018-08-07 NOTE — Telephone Encounter (Signed)
Call placed to [facility] to check on the status of CPAP report. Spoke to [place] who stated that the machine has not been in the home a month. It was set up 07/18/18.  They will fax report to Dr Delford Field at [facility] when ready.  Update provided to Dr Delford Field

## 2018-08-20 ENCOUNTER — Telehealth: Payer: Self-pay | Admitting: Critical Care Medicine

## 2018-08-20 DIAGNOSIS — G4733 Obstructive sleep apnea (adult) (pediatric): Secondary | ICD-10-CM

## 2018-08-20 NOTE — Telephone Encounter (Signed)
I spoke with the patient today by phone and informed her that her AutoSet CPAP results were excellent that she had good compliance and that it looks like 10 cm water pressure would result in the best elimination of her apneas  I will order CPAP at 10 cm water pressure from the [facility]

## 2018-08-21 ENCOUNTER — Telehealth: Payer: Self-pay

## 2018-08-21 NOTE — Telephone Encounter (Signed)
Order for CPAP faxed to [facility] 

## 2018-08-23 NOTE — Telephone Encounter (Signed)
Call placed to [facility] to check on status of order for CPAP pressure change.  Spoke to [place] who stated taht the received the order and she will contact the patient to today to schedule appointment.

## 2018-08-29 MED FILL — FLUoxetine HCL 20 MG CAPS: 20 | 30 days supply | Qty: 30 | Fill #0

## 2018-08-29 MED FILL — !VENTOLIN HFA INHALER: 108 (90 BAS | 16 days supply | Qty: 18 | Fill #0

## 2018-08-29 MED FILL — ONDANSETRON ODT 4 MG TABLET: 4 | 6 days supply | Qty: 20 | Fill #0

## 2018-08-29 MED FILL — TOPIRAMATE 100 MG TABS: 100 | 30 days supply | Qty: 90 | Fill #0

## 2018-08-29 MED FILL — hydrOXYzine HCL 25 MG TABS: 25 | 30 days supply | Qty: 120 | Fill #0

## 2018-08-29 MED FILL — METHOCARBAMOL 500 MG TABS: 500 | 30 days supply | Qty: 90 | Fill #0

## 2018-08-29 MED FILL — FLUTICASONE PROP 50 MCG SPR: 50 | 30 days supply | Qty: 16 | Fill #0

## 2018-09-22 NOTE — Progress Notes (Signed)
Subjective:    Patient ID: Monique Tyler, female    DOB: Apr 30, 1966, 53 y.o.   MRN: [id number]  53 y.o.F referred for evaluation of sleep apnea.  This patient has had ongoing symptoms of headache, blood pressure this difficult to control, daytime hypersomnolence.  The patient will fall asleep while working on a computer or when sitting in the passenger seat of a car.  The patient states she has short-term memory deficits.  She does have snoring.  Her family has observed apneas.   The patient works as a Administrator, [employer] involved in helping patients with addictions  The patient did have a sleep study performed in October which showed an AHI index of 14 which was mild sleep apnea.  There were no central apneas.  This was a home study therefore no EEG monitoring was obtained.  The patient did desaturate to low 80% during the study.  She is referred now to evaluate for CPAP therapy and to determine the best course of a titration study   Her past history includes that of asthma with bronchitis.  She also had a history of mild hilar and subcarinal lymph node enlargement.  She has never seen a pulmonary physician for this.  There is no history of cough shortness of breath or chest pain.    09/23/2018 Here for f/u OSA  Uses full face mask, on cpap now.  occ leaks. Humidity The patient remains compliant with her CPAP machine.  The patient has a dry cough which is slightly increased.  She denies any mold exposure.  She has some postnasal drainage.  The throat is not sore.  She does have some ear itching.  Her download report from January 28 showed excellent compliance with CPAP and good treatment of her AHI is   Past Medical History:  Diagnosis Date  . GERD (gastroesophageal reflux disease)   . Hyperlipidemia   . Hypertension    pt denies a hx of HTN  . Kidney stones   . Lung nodules   . Migraine   . Vertigo      Family History  Problem Relation Age of Onset  . Hypertension  Daughter   . Hypertension Son      Social History   Socioeconomic History  . Marital status: Married    Spouse name: Not on file  . Number of children: 7  . Years of education: Not on file  . Highest education level: Not on file  Occupational History  . Not on file  Social Needs  . Financial resource strain: Not on file  . Food insecurity:    Worry: Not on file    Inability: Not on file  . Transportation needs:    Medical: Not on file    Non-medical: Not on file  Tobacco Use  . Smoking status: Former Smoker    Types: Cigarettes  . Smokeless tobacco: Never Used  Substance and Sexual Activity  . Alcohol use: No    Alcohol/week: 0.0 standard drinks  . Drug use: No  . Sexual activity: Yes    Birth control/protection: Surgical  Lifestyle  . Physical activity:    Days per week: Not on file    Minutes per session: Not on file  . Stress: Not on file  Relationships  . Social connections:    Talks on phone: Not on file    Gets together: Not on file    Attends religious service: Not on file    Active member  of club or organization: Not on file    Attends meetings of clubs or organizations: Not on file    Relationship status: Not on file  . Intimate partner violence:    Fear of current or ex partner: Not on file    Emotionally abused: Not on file    Physically abused: Not on file    Forced sexual activity: Not on file  Other Topics Concern  . Not on file  Social History Narrative  . Not on file     Allergies  Allergen Reactions  . Shellfish Allergy Swelling  . Pork-Derived Products Nausea And Vomiting     Outpatient Medications Prior to Visit  Medication Sig Dispense Refill  . albuterol (PROVENTIL HFA;VENTOLIN HFA) 108 (90 Base) MCG/ACT inhaler Inhale 2 puffs into the lungs every 4 (four) hours as needed for wheezing or shortness of breath. 1 Inhaler 1  . cetirizine (ZYRTEC) 10 MG tablet Take 1 tablet (10 mg total) by mouth daily. 30 tablet 6  . Cholecalciferol  (VITAMIN D) 2000 units CAPS Take 2,000 Units by mouth daily.    Marland Kitchen FLUoxetine (PROZAC) 20 MG capsule Take 1 capsule (20 mg total) by mouth daily. 30 capsule 6  . fluticasone (FLONASE) 50 MCG/ACT nasal spray Place 2 sprays into both nostrils daily. 16 g 6  . hydrOXYzine (ATARAX/VISTARIL) 25 MG tablet Take 1 tablet (25 mg total) by mouth 3 (three) times daily as needed (Increase night time dose 50 mg). 120 tablet 3  . methocarbamol (ROBAXIN) 500 MG tablet Take 1 tablet (500 mg total) by mouth every 8 (eight) hours as needed for muscle spasms. 60 tablet 2  . Misc. Devices MISC Home Auto titration. Dx: Obstructive sleep apnea 1 each 0  . ondansetron (ZOFRAN ODT) 4 MG disintegrating tablet Take 1 tablet (4 mg total) by mouth every 8 (eight) hours as needed for nausea or vomiting. 20 tablet 0  . topiramate (TOPAMAX) 100 MG tablet 200 mg in am 100 mg in pm 45 tablet 6  . traMADol (ULTRAM) 50 MG tablet Take 1 tablet (50 mg total) by mouth every 6 (six) hours as needed. 15 tablet 0   No facility-administered medications prior to visit.       Review of Systems  Constitutional: Positive for fatigue and unexpected weight change.  HENT: Positive for postnasal drip. Negative for congestion, nosebleeds, sinus pressure, sinus pain and trouble swallowing.   Respiratory: Positive for cough. Negative for apnea, choking, shortness of breath and wheezing.        DOE  Cardiovascular: Negative for chest pain, palpitations and leg swelling.       CV workup was negative   Gastrointestinal:       GERD +  Genitourinary: Negative for enuresis.  Skin: Negative for rash.  Neurological: Negative for dizziness, speech difficulty, weakness, light-headedness and headaches.       Hx of migraines        Objective:   Physical Exam Vitals:   09/23/18 0911  BP: 134/84  Pulse: 67  Resp: 19  Temp: 98.7 F (37.1 C)  SpO2: 91%    Gen: Pleasant, obese, in no distress,  normal affect  ENT: No lesions,  mouth clear,   oropharynx clear, no postnasal drip  Neck: No JVD, no TMG, no carotid bruits  Lungs: No use of accessory muscles, no dullness to percussion, clear without rales or rhonchi  Cardiovascular: RRR, heart sounds normal, no murmur or gallops, no peripheral edema  Abdomen: soft and  NT, no HSM,  BS normal  Musculoskeletal: No deformities, no cyanosis or clubbing  Neuro: alert, non focal  Skin: Warm, no lesions or rashes  No results found.   CT of the chest obtained July 2019 revealed IMPRESSION: 1. Negative for acute pulmonary embolus or aortic dissection 2. Clear lung fields 3. Similar appearance of mild hilar and mediastinal adenopathy.  Sleep study obtained May 22, 2018 was reviewed IMPRESSIONS - Mild obstructive sleep apnea occurred during this study (AHI =  12.6/h). - No significant central sleep apnea occurred during this study  (CAI = 0.0/h). - Moderate oxygen desaturation was noted during this study (Min  O2 = 81%, Mean 94%). - Patient snored.    Assessment & Plan:  I personally reviewed all images and lab data in the [facility] system as well as any outside material available during this office visit and agree with the  radiology impressions.   Obstructive sleep apnea Obstructive sleep apnea with excellent control on CPAP setting of 10 cm water pressure  We will continue full facemask intensities water pressure CPAP at night without change  Seasonal allergies The cough appears to be from postnasal drainage and there is no evidence of active infection at this time  The patient was recommended to continue the Flonase and Claritin daily   Monique Tyler was seen today for hospitalization follow-up.  Diagnoses and all orders for this visit:  OSA on CPAP  Colon cancer screening -     Fecal occult blood, imunochemical  Obstructive sleep apnea  Seasonal allergies   The patient needs colon cancer screening and since she does not have insurance we will obtain for her a  fecal occult blood immunochemical test

## 2018-09-23 ENCOUNTER — Encounter: Payer: Self-pay | Admitting: Critical Care Medicine

## 2018-09-23 ENCOUNTER — Ambulatory Visit: Payer: Self-pay | Attending: Critical Care Medicine | Admitting: Critical Care Medicine

## 2018-09-23 ENCOUNTER — Other Ambulatory Visit: Payer: Self-pay

## 2018-09-23 VITALS — BP 134/84 | HR 67 | Temp 98.7°F | Resp 19

## 2018-09-23 DIAGNOSIS — Z9989 Dependence on other enabling machines and devices: Secondary | ICD-10-CM

## 2018-09-23 DIAGNOSIS — G4733 Obstructive sleep apnea (adult) (pediatric): Secondary | ICD-10-CM

## 2018-09-23 DIAGNOSIS — J302 Other seasonal allergic rhinitis: Secondary | ICD-10-CM

## 2018-09-23 DIAGNOSIS — Z1211 Encounter for screening for malignant neoplasm of colon: Secondary | ICD-10-CM

## 2018-09-23 NOTE — Assessment & Plan Note (Signed)
The cough appears to be from postnasal drainage and there is no evidence of active infection at this time  The patient was recommended to continue the Flonase and Claritin daily

## 2018-09-23 NOTE — Patient Instructions (Signed)
Stay on CPAP every night We will schedule you for earwax removal We will give you a stool card for colon cancer screening Return to see Dr. Delford Field 6 months and keep your next primary care appointment as scheduled

## 2018-09-23 NOTE — Progress Notes (Signed)
Patient arrived ambulatory, alert and orientated with a need ogf a follow up after she received her CPAP.  Patient states she does have a cough and congestion.  Patient given a mask.  Patient state CPAP is "helping a little."  Patient SPO2 is low but artificial nails notes.

## 2018-09-23 NOTE — Assessment & Plan Note (Signed)
Obstructive sleep apnea with excellent control on CPAP setting of 10 cm water pressure  We will continue full facemask intensities water pressure CPAP at night without change

## 2018-09-26 ENCOUNTER — Other Ambulatory Visit: Payer: Self-pay

## 2018-09-26 ENCOUNTER — Ambulatory Visit: Payer: Self-pay | Attending: Family Medicine | Admitting: Emergency Medicine

## 2018-09-26 VITALS — BP 120/68 | HR 72 | Temp 98.2°F | Resp 18 | Wt 240.4 lb

## 2018-09-26 DIAGNOSIS — H6123 Impacted cerumen, bilateral: Secondary | ICD-10-CM

## 2018-09-26 MED FILL — TOPIRAMATE 100 MG TABS: 100 | 30 days supply | Qty: 90 | Fill #1

## 2018-09-26 MED FILL — FLUoxetine HCL 20 MG CAPS: 20 | 30 days supply | Qty: 30 | Fill #1

## 2018-09-26 NOTE — Progress Notes (Signed)
Patient came in for ear wax removal.  Nurse used debrox for 10 minutes in each ear.  Then bilateral ears were flushed with a diluted water and hydroxide solution.

## 2018-10-16 MED FILL — FLUoxetine HCL 20 MG CAPS: 20 | 30 days supply | Qty: 30 | Fill #2

## 2018-10-16 MED FILL — TOPIRAMATE 100 MG TABS: 100 | 30 days supply | Qty: 90 | Fill #2

## 2018-11-06 ENCOUNTER — Ambulatory Visit: Payer: Self-pay | Attending: Critical Care Medicine | Admitting: Critical Care Medicine

## 2018-11-06 ENCOUNTER — Encounter: Payer: Self-pay | Admitting: Critical Care Medicine

## 2018-11-06 ENCOUNTER — Other Ambulatory Visit: Payer: Self-pay

## 2018-11-06 DIAGNOSIS — G4733 Obstructive sleep apnea (adult) (pediatric): Secondary | ICD-10-CM

## 2018-11-06 DIAGNOSIS — J302 Other seasonal allergic rhinitis: Secondary | ICD-10-CM

## 2018-11-06 MED ORDER — ALBUTEROL SULFATE (2.5 MG/3ML) 0.083% IN NEBU: 2.5000 mg | INHALATION_SOLUTION | Freq: Four times a day (QID) | RESPIRATORY_TRACT | 12 refills | Status: AC | PRN

## 2018-11-06 MED FILL — ALBUTEROL SUL 2.5 MG/3 ML S: (2.5 MG/3ML | 6 days supply | Qty: 75 | Fill #0

## 2018-11-06 NOTE — Progress Notes (Signed)
Pt states she was exposed to the virus   Pt states she was tested and her test comes back today

## 2018-11-06 NOTE — Progress Notes (Signed)
Patient ID: Monique Tyler Antilla, female   DOB: 1966-03-24, 53 y.o.   MRN: [id number] Virtual Visit via Telephone Note  I connected with Monique Tyler on 11/06/18 at  9:00 AM EDT by telephone and verified that I am speaking with the correct person using two identifiers.   I discussed the limitations, risks, security and privacy concerns of performing an evaluation and management service by telephone and the availability of in person appointments. I also discussed with the patient that there may be a patient responsible charge related to this service. The patient expressed understanding and agreed to proceed.   History of Present Illness: This is a 53 year old female with previous history of asthma and sleep apnea.  At the last visit with me in March we determined the patient was tolerating CPAP well and was on appropriate settings.  The patient states that since that time she was exposed at work to a patient who is being tested for COVID.  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  This occurred a week ago.  She has been in self quarantine since that time.  The test result will be coming out today and she will let us know the result of this.  She states she has no fever or cough there is no GI symptoms there is no sore throat.  She does have heaviness in the chest with chest tightness.  She denies any wheezing.  She did have her ear wax removed and her hearing is better.  She notes some fatigue.  She does have a nebulizer machine and is requesting albuterol to go in the nebulizer.  She does have handheld albuterol.  We went over the technique with the albuterol was clear she was not timing current inhalations properly.  She states the use of her CPAP machine is been difficult due to her breathing She is maintaining Zyrtec and Flonase.  All of her medications are reviewed and there are no refills needed. Pos in BOLD  Constitutional:    weight loss, night sweats,  Fevers,  chills, fatigue, lassitude. HEENT:   No headaches,  Difficulty swallowing,  Tooth/dental problems,  Sore throat,                 sneezing, itching, ear ache, nasal congestion, post nasal drip,   CV:  No chest pain,  Orthopnea, PND, swelling in lower extremities, anasarca, dizziness, palpitations  GI  No heartburn, indigestion, abdominal pain, nausea, vomiting, diarrhea, change in bowel habits, loss of appetite  Resp: Hello shortness of breath with exertion or at rest.  No excess mucus, no productive cough,  No non-productive cough,  No coughing up of blood.  No change in color of mucus.  No wheezing.  No chest wall deformity  Skin: no rash or lesions.  GU: no dysuria, change in color of urine, no urgency or frequency.  No flank pain.  MS:  No joint pain or swelling.  No decreased range of motion.  No back pain.  Psych:  No change in mood or affect. No depression or anxiety.  No memory loss.  Observations/Objective:  Observations this was a telephone visit  Assessment and Plan: #1 obstructive sleep apnea stable at this time: The patient will continue her sleep apnea machine CPAP set at 10 cm water pressure  #2 history of asthma and now on a self quarantine from COVID exposure.  Patient's symptom complex currently is not consistent with an active COVID infection.  The patient will continue to self monitor.  I did refill the albuterol inhaler by way of her nebulizer machine.    Follow Up Instructions: The patient will have a follow-up visit in 6 to 8 weeks   I discussed the assessment and treatment plan with the patient. The patient was provided an opportunity to ask questions and all were answered. The patient agreed with the plan and demonstrated an understanding of the instructions.   The patient was advised to call back or seek an in-person evaluation if the symptoms worsen or if the condition fails to improve as anticipated.  I provided 25 minutes of non-face-to-face time during  this encounter.   Shan Levans, MD

## 2018-11-19 ENCOUNTER — Telehealth: Payer: Self-pay | Admitting: Family Medicine

## 2018-11-19 MED FILL — ALBUTEROL SUL 2.5 MG/3 ML S: (2.5 MG/3ML | 6 days supply | Qty: 75 | Fill #1

## 2018-11-19 MED FILL — TOPIRAMATE 100 MG TABS: 100 | 15 days supply | Qty: 46 | Fill #3

## 2018-11-19 MED FILL — FLUoxetine HCL 20 MG CAPS: 20 | 30 days supply | Qty: 30 | Fill #3

## 2018-11-19 NOTE — Telephone Encounter (Signed)
When patient was called back, she reports:  Chest pain located in the mid chest, radiating to back x 2 days. Nebulizer used at 8 a.m. gave some relief. Pain improves and  changes with movement but unable to take in deep breath.  Uses CPAP HS  She admits she had diarrhea on yesterday. She has also been shopping with her son who is moving to an apartment.  Test results from recent COVID exposure from job came back negative.   Patient was advised to go to ED d/t pain 6/10 in chest.   Please advise otherwise.

## 2018-11-19 NOTE — Telephone Encounter (Signed)
I agree with recommendations.  Thank you!

## 2018-11-19 NOTE — Telephone Encounter (Signed)
Pt called in stating she was having chest pains advised to go to ER

## 2018-12-25 ENCOUNTER — Other Ambulatory Visit: Payer: Self-pay | Admitting: Family Medicine

## 2018-12-25 DIAGNOSIS — G43709 Chronic migraine without aura, not intractable, without status migrainosus: Secondary | ICD-10-CM

## 2018-12-25 MED FILL — FLUoxetine HCL 20 MG CAPS: 20 | 30 days supply | Qty: 30 | Fill #4

## 2018-12-25 MED FILL — !VENTOLIN HFA INHALER: 108 (90 BAS | 16 days supply | Qty: 18 | Fill #1

## 2018-12-26 MED FILL — TOPIRAMATE 100 MG TABLET: 100 | 30 days supply | Qty: 90 | Fill #0

## 2019-01-28 ENCOUNTER — Telehealth: Payer: Self-pay | Admitting: Family Medicine

## 2019-01-28 NOTE — Telephone Encounter (Signed)
[name] with [employer] is calling in regards to paperwork that was faxed over. States that it is for a life insurance policy that they are writing for the patient. Please follow up.

## 2019-01-30 NOTE — Telephone Encounter (Signed)
[name] was called and informed to refax paperwork.

## 2019-02-26 ENCOUNTER — Other Ambulatory Visit: Payer: Self-pay | Admitting: Family Medicine

## 2019-02-26 DIAGNOSIS — G43709 Chronic migraine without aura, not intractable, without status migrainosus: Secondary | ICD-10-CM

## 2019-02-26 MED FILL — FLUoxetine HCL 20 MG CAPS: 20 | 30 days supply | Qty: 30 | Fill #0

## 2019-02-26 MED FILL — TOPIRAMATE 100 MG TABLET: 100 | 30 days supply | Qty: 90 | Fill #0

## 2019-03-27 ENCOUNTER — Other Ambulatory Visit: Payer: Self-pay | Admitting: Family Medicine

## 2019-03-27 DIAGNOSIS — G43709 Chronic migraine without aura, not intractable, without status migrainosus: Secondary | ICD-10-CM

## 2019-03-28 ENCOUNTER — Other Ambulatory Visit: Payer: Self-pay | Admitting: Internal Medicine

## 2019-03-28 ENCOUNTER — Telehealth: Payer: Self-pay | Admitting: Family Medicine

## 2019-03-28 ENCOUNTER — Telehealth: Payer: Self-pay

## 2019-03-28 ENCOUNTER — Other Ambulatory Visit: Payer: Self-pay

## 2019-03-28 DIAGNOSIS — G43709 Chronic migraine without aura, not intractable, without status migrainosus: Secondary | ICD-10-CM

## 2019-03-28 MED ORDER — FLUOXETINE HCL 20 MG PO CAPS: 20.0000 mg | ORAL_CAPSULE | Freq: Every day | ORAL | 1 refills | Status: DC

## 2019-03-28 MED ORDER — TOPIRAMATE 100 MG PO TABS: ORAL_TABLET | ORAL | 3 refills | Status: DC

## 2019-03-28 MED FILL — TOPIRAMATE 100 MG TABLET: 100 | 30 days supply | Qty: 90 | Fill #0

## 2019-03-28 MED FILL — FLUoxetine HCL 20 MG CAPS: 20 | 30 days supply | Qty: 30 | Fill #0

## 2019-03-28 NOTE — Telephone Encounter (Signed)
CMA routed to PCP.

## 2019-03-28 NOTE — Telephone Encounter (Signed)
Will route to PCP for review. 

## 2019-03-28 NOTE — Telephone Encounter (Signed)
Patient is requesting refill on prozac medication sent to onsite pharmacy.

## 2019-03-28 NOTE — Telephone Encounter (Signed)
1) Medication(s) Requested (by name): topiramate (TOPAMAX) 100 MG tablet  prozac 2) Pharmacy of Choice: [facility] 3) Special Requests:   Approved medications will be sent to the pharmacy, we will reach out if there is an issue.  Requests made after 3pm may not be addressed until the following business day!  If a patient is unsure of the name of the medication(s) please note and ask patient to call back when they are able to provide all info, do not send to responsible party until all information is available!

## 2019-03-28 NOTE — Addendum Note (Signed)
Addended by: Charlott Rakes on: 03/28/2019 01:53 PM   Modules accepted: Orders

## 2019-03-28 NOTE — Telephone Encounter (Signed)
Refilled

## 2019-03-28 NOTE — Telephone Encounter (Signed)
Patient has been called and informed of medication being sent to pharmacy. 

## 2019-04-03 ENCOUNTER — Ambulatory Visit: Payer: Self-pay | Attending: Family Medicine | Admitting: Physician Assistant

## 2019-04-03 ENCOUNTER — Other Ambulatory Visit: Payer: Self-pay

## 2019-04-03 ENCOUNTER — Ambulatory Visit: Payer: Self-pay

## 2019-04-03 DIAGNOSIS — J3089 Other allergic rhinitis: Secondary | ICD-10-CM

## 2019-04-03 DIAGNOSIS — G4733 Obstructive sleep apnea (adult) (pediatric): Secondary | ICD-10-CM

## 2019-04-03 DIAGNOSIS — G43709 Chronic migraine without aura, not intractable, without status migrainosus: Secondary | ICD-10-CM

## 2019-04-03 DIAGNOSIS — F419 Anxiety disorder, unspecified: Secondary | ICD-10-CM

## 2019-04-03 MED ORDER — HYDROXYZINE HCL 25 MG PO TABS: 25.0000 mg | ORAL_TABLET | Freq: Three times a day (TID) | ORAL | 1 refills | Status: DC | PRN

## 2019-04-03 MED ORDER — ALBUTEROL SULFATE HFA 108 (90 BASE) MCG/ACT IN AERS: 2.0000 | INHALATION_SPRAY | RESPIRATORY_TRACT | 1 refills | Status: DC | PRN

## 2019-04-03 MED ORDER — FLUOXETINE HCL 20 MG PO CAPS: 20.0000 mg | ORAL_CAPSULE | Freq: Every day | ORAL | 3 refills | Status: DC

## 2019-04-03 MED ORDER — TOPIRAMATE 100 MG PO TABS: ORAL_TABLET | ORAL | 3 refills | Status: DC

## 2019-04-03 MED FILL — !VENTOLIN HFA INHALER: 108 (90 BAS | 16 days supply | Qty: 18 | Fill #0

## 2019-04-03 MED FILL — hydrOXYzine HCL 25 MG TABS: 25 | 24 days supply | Qty: 120 | Fill #0

## 2019-04-03 NOTE — Progress Notes (Signed)
Virtual Visit via Telephone Note  I connected with [name] on 04/03/19 at  3:50 PM EDT by telephone and verified that I am speaking with the correct person using two identifiers.   I discussed the limitations, risks, security and privacy concerns of performing an evaluation and management service by telephone and the availability of in person appointments. I also discussed with the patient that there may be a patient responsible charge related to this service. The patient expressed understanding and agreed to proceed.  Patient location:  home My Location:  [facility] office Persons on the call: me and the patient  History of Present Illness: patient needs RF on meds. She lost her job due to [employer]. Problems with CPAP machine tubing and is unable to afford replacing the tubing.  shes been feeling more tired bc not sleeping as well w/o her CPAP machine.  Denies any other issues or complaints.      Observations/Objective:  NAD.  A&Ox3   Assessment and Plan: 1. Seasonal allergic rhinitis due to other allergic trigger - albuterol (VENTOLIN HFA) 108 (90 Base) MCG/ACT inhaler; Inhale 2 puffs into the lungs every 4 (four) hours as needed for wheezing or shortness of breath.  Dispense: 18 g; Refill: 1  2. Chronic migraine without aura without status migrainosus, not intractable - Lipid panel - topiramate (TOPAMAX) 100 MG tablet; 2 tabs in the morning and 1 tab in the evening orally.  Dispense: 90 tablet; Refill: 3  3. Anxiety Exacerbated by job loss/financial stress but stable and no SI/HI.   - hydrOXYzine (ATARAX/VISTARIL) 25 MG tablet; Take 1 tablet (25 mg total) by mouth 3 (three) times daily as needed (Increase night time dose 50 mg).  Dispense: 120 tablet; Refill: 1 - FLUoxetine (PROZAC) 20 MG capsule; Take 1 capsule (20 mg total) by mouth daily.  Dispense: 30 capsule; Refill: 3  4. Obstructive sleep apnea - Comprehensive metabolic panel - CBC with Differential/Platelet; Future - Lipid  panel - Ambulatory referral to Social Work    Follow Up Instructions: See PCP in 81months   I discussed the assessment and treatment plan with the patient. The patient was provided an opportunity to ask questions and all were answered. The patient agreed with the plan and demonstrated an understanding of the instructions.   The patient was advised to call back or seek an in-person evaluation if the symptoms worsen or if the condition fails to improve as anticipated.  I provided 15 minutes of non-face-to-face time during this encounter.   Monique Caldron, PA-C  Patient ID: Monique Tyler, female   DOB: 10/26/1965, 53 y.o.   MRN: [id number]

## 2019-04-04 LAB — CBC WITH DIFFERENTIAL/PLATELET
Basophils Absolute: 0 x10E3/uL (ref 0.0–0.2)
Basos: 1 %
EOS (ABSOLUTE): 0.1 x10E3/uL (ref 0.0–0.4)
Eos: 2 %
Hematocrit: 40.8 % (ref 34.0–46.6)
Hemoglobin: 12.9 g/dL (ref 11.1–15.9)
Immature Grans (Abs): 0 x10E3/uL (ref 0.0–0.1)
Immature Granulocytes: 0 %
Lymphocytes Absolute: 1.9 x10E3/uL (ref 0.7–3.1)
Lymphs: 38 %
MCH: 26.7 pg (ref 26.6–33.0)
MCHC: 31.6 g/dL (ref 31.5–35.7)
MCV: 85 fL (ref 79–97)
Monocytes Absolute: 0.5 x10E3/uL (ref 0.1–0.9)
Monocytes: 10 %
Neutrophils Absolute: 2.4 x10E3/uL (ref 1.4–7.0)
Neutrophils: 49 %
Platelets: 279 x10E3/uL (ref 150–450)
RBC: 4.83 x10E6/uL (ref 3.77–5.28)
RDW: 12.8 % (ref 11.7–15.4)
WBC: 5 x10E3/uL (ref 3.4–10.8)

## 2019-04-18 ENCOUNTER — Telehealth: Payer: Self-pay | Admitting: Licensed Clinical Social Worker

## 2019-04-18 NOTE — Telephone Encounter (Signed)
Call placed to patient. LCSW introduced self and explained role at [facility]. Pt was informed of referral to address anxiety and the need for CPAP supplies.   Pt shared that she has experienced an increase in anxiety triggered by the loss of her job in March 2020, in addition, to decreased quality of sleep. Pt shared that since the loss of her insurance, she has been unable to obtain a new mask and tubing. Denies SI/HI  LCSW provided validation and encouragement. Pt was successful in identifying healthy coping skills (praying, singing, spending time with grandchildren) Pt participates in medication management and reports slight decrease in symptoms.   LCSW will consult with RN CM, Eden Lathe regarding assistance with CPAP supplies. Will follow up with patient next week.

## 2019-04-21 ENCOUNTER — Telehealth: Payer: Self-pay

## 2019-04-21 NOTE — Telephone Encounter (Signed)
Call placed to patient regarding her CPAP supplies.  She explained that she is in need of new CPAP tubing and mask.  She said that she lost her insurance recently and called [employer] [facility] to inquire about ordering the supplies. They told her to call back when she has insurance and hung up. She was never informed of the cost of what she needs. Told her that this CM would check with [employer] about the cost and also local company about hardship program .   Call placed to [employer], spoke to [place] in outpatient sales. As per Bevelyn Ngo with the CPAP resupply team, they are not able to help with providing costs because the patient has no insurance. Per Jeneen Rinks, all costs are approximate: tubing $60; filters $10; headgear $25; masks $100-200. He could not find any specific information about the mask that the patient was given.  He explained that [facility] [employer] bought out [place] 09/2018 and they do not have any notes about a mask.  Call placed to [place] regarding hardship program. Message left with call back requested to this CM # [phone].

## 2019-04-22 NOTE — Telephone Encounter (Signed)
Call received from [place] office hardship program. She explained that they will consider patient for their hardship program for her CPAP supplies even though her CPAP was not provided by [facility].  Diane explained that they need a copy of the sleep study and a prescription for the supplies as well as documentation stating that the patient is using the CPAP on a regular basis and it has been helping her.  They will then send her a copy of the hardship application to complete and return.  They will review and  determine if she is eligible for the program.  Fax order and requested documentation to # [phone] with notation to send information to corporate office attention Diane/hardship program.   Call placed to patient and informed her of calls above noted information as well as information from [employer] [facility].  She is in agreement to submitting information to [place] for the hardship program. She was not at home at the time of the call and will check her CPAP mask for product name/number/size and call back with this information.

## 2019-04-30 MED FILL — TOPIRAMATE 100 MG TABLET: 100 | 30 days supply | Qty: 90 | Fill #1

## 2019-04-30 MED FILL — FLUoxetine HCL 20 MG CAPS: 20 | 30 days supply | Qty: 30 | Fill #1

## 2019-05-09 NOTE — Telephone Encounter (Signed)
Call placed to patient as she has not called back with CPAP mask information.  She said that she has not been able to find the packaging and there is no identifying information on the mask. Informed her that an order can be placed for the large size  full face mask and she was in agreement. Informed her that Dr Margarita Rana would be notified.

## 2019-05-12 MED ORDER — MISC. DEVICES MISC: 0 refills | Status: DC

## 2019-05-12 NOTE — Telephone Encounter (Signed)
Done

## 2019-05-14 NOTE — Telephone Encounter (Signed)
Order for CPAP mask faxed to [facility]

## 2019-05-30 MED FILL — FLUoxetine HCL 20 MG CAPS: 20 | 30 days supply | Qty: 30 | Fill #0

## 2019-05-30 MED FILL — hydrOXYzine HCL 25 MG TABS: 25 | 24 days supply | Qty: 120 | Fill #1

## 2019-05-30 MED FILL — TOPIRAMATE 100 MG TABLET: 100 | 30 days supply | Qty: 90 | Fill #2

## 2019-06-10 ENCOUNTER — Telehealth: Payer: Self-pay

## 2019-06-10 NOTE — Telephone Encounter (Signed)
Call placed to [facility] now [employer] [facility] to check status of order for CPAP mask. Spoke to [place] who stated that the order has been received and they will need to contact the patient about the hardship program.

## 2019-06-17 ENCOUNTER — Telehealth: Payer: Self-pay

## 2019-06-17 NOTE — Telephone Encounter (Signed)
Attempted to contact the patient to inform her that [facility] needs additional information for the CPAP mask order.  Will also discuss the CPAP mask program through the [employer]. Message left with call back requested to this CM

## 2019-06-18 ENCOUNTER — Telehealth: Payer: Self-pay

## 2019-06-18 NOTE — Telephone Encounter (Signed)
Attempted again to contact the patient # [phone], to discuss options for CPAP mask.  Message left with call back requested to this CM

## 2019-06-23 NOTE — Telephone Encounter (Signed)
Call received from patient.  Explained that the hardship program with [facility] is requesting more documentation from Dr Margarita Rana regarding the CPAP mask.  Dr Margarita Rana will need to do a televisit with her to discuss the CPAP usage.  This CM also explained that there is no guarantee that she will be approved for the program and if she is approved it may not be for 100%.  Another option is the [employer] CPAP mask program. Masks are $25 with an order from her provider.  The patient said that she is not able to afford $25 at this time and she would like to pursue the hardship application.   She was scheduled for a televisit with Dr Margarita Rana tomorrow - 06/24/2019 @ 1510.

## 2019-06-24 ENCOUNTER — Ambulatory Visit: Payer: Self-pay | Admitting: Family Medicine

## 2019-06-30 MED FILL — TOPIRAMATE 100 MG TABS: 100 | 30 days supply | Qty: 90 | Fill #3

## 2019-06-30 MED FILL — FLUoxetine HCL 20 MG CAPS: 20 | 30 days supply | Qty: 30 | Fill #1

## 2019-07-01 ENCOUNTER — Telehealth: Payer: Self-pay

## 2019-07-01 ENCOUNTER — Other Ambulatory Visit: Payer: Self-pay

## 2019-07-01 ENCOUNTER — Encounter: Payer: Self-pay | Admitting: Family Medicine

## 2019-07-01 ENCOUNTER — Ambulatory Visit: Payer: Self-pay | Attending: Family Medicine | Admitting: Family Medicine

## 2019-07-01 DIAGNOSIS — G43709 Chronic migraine without aura, not intractable, without status migrainosus: Secondary | ICD-10-CM

## 2019-07-01 DIAGNOSIS — F419 Anxiety disorder, unspecified: Secondary | ICD-10-CM

## 2019-07-01 DIAGNOSIS — G4733 Obstructive sleep apnea (adult) (pediatric): Secondary | ICD-10-CM

## 2019-07-01 MED ORDER — HYDROXYZINE HCL 25 MG PO TABS: 25.0000 mg | ORAL_TABLET | Freq: Three times a day (TID) | ORAL | 6 refills | Status: DC | PRN

## 2019-07-01 MED ORDER — TOPIRAMATE 100 MG PO TABS: ORAL_TABLET | ORAL | 6 refills | Status: DC

## 2019-07-01 MED ORDER — MISC. DEVICES MISC: 0 refills | Status: DC

## 2019-07-01 MED ORDER — FLUOXETINE HCL 20 MG PO CAPS: 20.0000 mg | ORAL_CAPSULE | Freq: Every day | ORAL | 6 refills | Status: DC

## 2019-07-01 NOTE — Progress Notes (Signed)
Patient has been called and DOB has been verified. Patient has been screened and transferred to PCP to start phone visit.     

## 2019-07-01 NOTE — Progress Notes (Signed)
Virtual Visit via Telephone Note  I connected with Monique Tyler, on 07/01/2019 at 9:48 AM by telephone due to the COVID-19 pandemic and verified that I am speaking with the correct person using two identifiers.   Consent: I discussed the limitations, risks, security and privacy concerns of performing an evaluation and management service by telephone and the availability of in person appointments. I also discussed with the patient that there may be a patient responsible charge related to this service. The patient expressed understanding and agreed to proceed.   Location of Patient: Home  Location of Provider: Clinic   Persons participating in Telemedicine visit: Tracye Donika Butner Farrington-CMA Dr. Alvis Lemmings     History of Present Illness: Monique Tyler  is a 53 year old right handed woman with a history of chronic migraines, anxiety, right shoulder pain status post rotator cuff surgery in 07/2016, seasonal allergies, OSA.  She has been out of work for 9 months due to the ongoing pandemic and has lost her medical coverage as a result.  She used her CPAP nightly and was benefiting from but now tneeds a new mask and since she is out of work hence has been unable to afford it. The one she had she used for 6 months Now she is not sleeping well, is very tired due to not using it. Her husband wakes her up in the middle of the night to turn over as she has not breathing well.  She is compliant with her medication for anxiety and is needing refill; also need refills of Topamax for her migraines.  Past Medical History:  Diagnosis Date  . GERD (gastroesophageal reflux disease)   . Hyperlipidemia   . Hypertension    pt denies a hx of HTN  . Kidney stones   . Lung nodules   . Migraine   . Vertigo    Allergies  Allergen Reactions  . Shellfish Allergy Swelling  . Pork-Derived Products Nausea And Vomiting    Current Outpatient Medications on File Prior to Visit  Medication Sig  Dispense Refill  . albuterol (PROVENTIL) (2.5 MG/3ML) 0.083% nebulizer solution Take 3 mLs (2.5 mg total) by nebulization every 6 (six) hours as needed for wheezing or shortness of breath. 75 mL 12  . albuterol (VENTOLIN HFA) 108 (90 Base) MCG/ACT inhaler Inhale 2 puffs into the lungs every 4 (four) hours as needed for wheezing or shortness of breath. 18 g 1  . cetirizine (ZYRTEC) 10 MG tablet Take 1 tablet (10 mg total) by mouth daily. 30 tablet 6  . Cholecalciferol (VITAMIN D) 2000 units CAPS Take 2,000 Units by mouth daily.    Marland Kitchen FLUoxetine (PROZAC) 20 MG capsule Take 1 capsule (20 mg total) by mouth daily. 30 capsule 3  . fluticasone (FLONASE) 50 MCG/ACT nasal spray Place 2 sprays into both nostrils daily. 16 g 6  . hydrOXYzine (ATARAX/VISTARIL) 25 MG tablet Take 1 tablet (25 mg total) by mouth 3 (three) times daily as needed (Increase night time dose 50 mg). 120 tablet 1  . methocarbamol (ROBAXIN) 500 MG tablet Take 1 tablet (500 mg total) by mouth every 8 (eight) hours as needed for muscle spasms. 60 tablet 2  . Misc. Devices MISC Home Auto titration. Dx: Obstructive sleep apnea 1 each 0  . Misc. Devices MISC Large size full face mask. Dx: Sleep apnea 1 each 0  . ondansetron (ZOFRAN ODT) 4 MG disintegrating tablet Take 1 tablet (4 mg total) by mouth every 8 (eight) hours as needed for nausea  or vomiting. 20 tablet 0  . topiramate (TOPAMAX) 100 MG tablet 2 tabs in the morning and 1 tab in the evening orally. 90 tablet 3  . traMADol (ULTRAM) 50 MG tablet Take 1 tablet (50 mg total) by mouth every 6 (six) hours as needed. 15 tablet 0   No current facility-administered medications on file prior to visit.     Observations/Objective: Awake, alert, oriented x3 Not in acute distress  Assessment and Plan: 1. Chronic migraine without aura without status migrainosus, not intractable Controlled - topiramate (TOPAMAX) 100 MG tablet; 2 tabs in the morning and 1 tab in the evening orally.  Dispense:  90 tablet; Refill: 6  2. Anxiety Stable - hydrOXYzine (ATARAX/VISTARIL) 25 MG tablet; Take 1 tablet (25 mg total) by mouth 3 (three) times daily as needed (Increase night time dose 50 mg).  Dispense: 120 tablet; Refill: 6 - FLUoxetine (PROZAC) 20 MG capsule; Take 1 capsule (20 mg total) by mouth daily.  Dispense: 30 capsule; Refill: 6  3. Obstructive sleep apnea Uncontrolled due to lateral mask We will [facility] over [facility] to [facility] and attempt to obtain a mask for her. - Misc. Devices MISC; Large size full face mask. Dx: Sleep apnea  Dispense: 1 each; Refill: 0   HCM-needs colonoscopy and mammogram; advised to apply for the [facility] financial discount to facilitate this process.  Follow Up Instructions: 6 months   I discussed the assessment and treatment plan with the patient. The patient was provided an opportunity to ask questions and all were answered. The patient agreed with the plan and demonstrated an understanding of the instructions.   The patient was advised to call back or seek an in-person evaluation if the symptoms worsen or if the condition fails to improve as anticipated.     I provided 15 minutes total of non-face-to-face time during this encounter including median intraservice time, reviewing previous notes, labs, imaging, medications, management and patient verbalized understanding.     Charlott Rakes, MD, FAAFP. [facility] [place], [place]   07/01/2019, 9:48 AM

## 2019-07-01 NOTE — Telephone Encounter (Signed)
Supporting documentation for CPAP mask  faxed to [place] with notation that patient was referred to the hardship program

## 2019-07-29 MED FILL — FLUoxetine HCL 20 MG CAPS: 20 | 30 days supply | Qty: 30 | Fill #2

## 2019-07-30 ENCOUNTER — Telehealth: Payer: Self-pay

## 2019-07-30 NOTE — Telephone Encounter (Signed)
Message received from [facility] [employer] [facility] stating that the patient needs to call them with information # [phone].   Call placed to patient and provided her with the phone number to call [facility]

## 2019-08-28 ENCOUNTER — Other Ambulatory Visit: Payer: Self-pay | Admitting: Family Medicine

## 2019-08-28 DIAGNOSIS — G43709 Chronic migraine without aura, not intractable, without status migrainosus: Secondary | ICD-10-CM

## 2019-08-28 MED FILL — FLUoxetine HCL 20 MG CAPS: 20 | 30 days supply | Qty: 30 | Fill #3

## 2019-08-28 MED FILL — TOPIRAMATE 100 MG TABS: 100 | 30 days supply | Qty: 90 | Fill #0

## 2019-09-01 ENCOUNTER — Telehealth: Payer: Self-pay | Admitting: Family Medicine

## 2019-09-01 NOTE — Telephone Encounter (Signed)
Able to connect with patient. Patient name and DOB verified.Patient states that her heart has been pounding hard and racing for several days. Feels uncomfortable, worse with lying and movement. Having fatigue and unable to sleep. Does not wear CPAP HS.  Sx's ongoing for a couple of days. Denies SOB or chest pain.   Advised patient to f/u in UC or ED for further evaluation with EKG and lab test. Patient verbalized understanding.

## 2019-09-01 NOTE — Telephone Encounter (Signed)
Please call and assess.  Tell patient to go to the ED if she is having heart issues.  Thanks, Georgian Co, PA-C

## 2019-09-01 NOTE — Telephone Encounter (Signed)
Patient called requesting to speak with the nurse because she states that over the pat few days she's been having an increase in heart palpitations. Please f/u

## 2019-09-02 ENCOUNTER — Ambulatory Visit (HOSPITAL_COMMUNITY)
Admission: EM | Admit: 2019-09-02 | Discharge: 2019-09-02 | Disposition: A | Discharge status: Home / Self Care | Payer: Self-pay | Attending: Emergency Medicine | Admitting: Emergency Medicine

## 2019-09-02 ENCOUNTER — Other Ambulatory Visit: Payer: Self-pay

## 2019-09-02 ENCOUNTER — Ambulatory Visit (INDEPENDENT_AMBULATORY_CARE_PROVIDER_SITE_OTHER): Payer: Self-pay

## 2019-09-02 ENCOUNTER — Encounter (HOSPITAL_COMMUNITY): Payer: Self-pay

## 2019-09-02 DIAGNOSIS — R0789 Other chest pain: Secondary | ICD-10-CM | POA: Insufficient documentation

## 2019-09-02 DIAGNOSIS — R002 Palpitations: Secondary | ICD-10-CM | POA: Insufficient documentation

## 2019-09-02 DIAGNOSIS — F419 Anxiety disorder, unspecified: Secondary | ICD-10-CM | POA: Insufficient documentation

## 2019-09-02 LAB — CBC
HCT: 42.5 % (ref 36.0–46.0)
Hemoglobin: 13 g/dL (ref 12.0–15.0)
MCH: 27 pg (ref 26.0–34.0)
MCHC: 30.6 g/dL (ref 30.0–36.0)
MCV: 88.2 fL (ref 80.0–100.0)
Platelets: 286 10*3/uL (ref 150–400)
RBC: 4.82 MIL/uL (ref 3.87–5.11)
RDW: 13.2 % (ref 11.5–15.5)
WBC: 4.6 10*3/uL (ref 4.0–10.5)
nRBC: 0 % (ref 0.0–0.2)

## 2019-09-02 LAB — BASIC METABOLIC PANEL
Anion gap: 8 (ref 5–15)
BUN: 10 mg/dL (ref 6–20)
CO2: 23 mmol/L (ref 22–32)
Calcium: 9.5 mg/dL (ref 8.9–10.3)
Chloride: 109 mmol/L (ref 98–111)
Creatinine, Ser: 0.93 mg/dL (ref 0.44–1.00)
GFR calc Af Amer: >60 mL/min (ref >60)
GFR calc non Af Amer: >60 mL/min (ref >60)
Glucose, Bld: 94 mg/dL (ref 70–99)
Potassium: 4.1 mmol/L (ref 3.5–5.1)
Sodium: 140 mmol/L (ref 135–145)

## 2019-09-02 LAB — TSH: TSH: 1.762 u[IU]/mL (ref 0.350–4.500)

## 2019-09-02 MED ORDER — HYDROXYZINE HCL 25 MG PO TABS: 25.0000 mg | ORAL_TABLET | Freq: Three times a day (TID) | ORAL | 6 refills | Status: DC | PRN

## 2019-09-02 MED FILL — hydrOXYzine HCL 25 MG TABS: 25 | 30 days supply | Qty: 120 | Fill #0

## 2019-09-02 NOTE — Discharge Instructions (Signed)
EKG normal Chest xray normal Blood work pending- I will call if abnormal Please continue anxiety medicine as prescribed Follow-up with cardiology outpatient if continuing to have palpitations, limit caffeine and sodas  Follow-up with primary care  If developing more pain, difficulty breathing please follow-up in the emergency room

## 2019-09-02 NOTE — ED Provider Notes (Signed)
[facility]    CSN: [id number] Arrival date & time: 09/02/19  1209      History   Chief Complaint Chief Complaint  Patient presents with  . Chest Pain    HPI Monique Tyler is a 54 y.o. female history of GERD, hypertension, hyperlipidemia, presenting today for evaluation of chest discomfort.  Patient states that over the past 3 days she has had worsening heart palpitations with sensations of her heart skipping a beat as well as a very prominent pounding heartbeat.  She notices this worse with lying flat.  Does report some shortness of breath with exertion, but denies any associated chest pain.  Denies any nausea or vomiting.  Eating and drinking normally.  Denies any cough or recent URI symptoms.  She does report some occasional dizziness and nausea, but this is intermittent.  She also reports significant fatigue of recently.  She has not had a CPAP mask for approximately 6 months as this is needing to be replaced due to normal wear and tear.  She is working with community health and wellness to have this replaced.  She feels as if she has been sleeping more.  She reports 1 cup of coffee daily in the morning along with approximately 3 Pepsi's per day.  Does feel palpitations are worse with eating sweeter foods such as oranges.  Denies history of diabetes.  Denies tobacco use.  Denies prior DVT/PE.  Denies leg pain or leg swelling.  Denies recent travel.  Denies exogenous estrogen use.  Does report history of anxiety. Occasional burning sensation, denies currently.  HPI  Past Medical History:  Diagnosis Date  . GERD (gastroesophageal reflux disease)   . Hyperlipidemia   . Hypertension    pt denies a hx of HTN  . Kidney stones   . Lung nodules   . Migraine   . Vertigo     Patient Active Problem List   Diagnosis Date Noted  . Mediastinal adenopathy 07/05/2018  . Obstructive sleep apnea 06/14/2018  . Anxiety 03/15/2018  . Status post right rotator cuff repair 02/28/2017  .  Seasonal allergies 02/28/2017  . Migraine 11/30/2015  . Benign paroxysmal positional vertigo 11/30/2015  . Avitaminosis D 11/28/2011    Past Surgical History:  Procedure Laterality Date  . CESAREAN SECTION    . MANDIBLE SURGERY    . TUBAL LIGATION      OB History    Gravida  5   Para  5   Term      Preterm      AB      Living        SAB      TAB      Ectopic      Multiple  1   Live Births               Home Medications    Prior to Admission medications   Medication Sig Start Date End Date Taking? Authorizing Provider  albuterol (PROVENTIL) (2.5 MG/3ML) 0.083% nebulizer solution Take 3 mLs (2.5 mg total) by nebulization every 6 (six) hours as needed for wheezing or shortness of breath. 11/06/18 11/06/19  Storm Frisk, MD  albuterol (VENTOLIN HFA) 108 (90 Base) MCG/ACT inhaler Inhale 2 puffs into the lungs every 4 (four) hours as needed for wheezing or shortness of breath. 04/03/19   Anders Simmonds, PA-C  cetirizine (ZYRTEC) 10 MG tablet Take 1 tablet (10 mg total) by mouth daily. 06/13/18   Hoy Register, MD  Cholecalciferol (VITAMIN D) 2000 units CAPS Take 2,000 Units by mouth daily.    [provider]  FLUoxetine (PROZAC) 20 MG capsule Take 1 capsule (20 mg total) by mouth daily. 07/01/19   Charlott Rakes, MD  fluticasone (FLONASE) 50 MCG/ACT nasal spray Place 2 sprays into both nostrils daily. 06/13/18   Charlott Rakes, MD  hydrOXYzine (ATARAX/VISTARIL) 25 MG tablet Take 1 tablet (25 mg total) by mouth 3 (three) times daily as needed (Increase night time dose 50 mg). 07/01/19   Charlott Rakes, MD  methocarbamol (ROBAXIN) 500 MG tablet Take 1 tablet (500 mg total) by mouth every 8 (eight) hours as needed for muscle spasms. 02/14/18   Argentina Donovan, PA-C  Misc. Devices MISC Home Auto titration. Dx: Obstructive sleep apnea 06/14/18   Charlott Rakes, MD  Misc. Devices MISC Large size full face mask. Dx: Sleep apnea 07/01/19   Charlott Rakes,  MD  ondansetron (ZOFRAN ODT) 4 MG disintegrating tablet Take 1 tablet (4 mg total) by mouth every 8 (eight) hours as needed for nausea or vomiting. 02/14/18   Argentina Donovan, PA-C  topiramate (TOPAMAX) 100 MG tablet TAKE 2 TABLETS BY MOUTH IN THE MORNING AND THEN TAKE 1 TABLET IN THE EVENING 08/29/19   Charlott Rakes, MD    Family History Family History  Problem Relation Age of Onset  . Hypertension Daughter   . Hypertension Son     Social History Social History   Tobacco Use  . Smoking status: Former Smoker    Types: Cigarettes  . Smokeless tobacco: Never Used  Substance Use Topics  . Alcohol use: No    Alcohol/week: 0.0 standard drinks  . Drug use: No     Allergies   Shellfish allergy and Pork-derived products   Review of Systems Review of Systems  Constitutional: Negative for fatigue and fever.  HENT: Negative for congestion, sinus pressure and sore throat.   Eyes: Negative for photophobia, pain and visual disturbance.  Respiratory: Negative for cough and shortness of breath.   Cardiovascular: Positive for chest pain and palpitations. Negative for leg swelling.  Gastrointestinal: Positive for nausea. Negative for abdominal pain and vomiting.  Genitourinary: Negative for decreased urine volume and hematuria.  Musculoskeletal: Negative for myalgias, neck pain and neck stiffness.  Neurological: Positive for dizziness. Negative for syncope, facial asymmetry, speech difficulty, weakness, light-headedness, numbness and headaches.     Physical Exam Triage Vital Signs ED Triage Vitals  Enc Vitals Group     BP 09/02/19 1224 (!) 141/81     Pulse Rate 09/02/19 1224 84     Resp 09/02/19 1224 18     Temp 09/02/19 1224 98.4 F (36.9 C)     Temp Source 09/02/19 1224 Oral     SpO2 09/02/19 1224 100 %     Weight 09/02/19 1223 230 lb (104.3 kg)     Height --      Head Circumference --      Peak Flow --      Pain Score 09/02/19 1222 7     Pain Loc --      Pain Edu? --        Excl. in [place]? --    No data found.  Updated Vital Signs BP (!) 141/81 (BP Location: Right Arm)   Pulse 84   Temp 98.4 F (36.9 C) (Oral)   Resp 18   Wt 230 lb (104.3 kg)   LMP 01/18/2012   SpO2 100%   BMI 36.02 kg/m  Visual Acuity Right Eye Distance:   Left Eye Distance:   Bilateral Distance:    Right Eye Near:   Left Eye Near:    Bilateral Near:     Physical Exam Vitals and nursing note reviewed.  Constitutional:      General: She is not in acute distress.    Appearance: She is well-developed.  HENT:     Head: Normocephalic and atraumatic.     Mouth/Throat:     Comments: Oral mucosa pink and moist, no tonsillar enlargement or exudate. Posterior pharynx patent and nonerythematous, no uvula deviation or swelling. Normal phonation. Palate elevates symmetrically Eyes:     Extraocular Movements: Extraocular movements intact.     Conjunctiva/sclera: Conjunctivae normal.     Pupils: Pupils are equal, round, and reactive to light.  Cardiovascular:     Rate and Rhythm: Normal rate and regular rhythm.     Heart sounds: No murmur.     Comments: No carotid bruits auscultated Pulmonary:     Effort: Pulmonary effort is normal. No respiratory distress.     Breath sounds: Normal breath sounds.     Comments: Breathing comfortably at rest, CTABL, no wheezing, rales or other adventitious sounds auscultated  No anterior chest tenderness Abdominal:     Palpations: Abdomen is soft.     Tenderness: There is no abdominal tenderness.  Musculoskeletal:     Cervical back: Neck supple.     Comments: Lower legs symmetric without swelling or tenderness  Skin:    General: Skin is warm and dry.  Neurological:     General: No focal deficit present.     Mental Status: She is alert and oriented to person, place, and time. Mental status is at baseline.     Cranial Nerves: No cranial nerve deficit.     Motor: No weakness.      UC Treatments / Results  Labs (all labs ordered are  listed, but only abnormal results are displayed) Labs Reviewed  CBC  BASIC METABOLIC PANEL  TSH    EKG   Radiology No results found.  Procedures Procedures (including critical care time)  Medications Ordered in UC Medications - No data to display  Initial Impression / Assessment and Plan / UC Course  I have reviewed the triage vital signs and the nursing notes.  Pertinent labs & imaging results that were available during my care of the patient were reviewed by me and considered in my medical decision making (see chart for details).     EKG normal sinus rhythm, no acute signs of ischemia or infarction.  Chest x-ray unremarkable.  Blood work pending with CBC, BMP and TSH to further evaluate for palpitations.  Do not suspect ACS at this time or underlying PE.  Recommending follow-up with cardiology outpatient if palpitations persisting as well as primary care.  If developing chest pain or increased shortness of breath, difficulty breathing to follow-up in emergency room.  Discussed strict return precautions. Patient verbalized understanding and is agreeable with plan.  Final Clinical Impressions(s) / UC Diagnoses   Final diagnoses:  None   Discharge Instructions   None    ED Prescriptions    None     PDMP not reviewed this encounter.   Sharyon Cable Embden C, PA-C 09/02/19 1406

## 2019-09-02 NOTE — ED Triage Notes (Signed)
Pt state she has been having chest discomfort  For 3 days torwards the center of her chest.

## 2019-09-18 ENCOUNTER — Telehealth: Payer: Self-pay

## 2019-09-18 NOTE — Telephone Encounter (Signed)
Call placed to [facility] # [phone] regarding the order for CPAP mask.  Spoke to [place] who stated that she does not see any information on patient's account since 2019.  Yet fax receipt for initial CPAP mask order received 05/14/2019 and then receipt for additional information sent 06/30/2020.  Fax had been received from [facility] on 06/09/2020 requesting these notes that were faxed.   Call placed to the patient to inquire if she has received a CPAP mask from [facility]. She said that she didn't have the phone number to call [facility] , yet this CM provided her with that number on 07/30/2019.   However, she said that she is all set, her brother in law gave her one of his masks.  Explained to her that when she needs additional masks, they can be obtained from the [employer]  - CPAP assistance program for $25.

## 2019-09-29 ENCOUNTER — Ambulatory Visit (INDEPENDENT_AMBULATORY_CARE_PROVIDER_SITE_OTHER): Payer: Self-pay | Admitting: Internal Medicine

## 2019-09-29 ENCOUNTER — Encounter: Payer: Self-pay | Admitting: Internal Medicine

## 2019-09-29 ENCOUNTER — Other Ambulatory Visit: Payer: Self-pay | Admitting: Physician Assistant

## 2019-09-29 DIAGNOSIS — F419 Anxiety disorder, unspecified: Secondary | ICD-10-CM

## 2019-09-29 DIAGNOSIS — M25511 Pain in right shoulder: Secondary | ICD-10-CM

## 2019-09-29 DIAGNOSIS — R0981 Nasal congestion: Secondary | ICD-10-CM

## 2019-09-29 MED ORDER — AMOXICILLIN-POT CLAVULANATE 875-125 MG PO TABS: 1.0000 | ORAL_TABLET | Freq: Two times a day (BID) | ORAL | 0 refills | Status: DC

## 2019-09-29 MED ORDER — FLUTICASONE PROPIONATE 50 MCG/ACT NA SUSP: 2.0000 | Freq: Every day | NASAL | 1 refills | Status: DC

## 2019-09-29 MED FILL — AMOX-CLAV 875-125 MG TABLET: 875-125 | 10 days supply | Qty: 20 | Fill #0

## 2019-09-29 MED FILL — FLUTICASONE PROP 50 MCG SPR: 50 | 30 days supply | Qty: 16 | Fill #0

## 2019-09-29 NOTE — Progress Notes (Signed)
Virtual Visit via Telephone Note  I connected with Monique Tyler, on 09/29/2019 at 2:53 PM by telephone due to the COVID-19 pandemic and verified that I am speaking with the correct person using two identifiers.   Consent: I discussed the limitations, risks, security and privacy concerns of performing an evaluation and management service by telephone and the availability of in person appointments. I also discussed with the patient that there may be a patient responsible charge related to this service. The patient expressed understanding and agreed to proceed.   Location of Patient: Home   Location of Provider: Clinic    Persons participating in Telemedicine visit: Monique Tyler Dr. Earlene Plater      History of Present Illness: Patient has a visit for acute concerns.   Patient reports sinus congestion and postnasal drip x4 days. Can't seem to get rid of it. Has been taking Sudafed and Ibuprofen with some relief. Has a dry cough, no phlegm. Has a slight headache.   Reports history of rotator cuff in her right shoulder about 1.5 years ago. Reports sudden pain in her neck and shoulder on the side she had surgery. No known re-injury. ROM in the right shoulder is limited. Taking NSAIDs and using heating pad with no relief. Had her surgery in [place] but reports orthopedic doctor was in [place].    Past Medical History:  Diagnosis Date  . GERD (gastroesophageal reflux disease)   . Hyperlipidemia   . Hypertension    pt denies a hx of HTN  . Kidney stones   . Lung nodules   . Migraine   . Vertigo    Allergies  Allergen Reactions  . Shellfish Allergy Swelling  . Pork-Derived Products Nausea And Vomiting    Current Outpatient Medications on File Prior to Visit  Medication Sig Dispense Refill  . albuterol (PROVENTIL) (2.5 MG/3ML) 0.083% nebulizer solution Take 3 mLs (2.5 mg total) by nebulization every 6 (six) hours as needed for wheezing or shortness of breath. 75 mL 12   . albuterol (VENTOLIN HFA) 108 (90 Base) MCG/ACT inhaler Inhale 2 puffs into the lungs every 4 (four) hours as needed for wheezing or shortness of breath. 18 g 1  . Ascorbic Acid (VITAMIN C) 1000 MG tablet Take 1,000 mg by mouth daily.    . cetirizine (ZYRTEC) 10 MG tablet Take 1 tablet (10 mg total) by mouth daily. 30 tablet 6  . Cholecalciferol (VITAMIN D) 2000 units CAPS Take 2,000 Units by mouth daily.    Marland Kitchen FLUoxetine (PROZAC) 20 MG capsule Take 1 capsule (20 mg total) by mouth daily. 30 capsule 6  . fluticasone (FLONASE) 50 MCG/ACT nasal spray Place 2 sprays into both nostrils daily. 16 g 6  . hydrOXYzine (ATARAX/VISTARIL) 25 MG tablet Take 1 tablet (25 mg total) by mouth 3 (three) times daily as needed (Increase night time dose 50 mg). 120 tablet 6  . topiramate (TOPAMAX) 100 MG tablet TAKE 2 TABLETS BY MOUTH IN THE MORNING AND THEN TAKE 1 TABLET IN THE EVENING 90 tablet 3   No current facility-administered medications on file prior to visit.    Observations/Objective: NAD. Speaking clearly.  Work of breathing normal. Dry cough throughout visit.  Alert and oriented. Mood appropriate.   Assessment and Plan: 1. Sinus congestion Discussed with patient that unlikely to be a bacterial infection at this point given duration <1 week. Will prescribe antibiotic so that if she worsens or is still quite symptomatic in a few days, it is available  for pick up. Advised avoidance of antibiotic if possible. Continue supportive care and also prescribed intranasal corticosteroid.  - amoxicillin-clavulanate (AUGMENTIN) 875-125 MG tablet; Take 1 tablet by mouth 2 (two) times daily.  Dispense: 20 tablet; Refill: 0 - fluticasone (FLONASE) 50 MCG/ACT nasal spray; Place 2 sprays into both nostrils daily.  Dispense: 16 g; Refill: 1  2. Acute pain of right shoulder Patient with prior h/o rotator cuff repair to that shoulder. Concern that she has re-torn or injured tendons due to limited ROM and sudden pain.  Advised to use OTC pain medications and heat to the area. Advised for gentle shoulder ROM exercises to avoid adhesive capsulitis. Urgent re-referral to her orthopedic specialist for evaluation.  - Ambulatory referral to Orthopedic Surgery   Follow Up Instructions: [facility] at [facility]    I discussed the assessment and treatment plan with the patient. The patient was provided an opportunity to ask questions and all were answered. The patient agreed with the plan and demonstrated an understanding of the instructions.   The patient was advised to call back or seek an in-person evaluation if the symptoms worsen or if the condition fails to improve as anticipated.     I provided 14 minutes total of non-face-to-face time during this encounter including median intraservice time, reviewing previous notes, investigations, ordering medications, medical decision making, coordinating care and patient verbalized understanding at the end of the visit.    Phill Myron, D.O. Primary Care at [facility]  09/29/2019, 2:53 PM

## 2019-09-30 MED FILL — FLUoxetine HCL 20 MG CAPS: 20 | 30 days supply | Qty: 30 | Fill #0

## 2019-10-06 ENCOUNTER — Encounter: Payer: Self-pay | Admitting: Family Medicine

## 2019-10-06 ENCOUNTER — Ambulatory Visit: Payer: Self-pay | Attending: Family Medicine | Admitting: Family Medicine

## 2019-10-06 ENCOUNTER — Other Ambulatory Visit: Payer: Self-pay

## 2019-10-06 DIAGNOSIS — I10 Essential (primary) hypertension: Secondary | ICD-10-CM

## 2019-10-06 DIAGNOSIS — M25511 Pain in right shoulder: Secondary | ICD-10-CM

## 2019-10-06 DIAGNOSIS — G8929 Other chronic pain: Secondary | ICD-10-CM

## 2019-10-06 DIAGNOSIS — R002 Palpitations: Secondary | ICD-10-CM

## 2019-10-06 MED ORDER — AMLODIPINE BESYLATE 5 MG PO TABS: 5.0000 mg | ORAL_TABLET | Freq: Every day | ORAL | 1 refills | Status: DC

## 2019-10-06 MED FILL — AMLODIPINE BESYLATE 5 MG TA: 5 | 30 days supply | Qty: 30 | Fill #0

## 2019-10-06 NOTE — Progress Notes (Signed)
Patient has been called and DOB has been verified. Patient has been screened and transferred to PCP to start phone visit.     

## 2019-10-06 NOTE — Progress Notes (Signed)
Virtual Visit via Telephone Note  I connected with Monique Tyler, on 10/06/2019 at 1:50 PM by telephone due to the COVID-19 pandemic and verified that I am speaking with the correct person using two identifiers.   Consent: I discussed the limitations, risks, security and privacy concerns of performing an evaluation and management service by telephone and the availability of in person appointments. I also discussed with the patient that there may be a patient responsible charge related to this service. The patient expressed understanding and agreed to proceed.   Location of Patient: Home  Location of Provider: Clinic   Persons participating in Telemedicine visit: Ethelean Alle Difabio Farrington-CMA Dr. Alvis Lemmings     History of Present Illness: Monique Tyler a 54 year old right handed woman with a history of chronic migraines, anxiety, right shoulder pain status post rotator cuff surgery in 07/2016, seasonal allergies, OSA (uses CPAP at night).  She has been having palpitations with her heart pounding and she has no relief from it even with breathing and relaxation. Brother-in-law gave her an extra CPAP mask (as she did not have one herself) which has helped some. 'It feels like my blood pressure is high'. Unable to check her BP at home. Also has a headache with it. Symptoms have been on for 1 month She went to UC on 09/02/19 and CXR, EKG, labs were unrevealing.  BP was 141/81 She has been in pain in her neck and R shoulder (history of rotator cuff repair); using Ibuprofen with some relief. Hot water helps some.  Referred to orthopedics but was informed that we are waiting on notes from her previous orthopedic surgeon-Dr. Madilyn Fireman whom she has been unable to contact as her case at the time was a Teacher, adult education.   Past Medical History:  Diagnosis Date  . GERD (gastroesophageal reflux disease)   . Hyperlipidemia   . Hypertension    pt denies a hx of HTN  . Kidney stones   . Lung nodules    . Migraine   . Vertigo    Allergies  Allergen Reactions  . Shellfish Allergy Swelling  . Pork-Derived Products Nausea And Vomiting    Current Outpatient Medications on File Prior to Visit  Medication Sig Dispense Refill  . albuterol (PROVENTIL) (2.5 MG/3ML) 0.083% nebulizer solution Take 3 mLs (2.5 mg total) by nebulization every 6 (six) hours as needed for wheezing or shortness of breath. 75 mL 12  . albuterol (VENTOLIN HFA) 108 (90 Base) MCG/ACT inhaler Inhale 2 puffs into the lungs every 4 (four) hours as needed for wheezing or shortness of breath. 18 g 1  . amoxicillin-clavulanate (AUGMENTIN) 875-125 MG tablet Take 1 tablet by mouth 2 (two) times daily. 20 tablet 0  . Ascorbic Acid (VITAMIN C) 1000 MG tablet Take 1,000 mg by mouth daily.    . cetirizine (ZYRTEC) 10 MG tablet Take 1 tablet (10 mg total) by mouth daily. 30 tablet 6  . Cholecalciferol (VITAMIN D) 2000 units CAPS Take 2,000 Units by mouth daily.    Marland Kitchen FLUoxetine (PROZAC) 20 MG capsule TAKE 1 CAPSULE (20 MG TOTAL) BY MOUTH DAILY. 30 capsule 3  . fluticasone (FLONASE) 50 MCG/ACT nasal spray Place 2 sprays into both nostrils daily. 16 g 1  . hydrOXYzine (ATARAX/VISTARIL) 25 MG tablet Take 1 tablet (25 mg total) by mouth 3 (three) times daily as needed (Increase night time dose 50 mg). 120 tablet 6  . topiramate (TOPAMAX) 100 MG tablet TAKE 2 TABLETS BY MOUTH IN THE MORNING AND THEN  TAKE 1 TABLET IN THE EVENING 90 tablet 3   No current facility-administered medications on file prior to visit.    Observations/Objective: Awake, alert, oriented x3 Not in acute distress  Assessment and Plan: 1. Essential hypertension Slightly elevated at ED visit We will commence low-dose amlodipine We will reassess at next office visit in person for improvement Counseled on blood pressure goal of less than 130/80, low-sodium, DASH diet, medication compliance, 150 minutes of moderate intensity exercise per week. Discussed medication  compliance, adverse effects. - amLODipine (NORVASC) 5 MG tablet; Take 1 tablet (5 mg total) by mouth daily.  Dispense: 90 tablet; Refill: 1  2. Palpitation EKG unrevealing, TSH normal We will treat underlying hypertension Could be secondary to anxiety  4.  Right shoulder pain Previous history of rotator cuff repair Uncontrolled Sometimes improved with ibuprofen She will need to see orthopedics and has been advised to discuss inability to obtain previous records with them as alternative arrangements will need to be made so she can be seen  Follow Up Instructions: 3 weeks   I discussed the assessment and treatment plan with the patient. The patient was provided an opportunity to ask questions and all were answered. The patient agreed with the plan and demonstrated an understanding of the instructions.   The patient was advised to call back or seek an in-person evaluation if the symptoms worsen or if the condition fails to improve as anticipated.     I provided 16 minutes total of non-face-to-face time during this encounter including median intraservice time, reviewing previous notes, investigations, ordering medications, medical decision making, coordinating care and patient verbalized understanding at the end of the visit.     Charlott Rakes, MD, FAAFP. [facility] [place], [place]   10/06/2019, 1:50 PM

## 2019-10-15 ENCOUNTER — Ambulatory Visit: Payer: Self-pay | Admitting: Orthopedic Surgery

## 2019-10-29 MED FILL — TOPIRAMATE 100 MG TABS: 100 | 30 days supply | Qty: 90 | Fill #1

## 2019-10-29 MED FILL — FLUoxetine HCL 20 MG CAPS: 20 | 30 days supply | Qty: 30 | Fill #1

## 2019-10-29 MED FILL — hydrOXYzine HCL 25 MG TABS: 25 | 30 days supply | Qty: 120 | Fill #1

## 2019-11-03 ENCOUNTER — Ambulatory Visit: Payer: Self-pay | Attending: Family Medicine | Admitting: Family Medicine

## 2019-11-03 ENCOUNTER — Other Ambulatory Visit: Payer: Self-pay

## 2019-11-03 ENCOUNTER — Encounter: Payer: Self-pay | Admitting: Family Medicine

## 2019-11-03 VITALS — BP 122/85 | HR 98 | Ht 67.0 in | Wt 238.0 lb

## 2019-11-03 DIAGNOSIS — Z636 Dependent relative needing care at home: Secondary | ICD-10-CM

## 2019-11-03 DIAGNOSIS — J3089 Other allergic rhinitis: Secondary | ICD-10-CM

## 2019-11-03 DIAGNOSIS — I10 Essential (primary) hypertension: Secondary | ICD-10-CM

## 2019-11-03 MED ORDER — CETIRIZINE HCL 10 MG PO TABS: 10.0000 mg | ORAL_TABLET | Freq: Every day | ORAL | 6 refills | Status: DC

## 2019-11-03 MED ORDER — FLUTICASONE PROPIONATE 50 MCG/ACT NA SUSP: 2.0000 | Freq: Every day | NASAL | 1 refills | Status: DC

## 2019-11-03 NOTE — Patient Instructions (Signed)
Managing Stress, Adult Feeling a certain amount of stress is normal. Stress helps our body and mind get ready to deal with the demands of life. Stress hormones can motivate you to do well at work and meet your responsibilities. However severe or long-lasting (chronic) stress can affect your mental and physical health. Chronic stress puts you at higher risk for anxiety, depression, and other health problems like digestive problems, muscle aches, heart disease, high blood pressure, and stroke. What are the causes? Common causes of stress include:  Demands from work, such as deadlines, feeling overworked, or having long hours.  Pressures at home, such as money issues, disagreements with a spouse, or parenting issues.  Pressures from major life changes, such as divorce, moving, loss of a loved one, or chronic illness. You may be at higher risk for stress-related problems if you do not get enough sleep, are in poor health, do not have emotional support, or have a mental health disorder like anxiety or depression. How to recognize stress Stress can make you:  Have trouble sleeping.  Feel sad, anxious, irritable, or overwhelmed.  Lose your appetite.  Overeat or want to eat unhealthy foods.  Want to use drugs or alcohol. Stress can also cause physical symptoms, such as:  Sore, tense muscles, especially in the shoulders and neck.  Headaches.  Trouble breathing.  A faster heart rate.  Stomach pain, nausea, or vomiting.  Diarrhea or constipation.  Trouble concentrating. Follow these instructions at home: Lifestyle  Identify the source of your stress and your reaction to it. See a therapist who can help you change your reactions.  When there are stressful events: ? Talk about it with family, friends, or co-workers. ? Try to think realistically about stressful events and not ignore them or overreact. ? Try to find the positives in a stressful situation and not focus on the  negatives. ? Cut back on responsibilities at work and home, if possible. Ask for help from friends or family members if you need it.  Find ways to cope with stress, such as: ? Meditation. ? Deep breathing. ? Yoga or tai chi. ? Progressive muscle relaxation. ? Doing art, playing music, or reading. ? Making time for fun activities. ? Spending time with family and friends.  Get support from family, friends, or spiritual resources. Eating and drinking  Eat a healthy diet. This includes: ? Eating foods that are high in fiber, such as beans, whole grains, and fresh fruits and vegetables. ? Limiting foods that are high in fat and processed sugars, such as fried and sweet foods.  Do not skip meals or overeat.  Drink enough fluid to keep your urine pale yellow. Alcohol use  Do not drink alcohol if: ? Your health care provider tells you not to drink. ? You are pregnant, may be pregnant, or are planning to become pregnant.  Drinking alcohol is a way some people try to ease their stress. This can be dangerous, so if you drink alcohol: ? Limit how much you use to:  0-1 drink a day for women.  0-2 drinks a day for men. ? Be aware of how much alcohol is in your drink. In the [place] one drink equals one 12 oz bottle of beer (355 mL), one 5 oz glass of wine (148 mL), or one 1 oz glass of hard liquor (44 mL). Activity   Include 30 minutes of exercise in your daily schedule. Exercise is a good stress reducer.  Include time in your day   for an activity that you find relaxing. Try taking a walk, going on a bike ride, reading a book, or listening to music.  Schedule your time in a way that lowers stress, and keep a consistent schedule. Prioritize what is most important to get done. General instructions  Get enough sleep. Try to go to sleep and get up at about the same time every day.  Take over-the-counter and prescription medicines only as told by your health care provider.  Do not use any  products that contain nicotine or tobacco, such as cigarettes, e-cigarettes, and chewing tobacco. If you need help quitting, ask your health care provider.  Do not use drugs or smoke to cope with stress.  Keep all follow-up visits as told by your health care provider. This is important. Where to find support  Talk with your health care provider about stress management or finding a support group.  Find a therapist to work with you on your stress management techniques. Contact a health care provider if:  Your stress symptoms get worse.  You are unable to manage your stress at home.  You are struggling to stop using drugs or alcohol. Get help right away if:  You may be a danger to yourself or others.  You have any thoughts of death or suicide. If you ever feel like you may hurt yourself or others, or have thoughts about taking your own life, get help right away. You can go to your nearest emergency department or call:  Your local emergency services (911 in the [place]  A suicide crisis helpline, such as the [facility] at 786-499-0900. This is open 24 hours a day. Summary  Feeling a certain amount of stress is normal, but severe or long-lasting (chronic) stress can affect your mental and physical health.  Chronic stress can put you at higher risk for anxiety, depression, and other health problems like digestive problems, muscle aches, heart disease, high blood pressure, and stroke.  You may be at higher risk for stress-related problems if you do not get enough sleep, are in poor health, lack emotional support, or have a mental health disorder like anxiety or depression.  Identify the source of your stress and your reaction to it. Try talking about stressful events with family, friends, or co-workers, finding a coping method, or getting support from spiritual resources.  If you need more help, talk with your health care provider about finding a support group  or a mental health therapist. This information is not intended to replace advice given to you by your health care provider. Make sure you discuss any questions you have with your health care provider. Document Revised: 02/05/2019 Document Reviewed: 02/05/2019 Elsevier Patient Education  [place].

## 2019-11-03 NOTE — Progress Notes (Signed)
Subjective:  Patient ID: Monique Tyler, female    DOB: 08/02/1965  Age: 54 y.o. MRN: [id number]  CC: Hypertension   HPI Monique Tyler is a 55 year old right handed woman with a history of chronic migraines,anxiety,right shoulder pain status post rotator cuff surgery in 07/2016, seasonal allergies, OSA (uses CPAP at night) here for follow-up of palpitations which she had complained of at her last office visit. Amlodipine was commenced at her last visit and she reports that palpitations have resolved.  She also attributes some of this to stress. Thyroid panel and EKG were previously normal. Son is under admission for hypertensive urgency and she has been stressed lately. She has been having headaches and endorses running out of her Zyrtec and Flonase for seasonal allergies. She has no additional concerns today.  Past Medical History:  Diagnosis Date  . GERD (gastroesophageal reflux disease)   . Hyperlipidemia   . Hypertension    pt denies a hx of HTN  . Kidney stones   . Lung nodules   . Migraine   . Vertigo     Past Surgical History:  Procedure Laterality Date  . CESAREAN SECTION    . MANDIBLE SURGERY    . TUBAL LIGATION      Family History  Problem Relation Age of Onset  . Hypertension Daughter   . Hypertension Son     Allergies  Allergen Reactions  . Shellfish Allergy Swelling  . Pork-Derived Products Nausea And Vomiting    Outpatient Medications Prior to Visit  Medication Sig Dispense Refill  . albuterol (PROVENTIL) (2.5 MG/3ML) 0.083% nebulizer solution Take 3 mLs (2.5 mg total) by nebulization every 6 (six) hours as needed for wheezing or shortness of breath. 75 mL 12  . albuterol (VENTOLIN HFA) 108 (90 Base) MCG/ACT inhaler Inhale 2 puffs into the lungs every 4 (four) hours as needed for wheezing or shortness of breath. 18 g 1  . amLODipine (NORVASC) 5 MG tablet Take 1 tablet (5 mg total) by mouth daily. 90 tablet 1  . Ascorbic Acid (VITAMIN C) 1000 MG  tablet Take 1,000 mg by mouth daily.    . cetirizine (ZYRTEC) 10 MG tablet Take 1 tablet (10 mg total) by mouth daily. 30 tablet 6  . Cholecalciferol (VITAMIN D) 2000 units CAPS Take 2,000 Units by mouth daily.    Marland Kitchen FLUoxetine (PROZAC) 20 MG capsule TAKE 1 CAPSULE (20 MG TOTAL) BY MOUTH DAILY. 30 capsule 3  . fluticasone (FLONASE) 50 MCG/ACT nasal spray Place 2 sprays into both nostrils daily. 16 g 1  . hydrOXYzine (ATARAX/VISTARIL) 25 MG tablet Take 1 tablet (25 mg total) by mouth 3 (three) times daily as needed (Increase night time dose 50 mg). 120 tablet 6  . topiramate (TOPAMAX) 100 MG tablet TAKE 2 TABLETS BY MOUTH IN THE MORNING AND THEN TAKE 1 TABLET IN THE EVENING 90 tablet 3   No facility-administered medications prior to visit.     ROS Review of Systems  Constitutional: Negative for activity change, appetite change and fatigue.  HENT: Negative for congestion, sinus pressure and sore throat.   Eyes: Negative for visual disturbance.  Respiratory: Negative for cough, chest tightness, shortness of breath and wheezing.   Cardiovascular: Negative for chest pain and palpitations.  Gastrointestinal: Negative for abdominal distention, abdominal pain and constipation.  Endocrine: Negative for polydipsia.  Genitourinary: Negative for dysuria and frequency.  Musculoskeletal: Negative for arthralgias and back pain.  Skin: Negative for rash.  Neurological: Positive for headaches. Negative for tremors,  light-headedness and numbness.  Hematological: Does not bruise/bleed easily.  Psychiatric/Behavioral: Negative for agitation and behavioral problems.    Objective:  BP 122/85   Pulse 98   Ht 5\' 7"  (1.702 m)   Wt 238 lb (108 kg)   LMP 01/18/2012   SpO2 98%   BMI 37.28 kg/m   BP/Weight 11/03/2019 12/24/2295 03/31/9210  Systolic BP 941 740 814  Diastolic BP 85 81 68  Wt. (Lbs) 238 230 240.4  BMI 37.28 36.02 37.65      Physical Exam Constitutional:      Appearance: She is  well-developed.  Neck:     Vascular: No JVD.  Cardiovascular:     Rate and Rhythm: Normal rate.     Heart sounds: Normal heart sounds. No murmur.  Pulmonary:     Effort: Pulmonary effort is normal.     Breath sounds: Normal breath sounds. No wheezing or rales.  Chest:     Chest wall: No tenderness.  Abdominal:     General: Bowel sounds are normal. There is no distension.     Palpations: Abdomen is soft. There is no mass.     Tenderness: There is no abdominal tenderness.  Musculoskeletal:        General: Normal range of motion.     Right lower leg: No edema.     Left lower leg: No edema.  Neurological:     Mental Status: She is alert and oriented to person, place, and time.  Psychiatric:        Mood and Affect: Mood normal.     CMP Latest Ref Rng & Units 09/02/2019 02/20/2018 02/14/2018  Glucose 70 - 99 mg/dL 94 96 104(H)  BUN 6 - 20 mg/dL 10 12 12   Creatinine 0.44 - 1.00 mg/dL 0.93 0.97 0.99  Sodium 135 - 145 mmol/L 140 141 139  Potassium 3.5 - 5.1 mmol/L 4.1 3.9 4.3  Chloride 98 - 111 mmol/L 109 110 108(H)  CO2 22 - 32 mmol/L 23 23 19(L)  Calcium 8.9 - 10.3 mg/dL 9.5 9.6 9.5  Total Protein 6.0 - 8.5 g/dL - - 6.7  Total Bilirubin 0.0 - 1.2 mg/dL - - 0.3  Alkaline Phos 39 - 117 IU/L - - 139(H)  AST 0 - 40 IU/L - - 19  ALT 0 - 32 IU/L - - 20    Lipid Panel     Component Value Date/Time   CHOL 206 (H) 11/16/2017 0844   TRIG 101 11/16/2017 0844   HDL 39 (L) 11/16/2017 0844   CHOLHDL 5.3 (H) 11/16/2017 0844   CHOLHDL 4.1 05/03/2011 0519   VLDL 16 05/03/2011 0519   LDLCALC 147 (H) 11/16/2017 0844    CBC    Component Value Date/Time   WBC 4.6 09/02/2019 1331   RBC 4.82 09/02/2019 1331   HGB 13.0 09/02/2019 1331   HGB 12.9 04/03/2019 1216   HCT 42.5 09/02/2019 1331   HCT 40.8 04/03/2019 1216   PLT 286 09/02/2019 1331   PLT 279 04/03/2019 1216   MCV 88.2 09/02/2019 1331   MCV 85 04/03/2019 1216   MCH 27.0 09/02/2019 1331   MCHC 30.6 09/02/2019 1331   RDW 13.2  09/02/2019 1331   RDW 12.8 04/03/2019 1216   LYMPHSABS 1.9 04/03/2019 1216   MONOABS 0.8 06/25/2017 1801   EOSABS 0.1 04/03/2019 1216   BASOSABS 0.0 04/03/2019 1216    Lab Results  Component Value Date   HGBA1C 5.1 04/18/2018    Assessment & Plan:  1. Essential hypertension Controlled Continue amlodipine Counseled on blood pressure goal of less than 130/80, low-sodium, DASH diet, medication compliance, 150 minutes of moderate intensity exercise per week. Discussed medication compliance, adverse effects.   2. Seasonal allergic rhinitis due to other allergic trigger Uncontrolled with associated headaches We will resume antihistamine and nasal spray - cetirizine (ZYRTEC) 10 MG tablet; Take 1 tablet (10 mg total) by mouth daily.  Dispense: 30 tablet; Refill: 6 - fluticasone (FLONASE) 50 MCG/ACT nasal spray; Place 2 sprays into both nostrils daily.  Dispense: 16 g; Refill: 1  3. Caregiver stress Due to son being currently hospitalized Continue Prozac and hydroxyzine for underlying anxiety Discussed stress management techniques     Hoy Register, MD, FAAFP. [facility] [place], [place] [phone]   11/03/2019, 4:54 PM

## 2019-11-04 MED FILL — FLUTICASONE PROP 50 MCG SPR: 50 | 30 days supply | Qty: 16 | Fill #0

## 2019-11-05 MED FILL — AMLODIPINE BESYLATE 5 MG TA: 5 | 30 days supply | Qty: 30 | Fill #1

## 2019-11-28 MED FILL — TOPIRAMATE 100 MG TABS: 100 | 30 days supply | Qty: 90 | Fill #2

## 2019-11-28 MED FILL — FLUoxetine HCL 20 MG CAPS: 20 | 30 days supply | Qty: 30 | Fill #2

## 2020-01-27 ENCOUNTER — Other Ambulatory Visit: Payer: Self-pay | Admitting: Family Medicine

## 2020-01-27 DIAGNOSIS — F419 Anxiety disorder, unspecified: Secondary | ICD-10-CM

## 2020-01-27 MED FILL — FLUoxetine HCL 20 MG CAPS: 20 | 30 days supply | Qty: 30 | Fill #0

## 2020-02-02 ENCOUNTER — Other Ambulatory Visit: Payer: Self-pay | Admitting: Family Medicine

## 2020-02-02 ENCOUNTER — Encounter: Payer: Self-pay | Admitting: Family Medicine

## 2020-02-02 ENCOUNTER — Other Ambulatory Visit: Payer: Self-pay

## 2020-02-02 ENCOUNTER — Ambulatory Visit: Payer: Self-pay | Attending: Family Medicine | Admitting: Family Medicine

## 2020-02-02 VITALS — BP 105/72 | HR 71 | Ht 67.0 in | Wt 238.0 lb

## 2020-02-02 DIAGNOSIS — I10 Essential (primary) hypertension: Secondary | ICD-10-CM

## 2020-02-02 DIAGNOSIS — Z1231 Encounter for screening mammogram for malignant neoplasm of breast: Secondary | ICD-10-CM

## 2020-02-02 DIAGNOSIS — G43709 Chronic migraine without aura, not intractable, without status migrainosus: Secondary | ICD-10-CM

## 2020-02-02 DIAGNOSIS — Z1211 Encounter for screening for malignant neoplasm of colon: Secondary | ICD-10-CM

## 2020-02-02 DIAGNOSIS — F419 Anxiety disorder, unspecified: Secondary | ICD-10-CM

## 2020-02-02 DIAGNOSIS — Z636 Dependent relative needing care at home: Secondary | ICD-10-CM

## 2020-02-02 MED ORDER — FLUOXETINE HCL 20 MG PO CAPS: 20.0000 mg | ORAL_CAPSULE | Freq: Every day | ORAL | 1 refills | Status: DC

## 2020-02-02 MED ORDER — AMLODIPINE BESYLATE 5 MG PO TABS: 5.0000 mg | ORAL_TABLET | Freq: Every day | ORAL | 1 refills | Status: DC

## 2020-02-02 MED ORDER — HYDROXYZINE HCL 25 MG PO TABS: 25.0000 mg | ORAL_TABLET | Freq: Three times a day (TID) | ORAL | 6 refills | Status: DC | PRN

## 2020-02-02 MED ORDER — TOPIRAMATE 100 MG PO TABS: ORAL_TABLET | ORAL | 6 refills | Status: DC

## 2020-02-02 MED FILL — TOPIRAMATE 100 MG TABS: 100 | 30 days supply | Qty: 90 | Fill #0

## 2020-02-02 MED FILL — hydrOXYzine HCL 25 MG TABS: 25 | 30 days supply | Qty: 120 | Fill #2

## 2020-02-02 MED FILL — AMLODIPINE BESYLATE 5 MG TA: 5 | 30 days supply | Qty: 30 | Fill #4

## 2020-02-02 NOTE — Progress Notes (Signed)
States that it has been hard for her, she has to take care of her son.

## 2020-02-02 NOTE — Patient Instructions (Signed)
Managing Stress, Adult Feeling a certain amount of stress is normal. Stress helps our body and mind get ready to deal with the demands of life. Stress hormones can motivate you to do well at work and meet your responsibilities. However severe or long-lasting (chronic) stress can affect your mental and physical health. Chronic stress puts you at higher risk for anxiety, depression, and other health problems like digestive problems, muscle aches, heart disease, high blood pressure, and stroke. What are the causes? Common causes of stress include:  Demands from work, such as deadlines, feeling overworked, or having long hours.  Pressures at home, such as money issues, disagreements with a spouse, or parenting issues.  Pressures from major life changes, such as divorce, moving, loss of a loved one, or chronic illness. You may be at higher risk for stress-related problems if you do not get enough sleep, are in poor health, do not have emotional support, or have a mental health disorder like anxiety or depression. How to recognize stress Stress can make you:  Have trouble sleeping.  Feel sad, anxious, irritable, or overwhelmed.  Lose your appetite.  Overeat or want to eat unhealthy foods.  Want to use drugs or alcohol. Stress can also cause physical symptoms, such as:  Sore, tense muscles, especially in the shoulders and neck.  Headaches.  Trouble breathing.  A faster heart rate.  Stomach pain, nausea, or vomiting.  Diarrhea or constipation.  Trouble concentrating. Follow these instructions at home: Lifestyle  Identify the source of your stress and your reaction to it. See a therapist who can help you change your reactions.  When there are stressful events: ? Talk about it with family, friends, or co-workers. ? Try to think realistically about stressful events and not ignore them or overreact. ? Try to find the positives in a stressful situation and not focus on the  negatives. ? Cut back on responsibilities at work and home, if possible. Ask for help from friends or family members if you need it.  Find ways to cope with stress, such as: ? Meditation. ? Deep breathing. ? Yoga or tai chi. ? Progressive muscle relaxation. ? Doing art, playing music, or reading. ? Making time for fun activities. ? Spending time with family and friends.  Get support from family, friends, or spiritual resources. Eating and drinking  Eat a healthy diet. This includes: ? Eating foods that are high in fiber, such as beans, whole grains, and fresh fruits and vegetables. ? Limiting foods that are high in fat and processed sugars, such as fried and sweet foods.  Do not skip meals or overeat.  Drink enough fluid to keep your urine pale yellow. Alcohol use  Do not drink alcohol if: ? Your health care provider tells you not to drink. ? You are pregnant, may be pregnant, or are planning to become pregnant.  Drinking alcohol is a way some people try to ease their stress. This can be dangerous, so if you drink alcohol: ? Limit how much you use to:  0-1 drink a day for women.  0-2 drinks a day for men. ? Be aware of how much alcohol is in your drink. In the [place] one drink equals one 12 oz bottle of beer (355 mL), one 5 oz glass of wine (148 mL), or one 1 oz glass of hard liquor (44 mL). Activity   Include 30 minutes of exercise in your daily schedule. Exercise is a good stress reducer.  Include time in your day   for an activity that you find relaxing. Try taking a walk, going on a bike ride, reading a book, or listening to music.  Schedule your time in a way that lowers stress, and keep a consistent schedule. Prioritize what is most important to get done. General instructions  Get enough sleep. Try to go to sleep and get up at about the same time every day.  Take over-the-counter and prescription medicines only as told by your health care provider.  Do not use any  products that contain nicotine or tobacco, such as cigarettes, e-cigarettes, and chewing tobacco. If you need help quitting, ask your health care provider.  Do not use drugs or smoke to cope with stress.  Keep all follow-up visits as told by your health care provider. This is important. Where to find support  Talk with your health care provider about stress management or finding a support group.  Find a therapist to work with you on your stress management techniques. Contact a health care provider if:  Your stress symptoms get worse.  You are unable to manage your stress at home.  You are struggling to stop using drugs or alcohol. Get help right away if:  You may be a danger to yourself or others.  You have any thoughts of death or suicide. If you ever feel like you may hurt yourself or others, or have thoughts about taking your own life, get help right away. You can go to your nearest emergency department or call:  Your local emergency services (911 in the [place]  A suicide crisis helpline, such as the [facility] at 786-499-0900. This is open 24 hours a day. Summary  Feeling a certain amount of stress is normal, but severe or long-lasting (chronic) stress can affect your mental and physical health.  Chronic stress can put you at higher risk for anxiety, depression, and other health problems like digestive problems, muscle aches, heart disease, high blood pressure, and stroke.  You may be at higher risk for stress-related problems if you do not get enough sleep, are in poor health, lack emotional support, or have a mental health disorder like anxiety or depression.  Identify the source of your stress and your reaction to it. Try talking about stressful events with family, friends, or co-workers, finding a coping method, or getting support from spiritual resources.  If you need more help, talk with your health care provider about finding a support group  or a mental health therapist. This information is not intended to replace advice given to you by your health care provider. Make sure you discuss any questions you have with your health care provider. Document Revised: 02/05/2019 Document Reviewed: 02/05/2019 Elsevier Patient Education  [place].

## 2020-02-02 NOTE — Progress Notes (Signed)
Subjective:  Patient ID: Monique Tyler, female    DOB: March 27, 1966  Age: 54 y.o. MRN: [id number]  CC: Hypertension   HPI Monique Tyler is a 54 year old right handed woman with a history of chronic migraines,anxiety,right shoulder pain status post rotator cuff surgery in 07/2016, seasonal allergies, OSA(uses CPAP at night) here for follow-up. She has been caring for her son with TBI who is on Hemodialysis and has had multiple hospitalizations and this has her stress test she is his major caregiver and goes with him to all his appointments. At her last visit she was commenced on Prozac for management of her anxiety.  She is able to go to church on Wednesdays and Sundays and is able to distress during those times. Her migraines are controlled on Topamax and she is doing well on her antihypertensive however her blood pressure is low at 105/72 and she endorses just a single episode of dizziness but has otherwise been good.  Past Medical History:  Diagnosis Date  . GERD (gastroesophageal reflux disease)   . Hyperlipidemia   . Hypertension    pt denies a hx of HTN  . Kidney stones   . Lung nodules   . Migraine   . Vertigo     Past Surgical History:  Procedure Laterality Date  . CESAREAN SECTION    . MANDIBLE SURGERY    . TUBAL LIGATION      Family History  Problem Relation Age of Onset  . Hypertension Daughter   . Hypertension Son     Allergies  Allergen Reactions  . Shellfish Allergy Swelling  . Pork-Derived Products Nausea And Vomiting    Outpatient Medications Prior to Visit  Medication Sig Dispense Refill  . albuterol (VENTOLIN HFA) 108 (90 Base) MCG/ACT inhaler Inhale 2 puffs into the lungs every 4 (four) hours as needed for wheezing or shortness of breath. 18 g 1  . Ascorbic Acid (VITAMIN C) 1000 MG tablet Take 1,000 mg by mouth daily.    . cetirizine (ZYRTEC) 10 MG tablet Take 1 tablet (10 mg total) by mouth daily. 30 tablet 6  . Cholecalciferol (VITAMIN D) 2000  units CAPS Take 2,000 Units by mouth daily.    . fluticasone (FLONASE) 50 MCG/ACT nasal spray Place 2 sprays into both nostrils daily. 16 g 1  . amLODipine (NORVASC) 5 MG tablet Take 1 tablet (5 mg total) by mouth daily. 90 tablet 1  . FLUoxetine (PROZAC) 20 MG capsule TAKE 1 CAPSULE (20 MG TOTAL) BY MOUTH DAILY. 30 capsule 3  . hydrOXYzine (ATARAX/VISTARIL) 25 MG tablet Take 1 tablet (25 mg total) by mouth 3 (three) times daily as needed (Increase night time dose 50 mg). 120 tablet 6  . topiramate (TOPAMAX) 100 MG tablet TAKE 2 TABLETS BY MOUTH IN THE MORNING AND THEN TAKE 1 TABLET IN THE EVENING 90 tablet 3  . albuterol (PROVENTIL) (2.5 MG/3ML) 0.083% nebulizer solution Take 3 mLs (2.5 mg total) by nebulization every 6 (six) hours as needed for wheezing or shortness of breath. 75 mL 12   No facility-administered medications prior to visit.     ROS Review of Systems  Constitutional: Negative for activity change, appetite change and fatigue.  HENT: Negative for congestion, sinus pressure and sore throat.   Eyes: Negative for visual disturbance.  Respiratory: Negative for cough, chest tightness, shortness of breath and wheezing.   Cardiovascular: Negative for chest pain and palpitations.  Gastrointestinal: Negative for abdominal distention, abdominal pain and constipation.  Endocrine: Negative for polydipsia.  Genitourinary: Negative for dysuria and frequency.  Musculoskeletal: Negative for arthralgias and back pain.  Skin: Negative for rash.  Neurological: Negative for tremors, light-headedness and numbness.  Hematological: Does not bruise/bleed easily.  Psychiatric/Behavioral: Negative for agitation and behavioral problems.    Objective:  BP 105/72   Pulse 71   Ht 5' 7"  (1.702 m)   Wt 238 lb (108 kg)   LMP 01/18/2012   SpO2 99%   BMI 37.28 kg/m   BP/Weight 02/02/2020 12/26/5607 02/21/1913  Systolic BP 782 956 56  Diastolic BP 72 85 81  Wt. (Lbs) 238 238 56  BMI 37.28 37.28  56.02      Physical Exam Constitutional:      Appearance: She is well-developed.  Neck:     Vascular: No JVD.  Cardiovascular:     Rate and Rhythm: Normal rate.     Heart sounds: Normal heart sounds. No murmur heard.   Pulmonary:     Effort: Pulmonary effort is normal.     Breath sounds: Normal breath sounds. No wheezing or rales.  Chest:     Chest wall: No tenderness.  Abdominal:     General: Bowel sounds are normal. There is no distension.     Palpations: Abdomen is soft. There is no mass.     Tenderness: There is no abdominal tenderness.  Musculoskeletal:        General: Normal range of motion.     Right lower leg: No edema.     Left lower leg: No edema.  Neurological:     Mental Status: She is alert and oriented to person, place, and time.  Psychiatric:        Mood and Affect: Mood normal.     CMP Latest Ref Rng & Units 09/02/2019 02/20/2018 02/14/2018  Glucose 70 - 99 mg/dL 94 96 104(H)  BUN 6 - 20 mg/dL 10 12 12   Creatinine 0.44 - 1.00 mg/dL 0.93 0.97 0.99  Sodium 135 - 145 mmol/L 140 141 139  Potassium 3.5 - 5.1 mmol/L 4.1 3.9 4.3  Chloride 98 - 111 mmol/L 109 110 108(H)  CO2 22 - 32 mmol/L 23 23 19(L)  Calcium 8.9 - 10.3 mg/dL 9.5 9.6 9.5  Total Protein 6.0 - 8.5 g/dL - - 6.7  Total Bilirubin 0.0 - 1.2 mg/dL - - 0.3  Alkaline Phos 39 - 117 IU/L - - 139(H)  AST 0 - 40 IU/L - - 19  ALT 0 - 32 IU/L - - 20    Lipid Panel     Component Value Date/Time   CHOL 206 (H) 11/16/2017 0844   TRIG 101 11/16/2017 0844   HDL 39 (L) 11/16/2017 0844   CHOLHDL 5.3 (H) 11/16/2017 0844   CHOLHDL 4.1 05/03/2011 0519   VLDL 16 05/03/2011 0519   LDLCALC 147 (H) 11/16/2017 0844    CBC    Component Value Date/Time   WBC 4.6 09/02/2019 1331   RBC 4.82 09/02/2019 1331   HGB 13.0 09/02/2019 1331   HGB 12.9 04/03/2019 1216   HCT 42.5 09/02/2019 1331   HCT 40.8 04/03/2019 1216   PLT 286 09/02/2019 1331   PLT 279 04/03/2019 1216   MCV 88.2 09/02/2019 1331   MCV 85  04/03/2019 1216   MCH 27.0 09/02/2019 1331   MCHC 30.6 09/02/2019 1331   RDW 13.2 09/02/2019 1331   RDW 12.8 04/03/2019 1216   LYMPHSABS 1.9 04/03/2019 1216   MONOABS 0.8 06/25/2017 1801   EOSABS 0.1 04/03/2019 1216  BASOSABS 0.0 04/03/2019 1216    Lab Results  Component Value Date   HGBA1C 5.1 04/18/2018    Assessment & Plan:  1. Essential hypertension Controlled with blood pressure on the hypotensive side at 105/72 Advised to monitor blood pressure at home and if her blood pressure sits around this range she will need to decrease amlodipine to 2.5 mg Counseled on blood pressure goal of less than 130/80, low-sodium, DASH diet, medication compliance, 150 minutes of moderate intensity exercise per week. Discussed medication compliance, adverse effects. - CMP14+EGFR; Future - Lipid panel; Future - amLODipine (NORVASC) 5 MG tablet; Take 1 tablet (5 mg total) by mouth daily.  Dispense: 90 tablet; Refill: 1  2. Anxiety Stable Secondary to underlying caregiver stress We have discussed coping strategies - FLUoxetine (PROZAC) 20 MG capsule; Take 1 capsule (20 mg total) by mouth daily.  Dispense: 90 capsule; Refill: 1 - hydrOXYzine (ATARAX/VISTARIL) 25 MG tablet; Take 1 tablet (25 mg total) by mouth 3 (three) times daily as needed (Increase night time dose 50 mg).  Dispense: 120 tablet; Refill: 6  3. Chronic migraine without aura without status migrainosus, not intractable Controlled - topiramate (TOPAMAX) 100 MG tablet; TAKE 2 TABLETS BY MOUTH IN THE MORNING AND THEN TAKE 1 TABLET IN THE EVENING  Dispense: 90 tablet; Refill: 6  4. Screening for colon cancer - Ambulatory referral to Gastroenterology  5. Encounter for screening mammogram for malignant neoplasm of breast - MM 3D SCREEN BREAST BILATERAL; Future  6. Caregiver stress Discussed that she might need to work with her son's PCP to obtain a PCS worker who will assist with caregiver needs so she can take a break   Health  Care Maintenance: Referred for mammogram and colonoscopy Meds ordered this encounter  Medications  . amLODipine (NORVASC) 5 MG tablet    Sig: Take 1 tablet (5 mg total) by mouth daily.    Dispense:  90 tablet    Refill:  1  . FLUoxetine (PROZAC) 20 MG capsule    Sig: Take 1 capsule (20 mg total) by mouth daily.    Dispense:  90 capsule    Refill:  1  . hydrOXYzine (ATARAX/VISTARIL) 25 MG tablet    Sig: Take 1 tablet (25 mg total) by mouth 3 (three) times daily as needed (Increase night time dose 50 mg).    Dispense:  120 tablet    Refill:  6  . topiramate (TOPAMAX) 100 MG tablet    Sig: TAKE 2 TABLETS BY MOUTH IN THE MORNING AND THEN TAKE 1 TABLET IN THE EVENING    Dispense:  90 tablet    Refill:  6    Follow-up: Return in about 6 months (around 08/04/2020) for Chronic disease management.       Charlott Rakes, MD, FAAFP. [facility] [place], [place]   02/02/2020, 5:31 PM

## 2020-02-04 NOTE — Addendum Note (Signed)
Addended byMemory Dance on: 02/04/2020 09:51 AM   Modules accepted: Orders

## 2020-02-05 LAB — CMP14+EGFR
ALT: 14 IU/L (ref 0–32)
AST: 15 IU/L (ref 0–40)
Albumin/Globulin Ratio: 2 (ref 1.2–2.2)
Albumin: 4.3 g/dL (ref 3.8–4.9)
Alkaline Phosphatase: 143 IU/L — ABNORMAL HIGH (ref 48–121)
BUN/Creatinine Ratio: 12 (ref 9–23)
BUN: 13 mg/dL (ref 6–24)
Bilirubin Total: 0.2 mg/dL (ref 0.0–1.2)
CO2: 21 mmol/L (ref 20–29)
Calcium: 9.4 mg/dL (ref 8.7–10.2)
Chloride: 109 mmol/L — ABNORMAL HIGH (ref 96–106)
Creatinine, Ser: 1.09 mg/dL — ABNORMAL HIGH (ref 0.57–1.00)
GFR calc Af Amer: 66 mL/min/{1.73_m2} (ref >59)
GFR calc non Af Amer: 57 mL/min/{1.73_m2} — ABNORMAL LOW (ref >59)
Globulin, Total: 2.2 g/dL (ref 1.5–4.5)
Glucose: 94 mg/dL (ref 65–99)
Potassium: 4.6 mmol/L (ref 3.5–5.2)
Sodium: 141 mmol/L (ref 134–144)
Total Protein: 6.5 g/dL (ref 6.0–8.5)

## 2020-02-05 LAB — LIPID PANEL
Chol/HDL Ratio: 5 ratio — ABNORMAL HIGH (ref 0.0–4.4)
Cholesterol, Total: 204 mg/dL — ABNORMAL HIGH (ref 100–199)
HDL: 41 mg/dL (ref >39)
LDL Chol Calc (NIH): 142 mg/dL — ABNORMAL HIGH (ref 0–99)
Triglycerides: 114 mg/dL (ref 0–149)
VLDL Cholesterol Cal: 21 mg/dL (ref 5–40)

## 2020-02-26 ENCOUNTER — Other Ambulatory Visit: Payer: Self-pay | Admitting: Family Medicine

## 2020-02-26 DIAGNOSIS — F419 Anxiety disorder, unspecified: Secondary | ICD-10-CM

## 2020-02-26 MED ORDER — FLUOXETINE HCL 20 MG PO CAPS: 20.0000 mg | ORAL_CAPSULE | Freq: Every day | ORAL | 1 refills | Status: DC

## 2020-02-26 MED FILL — FLUoxetine HCL 20 MG CAPS: 20 | 30 days supply | Qty: 30 | Fill #1

## 2020-02-26 NOTE — Telephone Encounter (Signed)
Medication: FLUoxetine (PROZAC) 20 MG capsule  Has the patient contacted their pharmacy? YES  (Agent: If no, request that the patient contact the pharmacy for the refill.) (Agent: If yes, when and what did the pharmacy advise?)  Preferred Pharmacy (with phone number or street name): [facility] - [place], [place] - [address]  Phone:  [phone] Fax:  [phone]     Agent: Please be advised that RX refills may take up to 3 business days. We ask that you follow-up with your pharmacy.

## 2020-03-03 MED FILL — AMLODIPINE BESYLATE 5 MG TA: 5 | 30 days supply | Qty: 30 | Fill #5

## 2020-03-26 MED FILL — TOPIRAMATE 100 MG TABS: 100 | 30 days supply | Qty: 90 | Fill #1

## 2020-03-26 MED FILL — hydrOXYzine HCL 25 MG TABS: 25 | 30 days supply | Qty: 120 | Fill #3

## 2020-03-26 MED FILL — FLUoxetine HCL 20 MG CAPS: 20 | 30 days supply | Qty: 30 | Fill #2

## 2020-04-05 MED FILL — AMLODIPINE BESYLATE 5 MG TA: 5 | 30 days supply | Qty: 30 | Fill #0

## 2020-04-28 MED FILL — FLUoxetine HCL 20 MG CAPS: 20 | 30 days supply | Qty: 30 | Fill #3

## 2020-05-07 MED FILL — AMLODIPINE BESYLATE 5 MG TA: 5 | 30 days supply | Qty: 30 | Fill #1

## 2020-05-27 MED FILL — FLUoxetine HCL 20 MG CAPS: 20 | 30 days supply | Qty: 30 | Fill #0

## 2020-06-04 MED FILL — AMLODIPINE BESYLATE 5 MG TA: 5 | 30 days supply | Qty: 30 | Fill #2

## 2020-06-29 MED FILL — FLUoxetine HCL 20 MG CAPS: 20 | 30 days supply | Qty: 30 | Fill #1

## 2020-07-06 MED FILL — AMLODIPINE BESYLATE 5 MG TA: 5 | 30 days supply | Qty: 30 | Fill #3

## 2020-07-29 MED FILL — FLUoxetine HCL 20 MG CAPS: 20 | 30 days supply | Qty: 30 | Fill #2

## 2020-08-06 MED FILL — AMLODIPINE BESYLATE 5 MG TA: 5 | 30 days supply | Qty: 30 | Fill #4

## 2020-08-31 MED FILL — FLUoxetine HCL 20 MG CAPS: 20 | 30 days supply | Qty: 30 | Fill #3

## 2020-09-07 MED FILL — AMLODIPINE BESYLATE 5 MG TA: 5 | 30 days supply | Qty: 30 | Fill #5

## 2020-09-30 MED FILL — FLUoxetine HCL 20 MG CAPS: 20 | 30 days supply | Qty: 30 | Fill #4

## 2020-10-07 ENCOUNTER — Other Ambulatory Visit: Payer: Self-pay | Admitting: Family Medicine

## 2020-10-07 DIAGNOSIS — I10 Essential (primary) hypertension: Secondary | ICD-10-CM

## 2020-10-07 NOTE — Telephone Encounter (Signed)
Courtesy refill. Called patient to schedule appt. No answer, left voicemail to call clinic back to schedule appt.

## 2020-10-23 ENCOUNTER — Other Ambulatory Visit: Payer: Self-pay

## 2020-10-29 ENCOUNTER — Other Ambulatory Visit: Payer: Self-pay

## 2020-10-29 MED FILL — Fluoxetine HCl Cap 20 MG: ORAL | 30 days supply | Qty: 30 | Fill #0 | Status: AC

## 2020-11-04 ENCOUNTER — Other Ambulatory Visit: Payer: Self-pay

## 2020-11-04 ENCOUNTER — Other Ambulatory Visit: Payer: Self-pay | Admitting: Family Medicine

## 2020-11-04 DIAGNOSIS — I10 Essential (primary) hypertension: Secondary | ICD-10-CM

## 2020-11-04 MED ORDER — AMLODIPINE BESYLATE 5 MG PO TABS
ORAL_TABLET | Freq: Every day | ORAL | 0 refills | Status: DC
  Filled 2020-11-04: qty 30, 30d supply, fill #0

## 2020-11-04 MED FILL — Topiramate Tab 100 MG: ORAL | 30 days supply | Qty: 90 | Fill #0 | Status: AC

## 2020-11-22 ENCOUNTER — Other Ambulatory Visit: Payer: Self-pay

## 2020-11-29 ENCOUNTER — Other Ambulatory Visit: Payer: Self-pay | Admitting: Family Medicine

## 2020-11-29 DIAGNOSIS — F419 Anxiety disorder, unspecified: Secondary | ICD-10-CM

## 2020-11-29 NOTE — Telephone Encounter (Signed)
   Notes to clinic: Patient has appt on 11/30/2020 Review for refill   Requested Prescriptions  Pending Prescriptions Disp Refills   FLUoxetine (PROZAC) 20 MG capsule 90 capsule 1    Sig: TAKE 1 CAPSULE (20 MG TOTAL) BY MOUTH DAILY.      Psychiatry:  Antidepressants - SSRI Failed - 11/29/2020  8:53 AM      Failed - Valid encounter within last 6 months    Recent Outpatient Visits           10 months ago Screening for colon cancer   [facility] Hoy Register, MD   1 year ago Caregiver stress   [facility] Hoy Register, MD   1 year ago Essential hypertension   [facility] Hoy Register, MD   1 year ago Obstructive sleep apnea    [facility] Hoy Register, [facility]   1 year ago Obstructive sleep apnea   [facility] [place], [place]ella [place], [place]       Future Appointments             Tomorrow Hoy Register, MD [facility]

## 2020-11-30 ENCOUNTER — Ambulatory Visit: Payer: Self-pay | Attending: Family Medicine | Admitting: Family Medicine

## 2020-11-30 ENCOUNTER — Other Ambulatory Visit: Payer: Self-pay

## 2020-11-30 ENCOUNTER — Encounter: Payer: Self-pay | Admitting: Family Medicine

## 2020-11-30 VITALS — BP 112/72 | HR 69 | Ht 67.0 in | Wt 257.0 lb

## 2020-11-30 DIAGNOSIS — J3089 Other allergic rhinitis: Secondary | ICD-10-CM

## 2020-11-30 DIAGNOSIS — I10 Essential (primary) hypertension: Secondary | ICD-10-CM

## 2020-11-30 DIAGNOSIS — F419 Anxiety disorder, unspecified: Secondary | ICD-10-CM

## 2020-11-30 DIAGNOSIS — G43709 Chronic migraine without aura, not intractable, without status migrainosus: Secondary | ICD-10-CM

## 2020-11-30 DIAGNOSIS — Z636 Dependent relative needing care at home: Secondary | ICD-10-CM

## 2020-11-30 MED ORDER — HYDROXYZINE HCL 25 MG PO TABS
25.0000 mg | ORAL_TABLET | Freq: Three times a day (TID) | ORAL | 1 refills | Status: DC | PRN
  Filled 2020-11-30: qty 90, 30d supply, fill #0
  Filled 2021-04-25: qty 90, 30d supply, fill #1

## 2020-11-30 MED ORDER — AMLODIPINE BESYLATE 5 MG PO TABS
5.0000 mg | ORAL_TABLET | Freq: Every day | ORAL | 1 refills | Status: DC
  Filled 2020-11-30: qty 30, 30d supply, fill #0
  Filled 2020-12-30: qty 30, 30d supply, fill #1
  Filled 2021-01-27: qty 30, 30d supply, fill #2
  Filled 2021-02-25: qty 30, 30d supply, fill #3
  Filled 2021-03-29: qty 30, 30d supply, fill #4
  Filled 2021-04-25: qty 30, 30d supply, fill #5

## 2020-11-30 MED ORDER — ALBUTEROL SULFATE HFA 108 (90 BASE) MCG/ACT IN AERS
2.0000 | INHALATION_SPRAY | RESPIRATORY_TRACT | 1 refills | Status: DC | PRN
  Filled 2020-11-30: qty 18, 16d supply, fill #0
  Filled 2021-04-25: qty 8.5, 25d supply, fill #1

## 2020-11-30 MED ORDER — TOPIRAMATE 100 MG PO TABS
ORAL_TABLET | ORAL | 1 refills | Status: DC
  Filled 2020-11-30 – 2020-12-30 (×2): qty 90, 30d supply, fill #0
  Filled 2021-02-25: qty 90, 30d supply, fill #1
  Filled 2021-03-29: qty 90, 30d supply, fill #2
  Filled 2021-04-25 – 2021-05-25 (×2): qty 90, 30d supply, fill #3

## 2020-11-30 MED ORDER — FLUOXETINE HCL 20 MG PO CAPS
ORAL_CAPSULE | Freq: Every day | ORAL | 1 refills | Status: DC
  Filled 2020-11-30: qty 30, 30d supply, fill #0
  Filled 2020-12-30: qty 30, 30d supply, fill #1
  Filled 2021-01-27: qty 30, 30d supply, fill #2
  Filled 2021-02-25: qty 30, 30d supply, fill #3
  Filled 2021-03-30: qty 30, 30d supply, fill #4
  Filled 2021-04-25: qty 30, 30d supply, fill #5

## 2020-11-30 MED FILL — Topiramate Tab 100 MG: ORAL | 30 days supply | Qty: 90 | Fill #1 | Status: CN

## 2020-11-30 NOTE — Progress Notes (Signed)
Subjective:  Patient ID: Monique Tyler, female    DOB: 1966-03-27  Age: 55 y.o. MRN: [id number]  CC: Hypertension   HPI Patrice Matthew  is a 55 year old right handed woman with a history of chronic migraines,anxiety,right shoulder pain status post rotator cuff surgery in 07/2016, seasonal allergies, OSA(uses CPAP at night)here for follow-up. She has fallen 5 times in the last 12 months but these have been majorly due to her tripping over a box of Sprite, falling in the snow or Mud. She has been compliant with her antihypertensive and her blood pressures at home have been normal. Migraines are controlled on her current regimen. For her anxiety she is on Prozac and hydroxyzine but is currently not undergoing formal counseling.  She speaks to her friend who is a Press photographer.  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  She states she is doing okay. 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 She has no acute concerns today. Past Medical History:  Diagnosis Date  . GERD (gastroesophageal reflux disease)   . Hyperlipidemia   . Hypertension    pt denies a hx of HTN  . Kidney stones   . Lung nodules   . Migraine   . Vertigo     Past Surgical History:  Procedure Laterality Date  . CESAREAN SECTION    . MANDIBLE SURGERY    . TUBAL LIGATION      Family History  Problem Relation Age of Onset  . Hypertension Daughter   . Hypertension Son     Allergies  Allergen Reactions  . Shellfish Allergy Swelling  . Pork-Derived Products Nausea And Vomiting    Outpatient Medications Prior to Visit  Medication Sig Dispense Refill  . Ascorbic Acid (VITAMIN C) 1000 MG tablet Take 1,000 mg by mouth daily.    . cetirizine (ZYRTEC) 10 MG tablet Take 1 tablet (10 mg total) by mouth daily. 30 tablet 6  . Cholecalciferol (VITAMIN D) 2000 units CAPS Take 2,000 Units by mouth daily.    .  fluticasone (FLONASE) 50 MCG/ACT nasal spray Place 2 sprays into both nostrils daily. 16 g 1  . albuterol (VENTOLIN HFA) 108 (90 Base) MCG/ACT inhaler Inhale 2 puffs into the lungs every 4 (four) hours as needed for wheezing or shortness of breath. 18 g 1  . amLODipine (NORVASC) 5 MG tablet TAKE 1 TABLET (5 MG TOTAL) BY MOUTH DAILY. 30 tablet 0  . FLUoxetine (PROZAC) 20 MG capsule TAKE 1 CAPSULE (20 MG TOTAL) BY MOUTH DAILY. 90 capsule 1  . hydrOXYzine (ATARAX/VISTARIL) 25 MG tablet Take 1 tablet (25 mg total) by mouth 3 (three) times daily as needed (Increase night time dose 50 mg). 120 tablet 6  . topiramate (TOPAMAX) 100 MG tablet TAKE 2 TABLETS BY MOUTH IN THE MORNING AND THEN TAKE 1 TABLET IN THE EVENING 90 tablet 6  . albuterol (PROVENTIL) (2.5 MG/3ML) 0.083% nebulizer solution Take 3 mLs (2.5 mg total) by nebulization every 6 (six) hours as needed for wheezing or shortness of breath. 75 mL 12   No facility-administered medications prior to visit.     ROS Review of Systems  Constitutional: Negative for activity change, appetite change and fatigue.  HENT: Negative for congestion, sinus pressure and sore throat.   Eyes: Negative for visual disturbance.  Respiratory: Negative for cough, chest tightness, shortness of breath and wheezing.  Cardiovascular: Negative for chest pain and palpitations.  Gastrointestinal: Negative for abdominal distention, abdominal pain and constipation.  Endocrine: Negative for polydipsia.  Genitourinary: Negative for dysuria and frequency.  Musculoskeletal: Negative for arthralgias and back pain.  Skin: Negative for rash.  Neurological: Negative for tremors, light-headedness and numbness.  Hematological: Does not bruise/bleed easily.  Psychiatric/Behavioral: Negative for agitation and behavioral problems.    Objective:  BP 112/72   Pulse 69   Ht 5' 7" (1.702 m)   Wt 257 lb (116.6 kg)   LMP 01/18/2012   SpO2 98%   BMI 40.25 kg/m   BP/Weight  11/30/2020 02/02/2020 10/24/4740  Systolic BP 595 638 756  Diastolic BP 72 72 85  Wt. (Lbs) 257 238 238  BMI 40.25 37.28 37.28      Physical Exam Constitutional:      Appearance: She is well-developed.  Neck:     Vascular: No JVD.  Cardiovascular:     Rate and Rhythm: Normal rate.     Heart sounds: Normal heart sounds. No murmur heard.   Pulmonary:     Effort: Pulmonary effort is normal.     Breath sounds: Normal breath sounds. No wheezing or rales.  Chest:     Chest wall: No tenderness.  Abdominal:     General: Bowel sounds are normal. There is no distension.     Palpations: Abdomen is soft. There is no mass.     Tenderness: There is no abdominal tenderness.  Musculoskeletal:        General: Normal range of motion.     Right lower leg: No edema.     Left lower leg: No edema.  Neurological:     Mental Status: She is alert and oriented to person, place, and time.  Psychiatric:        Mood and Affect: Mood normal.     CMP Latest Ref Rng & Units 02/04/2020 09/02/2019 02/20/2018  Glucose 65 - 99 mg/dL 94 94 96  BUN 6 - 24 mg/dL _0 Creatinine 0.57 - 1.00 mg/dL 1.09(H) 0.93 0.97  Sodium 134 - 144 mmol/L 141 140 141  Potassium 3.5 - 5.2 mmol/L 4.6 4.1 3.9  Chloride 96 - 106 mmol/L 109(H) 109 110  CO2 20 - 29 mmol/L _1 Calcium 8.7 - 10.2 mg/dL 9.4 9.5 9.6  Total Protein 6.0 - 8.5 g/dL 6.5 - -  Total Bilirubin 0.0 - 1.2 mg/dL 0.2 - -  Alkaline Phos 48 - 121 IU/L 143(H) - -  AST 0 - 40 IU/L 15 - -  ALT 0 - 32 IU/L 14 - -    Lipid Panel     Component Value Date/Time   CHOL 204 (H) 02/04/2020 0954   TRIG 114 02/04/2020 0954   HDL 41 02/04/2020 0954   CHOLHDL 5.0 (H) 02/04/2020 0954   CHOLHDL 4.1 05/03/2011 0519   VLDL 16 05/03/2011 0519   LDLCALC 142 (H) 02/04/2020 0954    CBC    Component Value Date/Time   WBC 4.6 09/02/2019 1331   RBC 4.82 09/02/2019 1331   HGB 13.0 09/02/2019 1331   HGB 12.9 04/03/2019 1216   HCT 42.5 09/02/2019 1331   HCT 40.8  04/03/2019 1216   PLT 286 09/02/2019 1331   PLT 279 04/03/2019 1216   MCV 88.2 09/02/2019 1331   MCV 85 04/03/2019 1216   MCH 27.0 09/02/2019 1331   MCHC 30.6 09/02/2019 1331   RDW 13.2 09/02/2019 1331   RDW 12.8  04/03/2019 1216   LYMPHSABS 1.9 04/03/2019 1216   MONOABS 0.8 06/25/2017 1801   EOSABS 0.1 04/03/2019 1216   BASOSABS 0.0 04/03/2019 1216    Lab Results  Component Value Date   HGBA1C 5.1 04/18/2018    The 10-year ASCVD risk score Mikey Bussing DC Jr., et al., 2013) is: 4.5%   Values used to calculate the score:     Age: 9 years     Sex: Female     Is Non-Hispanic African American: Yes     Diabetic: No     Tobacco smoker: No     Systolic Blood Pressure: 063 mmHg     Is BP treated: Yes     HDL Cholesterol: 41 mg/dL     Total Cholesterol: 204 mg/dL  Assessment & Plan:  1. Essential hypertension Controlled Counseled on blood pressure goal of less than 130/80, low-sodium, DASH diet, medication compliance, 150 minutes of moderate intensity exercise per week. Discussed medication compliance, adverse effects. - amLODipine (NORVASC) 5 MG tablet; Take 1 tablet (5 mg total) by mouth daily.  Dispense: 90 tablet; Refill: 1 - CMP14+EGFR; Future - Lipid panel; Future  2. Chronic migraine without aura without status migrainosus, not intractable Stable - topiramate (TOPAMAX) 100 MG tablet; TAKE 2 TABLETS BY MOUTH IN THE MORNING AND THEN TAKE 1 TABLET IN THE EVENING  Dispense: 270 tablet; Refill: 1  3. Seasonal allergic rhinitis due to other allergic trigger Controlled - albuterol (VENTOLIN HFA) 108 (90 Base) MCG/ACT inhaler; Inhale 2 puffs into the lungs every 4 (four) hours as needed for wheezing or shortness of breath.  Dispense: 18 g; Refill: 1  4. Anxiety Caregiver stress contributing to anxiety She does talk to her friend who is a Social worker - FLUoxetine (PROZAC) 20 MG capsule; TAKE 1 CAPSULE (20 MG TOTAL) BY MOUTH DAILY.  Dispense: 90 capsule; Refill: 1 - hydrOXYzine  (ATARAX/VISTARIL) 25 MG tablet; Take 1 tablet (25 mg total) by mouth 3 (three) times daily as needed (Increase night time dose 50 mg).  Dispense: 270 tablet; Refill: 1  5. Caregiver stress I have spoken with the case manager who will assist in obtaining PCS services for her son to provide some relief for her. We have discussed coping strategies including daily exercise-she will commence with walking about 10 minutes/day   Meds ordered this encounter  Medications  . amLODipine (NORVASC) 5 MG tablet    Sig: Take 1 tablet (5 mg total) by mouth daily.    Dispense:  90 tablet    Refill:  1    Please dispense 90-day supply  . topiramate (TOPAMAX) 100 MG tablet    Sig: TAKE 2 TABLETS BY MOUTH IN THE MORNING AND THEN TAKE 1 TABLET IN THE EVENING    Dispense:  270 tablet    Refill:  1    Please dispense 90-day supply  . albuterol (VENTOLIN HFA) 108 (90 Base) MCG/ACT inhaler    Sig: Inhale 2 puffs into the lungs every 4 (four) hours as needed for wheezing or shortness of breath.    Dispense:  18 g    Refill:  1  . FLUoxetine (PROZAC) 20 MG capsule    Sig: TAKE 1 CAPSULE (20 MG TOTAL) BY MOUTH DAILY.    Dispense:  90 capsule    Refill:  1    Please dispense 90-day supply  . hydrOXYzine (ATARAX/VISTARIL) 25 MG tablet    Sig: Take 1 tablet (25 mg total) by mouth 3 (three) times daily as needed (Increase  night time dose 50 mg).    Dispense:  270 tablet    Refill:  1    Please dispense 90-day supply    Follow-up: No follow-ups on file.       Charlott Rakes, MD, FAAFP. [facility], [place]   11/30/2020, 4:51 PM

## 2020-11-30 NOTE — Patient Instructions (Signed)
The Journal of Orthopaedic and Sports Physical Therapy, 44(10), 748. https://doi.org/10.2519/jospt.2014.0506">  How to Increase Your Level of Physical Activity Getting regular physical activity is important for your overall health and well-being. Most people do not get enough exercise. There are easy ways to increase your level of physical activity, even if you have not been very active in the past or if you are just starting out. How can increasing my physical activity affect me? Physical activity has many short-term and long-term benefits. Being active on a regular basis can improve your physical and mental health as well as provide other benefits. Physical health benefits  Helping you lose weight or maintain a healthy weight.  Strengthening your muscles and bones.  Reducing your risk of certain long-term (chronic) diseases, including heart disease, cancer, and diabetes.  Being able to move around more easily and for longer periods of time without getting tired (increased stamina).  Improving your ability to fight off illness (enhanced immunity).  Being able to sleep better.  Helping you stay healthy as you get older, including: ? Helping you stay mobile, or capable of walking and moving around. ? Preventing accidents, such as falls. ? Increasing life expectancy. Mental health benefits  Boosting your mood and improving your self-esteem.  Lowering your chance of having mental health problems, such as depression or anxiety.  Helping you feel good about your body. Other benefits  Finding new sources of fun and enjoyment.  Meeting new people who share a common interest. What steps can I take to be more physically active? Getting started  If you have a chronic illness or have not been active for a while, check with your health care provider about how to get started. Ask your health care provider what activities are safe for you.  Start out slowly. Walking or doing some simple  chair exercises is a good place to start, especially if you have not been active before or for a long time.  Set goals that you can work toward. Ask your health care provider how much exercise is best for you. In general, most adults should: ? Do moderate-intensity exercise for at least 150 minutes each week (30 minutes on most days of the week) or vigorous exercise for at least 75 minutes each week, or a combination of these.  Moderate-intensity exercise can include walking at a quick pace, biking, yoga, water aerobics, or gardening.  Vigorous exercise involves activities that take more effort, such as jogging or running, playing sports, swimming laps, or jumping rope. ? Do strength exercises on at least 2 days each week. This can include weight lifting, body weight exercises, and resistance-band exercises.  Consider using a fitness tracker, such as a mobile phone app or a device worn like a watch, that will count the number of steps you take each day. Many people strive to reach 10,000 steps a day. Choosing activities  Try to find activities that you enjoy. You are more likely to commit to an exercise routine if it does not feel like a chore.  If you have bone or joint problems, choose low-impact exercises, like walking or swimming.  Use these tips for being successful with an exercise plan: ? Find a workout partner for accountability. ? Join a group or class, such as an aerobics class, cycling class, or sports team. ? Make family time active. Go for a walk, bike, or swim. ? Include a variety of exercises each week. Being active in your daily routines Besides your formal exercise   plans, you can find ways to do physical activity during your daily routines, such as:  Walking or biking to work or to the store.  Taking the stairs instead of the elevator.  Parking farther away from the door at work or at the store.  Planning walking meetings.  Walking around while you are on the phone.    Where to find more information  Centers for Disease Control and Prevention: www.cdc.gov/physicalactivity  President's Council on Fitness, Sports & Nutrition: www.fitness.gov  ChooseMyPlate: www.choosemyplate.gov Contact a health care provider if:  You have headaches, muscle aches, or joint pain.  You feel dizzy or light-headed while exercising.  You faint.  You have chest pain while exercising. Summary  Exercise benefits your mind and body at any age, even if you are just starting out.  If you have a chronic illness or have not been active for a while, check with your health care provider before increasing your physical activity.  Choose activities that are safe and enjoyable for you. Ask your health care provider what activities are safe for you.  Start slowly. Tell your health care provider if you have problems as you start to increase your activity level. This information is not intended to replace advice given to you by your health care provider. Make sure you discuss any questions you have with your health care provider. Document Revised: 04/14/2019 Document Reviewed: 02/03/2019 Elsevier Patient Education  2021 Elsevier Inc.  

## 2020-12-01 ENCOUNTER — Other Ambulatory Visit: Payer: Self-pay

## 2020-12-03 ENCOUNTER — Ambulatory Visit: Payer: Self-pay | Attending: Family Medicine

## 2020-12-03 ENCOUNTER — Other Ambulatory Visit: Payer: Self-pay

## 2020-12-03 DIAGNOSIS — I10 Essential (primary) hypertension: Secondary | ICD-10-CM

## 2020-12-04 LAB — CMP14+EGFR
ALT: 20 IU/L (ref 0–32)
AST: 20 IU/L (ref 0–40)
Albumin/Globulin Ratio: 1.8 (ref 1.2–2.2)
Albumin: 4.2 g/dL (ref 3.8–4.9)
Alkaline Phosphatase: 146 IU/L — ABNORMAL HIGH (ref 44–121)
BUN/Creatinine Ratio: 12 (ref 9–23)
BUN: 13 mg/dL (ref 6–24)
Bilirubin Total: 0.3 mg/dL (ref 0.0–1.2)
CO2: 19 mmol/L — ABNORMAL LOW (ref 20–29)
Calcium: 9.2 mg/dL (ref 8.7–10.2)
Chloride: 109 mmol/L — ABNORMAL HIGH (ref 96–106)
Creatinine, Ser: 1.09 mg/dL — ABNORMAL HIGH (ref 0.57–1.00)
Globulin, Total: 2.3 g/dL (ref 1.5–4.5)
Glucose: 98 mg/dL (ref 65–99)
Potassium: 4.2 mmol/L (ref 3.5–5.2)
Sodium: 142 mmol/L (ref 134–144)
Total Protein: 6.5 g/dL (ref 6.0–8.5)
eGFR: 60 mL/min/{1.73_m2} (ref >59)

## 2020-12-04 LAB — LIPID PANEL
Chol/HDL Ratio: 5.3 ratio — ABNORMAL HIGH (ref 0.0–4.4)
Cholesterol, Total: 219 mg/dL — ABNORMAL HIGH (ref 100–199)
HDL: 41 mg/dL (ref >39)
LDL Chol Calc (NIH): 153 mg/dL — ABNORMAL HIGH (ref 0–99)
Triglycerides: 136 mg/dL (ref 0–149)
VLDL Cholesterol Cal: 25 mg/dL (ref 5–40)

## 2020-12-06 ENCOUNTER — Other Ambulatory Visit: Payer: Self-pay

## 2020-12-06 ENCOUNTER — Other Ambulatory Visit: Payer: Self-pay | Admitting: Family Medicine

## 2020-12-06 DIAGNOSIS — R7989 Other specified abnormal findings of blood chemistry: Secondary | ICD-10-CM

## 2020-12-06 MED ORDER — ATORVASTATIN CALCIUM 20 MG PO TABS
20.0000 mg | ORAL_TABLET | Freq: Every day | ORAL | 1 refills | Status: DC
  Filled 2020-12-06: qty 30, 30d supply, fill #0

## 2020-12-07 ENCOUNTER — Telehealth: Payer: Self-pay

## 2020-12-07 NOTE — Telephone Encounter (Signed)
-----   Message from Hoy Register, MD sent at 12/06/2020  8:35 AM EDT ----- Can you please schedule right upper quadrant ultrasound for this patient?  Thank you.

## 2020-12-07 NOTE — Telephone Encounter (Signed)
Patient name and DOB has been verified Patient was informed of lab results. Patient had no questions.  

## 2020-12-10 ENCOUNTER — Other Ambulatory Visit: Payer: Self-pay

## 2020-12-13 ENCOUNTER — Other Ambulatory Visit: Payer: Self-pay

## 2020-12-13 ENCOUNTER — Ambulatory Visit (HOSPITAL_COMMUNITY)
Admission: RE | Admit: 2020-12-13 | Discharge: 2020-12-13 | Disposition: A | Discharge status: Home / Self Care | Payer: Self-pay | Source: Ambulatory Visit | Attending: Family Medicine | Admitting: Family Medicine

## 2020-12-13 DIAGNOSIS — R7989 Other specified abnormal findings of blood chemistry: Secondary | ICD-10-CM | POA: Insufficient documentation

## 2020-12-17 ENCOUNTER — Other Ambulatory Visit: Payer: Self-pay

## 2020-12-30 ENCOUNTER — Other Ambulatory Visit: Payer: Self-pay

## 2021-01-28 ENCOUNTER — Other Ambulatory Visit: Payer: Self-pay

## 2021-02-25 ENCOUNTER — Other Ambulatory Visit: Payer: Self-pay

## 2021-03-08 ENCOUNTER — Emergency Department (HOSPITAL_BASED_OUTPATIENT_CLINIC_OR_DEPARTMENT_OTHER): Payer: Managed Care, Other (non HMO)

## 2021-03-08 ENCOUNTER — Other Ambulatory Visit: Payer: Self-pay

## 2021-03-08 ENCOUNTER — Emergency Department (HOSPITAL_BASED_OUTPATIENT_CLINIC_OR_DEPARTMENT_OTHER)
Admission: EM | Admit: 2021-03-08 | Discharge: 2021-03-08 | Disposition: A | Discharge status: Home / Self Care | Payer: Managed Care, Other (non HMO) | Attending: Emergency Medicine | Admitting: Emergency Medicine

## 2021-03-08 ENCOUNTER — Encounter (HOSPITAL_BASED_OUTPATIENT_CLINIC_OR_DEPARTMENT_OTHER): Payer: Self-pay | Admitting: *Deleted

## 2021-03-08 DIAGNOSIS — M25561 Pain in right knee: Secondary | ICD-10-CM | POA: Insufficient documentation

## 2021-03-08 DIAGNOSIS — Z79899 Other long term (current) drug therapy: Secondary | ICD-10-CM | POA: Insufficient documentation

## 2021-03-08 DIAGNOSIS — I10 Essential (primary) hypertension: Secondary | ICD-10-CM | POA: Diagnosis not present

## 2021-03-08 DIAGNOSIS — Z87891 Personal history of nicotine dependence: Secondary | ICD-10-CM | POA: Diagnosis not present

## 2021-03-08 DIAGNOSIS — M25551 Pain in right hip: Secondary | ICD-10-CM | POA: Insufficient documentation

## 2021-03-08 DIAGNOSIS — R52 Pain, unspecified: Secondary | ICD-10-CM

## 2021-03-08 DIAGNOSIS — Y990 Civilian activity done for income or pay: Secondary | ICD-10-CM | POA: Diagnosis not present

## 2021-03-08 DIAGNOSIS — W010XXA Fall on same level from slipping, tripping and stumbling without subsequent striking against object, initial encounter: Secondary | ICD-10-CM | POA: Insufficient documentation

## 2021-03-08 DIAGNOSIS — W19XXXA Unspecified fall, initial encounter: Secondary | ICD-10-CM

## 2021-03-08 NOTE — ED Notes (Signed)
Patient transported to X-ray 

## 2021-03-08 NOTE — ED Triage Notes (Signed)
C/o fall from standing landing on right knee c/o knee pain

## 2021-03-08 NOTE — Discharge Instructions (Addendum)
You may alternate taking Tylenol and Naproxen as needed for pain control. You may take Naproxen twice daily as directed on your discharge paperwork and you may take  [phone] mg of Tylenol every 6 hours. Do not exceed 4000 mg of Tylenol daily as this can lead to liver damage. Also, make sure to take Naproxen with meals as it can cause an upset stomach. Do not take other NSAIDs while taking Naproxen such as (Aleve, Ibuprofen, Aspirin, Celebrex, etc) and do not take more than the prescribed dose as this can lead to ulcers and bleeding in your GI tract. You may use warm and cold compresses to help with your symptoms.   Please follow up with your primary doctor or with the orthopedic doctor within the next 7-10 days for re-evaluation and further treatment of your symptoms.   Please return to the ER sooner if you have any new or worsening symptoms.

## 2021-03-08 NOTE — ED Provider Notes (Signed)
[facility] EMERGENCY DEPARTMENT Provider Note   CSN: [id number] Arrival date & time: 03/08/21  1811     History Chief Complaint  Patient presents with   Marletta Lor    Monique Tyler is a 55 y.o. female.  HPI  55 year old female with a history of GERD, hyperlipidemia, hypertension, nephrolithiasis, lung nodules, migraines, vertigo, who presents to the emergency department today for evaluation of a fall.  States that she slipped on something at work and landed on her right knee.  She has pain to the right knee and the right hip.  Pain is constant and severe in nature, occurred suddenly.  She denies any head trauma or LOC.  Past Medical History:  Diagnosis Date   GERD (gastroesophageal reflux disease)    Hyperlipidemia    Hypertension    pt denies a hx of HTN   Kidney stones    Lung nodules    Migraine    Vertigo     Patient Active Problem List   Diagnosis Date Noted   Mediastinal adenopathy 07/05/2018   Obstructive sleep apnea 06/14/2018   Anxiety 03/15/2018   Status post right rotator cuff repair 02/28/2017   Seasonal allergies 02/28/2017   Migraine 11/30/2015   Benign paroxysmal positional vertigo 11/30/2015   Avitaminosis D 11/28/2011    Past Surgical History:  Procedure Laterality Date   CESAREAN SECTION     MANDIBLE SURGERY     TUBAL LIGATION       OB History     Gravida  5   Para  5   Term      Preterm      AB      Living         SAB      IAB      Ectopic      Multiple  1   Live Births              Family History  Problem Relation Age of Onset   Hypertension Daughter    Hypertension Son     Social History   Tobacco Use   Smoking status: Former    Types: Cigarettes   Smokeless tobacco: Never  Vaping Use   Vaping Use: Never used  Substance Use Topics   Alcohol use: No    Alcohol/week: 0.0 standard drinks   Drug use: No    Home Medications Prior to Admission medications   Medication Sig Start Date End Date  Taking? Authorizing Provider  albuterol (PROVENTIL) (2.5 MG/3ML) 0.083% nebulizer solution Take 3 mLs (2.5 mg total) by nebulization every 6 (six) hours as needed for wheezing or shortness of breath. 11/06/18 11/06/19  Storm Frisk, MD  albuterol (VENTOLIN HFA) 108 (90 Base) MCG/ACT inhaler Inhale 2 puffs into the lungs every 4 (four) hours as needed for wheezing or shortness of breath. 11/30/20   Hoy Register, MD  amLODipine (NORVASC) 5 MG tablet Take 1 tablet (5 mg total) by mouth daily. 11/30/20 11/30/21  Hoy Register, MD  Ascorbic Acid (VITAMIN C) 1000 MG tablet Take 1,000 mg by mouth daily.    [provider]  atorvastatin (LIPITOR) 20 MG tablet Take 1 tablet (20 mg total) by mouth daily. 12/06/20   Hoy Register, MD  cetirizine (ZYRTEC) 10 MG tablet Take 1 tablet (10 mg total) by mouth daily. 11/03/19   Hoy Register, MD  Cholecalciferol (VITAMIN D) 2000 units CAPS Take 2,000 Units by mouth daily.    [provider]  FLUoxetine (PROZAC)  20 MG capsule TAKE 1 CAPSULE (20 MG TOTAL) BY MOUTH DAILY. 11/30/20 11/30/21  Hoy Register, MD  fluticasone (FLONASE) 50 MCG/ACT nasal spray Place 2 sprays into both nostrils daily. 11/03/19   Hoy Register, MD  hydrOXYzine (ATARAX/VISTARIL) 25 MG tablet Take 1 tablet (25 mg total) by mouth 3 (three) times daily as needed (Increase night time dose 50 mg). 11/30/20   Hoy Register, MD  topiramate (TOPAMAX) 100 MG tablet TAKE 2 TABLETS BY MOUTH IN THE MORNING AND THEN TAKE 1 TABLET IN THE EVENING 11/30/20 11/30/21  Hoy Register, MD    Allergies    Shellfish allergy and Pork-derived products  Review of Systems   Review of Systems  Constitutional:  Negative for fever.  Musculoskeletal:        Right knee pain, right hip pain   Physical Exam Updated Vital Signs BP 132/75 (BP Location: Left Arm)   Pulse 61   Temp 98.4 F (36.9 C) (Oral)   Resp 18   Ht 5\' 7"  (1.702 m)   Wt 113.4 kg   LMP 01/18/2012   SpO2 97%   BMI 39.16  kg/m   Physical Exam Vitals and nursing note reviewed.  Constitutional:      General: She is not in acute distress.    Appearance: She is well-developed.  HENT:     Head: Normocephalic and atraumatic.  Eyes:     Conjunctiva/sclera: Conjunctivae normal.  Cardiovascular:     Rate and Rhythm: Normal rate.  Pulmonary:     Effort: Pulmonary effort is normal.  Musculoskeletal:     Cervical back: Neck supple.     Comments: TTP to the right patella and medial joint line of the knee. No joint laxity noted. TTP to the right hip. Distal extremity is warm and well perfused.   Skin:    General: Skin is warm and dry.  Neurological:     Mental Status: She is alert.    ED Results / Procedures / Treatments   Labs (all labs ordered are listed, but only abnormal results are displayed) Labs Reviewed - No data to display  EKG None  Radiology DG Knee Complete 4 Views Right  Result Date: 03/08/2021 CLINICAL DATA:  Fall with knee pain EXAM: RIGHT KNEE - COMPLETE 4+ VIEW COMPARISON:  None. FINDINGS: No evidence of fracture, dislocation, or joint effusion. No evidence of arthropathy or other focal bone abnormality. Soft tissues are unremarkable. IMPRESSION: Negative. Electronically Signed   By: 03/10/2021 M.D.   On: 03/08/2021 18:41   DG HIP UNILAT WITH PELVIS 2-3 VIEWS RIGHT  Result Date: 03/08/2021 CLINICAL DATA:  Fall with pain EXAM: DG HIP (WITH OR WITHOUT PELVIS) 2-3V RIGHT COMPARISON:  None. FINDINGS: There is no evidence of hip fracture or dislocation. There is no evidence of arthropathy or other focal bone abnormality. IMPRESSION: Negative. Electronically Signed   By: 03/10/2021 M.D.   On: 03/08/2021 18:42    Procedures Procedures   Medications Ordered in ED Medications - No data to display  ED Course  I have reviewed the triage vital signs and the nursing notes.  Pertinent labs & imaging results that were available during my care of the patient were reviewed by me and  considered in my medical decision making (see chart for details).    MDM Rules/Calculators/A&P                          55 y/o f presenting for fall.  C/o right knee and right hip pain. Xrays both personally reviewed/interpreted and are neg for traumatic injury. Pt placed in knee brace for comfort and given crutches. She is advised to f/u with pcp or orthopedics for reassessment and to return for new or worsening symptoms. All questions answered, pt stable for discharge.    Final Clinical Impression(s) / ED Diagnoses Final diagnoses:  Fall, initial encounter  Acute pain of right knee  Right hip pain    Rx / DC Orders ED Discharge Orders     None        Karrie Meres, PA-C 03/08/21 1914    Rozelle Logan, DO 03/09/21 1945

## 2021-03-10 ENCOUNTER — Ambulatory Visit: Payer: Managed Care, Other (non HMO) | Attending: Physician Assistant | Admitting: Physician Assistant

## 2021-03-10 ENCOUNTER — Other Ambulatory Visit: Payer: Self-pay

## 2021-03-10 ENCOUNTER — Encounter: Payer: Self-pay | Admitting: Physician Assistant

## 2021-03-10 VITALS — BP 136/84 | HR 62 | Ht 67.0 in | Wt 243.0 lb

## 2021-03-10 DIAGNOSIS — I10 Essential (primary) hypertension: Secondary | ICD-10-CM | POA: Diagnosis not present

## 2021-03-10 DIAGNOSIS — M25551 Pain in right hip: Secondary | ICD-10-CM

## 2021-03-10 DIAGNOSIS — R202 Paresthesia of skin: Secondary | ICD-10-CM | POA: Diagnosis not present

## 2021-03-10 DIAGNOSIS — Z09 Encounter for follow-up examination after completed treatment for conditions other than malignant neoplasm: Secondary | ICD-10-CM

## 2021-03-10 DIAGNOSIS — G5603 Carpal tunnel syndrome, bilateral upper limbs: Secondary | ICD-10-CM

## 2021-03-10 DIAGNOSIS — W19XXXD Unspecified fall, subsequent encounter: Secondary | ICD-10-CM

## 2021-03-10 DIAGNOSIS — S8001XD Contusion of right knee, subsequent encounter: Secondary | ICD-10-CM

## 2021-03-10 DIAGNOSIS — G4733 Obstructive sleep apnea (adult) (pediatric): Secondary | ICD-10-CM | POA: Diagnosis not present

## 2021-03-10 MED ORDER — METHOCARBAMOL 500 MG PO TABS
1000.0000 mg | ORAL_TABLET | Freq: Three times a day (TID) | ORAL | 0 refills | Status: DC | PRN
  Filled 2021-03-10: qty 90, 15d supply, fill #0

## 2021-03-10 MED ORDER — GABAPENTIN 300 MG PO CAPS
300.0000 mg | ORAL_CAPSULE | Freq: Every day | ORAL | 0 refills | Status: DC
  Filled 2021-03-10: qty 30, 30d supply, fill #0

## 2021-03-10 NOTE — Progress Notes (Signed)
Numbness in hands and feet.

## 2021-03-10 NOTE — Progress Notes (Signed)
Patient ID: Monique Tyler, female   DOB: 04-20-1966, 55 y.o.   MRN: [id number]   Monique Tyler, is a 55 y.o. female  YQM:[id number]  BMW:[id number]  DOB - [birth year]  Chief Complaint  Patient presents with   Hospitalization Follow-up       Subjective:   Monique Tyler is a 55 y.o. female here today for a follow up visit after being seen in the ED for a fall 03/08/2021.  She has scheduled an appt with ortho.  Still sore.  Imaging included R hip and R knee.  No fractures.  She is in a knee immobilizer.  Advil and tylenol helping some for pain.    Also c/o B wrist and hand tingling that is worse at night.  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  Also c/o paresthesias in B feet.  She has a h/o sleep apnea and has not used CPAP in a while.  Last sleep study 2019.    Patient has No headache, No chest pain, No abdominal pain - No Nausea, No new weakness tingling or numbness, No Cough - SOB.  No problems updated.  ALLERGIES: Allergies  Allergen Reactions   Shellfish Allergy Swelling   Pork-Derived Products Nausea And Vomiting    PAST MEDICAL HISTORY: Past Medical History:  Diagnosis Date   GERD (gastroesophageal reflux disease)    Hyperlipidemia    Hypertension    pt denies a hx of HTN   Kidney stones    Lung nodules    Migraine    Vertigo     MEDICATIONS AT HOME: Prior to Admission medications   Medication Sig Start Date End Date Taking? Authorizing Provider  albuterol (VENTOLIN HFA) 108 (90 Base) MCG/ACT inhaler Inhale 2 puffs into the lungs every 4 (four) hours as needed for wheezing or shortness of breath. 11/30/20  Yes Hoy Register, MD  amLODipine (NORVASC) 5 MG tablet Take 1 tablet (5 mg total) by mouth daily. 11/30/20 11/30/21 Yes Hoy Register, MD  Ascorbic Acid (VITAMIN C) 1000 MG tablet Take 1,000 mg by mouth daily.   Yes [provider]  atorvastatin (LIPITOR) 20 MG tablet Take 1 tablet (20 mg total) by mouth  daily. 12/06/20  Yes Hoy Register, MD  cetirizine (ZYRTEC) 10 MG tablet Take 1 tablet (10 mg total) by mouth daily. 11/03/19  Yes Hoy Register, MD  Cholecalciferol (VITAMIN D) 2000 units CAPS Take 2,000 Units by mouth daily.   Yes [provider]  FLUoxetine (PROZAC) 20 MG capsule TAKE 1 CAPSULE (20 MG TOTAL) BY MOUTH DAILY. 11/30/20 11/30/21 Yes Newlin, Odette Horns, MD  fluticasone (FLONASE) 50 MCG/ACT nasal spray Place 2 sprays into both nostrils daily. 11/03/19  Yes Hoy Register, MD  gabapentin (NEURONTIN) 300 MG capsule Take 1 capsule (300 mg total) by mouth at bedtime. 03/10/21  Yes Anders Simmonds, PA-C  hydrOXYzine (ATARAX/VISTARIL) 25 MG tablet Take 1 tablet (25 mg total) by mouth 3 (three) times daily as needed (Increase night time dose 50 mg). 11/30/20  Yes Hoy Register, MD  methocarbamol (ROBAXIN) 500 MG tablet Take 2 tablets (1,000 mg total) by mouth every 8 (eight) hours as needed for muscle spasms. 03/10/21  Yes Crissie Aloi M, PA-C  topiramate (TOPAMAX) 100 MG tablet TAKE 2 TABLETS BY MOUTH IN THE MORNING AND THEN TAKE 1 TABLET IN THE EVENING 11/30/20 11/30/21 Yes Newlin, Enobong, MD  albuterol (PROVENTIL) (2.5 MG/3ML) 0.083% nebulizer solution Take 3 mLs (2.5 mg total) by nebulization every  6 (six) hours as needed for wheezing or shortness of breath. 11/06/18 11/06/19  Storm Frisk, MD    ROS: Neg HEENT Neg resp Neg cardiac Neg GI Neg GU Neg psych Neg neuro  Objective:   Vitals:   03/10/21 0851  BP: 136/84  Pulse: 62  SpO2: 99%  Weight: 243 lb (110.2 kg)  Height: 5\' 7"  (1.702 m)   Exam General appearance : Awake, alert, not in any distress. Speech Clear. Not toxic looking HEENT: Atraumatic and Normocephalic Neck: Supple, no JVD. No cervical lymphadenopathy.  Chest: Good air entry bilaterally, CTAB.  No rales/rhonchi/wheezing CVS: S1 S2 regular, no murmurs.  B wrists with +phalen's.  Neg tinel's Extremities: B/L Lower Ext shows no edema, both legs  are warm to touch.  Nos sign compartment syndrome;  pulses =B Neurology: Awake alert, and oriented X 3, CN II-XII intact, Non focal Skin: No Rash  Data Review Lab Results  Component Value Date   HGBA1C 5.1 04/18/2018   HGBA1C  12/09/2007    4.6 (NOTE)   The ADA recommends the following therapeutic goals for glycemic   control related to Hgb A1C measurement:   Goal of Therapy:   < 7.0% Hgb A1C   Action Suggested:  > 8.0% Hgb A1C   Ref:  Diabetes Care, 22, Suppl. 1, 1999    Assessment & Plan   1. Bilateral carpal tunnel syndrome - gabapentin (NEURONTIN) 300 MG capsule; Take 1 capsule (300 mg total) by mouth at bedtime.  Dispense: 90 capsule; Refill: 0 -shown on internet CTS wrist splints to order for B hands to wear at night.    2. Essential hypertension Continue current regimen  3. Obstructive sleep apnea - Split night study; Future  4. Paresthesia - B12 and Folate Panel - Thyroid Panel With TSH - Vitamin D, 25-hydroxy - methocarbamol (ROBAXIN) 500 MG tablet; Take 2 tablets (1,000 mg total) by mouth every 8 (eight) hours as needed for muscle spasms.  Dispense: 90 tablet; Refill: 0  5. Contusion of right knee, subsequent encounter F/up with ortho - methocarbamol (ROBAXIN) 500 MG tablet; Take 2 tablets (1,000 mg total) by mouth every 8 (eight) hours as needed for muscle spasms.  Dispense: 90 tablet; Refill: 0  6. Right hip pain F/up with ortho - methocarbamol (ROBAXIN) 500 MG tablet; Take 2 tablets (1,000 mg total) by mouth every 8 (eight) hours as needed for muscle spasms.  Dispense: 90 tablet; Refill: 0  7. Fall, subsequent encounter No fractures-still sore  8. Encounter for examination following treatment at hospital    Patient have been counseled extensively about nutrition and exercise. Other issues discussed during this visit include: low cholesterol diet, weight control and daily exercise, foot care, annual eye examinations at Ophthalmology, importance of adherence  with medications and regular follow-up. We also discussed long term complications of uncontrolled diabetes and hypertension.   Return in about 3 months (around 06/10/2021) for with PCP-chronic conditions.  The patient was given clear instructions to go to ER or return to medical center if symptoms don't improve, worsen or new problems develop. The patient verbalized understanding. The patient was told to call to get lab results if they haven't heard anything in the next week.      06/12/2021, PA-C [facility] [place], [place]   03/10/2021, 9:09 AM

## 2021-03-11 ENCOUNTER — Other Ambulatory Visit: Payer: Self-pay

## 2021-03-11 LAB — B12 AND FOLATE PANEL
Folate: 5.5 ng/mL (ref >3.0)
Vitamin B-12: 271 pg/mL (ref 232–1245)

## 2021-03-11 LAB — THYROID PANEL WITH TSH
Free Thyroxine Index: 1.3 (ref 1.2–4.9)
T3 Uptake Ratio: 24 % (ref 24–39)
T4, Total: 5.3 ug/dL (ref 4.5–12.0)
TSH: 2.24 u[IU]/mL (ref 0.450–4.500)

## 2021-03-11 LAB — VITAMIN D 25 HYDROXY (VIT D DEFICIENCY, FRACTURES): Vit D, 25-Hydroxy: 24.7 ng/mL — ABNORMAL LOW (ref 30.0–100.0)

## 2021-03-11 MED ORDER — TRAMADOL HCL 50 MG PO TABS
ORAL_TABLET | ORAL | 0 refills | Status: DC
  Filled 2021-03-11: qty 15, 4d supply, fill #0

## 2021-03-11 MED ORDER — LIDOCAINE 5 % EX PTCH
MEDICATED_PATCH | CUTANEOUS | 0 refills | Status: DC
  Filled 2021-03-11: qty 15, 15d supply, fill #0

## 2021-03-14 ENCOUNTER — Ambulatory Visit: Payer: Self-pay

## 2021-03-14 ENCOUNTER — Ambulatory Visit (INDEPENDENT_AMBULATORY_CARE_PROVIDER_SITE_OTHER): Payer: Worker's Compensation | Admitting: Family Medicine

## 2021-03-14 ENCOUNTER — Other Ambulatory Visit: Payer: Self-pay

## 2021-03-14 VITALS — Ht 67.0 in | Wt 240.0 lb

## 2021-03-14 DIAGNOSIS — M25561 Pain in right knee: Secondary | ICD-10-CM

## 2021-03-14 DIAGNOSIS — M179 Osteoarthritis of knee, unspecified: Secondary | ICD-10-CM | POA: Insufficient documentation

## 2021-03-14 DIAGNOSIS — M25461 Effusion, right knee: Secondary | ICD-10-CM | POA: Diagnosis not present

## 2021-03-14 DIAGNOSIS — S83241D Other tear of medial meniscus, current injury, right knee, subsequent encounter: Secondary | ICD-10-CM | POA: Insufficient documentation

## 2021-03-14 DIAGNOSIS — M25851 Other specified joint disorders, right hip: Secondary | ICD-10-CM | POA: Diagnosis not present

## 2021-03-14 DIAGNOSIS — M533 Sacrococcygeal disorders, not elsewhere classified: Secondary | ICD-10-CM | POA: Insufficient documentation

## 2021-03-14 MED ORDER — MELOXICAM 7.5 MG PO TABS: 7.5000 mg | ORAL_TABLET | Freq: Two times a day (BID) | ORAL | 1 refills | Status: DC | PRN

## 2021-03-14 MED ORDER — KETOROLAC TROMETHAMINE 30 MG/ML IJ SOLN
30.0000 mg | Freq: Once | INTRAMUSCULAR | Status: AC
Start: 2021-03-14 — End: 2021-03-14
  Administered 2021-03-14: 30 mg via INTRAMUSCULAR

## 2021-03-14 MED ORDER — METHYLPREDNISOLONE ACETATE 40 MG/ML IJ SUSP
40.0000 mg | Freq: Once | INTRAMUSCULAR | Status: AC
  Administered 2021-03-14: 40 mg via INTRAMUSCULAR

## 2021-03-14 NOTE — Patient Instructions (Signed)
Nice to meet you Please try ice on the knee  Please try heat on the hip and back  Please try the exercises  Please try physical therapy  Please try the mobic as needed Please send me a message in MyChart with any questions or updates.  Please see me back in 4 weeks.   --Dr. Jordan Likes

## 2021-03-14 NOTE — Progress Notes (Signed)
Monique Tyler - 55 y.o. female MRN [id number]  Date of birth: 12/30/1965  SUBJECTIVE:  Including CC & ROS.  No chief complaint on file.   Monique Tyler is a 55 y.o. female that is presenting with left knee pain left hip pain and lower back pain.  Pain is occurring after an injury she sustained at work where she fell on the right knee.  Imaging has been negative for fracture.  Has been taking Robaxin.  Pain is worse with prolonged standing and weightbearing.  No history of similar pain..  Independent review of the right hip x-ray from 8/16 shows no acute changes.  Independent review of the right knee x-ray from 8/16 shows no acute changes.   Review of Systems See HPI   HISTORY: Past Medical, Surgical, Social, and Family History Reviewed & Updated per EMR.   Pertinent Historical Findings include:  Past Medical History:  Diagnosis Date   GERD (gastroesophageal reflux disease)    Hyperlipidemia    Hypertension    pt denies a hx of HTN   Kidney stones    Lung nodules    Migraine    Vertigo     Past Surgical History:  Procedure Laterality Date   CESAREAN SECTION     MANDIBLE SURGERY     TUBAL LIGATION      Family History  Problem Relation Age of Onset   Hypertension Daughter    Hypertension Son     Social History   Socioeconomic History   Marital status: Married    Spouse name: Not on file   Number of children: 7   Years of education: Not on file   Highest education level: Not on file  Occupational History   Not on file  Tobacco Use   Smoking status: Former    Types: Cigarettes   Smokeless tobacco: Never  Vaping Use   Vaping Use: Never used  Substance and Sexual Activity   Alcohol use: No    Alcohol/week: 0.0 standard drinks   Drug use: No   Sexual activity: Yes    Birth control/protection: Surgical  Other Topics Concern   Not on file  Social History Narrative   Not on file   Social Determinants of Health   Financial Resource Strain: Not on file  Food  Insecurity: Not on file  Transportation Needs: Not on file  Physical Activity: Not on file  Stress: Not on file  Social Connections: Not on file  Intimate Partner Violence: Not on file     PHYSICAL EXAM:  VS: Ht 5\' 7"  (1.702 m)   Wt 240 lb (108.9 kg)   LMP 01/18/2012   BMI 37.59 kg/m  Physical Exam Gen: NAD, alert, cooperative with exam, well-appearing MSK:  Left knee: Normal range of motion. Tenderness to palpation of the patella. Normal hip range of motion. Normal strength resistance. Neurovascular intact  Limited ultrasound: Left knee, right knee:  Left knee: No effusion in suprapatellar pouch. Normal-appearing quadricep and patellar tendon. No change in the medial or lateral joint space.  Right knee: Effusion noted over the lateral femoral condyle. Normal-appearing quadricep and patellar tendon. Mild degenerative changes of the medial meniscus and lateral meniscus  Summary: Effusion and mild degenerative changes of the meniscus  Ultrasound and interpretation by 01/20/2012, MD    ASSESSMENT & PLAN:   Effusion of right knee Initial injury on 8/16.  Effusion does appear to be occurring laterally after her fall at work.  Imaging is reassuring and could have exacerbated  underlying degenerative change of the meniscus.  Less likely for fracture. -Counseled on home exercise therapy and supportive care. -IM Toradol, IM Depo-Medrol. -Referral to physical therapy. -Mobic. -Could consider injection.   SI (sacroiliac) joint dysfunction Having some pain over the SI joint that is a result of her fall on her right knee while she was at work. -Counseled on home exercise therapy and supportive care. -Referral to physical therapy. -Could consider injection and imaging  Hip impingement syndrome, right Having pain that is more reminiscent of impingement from her fall while at work. -Counseled on home exercise therapy and supportive care. -Referral to physical  therapy. -Could consider injection

## 2021-03-15 NOTE — Assessment & Plan Note (Signed)
Having pain that is more reminiscent of impingement from her fall while at work. -Counseled on home exercise therapy and supportive care. -Referral to physical therapy. -Could consider injection

## 2021-03-15 NOTE — Assessment & Plan Note (Addendum)
Initial injury on 8/16.  Effusion does appear to be occurring laterally after her fall at work.  Imaging is reassuring and could have exacerbated underlying degenerative change of the meniscus.  Less likely for fracture. -Counseled on home exercise therapy and supportive care. -IM Toradol, IM Depo-Medrol. -Referral to physical therapy. -Mobic. -Could consider injection.

## 2021-03-15 NOTE — Assessment & Plan Note (Signed)
Having some pain over the SI joint that is a result of her fall on her right knee while she was at work. -Counseled on home exercise therapy and supportive care. -Referral to physical therapy. -Could consider injection and imaging

## 2021-03-16 ENCOUNTER — Other Ambulatory Visit: Payer: Self-pay | Admitting: Physician Assistant

## 2021-03-16 ENCOUNTER — Other Ambulatory Visit: Payer: Self-pay

## 2021-03-16 MED ORDER — VITAMIN D 50 MCG (2000 UT) PO CAPS
2000.0000 [IU] | ORAL_CAPSULE | Freq: Every day | ORAL | 1 refills | Status: DC
  Filled 2021-03-16: qty 90, 90d supply, fill #0

## 2021-03-29 ENCOUNTER — Other Ambulatory Visit: Payer: Self-pay

## 2021-03-30 ENCOUNTER — Other Ambulatory Visit: Payer: Self-pay

## 2021-04-11 ENCOUNTER — Ambulatory Visit: Payer: Managed Care, Other (non HMO) | Admitting: Family Medicine

## 2021-04-11 NOTE — Progress Notes (Deleted)
  Merve Hotard - 55 y.o. female MRN [id number]  Date of birth: [birth year]  SUBJECTIVE:  Including CC & ROS.  No chief complaint on file.   Atalaya Zappia is a 55 y.o. female that is  ***.  ***   Review of Systems See HPI   HISTORY: Past Medical, Surgical, Social, and Family History Reviewed & Updated per EMR.   Pertinent Historical Findings include:  Past Medical History:  Diagnosis Date   GERD (gastroesophageal reflux disease)    Hyperlipidemia    Hypertension    pt denies a hx of HTN   Kidney stones    Lung nodules    Migraine    Vertigo     Past Surgical History:  Procedure Laterality Date   CESAREAN SECTION     MANDIBLE SURGERY     TUBAL LIGATION      Family History  Problem Relation Age of Onset   Hypertension Daughter    Hypertension Son     Social History   Socioeconomic History   Marital status: Married    Spouse name: Not on file   Number of children: 7   Years of education: Not on file   Highest education level: Not on file  Occupational History   Not on file  Tobacco Use   Smoking status: Former    Types: Cigarettes   Smokeless tobacco: Never  Vaping Use   Vaping Use: Never used  Substance and Sexual Activity   Alcohol use: No    Alcohol/week: 0.0 standard drinks   Drug use: No   Sexual activity: Yes    Birth control/protection: Surgical  Other Topics Concern   Not on file  Social History Narrative   Not on file   Social Determinants of Health   Financial Resource Strain: Not on file  Food Insecurity: Not on file  Transportation Needs: Not on file  Physical Activity: Not on file  Stress: Not on file  Social Connections: Not on file  Intimate Partner Violence: Not on file     PHYSICAL EXAM:  VS: LMP 01/18/2012  Physical Exam Gen: NAD, alert, cooperative with exam, well-appearing MSK:  ***      ASSESSMENT & PLAN:   No problem-specific Assessment & Plan notes found for this encounter.

## 2021-04-25 ENCOUNTER — Other Ambulatory Visit: Payer: Self-pay

## 2021-04-27 ENCOUNTER — Other Ambulatory Visit: Payer: Self-pay

## 2021-05-02 ENCOUNTER — Other Ambulatory Visit: Payer: Self-pay

## 2021-05-25 ENCOUNTER — Other Ambulatory Visit: Payer: Self-pay | Admitting: Family Medicine

## 2021-05-25 ENCOUNTER — Other Ambulatory Visit: Payer: Self-pay

## 2021-05-25 DIAGNOSIS — I10 Essential (primary) hypertension: Secondary | ICD-10-CM

## 2021-05-25 MED ORDER — AMLODIPINE BESYLATE 5 MG PO TABS
5.0000 mg | ORAL_TABLET | Freq: Every day | ORAL | 0 refills | Status: DC
  Filled 2021-05-25: qty 30, 30d supply, fill #0

## 2021-05-25 NOTE — Telephone Encounter (Signed)
Requested Prescriptions  Pending Prescriptions Disp Refills  . amLODipine (NORVASC) 5 MG tablet 90 tablet 0    Sig: Take 1 tablet (5 mg total) by mouth daily.     Cardiovascular:  Calcium Channel Blockers Passed - 05/25/2021  2:43 PM      Passed - Last BP in normal range    BP Readings from Last 1 Encounters:  03/10/21 136/84         Passed - Valid encounter within last 6 months    Recent Outpatient Visits          2 months ago Bilateral carpal tunnel syndrome   [facility] [place], [place], [place]   5 months ago Caregiver stress   [facility] [place], Odette Horns, MD   1 year ago Screening for colon cancer   [facility] [place], Odette Horns, [place]   1 year ago Caregiver stress   [facility] Hoy Register, MD   1 year ago Essential hypertension   [facility] Hoy Register, MD      Future Appointments            In 2 weeks Hoy Register, MD [facility]

## 2021-05-26 ENCOUNTER — Other Ambulatory Visit: Payer: Self-pay | Admitting: Family Medicine

## 2021-05-26 ENCOUNTER — Other Ambulatory Visit: Payer: Self-pay

## 2021-05-26 DIAGNOSIS — F419 Anxiety disorder, unspecified: Secondary | ICD-10-CM

## 2021-05-26 MED ORDER — FLUOXETINE HCL 20 MG PO CAPS
ORAL_CAPSULE | Freq: Every day | ORAL | 0 refills | Status: DC
  Filled 2021-05-26: qty 30, 30d supply, fill #0

## 2021-05-26 NOTE — Telephone Encounter (Signed)
Requested Prescriptions  Pending Prescriptions Disp Refills  . FLUoxetine (PROZAC) 20 MG capsule 90 capsule 0    Sig: TAKE 1 CAPSULE (20 MG TOTAL) BY MOUTH DAILY.     Psychiatry:  Antidepressants - SSRI Passed - 05/26/2021  1:03 PM      Passed - Valid encounter within last 6 months    Recent Outpatient Visits          2 months ago Bilateral carpal tunnel syndrome   [facility] [place], [employer], [place]   5 months ago Caregiver stress   [facility] [place], Odette Horns, MD   1 year ago Screening for colon cancer   [facility] [place], Odette Horns, MD   1 year ago Caregiver stress   [facility] Hoy Register, MD   1 year ago Essential hypertension   [facility] Hoy Register, MD      Future Appointments            In 2 weeks Hoy Register, MD [facility]

## 2021-05-27 ENCOUNTER — Other Ambulatory Visit: Payer: Self-pay

## 2021-06-09 ENCOUNTER — Ambulatory Visit: Payer: Managed Care, Other (non HMO) | Attending: Family Medicine | Admitting: Family Medicine

## 2021-06-09 ENCOUNTER — Other Ambulatory Visit: Payer: Self-pay

## 2021-06-09 ENCOUNTER — Encounter: Payer: Self-pay | Admitting: Family Medicine

## 2021-06-09 VITALS — BP 132/85 | HR 67 | Ht 67.0 in | Wt 245.6 lb

## 2021-06-09 DIAGNOSIS — G43709 Chronic migraine without aura, not intractable, without status migrainosus: Secondary | ICD-10-CM | POA: Insufficient documentation

## 2021-06-09 DIAGNOSIS — F419 Anxiety disorder, unspecified: Secondary | ICD-10-CM | POA: Diagnosis not present

## 2021-06-09 DIAGNOSIS — Z79899 Other long term (current) drug therapy: Secondary | ICD-10-CM | POA: Insufficient documentation

## 2021-06-09 DIAGNOSIS — Z1231 Encounter for screening mammogram for malignant neoplasm of breast: Secondary | ICD-10-CM

## 2021-06-09 DIAGNOSIS — K219 Gastro-esophageal reflux disease without esophagitis: Secondary | ICD-10-CM | POA: Diagnosis not present

## 2021-06-09 DIAGNOSIS — R002 Palpitations: Secondary | ICD-10-CM | POA: Diagnosis not present

## 2021-06-09 DIAGNOSIS — E785 Hyperlipidemia, unspecified: Secondary | ICD-10-CM | POA: Insufficient documentation

## 2021-06-09 DIAGNOSIS — Z1211 Encounter for screening for malignant neoplasm of colon: Secondary | ICD-10-CM

## 2021-06-09 DIAGNOSIS — I10 Essential (primary) hypertension: Secondary | ICD-10-CM | POA: Insufficient documentation

## 2021-06-09 MED ORDER — HYDROXYZINE HCL 25 MG PO TABS: 25.0000 mg | ORAL_TABLET | Freq: Three times a day (TID) | ORAL | 1 refills | Status: DC | PRN

## 2021-06-09 MED ORDER — AMLODIPINE BESYLATE 5 MG PO TABS
5.0000 mg | ORAL_TABLET | Freq: Every day | ORAL | 1 refills | Status: DC
  Filled 2021-06-09: qty 90, 90d supply, fill #0
  Filled 2021-06-22: qty 30, 30d supply, fill #0
  Filled 2021-07-25: qty 30, 30d supply, fill #1
  Filled 2021-08-31: qty 30, 30d supply, fill #2
  Filled 2021-08-31: qty 30, 30d supply, fill #0
  Filled 2021-09-29: qty 30, 30d supply, fill #1
  Filled 2021-11-02: qty 30, 30d supply, fill #2
  Filled 2021-12-02: qty 30, 30d supply, fill #3

## 2021-06-09 MED ORDER — FLUOXETINE HCL 20 MG PO CAPS: ORAL_CAPSULE | Freq: Every day | ORAL | 1 refills | Status: DC

## 2021-06-09 MED ORDER — TOPIRAMATE 100 MG PO TABS: ORAL_TABLET | ORAL | 1 refills | Status: DC

## 2021-06-09 MED ORDER — FLUOXETINE HCL 20 MG PO CAPS
ORAL_CAPSULE | Freq: Every day | ORAL | 1 refills | Status: DC
  Filled 2021-06-09: qty 90, fill #0
  Filled 2021-06-22: qty 30, 30d supply, fill #0
  Filled 2021-07-25: qty 30, 30d supply, fill #1
  Filled 2021-08-31: qty 30, 30d supply, fill #0
  Filled 2021-08-31: qty 30, 30d supply, fill #2
  Filled 2021-09-29: qty 30, 30d supply, fill #1
  Filled 2021-11-02: qty 30, 30d supply, fill #2
  Filled 2021-12-02: qty 30, 30d supply, fill #3

## 2021-06-09 MED ORDER — HYDROXYZINE HCL 25 MG PO TABS
25.0000 mg | ORAL_TABLET | Freq: Three times a day (TID) | ORAL | 1 refills | Status: DC | PRN
  Filled 2021-06-09 – 2022-05-19 (×2): qty 90, 30d supply, fill #0

## 2021-06-09 MED ORDER — AMLODIPINE BESYLATE 5 MG PO TABS: 5.0000 mg | ORAL_TABLET | Freq: Every day | ORAL | 1 refills | Status: DC

## 2021-06-09 MED ORDER — TOPIRAMATE 100 MG PO TABS
ORAL_TABLET | ORAL | 1 refills | Status: DC
  Filled 2021-06-09 – 2021-08-31 (×2): qty 270, fill #0
  Filled 2021-08-31: qty 90, 30d supply, fill #0
  Filled 2021-09-29: qty 90, 30d supply, fill #1

## 2021-06-09 NOTE — Progress Notes (Signed)
Subjective:  Patient ID: Monique Tyler, female    DOB: [birth year]  Age: 54 y.o. MRN: [id number]  CC: Hypertension   HPI Monique Tyler is a 55 y.o. year old female with a history of chronic migraines, anxiety, right shoulder pain status post rotator cuff surgery in 07/2016, seasonal allergies, OSA (uses CPAP at night) here for follow-up.  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  She is not as stressed as she was previously and her son who had a traumatic brain injury does have a Scientist, product/process development which has provided her some relief.  She has not been taking her atorvastatin as she was concerned of adverse effects it might cause even though her cholesterol is elevated.  Her 10-year ASCVD risk score is 16.3%.  Complains of heart palpitations especially when she carries her book bag or goes to sleep. Not from anxiety she states and she sometimes feels HR is irregular. She drins coffee but every other day. Denies chest pain but sometimes has associated dyspnea. Symptoms can also occur when she feels her Bra strap is too tight. TSH was normal in 02/2021. Migraines are stable on Topamax.  Past Medical History:  Diagnosis Date   GERD (gastroesophageal reflux disease)    Hyperlipidemia    Hypertension    pt denies a hx of HTN   Kidney stones    Lung nodules    Migraine    Vertigo     Past Surgical History:  Procedure Laterality Date   CESAREAN SECTION     MANDIBLE SURGERY     TUBAL LIGATION      Family History  Problem Relation Age of Onset   Hypertension Daughter    Hypertension Son     Allergies  Allergen Reactions   Shellfish Allergy Swelling   Pork-Derived Products Nausea And Vomiting    Outpatient Medications Prior to Visit  Medication Sig Dispense Refill   albuterol (VENTOLIN HFA) 108 (90 Base) MCG/ACT inhaler Inhale 2 puffs into the lungs  every 4 (four) hours as needed for wheezing or shortness of breath. 18 g 1   amLODipine (NORVASC) 5 MG tablet Take 1 tablet (5 mg total) by mouth daily. 90 tablet 0   Ascorbic Acid (VITAMIN C) 1000 MG tablet Take 1,000 mg by mouth daily.     atorvastatin (LIPITOR) 20 MG tablet Take 1 tablet (20 mg total) by mouth daily. 90 tablet 1   cetirizine (ZYRTEC) 10 MG tablet Take 1 tablet (10 mg total) by mouth daily. 30 tablet 6   Cholecalciferol (VITAMIN D) 50 MCG (2000 UT) CAPS Take 1 capsule (2,000 Units total) by mouth daily. 90 capsule 1   FLUoxetine (PROZAC) 20 MG capsule TAKE 1 CAPSULE (20 MG TOTAL) BY MOUTH DAILY. 90 capsule 0   fluticasone (FLONASE) 50 MCG/ACT nasal spray Place 2 sprays into both nostrils daily. 16 g 1   gabapentin (NEURONTIN) 300 MG capsule Take 1 capsule (300 mg total) by mouth at bedtime. 90 capsule 0   hydrOXYzine (ATARAX/VISTARIL) 25 MG tablet Take 1 tablet (25 mg total) by mouth 3 (three) times daily as needed (Increase night time dose 50 mg). 270 tablet 1   lidocaine (LIDODERM) 5 % Apply 1 patch topically 1 (one) time each day for 15 days. Remove & discard patch within 12 hours or as directed by MD. 15 patch 0  meloxicam (MOBIC) 7.5 MG tablet Take 1 tablet (7.5 mg total) by mouth 2 (two) times daily as needed. 45 tablet 1   methocarbamol (ROBAXIN) 500 MG tablet Take 2 tablets (1,000 mg total) by mouth every 8 (eight) hours as needed for muscle spasms. 90 tablet 0   topiramate (TOPAMAX) 100 MG tablet TAKE 2 TABLETS BY MOUTH IN THE MORNING AND THEN TAKE 1 TABLET IN THE EVENING 270 tablet 1   traMADol (ULTRAM) 50 MG tablet Take 1 tablet (50 mg total) by mouth every 6 (six) hours if needed for severe pain for up to 5 days. 15 tablet 0   albuterol (PROVENTIL) (2.5 MG/3ML) 0.083% nebulizer solution Take 3 mLs (2.5 mg total) by nebulization every 6 (six) hours as needed for wheezing or shortness of breath. 75 mL 12   No facility-administered medications prior to visit.      ROS Review of Systems  Constitutional:  Negative for activity change, appetite change and fatigue.  HENT:  Negative for congestion, sinus pressure and sore throat.   Eyes:  Negative for visual disturbance.  Respiratory:  Negative for cough, chest tightness, shortness of breath and wheezing.   Cardiovascular:  Negative for chest pain and palpitations.  Gastrointestinal:  Negative for abdominal distention, abdominal pain and constipation.  Endocrine: Negative for polydipsia.  Genitourinary:  Negative for dysuria and frequency.  Musculoskeletal:  Negative for arthralgias and back pain.  Skin:  Negative for rash.  Neurological:  Negative for tremors, light-headedness and numbness.  Hematological:  Does not bruise/bleed easily.  Psychiatric/Behavioral:  Negative for agitation and behavioral problems.    Objective:  BP 132/85   Pulse 67   Ht 5\' 7"  (1.702 m)   Wt 245 lb 9.6 oz (111.4 kg)   LMP 01/18/2012   SpO2 98%   BMI 38.47 kg/m   BP/Weight 06/09/2021 03/14/2021 03/10/2021  Systolic BP 132 - 136  Diastolic BP 85 - 84  Wt. (Lbs) 245.6 240 243  BMI 38.47 37.59 38.06      Physical Exam Constitutional:      Appearance: She is well-developed.  Cardiovascular:     Rate and Rhythm: Normal rate.     Heart sounds: Normal heart sounds. No murmur heard. Pulmonary:     Effort: Pulmonary effort is normal.     Breath sounds: Normal breath sounds. No wheezing or rales.  Chest:     Chest wall: No tenderness.  Abdominal:     General: Bowel sounds are normal. There is no distension.     Palpations: Abdomen is soft. There is no mass.     Tenderness: There is no abdominal tenderness.  Musculoskeletal:        General: Normal range of motion.     Right lower leg: No edema.     Left lower leg: No edema.  Neurological:     Mental Status: She is alert and oriented to person, place, and time.  Psychiatric:        Mood and Affect: Mood normal.    CMP Latest Ref Rng & Units  12/03/2020 02/04/2020 09/02/2019  Glucose 65 - 99 mg/dL 98 94 94  BUN 6 - 24 mg/dL 13 13 10   Creatinine 0.57 - 1.00 mg/dL 10/31/2019) ) 3.21(Y  Sodium 134 - 144 mmol/L 142 141 140  Potassium 3.5 - 5.2 mmol/L 4.2 4.6 4.1  Chloride 96 - 106 mmol/L 109(H) 109(H) 109  CO2 20 - 29 mmol/L 19(L) 21 23  Calcium 8.7 - 10.2 mg/dL 9.2 9.4  9.5  Total Protein 6.0 - 8.5 g/dL 6.5 6.5 -  Total Bilirubin 0.0 - 1.2 mg/dL 0.3 0.2 -  Alkaline Phos 44 - 121 IU/L 146(H) 143(H) -  AST 0 - 40 IU/L 20 15 -  ALT 0 - 32 IU/L 20 14 -    Lipid Panel     Component Value Date/Time   CHOL 219 (H) 12/03/2020 0859   TRIG 136 12/03/2020 0859   HDL 41 12/03/2020 0859   CHOLHDL 5.3 (H) 12/03/2020 0859   CHOLHDL 4.1 05/03/2011 0519   VLDL 16 05/03/2011 0519   LDLCALC 153 (H) 12/03/2020 0859    CBC    Component Value Date/Time   WBC 4.6 09/02/2019 1331   RBC 4.82 09/02/2019 1331   HGB 13.0 09/02/2019 1331   HGB 12.9 04/03/2019 1216   HCT 42.5 09/02/2019 1331   HCT 40.8 04/03/2019 1216   PLT 286 09/02/2019 1331   PLT 279 04/03/2019 1216   MCV 88.2 09/02/2019 1331   MCV 85 04/03/2019 1216   MCH 27.0 09/02/2019 1331   MCHC 30.6 09/02/2019 1331   RDW 13.2 09/02/2019 1331   RDW 12.8 04/03/2019 1216   LYMPHSABS 1.9 04/03/2019 1216   MONOABS 0.8 06/25/2017 1801   EOSABS 0.1 04/03/2019 1216   BASOSABS 0.0 04/03/2019 1216    Lab Results  Component Value Date   HGBA1C 5.1 04/18/2018    The 10-year ASCVD risk score (Arnett DK, et al., 2019) is: 16.3%   Values used to calculate the score:     Age: 30 years     Sex: Female     Is Non-Hispanic African American: Yes     Diabetic: No     Tobacco smoker: Yes     Systolic Blood Pressure: 132 mmHg     Is BP treated: Yes     HDL Cholesterol: 41 mg/dL     Total Cholesterol: 219 mg/dL  Assessment & Plan:  1. Chronic migraine without aura without status migrainosus, not intractable Controlled - topiramate (TOPAMAX) 100 MG tablet; TAKE 2 TABLETS BY MOUTH IN THE  MORNING AND THEN TAKE 1 TABLET IN THE EVENING  Dispense: 270 tablet; Refill: 1  2. Anxiety Stable - FLUoxetine (PROZAC) 20 MG capsule; TAKE 1 CAPSULE (20 MG TOTAL) BY MOUTH DAILY.  Dispense: 90 capsule; Refill: 1 - hydrOXYzine (ATARAX/VISTARIL) 25 MG tablet; Take 1 tablet (25 mg total) by mouth 3 (three) times daily as needed (Increase night time dose 50 mg).  Dispense: 270 tablet; Refill: 1  3. Essential hypertension Controlled Counseled on blood pressure goal of less than 130/80, low-sodium, DASH diet, medication compliance, 150 minutes of moderate intensity exercise per week. Discussed medication compliance, adverse effects. - Basic Metabolic Panel - amLODipine (NORVASC) 5 MG tablet; Take 1 tablet (5 mg total) by mouth daily.  Dispense: 90 tablet; Refill: 1  4. Encounter for screening mammogram for malignant neoplasm of breast - MM 3D SCREEN BREAST BILATERAL; Future  5. Palpitation EKG is reassuring Last thyroid panel from 02/2021 was normal If symptoms persist I will refer her to cardiology for an event monitor - CBC with Differential/Platelet  6. Screening for colon cancer - Ambulatory referral to Gastroenterology    No orders of the defined types were placed in this encounter.   Return in about 6 months (around 12/07/2021) for Chronic medical conditions.       Hoy Register, MD, FAAFP. [facility] [place], [place] [phone]   06/09/2021, 8:48 AM

## 2021-06-09 NOTE — Patient Instructions (Signed)
Palpitations °Palpitations are feelings that your heartbeat is irregular or is faster than normal. It may feel like your heart is fluttering or skipping a beat. Palpitations may be caused by many things, including smoking, caffeine, alcohol, stress, and certain medicines or drugs. Most causes of palpitations are not serious.  °However, some palpitations can be a sign of a serious problem. Further tests and a thorough medical history will be done to find the cause of your palpitations. Your provider may order tests such as an ECG, labs, an echocardiogram, or an ambulatory continuous ECG monitor. °Follow these instructions at home: °Pay attention to any changes in your symptoms. Let your health care provider know about them. Take these actions to help manage your symptoms: °Eating and drinking °Follow instructions from your health care provider about eating or drinking restrictions. You may need to avoid foods and drinks that may cause palpitations. These may include: °Caffeinated coffee, tea, soft drinks, and energy drinks. °Chocolate. °Alcohol. °Diet pills. °Lifestyle °  °Take steps to reduce your stress and anxiety. Things that can help you relax include: °Yoga. °Mind-body activities, such as deep breathing, meditation, or using words and images to create positive thoughts (guided imagery). °Physical activity, such as swimming, jogging, or walking. Tell your health care provider if your palpitations increase with activity. If you have chest pain or shortness of breath with activity, do not continue the activity until you are seen by your health care provider. °Biofeedback. This is a method that helps you learn to use your mind to control things in your body, such as your heartbeat. °Get plenty of rest and sleep. Keep a regular bed time. °Do not use drugs, including cocaine or ecstasy. Do not use marijuana. °Do not use any products that contain nicotine or tobacco. These products include cigarettes, chewing tobacco,  and vaping devices, such as e-cigarettes. If you need help quitting, ask your health care provider. °General instructions °Take over-the-counter and prescription medicines only as told by your health care provider. °Keep all follow-up visits. This is important. These may include visits for further testing if palpitations do not go away or get worse. °Contact a health care provider if: °You continue to have a fast or irregular heartbeat for a long period of time. °You notice that your palpitations occur more often. °Get help right away if: °You have chest pain or shortness of breath. °You have a severe headache. °You feel dizzy or you faint. °These symptoms may represent a serious problem that is an emergency. Do not wait to see if the symptoms will go away. Get medical help right away. Call your local emergency services (911 in the [place] Do not drive yourself to the hospital. °Summary °Palpitations are feelings that your heartbeat is irregular or is faster than normal. It may feel like your heart is fluttering or skipping a beat. °Palpitations may be caused by many things, including smoking, caffeine, alcohol, stress, certain medicines, and drugs. °Further tests and a thorough medical history may be done to find the cause of your palpitations. °Get help right away if you faint or have chest pain, shortness of breath, severe headache, or dizziness. °This information is not intended to replace advice given to you by your health care provider. Make sure you discuss any questions you have with your health care provider. °Document Revised: 12/01/2020 Document Reviewed: 12/01/2020 °Elsevier Patient Education © 2022 [employer] ° °

## 2021-06-10 LAB — CBC WITH DIFFERENTIAL/PLATELET
Basophils Absolute: 0 10*3/uL (ref 0.0–0.2)
Basos: 0 %
EOS (ABSOLUTE): 0.1 10*3/uL (ref 0.0–0.4)
Eos: 2 %
Hematocrit: 40.9 % (ref 34.0–46.6)
Hemoglobin: 12.8 g/dL (ref 11.1–15.9)
Immature Grans (Abs): 0 10*3/uL (ref 0.0–0.1)
Immature Granulocytes: 0 %
Lymphocytes Absolute: 1.5 10*3/uL (ref 0.7–3.1)
Lymphs: 30 %
MCH: 26.6 pg (ref 26.6–33.0)
MCHC: 31.3 g/dL — ABNORMAL LOW (ref 31.5–35.7)
MCV: 85 fL (ref 79–97)
Monocytes Absolute: 0.5 10*3/uL (ref 0.1–0.9)
Monocytes: 10 %
Neutrophils Absolute: 2.8 10*3/uL (ref 1.4–7.0)
Neutrophils: 58 %
Platelets: 296 10*3/uL (ref 150–450)
RBC: 4.81 x10E6/uL (ref 3.77–5.28)
RDW: 12.8 % (ref 11.7–15.4)
WBC: 4.9 10*3/uL (ref 3.4–10.8)

## 2021-06-10 LAB — BASIC METABOLIC PANEL
BUN/Creatinine Ratio: 11 (ref 9–23)
BUN: 11 mg/dL (ref 6–24)
CO2: 20 mmol/L (ref 20–29)
Calcium: 9.7 mg/dL (ref 8.7–10.2)
Chloride: 112 mmol/L — ABNORMAL HIGH (ref 96–106)
Creatinine, Ser: 0.96 mg/dL (ref 0.57–1.00)
Glucose: 95 mg/dL (ref 70–99)
Potassium: 4.2 mmol/L (ref 3.5–5.2)
Sodium: 144 mmol/L (ref 134–144)
eGFR: 69 mL/min/{1.73_m2} (ref >59)

## 2021-06-21 ENCOUNTER — Other Ambulatory Visit: Payer: Self-pay

## 2021-06-22 ENCOUNTER — Other Ambulatory Visit: Payer: Self-pay

## 2021-07-25 ENCOUNTER — Other Ambulatory Visit: Payer: Self-pay

## 2021-08-31 ENCOUNTER — Other Ambulatory Visit: Payer: Self-pay

## 2021-09-30 ENCOUNTER — Other Ambulatory Visit: Payer: Self-pay

## 2021-11-02 ENCOUNTER — Other Ambulatory Visit: Payer: Self-pay

## 2021-11-03 ENCOUNTER — Other Ambulatory Visit: Payer: Self-pay

## 2021-11-08 ENCOUNTER — Other Ambulatory Visit: Payer: Self-pay

## 2021-11-16 ENCOUNTER — Encounter: Payer: Self-pay | Admitting: Family Medicine

## 2021-12-02 ENCOUNTER — Other Ambulatory Visit: Payer: Self-pay

## 2021-12-05 ENCOUNTER — Other Ambulatory Visit: Payer: Self-pay

## 2021-12-06 ENCOUNTER — Other Ambulatory Visit: Payer: Self-pay

## 2021-12-06 ENCOUNTER — Ambulatory Visit: Payer: Managed Care, Other (non HMO) | Attending: Family Medicine | Admitting: Family Medicine

## 2021-12-06 ENCOUNTER — Encounter: Payer: Self-pay | Admitting: Family Medicine

## 2021-12-06 VITALS — BP 112/77 | HR 70 | Ht 67.0 in | Wt 252.8 lb

## 2021-12-06 DIAGNOSIS — I10 Essential (primary) hypertension: Secondary | ICD-10-CM

## 2021-12-06 DIAGNOSIS — Z23 Encounter for immunization: Secondary | ICD-10-CM

## 2021-12-06 DIAGNOSIS — R002 Palpitations: Secondary | ICD-10-CM

## 2021-12-06 DIAGNOSIS — G5603 Carpal tunnel syndrome, bilateral upper limbs: Secondary | ICD-10-CM

## 2021-12-06 DIAGNOSIS — Z9189 Other specified personal risk factors, not elsewhere classified: Secondary | ICD-10-CM

## 2021-12-06 DIAGNOSIS — Z131 Encounter for screening for diabetes mellitus: Secondary | ICD-10-CM

## 2021-12-06 DIAGNOSIS — F419 Anxiety disorder, unspecified: Secondary | ICD-10-CM

## 2021-12-06 DIAGNOSIS — G43709 Chronic migraine without aura, not intractable, without status migrainosus: Secondary | ICD-10-CM

## 2021-12-06 LAB — POCT GLYCOSYLATED HEMOGLOBIN (HGB A1C): Hemoglobin A1C: 5.3 % (ref 4.0–5.6)

## 2021-12-06 MED ORDER — AMLODIPINE BESYLATE 5 MG PO TABS
5.0000 mg | ORAL_TABLET | Freq: Every day | ORAL | 1 refills | Status: DC
  Filled 2021-12-06: qty 90, 90d supply, fill #0
  Filled 2022-01-05: qty 30, 30d supply, fill #0
  Filled 2022-02-01: qty 30, 30d supply, fill #1
  Filled 2022-03-06: qty 90, 90d supply, fill #2
  Filled 2022-06-02: qty 30, 30d supply, fill #3

## 2021-12-06 MED ORDER — ATORVASTATIN CALCIUM 20 MG PO TABS
20.0000 mg | ORAL_TABLET | Freq: Every day | ORAL | 1 refills | Status: DC
  Filled 2021-12-06: qty 90, 90d supply, fill #0

## 2021-12-06 MED ORDER — GABAPENTIN 300 MG PO CAPS
300.0000 mg | ORAL_CAPSULE | Freq: Every day | ORAL | 0 refills | Status: DC
  Filled 2021-12-06: qty 90, 90d supply, fill #0

## 2021-12-06 MED ORDER — FLUOXETINE HCL 20 MG PO CAPS
ORAL_CAPSULE | Freq: Every day | ORAL | 1 refills | Status: DC
  Filled 2021-12-06: qty 90, fill #0
  Filled 2022-01-05: qty 30, 30d supply, fill #0
  Filled 2022-02-01: qty 30, 30d supply, fill #1
  Filled 2022-03-06: qty 90, 90d supply, fill #2
  Filled 2022-06-02: qty 30, 30d supply, fill #3

## 2021-12-06 MED ORDER — TOPIRAMATE 100 MG PO TABS
ORAL_TABLET | ORAL | 1 refills | Status: DC
Start: 2021-12-06 — End: 2022-08-17
  Filled 2021-12-06: qty 270, fill #0
  Filled 2022-05-19: qty 270, 90d supply, fill #0
  Filled 2022-08-03: qty 90, 30d supply, fill #1

## 2021-12-06 NOTE — Patient Instructions (Signed)
[facility]  [address] [place] [address]  ?Ph# [phone] Fax #[phone]. ?

## 2021-12-06 NOTE — Progress Notes (Signed)
? ?Subjective:  ?Patient ID: Monique Tyler, female    DOB: [birth year]  Age: 56 y.o. MRN: [id number] ? ?CC: Palpitations ? ? ?HPI ?Monique Tyler is a 56 y.o. year old female with a history of chronic migraines, anxiety, right shoulder pain status post rotator cuff surgery in 07/2016, seasonal allergies, OSA (uses CPAP at night), migraines here for follow-up. ? ?Interval History: ?She still has her heart thumping fast when she lifts something heavy and this occurs when she lays on her L side or when she places her heavy purse on either of her arms. ?At her last visit an EKG was done which was unrevealing.  She has no chest pain or dyspnea. ?Of note she has gained 12 pounds in the last 8 months. ? ?Exercise tolerance stress test in 2019 revealed: ?         Blood pressure demonstrated a hypertensive response to exercise. ?Upsloping ST segment depression ST segment depression was noted during stress in the II, III, aVF, V4, V6 and V5 leads. ?Mildly diminished exercise capacity. ?Negative, adequate stress test. ? 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  Her migraines are controlled and seasonal allergies are stable. ?She is tolerating antihypertensive therapy well.  Currently does not exercise. ?Denies additional concerns at this time. ?Past Medical History:  ?Diagnosis Date  ? GERD (gastroesophageal reflux disease)   ? Hyperlipidemia   ? Hypertension   ? pt denies a hx of HTN  ? Kidney stones   ? Lung nodules   ? Migraine   ? Vertigo   ? ? ?Past Surgical History:  ?Procedure Laterality Date  ? CESAREAN SECTION    ? MANDIBLE SURGERY    ? TUBAL LIGATION    ? ? ?Family History  ?Problem Relation Age of Onset  ? Hypertension Daughter   ? Hypertension Son   ? ? ?Social History  ? ?Socioeconomic History  ? Marital status: Married  ?  Spouse name: Not on file  ? Number of children: 7  ? Years of education: Not on file  ? Highest education  level: Not on file  ?Occupational History  ? Not on file  ?Tobacco Use  ? Smoking status: Former  ?  Types: Cigarettes  ? Smokeless tobacco: Never  ?Vaping Use  ? Vaping Use: Never used  ?Substance and Sexual Activity  ? Alcohol use: No  ?  Alcohol/week: 0.0 standard drinks  ? Drug use: No  ? Sexual activity: Yes  ?  Birth control/protection: Surgical  ?Other Topics Concern  ? Not on file  ?Social History Narrative  ? Not on file  ? ?Social Determinants of Health  ? ?Financial Resource Strain: Not on file  ?Food Insecurity: Not on file  ?Transportation Needs: Not on file  ?Physical Activity: Not on file  ?Stress: Not on file  ?Social Connections: Not on file  ? ? ?Allergies  ?Allergen Reactions  ? Shellfish Allergy Swelling  ? Pork-Derived Products Nausea And Vomiting  ? ? ?Outpatient Medications Prior to Visit  ?Medication Sig Dispense Refill  ? albuterol (VENTOLIN HFA) 108 (90 Base) MCG/ACT inhaler Inhale 2 puffs into the lungs every 4 (four) hours as needed for wheezing or shortness of breath. 18 g 1  ? Ascorbic Acid (VITAMIN C) 1000 MG tablet Take 1,000 mg by mouth daily.    ? cetirizine (ZYRTEC) 10 MG tablet Take 1 tablet (10 mg total) by  mouth daily. 30 tablet 6  ? Cholecalciferol (VITAMIN D) 50 MCG (2000 UT) CAPS Take 1 capsule (2,000 Units total) by mouth daily. 90 capsule 1  ? fluticasone (FLONASE) 50 MCG/ACT nasal spray Place 2 sprays into both nostrils daily. 16 g 1  ? hydrOXYzine (ATARAX/VISTARIL) 25 MG tablet Take 1 tablet (25 mg total) by mouth 3 (three) times daily as needed (Increase night time dose 50 mg). 270 tablet 1  ? amLODipine (NORVASC) 5 MG tablet Take 1 tablet (5 mg total) by mouth daily. 90 tablet 1  ? atorvastatin (LIPITOR) 20 MG tablet Take 1 tablet (20 mg total) by mouth daily. 90 tablet 1  ? FLUoxetine (PROZAC) 20 MG capsule TAKE 1 CAPSULE (20 MG TOTAL) BY MOUTH DAILY. 90 capsule 1  ? gabapentin (NEURONTIN) 300 MG capsule Take 1 capsule (300 mg total) by mouth at bedtime. 90 capsule 0   ? topiramate (TOPAMAX) 100 MG tablet TAKE 2 TABLETS BY MOUTH IN THE MORNING AND THEN TAKE 1 TABLET IN THE EVENING 270 tablet 1  ? lidocaine (LIDODERM) 5 % Apply 1 patch topically 1 (one) time each day for 15 days. Remove & discard patch within 12 hours or as directed by MD. (Patient not taking: Reported on 12/06/2021) 15 patch 0  ? meloxicam (MOBIC) 7.5 MG tablet Take 1 tablet (7.5 mg total) by mouth 2 (two) times daily as needed. (Patient not taking: Reported on 12/06/2021) 45 tablet 1  ? methocarbamol (ROBAXIN) 500 MG tablet Take 2 tablets (1,000 mg total) by mouth every 8 (eight) hours as needed for muscle spasms. (Patient not taking: Reported on 12/06/2021) 90 tablet 0  ? traMADol (ULTRAM) 50 MG tablet Take 1 tablet (50 mg total) by mouth every 6 (six) hours if needed for severe pain for up to 5 days. (Patient not taking: Reported on 12/06/2021) 15 tablet 0  ? ?No facility-administered medications prior to visit.  ? ? ? ?ROS ?Review of Systems  ?Constitutional:  Negative for activity change, appetite change and fatigue.  ?HENT:  Negative for congestion, sinus pressure and sore throat.   ?Eyes:  Negative for visual disturbance.  ?Respiratory:  Negative for cough, chest tightness, shortness of breath and wheezing.   ?Cardiovascular:  Positive for palpitations. Negative for chest pain.  ?Gastrointestinal:  Negative for abdominal distention, abdominal pain and constipation.  ?Endocrine: Negative for polydipsia.  ?Genitourinary:  Negative for dysuria and frequency.  ?Musculoskeletal:  Negative for arthralgias and back pain.  ?Skin:  Negative for rash.  ?Neurological:  Negative for tremors, light-headedness and numbness.  ?Hematological:  Does not bruise/bleed easily.  ?Psychiatric/Behavioral:  Negative for agitation and behavioral problems.   ? ?Objective:  ?BP 112/77   Pulse 70   Ht 5\' 7"  (1.702 m)   Wt 252 lb 12.8 oz (114.7 kg)   LMP 01/18/2012   SpO2 97%   BMI 39.59 kg/m?  ? ? ?  12/06/2021  ?  8:43 AM  06/09/2021  ?  8:44 AM 03/14/2021  ?  8:48 AM  ?BP/Weight  ?Systolic BP [id number]   ?Diastolic BP 77 85   ?Wt. (Lbs) 252.8 245.6 240  ?BMI 39.59 kg/m2 38.47 kg/m2 37.59 kg/m2  ? ? ? ? ?Physical Exam ?Constitutional:   ?   Appearance: She is well-developed. She is obese.  ?Cardiovascular:  ?   Rate and Rhythm: Normal rate.  ?   Heart sounds: Normal heart sounds. No murmur heard. ?Pulmonary:  ?   Effort: Pulmonary effort is normal.  ?  Breath sounds: Normal breath sounds. No wheezing or rales.  ?Chest:  ?   Chest wall: No tenderness.  ?Abdominal:  ?   General: Bowel sounds are normal. There is no distension.  ?   Palpations: Abdomen is soft. There is no mass.  ?   Tenderness: There is no abdominal tenderness.  ?Musculoskeletal:     ?   General: Normal range of motion.  ?   Right lower leg: No edema.  ?   Left lower leg: No edema.  ?Neurological:  ?   Mental Status: She is alert and oriented to person, place, and time.  ?Psychiatric:     ?   Mood and Affect: Mood normal.  ? ? ? ?  Latest Ref Rng & Units 06/09/2021  ?  9:38 AM 12/03/2020  ?  8:59 AM 02/04/2020  ?  9:54 AM  ?CMP  ?Glucose 70 - 99 mg/dL 95   98   94    ?BUN 6 - 24 mg/dL 11   13   13     ?Creatinine 0.57 - 1.00 mg/dL 0.96   1.09   1.09    ?Sodium 134 - 144 mmol/L 144   142   141    ?Potassium 3.5 - 5.2 mmol/L 4.2   4.2   4.6    ?Chloride 96 - 106 mmol/L 112   109   109    ?CO2 20 - 29 mmol/L 20   19   21     ?Calcium 8.7 - 10.2 mg/dL 9.7   9.2   9.4    ?Total Protein 6.0 - 8.5 g/dL  6.5   6.5    ?Total Bilirubin 0.0 - 1.2 mg/dL  0.3   0.2    ?Alkaline Phos 44 - 121 IU/L  146   143    ?AST 0 - 40 IU/L  20   15    ?ALT 0 - 32 IU/L  20   14    ? ? ?Lipid Panel  ?   ?Component Value Date/Time  ? CHOL 219 (H) 12/03/2020 0859  ? TRIG 136 12/03/2020 0859  ? HDL 41 12/03/2020 0859  ? CHOLHDL 5.3 (H) 12/03/2020 0859  ? CHOLHDL 4.1 05/03/2011 0519  ? VLDL 16 05/03/2011 0519  ? LDLCALC 153 (H) 12/03/2020 0859  ? ? ?CBC ?   ?Component Value Date/Time  ? WBC 4.9 06/09/2021  0938  ? WBC 4.6 09/02/2019 1331  ? RBC 4.81 06/09/2021 0938  ? RBC 4.82 09/02/2019 1331  ? HGB 12.8 06/09/2021 0938  ? HCT 40.9 06/09/2021 0938  ? PLT 296 06/09/2021 0938  ? MCV 85 06/09/2021 0938  ? MCH 26.6 06/09/2021 09

## 2021-12-07 ENCOUNTER — Ambulatory Visit (INDEPENDENT_AMBULATORY_CARE_PROVIDER_SITE_OTHER): Payer: Self-pay | Admitting: Internal Medicine

## 2021-12-07 ENCOUNTER — Other Ambulatory Visit: Payer: Self-pay | Admitting: Family Medicine

## 2021-12-07 ENCOUNTER — Other Ambulatory Visit: Payer: Self-pay

## 2021-12-07 ENCOUNTER — Encounter: Payer: Self-pay | Admitting: Internal Medicine

## 2021-12-07 VITALS — BP 122/76 | HR 60 | Ht 67.0 in | Wt 252.0 lb

## 2021-12-07 DIAGNOSIS — Z9189 Other specified personal risk factors, not elsewhere classified: Secondary | ICD-10-CM

## 2021-12-07 DIAGNOSIS — R0602 Shortness of breath: Secondary | ICD-10-CM

## 2021-12-07 LAB — CMP14+EGFR
ALT: 20 IU/L (ref 0–32)
AST: 20 IU/L (ref 0–40)
Albumin/Globulin Ratio: 1.9 (ref 1.2–2.2)
Albumin: 4.2 g/dL (ref 3.8–4.9)
Alkaline Phosphatase: 142 IU/L — ABNORMAL HIGH (ref 44–121)
BUN/Creatinine Ratio: 10 (ref 9–23)
BUN: 10 mg/dL (ref 6–24)
Bilirubin Total: 0.3 mg/dL (ref 0.0–1.2)
CO2: 17 mmol/L — ABNORMAL LOW (ref 20–29)
Calcium: 9.2 mg/dL (ref 8.7–10.2)
Chloride: 109 mmol/L — ABNORMAL HIGH (ref 96–106)
Creatinine, Ser: 1.05 mg/dL — ABNORMAL HIGH (ref 0.57–1.00)
Globulin, Total: 2.2 g/dL (ref 1.5–4.5)
Glucose: 100 mg/dL — ABNORMAL HIGH (ref 70–99)
Potassium: 4.4 mmol/L (ref 3.5–5.2)
Sodium: 141 mmol/L (ref 134–144)
Total Protein: 6.4 g/dL (ref 6.0–8.5)
eGFR: 62 mL/min/{1.73_m2} (ref >59)

## 2021-12-07 LAB — LP+NON-HDL CHOLESTEROL
Cholesterol, Total: 206 mg/dL — ABNORMAL HIGH (ref 100–199)
HDL: 42 mg/dL (ref >39)
LDL Chol Calc (NIH): 141 mg/dL — ABNORMAL HIGH (ref 0–99)
Total Non-HDL-Chol (LDL+VLDL): 164 mg/dL — ABNORMAL HIGH (ref 0–129)
Triglycerides: 129 mg/dL (ref 0–149)
VLDL Cholesterol Cal: 23 mg/dL (ref 5–40)

## 2021-12-07 MED ORDER — ATORVASTATIN CALCIUM 40 MG PO TABS
40.0000 mg | ORAL_TABLET | Freq: Every day | ORAL | 1 refills | Status: DC
  Filled 2021-12-07 – 2022-03-06 (×3): qty 90, 90d supply, fill #0
  Filled 2022-06-02: qty 30, 30d supply, fill #1
  Filled 2022-07-03: qty 30, 30d supply, fill #2
  Filled 2022-08-03: qty 30, 30d supply, fill #3

## 2021-12-07 NOTE — Progress Notes (Signed)
?Cardiology Office Note:   ? ?Date:  12/07/2021  ? ?ID:  Monique Tyler, DOB 01/24/1966, MRN [id number] ? ?PCP:  Hoy Register, MD ?  ?[facility] Providers ?Cardiologist:  None    ? ?Referring MD: Hoy Register, MD  ? ?No chief complaint on file. ?Palpitations/SOB ? ?History of Present Illness:   ? ?Jamilla Galli is a 56 y.o. female with a hx of GERD, OSA on cpap, asthma referral for palpitations/SOB ? ?She notes SOB and heart pounding with stairs. She's had fatigue. She's concerning about heart pounding. She notes this with carrying a backpack. No wheezing. She has the same symptoms with 10 stairs. No syncope. No chest pressure ? ?Blood pressure is in good control. ? ?Saw Dr. Jens Som in 2019 for chest pain. Testing not indicative of significant coronary disease. ? ?Cardiology Studies ?TTE- normal LV function, no valve disease ?ETT- inferolateral ST changes; upsloping ST depression. BP up to 191/91 mmHg, 6.3 METS (diminished capacity). 85% MPHR, intermediate risk ? ?Past Medical History:  ?Diagnosis Date  ? GERD (gastroesophageal reflux disease)   ? Hyperlipidemia   ? Hypertension   ? pt denies a hx of HTN  ? Kidney stones   ? Lung nodules   ? Migraine   ? Vertigo   ? ? ?Past Surgical History:  ?Procedure Laterality Date  ? CESAREAN SECTION    ? MANDIBLE SURGERY    ? TUBAL LIGATION    ? ? ?Current Medications: ?Current Outpatient Medications on File Prior to Visit  ?Medication Sig Dispense Refill  ? albuterol (VENTOLIN HFA) 108 (90 Base) MCG/ACT inhaler Inhale 2 puffs into the lungs every 4 (four) hours as needed for wheezing or shortness of breath. 18 g 1  ? amLODipine (NORVASC) 5 MG tablet Take 1 tablet (5 mg total) by mouth daily. 90 tablet 1  ? Ascorbic Acid (VITAMIN C) 1000 MG tablet Take 1,000 mg by mouth daily.    ? cetirizine (ZYRTEC) 10 MG tablet Take 1 tablet (10 mg total) by mouth daily. 30 tablet 6  ? Cholecalciferol (VITAMIN D) 50 MCG (2000 UT) CAPS Take 1 capsule (2,000 Units total) by mouth  daily. 90 capsule 1  ? FLUoxetine (PROZAC) 20 MG capsule TAKE 1 CAPSULE (20 MG TOTAL) BY MOUTH DAILY. 90 capsule 1  ? fluticasone (FLONASE) 50 MCG/ACT nasal spray Place 2 sprays into both nostrils daily. 16 g 1  ? gabapentin (NEURONTIN) 300 MG capsule Take 1 capsule (300 mg total) by mouth at bedtime. 90 capsule 0  ? hydrOXYzine (ATARAX/VISTARIL) 25 MG tablet Take 1 tablet (25 mg total) by mouth 3 (three) times daily as needed (Increase night time dose 50 mg). 270 tablet 1  ? topiramate (TOPAMAX) 100 MG tablet TAKE 2 TABLETS BY MOUTH IN THE MORNING AND THEN TAKE 1 TABLET IN THE EVENING 270 tablet 1  ? ?No current facility-administered medications on file prior to visit.  ? ? ? ?Allergies:   Shellfish allergy and Pork-derived products  ? ?Social History  ? ?Socioeconomic History  ? Marital status: Married  ?  Spouse name: Not on file  ? Number of children: 7  ? Years of education: Not on file  ? Highest education level: Not on file  ?Occupational History  ? Not on file  ?Tobacco Use  ? Smoking status: Former  ?  Types: Cigarettes  ? Smokeless tobacco: Never  ?Vaping Use  ? Vaping Use: Never used  ?Substance and Sexual Activity  ? Alcohol use: No  ?  Alcohol/week:  0.0 standard drinks  ? Drug use: No  ? Sexual activity: Yes  ?  Birth control/protection: Surgical  ?Other Topics Concern  ? Not on file  ?Social History Narrative  ? Not on file  ? ?Social Determinants of Health  ? ?Financial Resource Strain: Not on file  ?Food Insecurity: Not on file  ?Transportation Needs: Not on file  ?Physical Activity: Not on file  ?Stress: Not on file  ?Social Connections: Not on file  ?  ? ?Family History: ?The patient's family history includes Hypertension in her daughter and son. No premature CAD. Son has hypertension, he's on dialysis  ? ?ROS:   ?Please see the history of present illness.    ? All other systems reviewed and are negative. ? ?EKGs/Labs/Other Studies Reviewed:   ? ?The following studies were reviewed today: ? ? ?EKG:   EKG is  ordered today.  The ekg ordered today demonstrates  ? ?12/07/2021-NSR ? ?Recent Labs: ?03/10/2021: TSH 2.240 ?06/09/2021: Hemoglobin 12.8; Platelets 296 ?12/06/2021: ALT 20; BUN 10; Creatinine, Ser 1.05; Potassium 4.4; Sodium 141  ? ? ?Recent Lipid Panel ?   ?Component Value Date/Time  ? CHOL 206 (H) 12/06/2021 40980928  ? TRIG 129 12/06/2021 0928  ? HDL 42 12/06/2021 0928  ? CHOLHDL 5.3 (H) 12/03/2020 0859  ? CHOLHDL 4.1 05/03/2011 0519  ? VLDL 16 05/03/2011 0519  ? LDLCALC 141 (H) 12/06/2021 [id number]  ? ? ? ?Risk Assessment/Calculations:   ?  ? ?    ? ?Physical Exam:   ? ?VS:   ? ?Vitals:  ? 12/07/21 1331  ?BP: 122/76  ?Pulse: 60  ?SpO2: 97%  ? ? ? ?LMP 01/18/2012    ? ?Wt Readings from Last 3 Encounters:  ?12/06/21 252 lb 12.8 oz (114.7 kg)  ?06/09/21 245 lb 9.6 oz (111.4 kg)  ?03/14/21 240 lb (108.9 kg)  ?  ? ?GEN:  Well nourished, well developed in no acute distress ?HEENT: Normal ?NECK: No JVD; No carotid bruits ?LYMPHATICS: No lymphadenopathy ?CARDIAC: RRR, no murmurs, rubs, gallops ?RESPIRATORY:  Clear to auscultation without rales, wheezing or rhonchi  ?ABDOMEN: Soft, non-tender, non-distended ?MUSCULOSKELETAL:  No edema; No deformity  ?SKIN: Warm and dry ?NEUROLOGIC:  Alert and oriented x 3 ?PSYCHIATRIC:  Normal affect  ? ?ASSESSMENT:   ? ?SOB:  She describes dyspnea on exertion. Has poor exercise capacity. With risk of obesity and former smoker, will get lexiscan stress. No wheezing. She does have asthma, can consider PFTs if lexiscan is normal. Also discussed that deconditioning can play a role ? ?Low concern for arrhythmia. She does not have high risk features including syncope c/f arrhythmia , family hx of SCD, or abnormalities on her EKGs.    ? ? ?PLAN:   ? ?In order of problems listed above: ? ?Lexiscan stress nuclear ?Follow up depending on the results ? ?   ? ?Shared Decision Making/Informed Consent ?The risks [chest pain, shortness of breath, cardiac arrhythmias, dizziness, blood pressure  fluctuations, myocardial infarction, stroke/transient ischemic attack, nausea, vomiting, allergic reaction, radiation exposure, metallic taste sensation and life-threatening complications (estimated to be 1 in 10,000)], benefits (risk stratification, diagnosing coronary artery disease, treatment guidance) and alternatives of a nuclear stress test were discussed in detail with Ms. Vivanco and she agrees to proceed.  ? ? ?Medication Adjustments/Labs and Tests Ordered: ?Current medicines are reviewed at length with the patient today.  Concerns regarding medicines are outlined above.  ?No orders of the defined types were placed in this encounter. ? ?No orders  of the defined types were placed in this encounter. ? ? ?There are no Patient Instructions on file for this visit.  ? ?Signed, ?Maisie Fus, MD  ?12/07/2021 8:52 AM    ?[facility] ?

## 2021-12-07 NOTE — Patient Instructions (Signed)
Medication Instructions:  ?Your physician recommends that you continue on your current medications as directed. Please refer to the Current Medication list given to you today. ? ?Testing/Procedures: ?Your physician has requested that you have a Scientist, physiological at [facility]. For further information please visit [email or web address] Please follow instruction sheet, as given.  ? ?How to prepare for your Myocardial Perfusion Test: ?Do not eat or drink 3 hours prior to your test, except you may have water. ?Do not consume products containing caffeine (regular or decaffeinated) 12 hours prior to your test. (ex: coffee, chocolate, sodas, tea). ?Do bring a list of your current medications with you.  If not listed below, you may take your medications as normal. ?Do wear comfortable clothes (no dresses or overalls) and walking shoes, tennis shoes preferred (No heels or open toe shoes are allowed). ?Do NOT wear cologne, perfume, aftershave, or lotions (deodorant is allowed). ?The test will take approximately 3 to 4 hours to complete ?If these instructions are not followed, your test will have to be rescheduled. ? ?Follow-Up: ?At [facility], you and your health needs are our priority.  As part of our continuing mission to provide you with exceptional heart care, we have created designated Provider Care Teams.  These Care Teams include your primary Cardiologist (physician) and Advanced Practice Providers (APPs -  Physician Assistants and Nurse Practitioners) who all work together to provide you with the care you need, when you need it. ? ?We recommend signing up for the patient portal called "MyChart".  Sign up information is provided on this After Visit Summary.  MyChart is used to connect with patients for Virtual Visits (Telemedicine).  Patients are able to view lab/test results, encounter notes, upcoming appointments, etc.  Non-urgent messages can be sent to your provider as well.   ?To learn more about what you can  do with MyChart, go to [email or web address].   ? ?Your next appointment:   ?AS NEEDED with Dr. Wyline Mood  ? ? ? ?Important Information About Sugar ? ? ? ? ? ? ?

## 2021-12-12 ENCOUNTER — Ambulatory Visit (HOSPITAL_COMMUNITY): Payer: Self-pay | Attending: Internal Medicine

## 2021-12-12 DIAGNOSIS — R0602 Shortness of breath: Secondary | ICD-10-CM | POA: Insufficient documentation

## 2021-12-12 MED ORDER — REGADENOSON 0.4 MG/5ML IV SOLN
0.4000 mg | Freq: Once | INTRAVENOUS | Status: AC
  Administered 2021-12-12: 0.4 mg via INTRAVENOUS

## 2021-12-12 MED ORDER — TECHNETIUM TC 99M TETROFOSMIN IV KIT
32.7000 | PACK | Freq: Once | INTRAVENOUS | Status: AC | PRN
  Administered 2021-12-12: 32.7 via INTRAVENOUS

## 2021-12-13 ENCOUNTER — Ambulatory Visit (HOSPITAL_COMMUNITY): Payer: Self-pay | Attending: Cardiovascular Disease

## 2021-12-13 LAB — MYOCARDIAL PERFUSION IMAGING
LV dias vol: 68 mL (ref 46–106)
LV sys vol: 21 mL
Nuc Stress EF: 69 %
Peak HR: 88 {beats}/min
Rest HR: 70 {beats}/min
Rest Nuclear Isotope Dose: 32.6 mCi
SDS: 4
SRS: 1
SSS: 5
ST Depression (mm): 0 mm
Stress Nuclear Isotope Dose: 32.7 mCi
TID: 1.01

## 2021-12-13 MED ORDER — TECHNETIUM TC 99M TETROFOSMIN IV KIT
32.6000 | PACK | Freq: Once | INTRAVENOUS | Status: AC | PRN
  Administered 2021-12-13: 32.6 via INTRAVENOUS

## 2021-12-14 ENCOUNTER — Other Ambulatory Visit: Payer: Self-pay

## 2022-01-05 ENCOUNTER — Other Ambulatory Visit: Payer: Self-pay

## 2022-02-01 ENCOUNTER — Other Ambulatory Visit: Payer: Self-pay

## 2022-02-13 ENCOUNTER — Encounter: Payer: Self-pay | Admitting: Family Medicine

## 2022-02-13 ENCOUNTER — Other Ambulatory Visit (HOSPITAL_COMMUNITY)
Admission: RE | Admit: 2022-02-13 | Discharge: 2022-02-13 | Disposition: A | Discharge status: Home / Self Care | Payer: Self-pay | Source: Ambulatory Visit | Attending: Family Medicine | Admitting: Family Medicine

## 2022-02-13 ENCOUNTER — Other Ambulatory Visit: Payer: Self-pay

## 2022-02-13 ENCOUNTER — Ambulatory Visit: Payer: Self-pay | Attending: Family Medicine | Admitting: Family Medicine

## 2022-02-13 VITALS — BP 119/81 | HR 76 | Temp 98.3°F | Ht 67.0 in | Wt 256.0 lb

## 2022-02-13 DIAGNOSIS — Z23 Encounter for immunization: Secondary | ICD-10-CM

## 2022-02-13 DIAGNOSIS — G8929 Other chronic pain: Secondary | ICD-10-CM

## 2022-02-13 DIAGNOSIS — Z124 Encounter for screening for malignant neoplasm of cervix: Secondary | ICD-10-CM

## 2022-02-13 DIAGNOSIS — Z0000 Encounter for general adult medical examination without abnormal findings: Secondary | ICD-10-CM

## 2022-02-13 DIAGNOSIS — M25561 Pain in right knee: Secondary | ICD-10-CM

## 2022-02-13 DIAGNOSIS — Z1231 Encounter for screening mammogram for malignant neoplasm of breast: Secondary | ICD-10-CM

## 2022-02-13 DIAGNOSIS — Z1211 Encounter for screening for malignant neoplasm of colon: Secondary | ICD-10-CM

## 2022-02-13 MED ORDER — PREDNISONE 20 MG PO TABS
20.0000 mg | ORAL_TABLET | Freq: Every day | ORAL | 0 refills | Status: DC
  Filled 2022-02-13 – 2022-02-22 (×2): qty 5, 5d supply, fill #0

## 2022-02-13 MED ORDER — MELOXICAM 7.5 MG PO TABS
7.5000 mg | ORAL_TABLET | Freq: Every day | ORAL | 1 refills | Status: DC
  Filled 2022-02-13 – 2022-02-22 (×2): qty 30, 30d supply, fill #0

## 2022-02-13 NOTE — Progress Notes (Signed)
Right knee pain

## 2022-02-13 NOTE — Patient Instructions (Signed)
Chronic Knee Pain, Adult Chronic knee pain is pain in one or both knees that lasts longer than 3 months. Symptoms of chronic knee pain may include swelling, stiffness, and discomfort. Age-related wear and tear (osteoarthritis) of the knee joint is the most common cause of chronic knee pain. Other possible causes include: A long-term immune-related disease that causes inflammation of the knee (rheumatoid arthritis). This usually affects both knees. Inflammatory arthritis, such as gout or pseudogout. An injury to the knee that causes arthritis. An injury to the knee that damages the ligaments. Ligaments are strong tissues that connect bones to each other. Runner's knee or pain behind the kneecap. Treatment for chronic knee pain depends on the cause. The main treatments for chronic knee pain are physical therapy and weight loss. This condition may also be treated with medicines, injections, a knee sleeve or brace, and by using crutches. Rest, ice, pressure (compression), and elevation, also known as RICE therapy, may also be recommended. Follow these instructions at home: If you have a knee sleeve or brace:  Wear the knee sleeve or brace as told by your health care provider. Remove it only as told by your health care provider. Loosen it if your toes tingle, become numb, or turn cold and blue. Keep it clean. If the sleeve or brace is not waterproof: Do not let it get wet. Remove it if allowed by your health care provider, or cover it with a watertight covering when you take a bath or a shower. Managing pain, stiffness, and swelling     If directed, apply heat to the affected area as often as told by your health care provider. Use the heat source that your health care provider recommends, such as a moist heat pack or a heating pad. If you have a removable knee sleeve or brace, remove it as told by your health care provider. Place a towel between your skin and the heat source. Leave the heat on for  20-30 minutes. Remove the heat if your skin turns bright red. This is especially important if you are unable to feel pain, heat, or cold. You may have a greater risk of getting burned. If directed, put ice on the affected area. To do this: If you have a removable knee sleeve or brace, remove it as told by your health care provider. Put ice in a plastic bag. Place a towel between your skin and the bag. Leave the ice on for 20 minutes, 2-3 times a day. Remove the ice if your skin turns bright red. This is very important. If you cannot feel pain, heat, or cold, you have a greater risk of damage to the area. Move your toes often to reduce stiffness and swelling. Raise (elevate) the injured area above the level of your heart while you are sitting or lying down. Activity Avoid high-impact activities or exercises, such as running, jumping rope, or doing jumping jacks. Follow the exercise plan that your health care provider designed for you. Your health care provider may suggest that you: Avoid activities that make knee pain worse. This may require you to change your exercise routines, sport participation, or job duties. Wear shoes with cushioned soles. Avoid sports that require running and sudden changes in direction. Do physical therapy. Physical therapy is planned to match your needs and abilities. It may include exercises for strength, flexibility, stability, and endurance. Do exercises that increase balance and strength, such as tai chi and yoga. Do not use the injured limb to support your   body weight until your health care provider says that you can. Use crutches as told by your health care provider. Return to your normal activities as told by your health care provider. Ask your health care provider what activities are safe for you. General instructions Take over-the-counter and prescription medicines only as told by your health care provider. Lose weight if you are overweight. Losing even a  little weight can reduce knee pain. Ask your health care provider what your ideal weight is, and how to safely lose extra weight. A dietitian may be able to help you plan your meals. Do not use any products that contain nicotine or tobacco, such as cigarettes, e-cigarettes, and chewing tobacco. These can delay healing. If you need help quitting, ask your health care provider. Keep all follow-up visits. This is important. Contact a health care provider if: You have knee pain that is not getting better or gets worse. You are unable to do your physical therapy exercises due to knee pain. Get help right away if: Your knee swells and the swelling becomes worse. You cannot move your knee. You have severe knee pain. Summary Knee pain that lasts more than 3 months is considered chronic knee pain. The main treatments for chronic knee pain are physical therapy and weight loss. You may also need to take medicines, wear a knee sleeve or brace, use crutches, and apply ice or heat. Losing even a little weight can reduce knee pain. Ask your health care provider what your ideal weight is, and how to safely lose extra weight. A dietitian may be able to help you plan your meals. Follow the exercise plan that your health care provider designed for you. This information is not intended to replace advice given to you by your health care provider. Make sure you discuss any questions you have with your health care provider. Document Revised: 12/24/2019 Document Reviewed: 12/24/2019 Else[employer] Patient Education  [employer].

## 2022-02-13 NOTE — Progress Notes (Signed)
Subjective:  Patient ID: Monique Tyler, female    DOB: Dec 01, 1965  Age: 56 y.o. MRN: [id number]  CC: Gynecologic Exam   HPI Micaylah Himmelreich is a 56 y.o. year old female with a history of chronic migraines, anxiety, right shoulder pain status post rotator cuff surgery in 07/2016, seasonal allergies, OSA (uses CPAP at night), migraines  She is due for colorectal cancer screening, cervical cancer screening and breast cancer screening.  Interval History: Her right knee has been hurting. A couple of months ago she slipped and fell at work and sought medical attention at the time.  X-rays were unremarkable except for presence of spurs.  It gives way when she walks and she has to hold onto the railing when going up and down stairs. She uses Ibuprofen for pain. Past Medical History:  Diagnosis Date   GERD (gastroesophageal reflux disease)    Hyperlipidemia    Hypertension    pt denies a hx of HTN   Kidney stones    Lung nodules    Migraine    Vertigo     Past Surgical History:  Procedure Laterality Date   CESAREAN SECTION     MANDIBLE SURGERY     TUBAL LIGATION      Family History  Problem Relation Age of Onset   Hypertension Daughter    Hypertension Son     Social History   Socioeconomic History   Marital status: Married    Spouse name: Not on file   Number of children: 7   Years of education: Not on file   Highest education level: Not on file  Occupational History   Not on file  Tobacco Use   Smoking status: Former    Types: Cigarettes    Passive exposure: Never   Smokeless tobacco: Never  Vaping Use   Vaping Use: Never used  Substance and Sexual Activity   Alcohol use: No    Alcohol/week: 0.0 standard drinks of alcohol   Drug use: No   Sexual activity: Yes    Birth control/protection: Surgical  Other Topics Concern   Not on file  Social History Narrative   Not on file   Social Determinants of Health   Financial Resource Strain: Not on file  Food  Insecurity: Not on file  Transportation Needs: Not on file  Physical Activity: Not on file  Stress: Not on file  Social Connections: Not on file    Allergies  Allergen Reactions   Shellfish Allergy Swelling   Pork-Derived Products Nausea And Vomiting    Outpatient Medications Prior to Visit  Medication Sig Dispense Refill   albuterol (VENTOLIN HFA) 108 (90 Base) MCG/ACT inhaler Inhale 2 puffs into the lungs every 4 (four) hours as needed for wheezing or shortness of breath. 18 g 1   amLODipine (NORVASC) 5 MG tablet Take 1 tablet (5 mg total) by mouth once daily. 90 tablet 1   Ascorbic Acid (VITAMIN C) 1000 MG tablet Take 1,000 mg by mouth daily.     atorvastatin (LIPITOR) 40 MG tablet Take 1 tablet (40 mg total) by mouth daily. 90 tablet 1   cetirizine (ZYRTEC) 10 MG tablet Take 1 tablet (10 mg total) by mouth daily. 30 tablet 6   Cholecalciferol (VITAMIN D) 50 MCG (2000 UT) CAPS Take 1 capsule (2,000 Units total) by mouth daily. 90 capsule 1   FLUoxetine (PROZAC) 20 MG capsule TAKE 1 CAPSULE (20 MG TOTAL) BY MOUTH ONCE DAILY. 90 capsule 1   fluticasone (FLONASE) 50  MCG/ACT nasal spray Place 2 sprays into both nostrils daily. 16 g 1   gabapentin (NEURONTIN) 300 MG capsule Take 1 capsule (300 mg total) by mouth at bedtime. 90 capsule 0   hydrOXYzine (ATARAX/VISTARIL) 25 MG tablet Take 1 tablet (25 mg total) by mouth 3 (three) times daily as needed (Increase night time dose 50 mg). 270 tablet 1   topiramate (TOPAMAX) 100 MG tablet TAKE 2 TABLETS BY MOUTH IN THE MORNING AND THEN TAKE 1 TABLET IN THE EVENING 270 tablet 1   No facility-administered medications prior to visit.     ROS Review of Systems  Constitutional:  Negative for activity change, appetite change and fatigue.  HENT:  Negative for congestion, sinus pressure and sore throat.   Eyes:  Negative for visual disturbance.  Respiratory:  Negative for cough, chest tightness, shortness of breath and wheezing.   Cardiovascular:   Negative for chest pain and palpitations.  Gastrointestinal:  Negative for abdominal distention, abdominal pain and constipation.  Endocrine: Negative for polydipsia.  Genitourinary:  Negative for dysuria and frequency.  Musculoskeletal:        See HPI  Skin:  Negative for rash.  Neurological:  Negative for tremors, light-headedness and numbness.  Hematological:  Does not bruise/bleed easily.  Psychiatric/Behavioral:  Negative for agitation and behavioral problems.     Objective:  BP 119/81   Pulse 76   Temp 98.3 F (36.8 C) (Oral)   Ht 5\' 7"  (1.702 m)   Wt 256 lb (116.1 kg)   LMP 01/18/2012   SpO2 98%   BMI 40.10 kg/m      02/13/2022    9:00 AM 12/12/2021    8:24 AM 12/07/2021    1:31 PM  BP/Weight  Systolic BP 119  12/09/2021  Diastolic BP 81  76  Wt. (Lbs) 256 252 252  BMI 40.1 kg/m2 39.47 kg/m2 39.47 kg/m2      Physical Exam Exam conducted with a chaperone present.  Constitutional:      General: She is not in acute distress.    Appearance: She is well-developed. She is not diaphoretic.  HENT:     Head: Normocephalic.     Right Ear: External ear normal.     Left Ear: External ear normal.     Nose: Nose normal.  Eyes:     Conjunctiva/sclera: Conjunctivae normal.     Pupils: Pupils are equal, round, and reactive to light.  Neck:     Vascular: No JVD.  Cardiovascular:     Rate and Rhythm: Normal rate and regular rhythm.     Heart sounds: Normal heart sounds. No murmur heard.    No gallop.  Pulmonary:     Effort: Pulmonary effort is normal. No respiratory distress.     Breath sounds: Normal breath sounds. No wheezing or rales.  Chest:     Chest wall: No tenderness.  Breasts:    Right: Normal. No mass, nipple discharge or tenderness.     Left: Normal. No mass, nipple discharge or tenderness.  Abdominal:     General: Bowel sounds are normal. There is no distension.     Palpations: Abdomen is soft. There is no mass.     Tenderness: There is no abdominal  tenderness.     Hernia: There is no hernia in the left inguinal area or right inguinal area.  Genitourinary:    General: Normal vulva.     Pubic Area: No rash.      Labia:  Right: No rash.        Left: No rash.      Vagina: Normal.     Cervix: Normal.     Uterus: Normal.      Adnexa: Right adnexa normal and left adnexa normal.       Right: No tenderness.         Left: No tenderness.    Musculoskeletal:        General: No tenderness.     Cervical back: Normal range of motion. No tenderness.     Comments: Normal appearance of both knees Tenderness in anterior knee joint on flexion and extension of right knee  Lymphadenopathy:     Upper Body:     Right upper body: No supraclavicular or axillary adenopathy.     Left upper body: No supraclavicular or axillary adenopathy.  Skin:    General: Skin is warm and dry.  Neurological:     Mental Status: She is alert and oriented to person, place, and time.     Deep Tendon Reflexes: Reflexes are normal and symmetric.        Latest Ref Rng & Units 12/06/2021    9:28 AM 06/09/2021    9:38 AM 12/03/2020    8:59 AM  CMP  Glucose 70 - 99 mg/dL 299  95  98   BUN 6 - 24 mg/dL 10  11  13    Creatinine 0.57 - 1.00 mg/dL  3.71  6.96   Sodium 134 - 144 mmol/L 141  144  142   Potassium 3.5 - 5.2 mmol/L 4.4  4.2  4.2   Chloride 96 - 106 mmol/L 109  112  109   CO2 20 - 29 mmol/L 17  20  19    Calcium 8.7 - 10.2 mg/dL 9.2  9.7  9.2   Total Protein 6.0 - 8.5 g/dL 6.4   6.5   Total Bilirubin 0.0 - 1.2 mg/dL 0.3   0.3   Alkaline Phos 44 - 121 IU/L 142   146   AST 0 - 40 IU/L 20   20   ALT 0 - 32 IU/L 20   20     Lipid Panel     Component Value Date/Time   CHOL 206 (H) 12/06/2021 0928   TRIG 129 12/06/2021 0928   HDL 42 12/06/2021 0928   CHOLHDL 5.3 (H) 12/03/2020 0859   CHOLHDL 4.1 05/03/2011 0519   VLDL 16 05/03/2011 0519   LDLCALC 141 (H) 12/06/2021 0928    CBC    Component Value Date/Time   WBC 4.9 06/09/2021 0938   WBC  4.6 09/02/2019 1331   RBC 4.81 06/09/2021 0938   RBC 4.82 09/02/2019 1331   HGB 12.8 06/09/2021 0938   HCT 40.9 06/09/2021 0938   PLT 296 06/09/2021 0938   MCV 85 06/09/2021 0938   MCH 26.6 06/09/2021 0938   MCH 27.0 09/02/2019 1331   MCHC 31.3 (L) 06/09/2021 0938   MCHC 30.6 09/02/2019 1331   RDW 12.8 06/09/2021 0938   LYMPHSABS 1.5 06/09/2021 0938   MONOABS 0.8 06/25/2017 1801   EOSABS 0.1 06/09/2021 0938   BASOSABS 0.0 06/09/2021 0938    Lab Results  Component Value Date   HGBA1C 5.3 12/06/2021    Assessment & Plan:  1. Annual physical exam Counseled on 150 minutes of exercise per week, healthy eating (including decreased daily intake of saturated fats, cholesterol, added sugars, sodium), routine healthcare maintenance.   2. Chronic pain of right knee  Uncontrolled Could be signs of early osteoarthritis Short course of steroids followed by NSAID Referral for PT and consider orthopedics for cortisone injection if symptoms persist Weight loss will be beneficial - meloxicam (MOBIC) 7.5 MG tablet; Take 1 tablet (7.5 mg total) by mouth daily.  Dispense: 30 tablet; Refill: 1 - predniSONE (DELTASONE) 20 MG tablet; Take 1 tablet (20 mg total) by mouth daily with breakfast.  Dispense: 5 tablet; Refill: 0 - Ambulatory referral to Physical Therapy  3. Screening for cervical cancer - Cytology - PAP  4. Encounter for screening mammogram for malignant neoplasm of breast - MM DIAG BREAST TOMO BILATERAL; Future  5. Screening for colon cancer - Ambulatory referral to Gastroenterology  6. Need for shingles vaccine 2nd dose of shingles shot administered.    Meds ordered this encounter  Medications   meloxicam (MOBIC) 7.5 MG tablet    Sig: Take 1 tablet (7.5 mg total) by mouth daily.    Dispense:  30 tablet    Refill:  1   predniSONE (DELTASONE) 20 MG tablet    Sig: Take 1 tablet (20 mg total) by mouth daily with breakfast.    Dispense:  5 tablet    Refill:  0     Follow-up: Return in about 6 months (around 08/16/2022) for Chronic medical conditions.       Charlott Rakes, MD, FAAFP. [facility] and [place], [place]   02/13/2022, 9:40 AM

## 2022-02-15 LAB — CYTOLOGY - PAP
Comment: NEGATIVE
Diagnosis: NEGATIVE
High risk HPV: NEGATIVE

## 2022-02-17 ENCOUNTER — Other Ambulatory Visit: Payer: Self-pay

## 2022-02-22 ENCOUNTER — Other Ambulatory Visit: Payer: Self-pay

## 2022-02-28 ENCOUNTER — Other Ambulatory Visit: Payer: Self-pay

## 2022-02-28 ENCOUNTER — Encounter (HOSPITAL_BASED_OUTPATIENT_CLINIC_OR_DEPARTMENT_OTHER): Payer: Self-pay

## 2022-02-28 ENCOUNTER — Emergency Department (HOSPITAL_BASED_OUTPATIENT_CLINIC_OR_DEPARTMENT_OTHER): Payer: Self-pay

## 2022-02-28 ENCOUNTER — Emergency Department (HOSPITAL_BASED_OUTPATIENT_CLINIC_OR_DEPARTMENT_OTHER)
Admission: EM | Admit: 2022-02-28 | Discharge: 2022-02-28 | Disposition: A | Discharge status: Home / Self Care | Payer: Self-pay | Attending: Emergency Medicine | Admitting: Emergency Medicine

## 2022-02-28 DIAGNOSIS — M79604 Pain in right leg: Secondary | ICD-10-CM | POA: Insufficient documentation

## 2022-02-28 DIAGNOSIS — M25561 Pain in right knee: Secondary | ICD-10-CM | POA: Insufficient documentation

## 2022-02-28 DIAGNOSIS — M79671 Pain in right foot: Secondary | ICD-10-CM | POA: Insufficient documentation

## 2022-02-28 DIAGNOSIS — Z79899 Other long term (current) drug therapy: Secondary | ICD-10-CM | POA: Insufficient documentation

## 2022-02-28 MED ORDER — OXYCODONE-ACETAMINOPHEN 5-325 MG PO TABS
1.0000 | ORAL_TABLET | Freq: Once | ORAL | Status: AC
  Administered 2022-02-28: 1 via ORAL
  Filled 2022-02-28: qty 1

## 2022-02-28 MED ORDER — OXYCODONE HCL 5 MG PO TABS
5.0000 mg | ORAL_TABLET | Freq: Four times a day (QID) | ORAL | 0 refills | Status: DC | PRN
  Filled 2022-02-28: qty 10, 3d supply, fill #0

## 2022-02-28 NOTE — ED Provider Notes (Signed)
[facility] EMERGENCY DEPT Provider Note   CSN: [id number] Arrival date & time: 02/28/22  0706     History  Chief Complaint  Patient presents with   Knee Pain    Monique Tyler is a 56 y.o. female presenting to the emergency department with complaint of right knee and heel pain.  She reports she had a fall several months ago and has been dealing with pain in her hips, and low back since then.  It has been waxing and waning.  However about 1 to 2 weeks ago the pain flared up again in her right knee, and also feels like it travels down into her right heel.  It is worse with bearing weight.  Her doctor put her on meloxicam and steroids, does not relieve the pain.  She is having difficulty sleeping.  She denies any recent falls or trauma.  Per my review of external records, the patient was seen by Dr. Jordan Likes sports medicine in Aug 2022, noted to have left hip pain, is an effusion of the right knee.  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  She did have an effusion of the right knee at that time.  HPI     Home Medications Prior to Admission medications   Medication Sig Start Date End Date Taking? Authorizing Provider  albuterol (VENTOLIN HFA) 108 (90 Base) MCG/ACT inhaler Inhale 2 puffs into the lungs every 4 (four) hours as needed for wheezing or shortness of breath. 11/30/20   Hoy Register, MD  amLODipine (NORVASC) 5 MG tablet Take 1 tablet (5 mg total) by mouth once daily. 12/06/21   Hoy Register, MD  Ascorbic Acid (VITAMIN C) 1000 MG tablet Take 1,000 mg by mouth daily.    [provider]  atorvastatin (LIPITOR) 40 MG tablet Take 1 tablet (40 mg total) by mouth daily. 12/07/21   Hoy Register, MD  cetirizine (ZYRTEC) 10 MG tablet Take 1 tablet (10 mg total) by mouth daily. 11/03/19   Hoy Register, MD  Cholecalciferol (VITAMIN D) 50 MCG (2000 UT) CAPS Take 1 capsule (2,000 Units total) by mouth  daily. 03/16/21   Anders Simmonds, PA-C  FLUoxetine (PROZAC) 20 MG capsule TAKE 1 CAPSULE (20 MG TOTAL) BY MOUTH ONCE DAILY. 12/06/21   Hoy Register, MD  fluticasone (FLONASE) 50 MCG/ACT nasal spray Place 2 sprays into both nostrils daily. 11/03/19   Hoy Register, MD  gabapentin (NEURONTIN) 300 MG capsule Take 1 capsule (300 mg total) by mouth at bedtime. 12/06/21   Hoy Register, MD  hydrOXYzine (ATARAX/VISTARIL) 25 MG tablet Take 1 tablet (25 mg total) by mouth 3 (three) times daily as needed (Increase night time dose 50 mg). 06/09/21   Hoy Register, MD  meloxicam (MOBIC) 7.5 MG tablet Take 1 tablet (7.5 mg total) by mouth daily. 02/13/22   Hoy Register, MD  oxyCODONE (ROXICODONE) 5 MG immediate release tablet Take 1 tablet (5 mg total) by mouth every 6 (six) hours as needed for up to 10 doses for severe pain. 02/28/22  Yes Terald Sleeper, MD  predniSONE (DELTASONE) 20 MG tablet Take 1 tablet (20 mg total) by mouth daily with breakfast. 02/13/22   Hoy Register, MD  topiramate (TOPAMAX) 100 MG tablet TAKE 2 TABLETS BY MOUTH IN THE MORNING AND THEN TAKE 1 TABLET IN THE EVENING 12/06/21 12/06/22  Hoy Register, MD      Allergies    Shellfish allergy and Pork-derived products    Review of  Systems   Review of Systems  Physical Exam Updated Vital Signs BP 135/79 (BP Location: Right Arm)   Pulse 72   Temp 98.5 F (36.9 C) (Oral)   Resp 14   Ht 5\' 7"  (1.702 m)   Wt 116.1 kg   LMP 01/18/2012   SpO2 96%   BMI 40.10 kg/m  Physical Exam Constitutional:      General: She is not in acute distress. HENT:     Head: Normocephalic and atraumatic.  Eyes:     Conjunctiva/sclera: Conjunctivae normal.     Pupils: Pupils are equal, round, and reactive to light.  Cardiovascular:     Rate and Rhythm: Normal rate and regular rhythm.  Pulmonary:     Effort: Pulmonary effort is normal. No respiratory distress.  Musculoskeletal:     Comments: Patient has a small right peripatellar  effusion, which is not warm, is able to flex and extend the leg at the right knee, does have tenderness on palpation of the effusion. She has posterior right calf tenderness without visible swelling. Achilles tendon appears intact with full range of motion at the ankle. Patient is able to fully plantarflex and dorsiflex the right ankle with limited discomfort.  Skin:    General: Skin is warm and dry.  Neurological:     General: No focal deficit present.     Mental Status: She is alert. Mental status is at baseline.  Psychiatric:        Mood and Affect: Mood normal.        Behavior: Behavior normal.     ED Results / Procedures / Treatments   Labs (all labs ordered are listed, but only abnormal results are displayed) Labs Reviewed - No data to display  EKG None  Radiology 01/20/2012 Venous Img Lower Unilateral Right  Result Date: 02/28/2022 CLINICAL DATA:  Right calf pain for 8 months. EXAM: RIGHT LOWER EXTREMITY VENOUS DOPPLER ULTRASOUND TECHNIQUE: Gray-scale sonography with graded compression, as well as color Doppler and duplex ultrasound were performed to evaluate the lower extremity deep venous systems from the level of the common femoral vein and including the common femoral, femoral, profunda femoral, popliteal and calf veins including the posterior tibial, peroneal and gastrocnemius veins when visible. The superficial great saphenous vein was also interrogated. Spectral Doppler was utilized to evaluate flow at rest and with distal augmentation maneuvers in the common femoral, femoral and popliteal veins. COMPARISON:  03/14/2021 FINDINGS: Contralateral Common Femoral Vein: Respiratory phasicity is normal and symmetric with the symptomatic side. No evidence of thrombus. Normal compressibility. Common Femoral Vein: No evidence of thrombus. Normal compressibility, respiratory phasicity and response to augmentation. Saphenofemoral Junction: No evidence of thrombus. Normal compressibility and flow  on color Doppler imaging. Profunda Femoral Vein: No evidence of thrombus. Normal compressibility and flow on color Doppler imaging. Femoral Vein: No evidence of thrombus. Normal compressibility, respiratory phasicity and response to augmentation. Popliteal Vein: No evidence of thrombus. Normal compressibility, respiratory phasicity and response to augmentation. Calf Veins: No evidence of thrombus. Normal compressibility and flow on color Doppler imaging. Superficial Great Saphenous Vein: No evidence of thrombus. Normal compressibility. Other Findings:  None. IMPRESSION: Negative for deep venous thrombosis in right lower extremity. Electronically Signed   By: 03/16/2021 M.D.   On: 02/28/2022 09:18    Procedures Procedures    Medications Ordered in ED Medications  oxyCODONE-acetaminophen (PERCOCET/ROXICET) 5-325 MG per tablet 1 tablet (1 tablet Oral Given 02/28/22 0805)    ED Course/ Medical Decision Making/ A&P  Clinical Course as of 02/28/22 0930  Tue Feb 28, 2022  0920 DVT ultrasound is negative for DVT [MT]  0930 Patient's pain is improved to is requesting a knee brace and crutches, which we will provide, I explained the immobilizer is only for her comfort, she does not need to wear it at all time, she can be weightbearing as tolerated per my workup here.  I am hoping with several days of rest, ice and elevation, the pain and swelling will improve in her leg.  However, I emphasized the need for follow-up with her sports medicine doctor [MT]    Clinical Course User Index [MT] Shrita Thien, Kermit Balo, MD                           Medical Decision Making Risk Prescription drug management.   Differential diagnosis would include peripheral neuropathy versus noninfectious right joint effusion versus chronic meniscus or right knee ligament injury versus DVT versus other.  Because she has had this ongoing pain for months and has never had DVT ultrasound, I think is reasonable to obtain this at this  time.  She does have right posterior calf tenderness.  I personally ordered and reviewed her ultrasound imaging, showing no acute DVT  The patient has already tried and failed anti-inflammatory medications with steroids and meloxicam.  She can continue NSAIDs, but I also recommended elevation and ice.  Over the immediate short-term we discussed a short prescription of opioid medication, such as Percocet, 8 to 10 tablets to help her sleep and very through the next few days.  She is able to take the time off to work and will rest and elevate her leg.  I will refer her back to the sports medicine physician who saw her several months ago.  The very low suspicion for septic joint or traumatic injury or fracture.  Do not believe she needs arthrocentesis emergently or repeat x-ray imaging or films of the lower extremities.  I doubt acute vascular occlusion.  I personally reviewed her external records as noted in history above. Reviewed PDMP.        Final Clinical Impression(s) / ED Diagnoses Final diagnoses:  Right leg pain    Rx / DC Orders ED Discharge Orders          Ordered    oxyCODONE (ROXICODONE) 5 MG immediate release tablet  Every 6 hours PRN        02/28/22 0928              Terald Sleeper, MD 02/28/22 [phone]

## 2022-02-28 NOTE — Discharge Instructions (Addendum)
Please call the sports medicine doctor to schedule another follow-up appointment in the office.  Your ultrasound today did not show any signs of blood clots in your leg.  I would recommend that you try to keep off the right leg is much as possible for the next week to help with the inflammation.  Try to keep the leg elevated to waist level.  You can apply ice packs or cooling packs 10 minutes at a time to your right knee.  You can continue with the meloxicam medication prescribed by your doctor, which is an anti-inflammatory.  You can also take Tylenol.  For more severe pain I prescribed a few tablets of oxycodone, which is an opioid narcotic.  Do not drive, swim, operate heavy machinery after taking narcotics.  Long-term use of opioids has been strongly associated with addiction and dependency.  Try to reserve this medicine only for severe pain.  *  Knee immobilizer was provided for comfort only.  You should take this off when you are showering and in bed at night.  You can wear this when you are walking, if it helps.

## 2022-02-28 NOTE — ED Triage Notes (Signed)
Right knee and right heal pain that has been going on a few weeks. Pt fell about 8 months ago and injured same knee.

## 2022-03-01 ENCOUNTER — Encounter: Payer: Self-pay | Admitting: Family Medicine

## 2022-03-01 ENCOUNTER — Ambulatory Visit (HOSPITAL_BASED_OUTPATIENT_CLINIC_OR_DEPARTMENT_OTHER)
Admission: RE | Admit: 2022-03-01 | Discharge: 2022-03-01 | Disposition: A | Discharge status: Home / Self Care | Payer: Self-pay | Source: Ambulatory Visit | Attending: Family Medicine | Admitting: Family Medicine

## 2022-03-01 ENCOUNTER — Ambulatory Visit (INDEPENDENT_AMBULATORY_CARE_PROVIDER_SITE_OTHER): Payer: Worker's Compensation | Admitting: Family Medicine

## 2022-03-01 ENCOUNTER — Ambulatory Visit: Payer: Self-pay

## 2022-03-01 VITALS — BP 131/72 | Ht 67.0 in | Wt 256.0 lb

## 2022-03-01 DIAGNOSIS — M722 Plantar fascial fibromatosis: Secondary | ICD-10-CM

## 2022-03-01 DIAGNOSIS — S83241D Other tear of medial meniscus, current injury, right knee, subsequent encounter: Secondary | ICD-10-CM

## 2022-03-01 MED ORDER — TRIAMCINOLONE ACETONIDE 40 MG/ML IJ SUSP
40.0000 mg | Freq: Once | INTRAMUSCULAR | Status: AC
  Administered 2022-03-01: 40 mg via INTRA_ARTICULAR

## 2022-03-01 NOTE — Progress Notes (Signed)
  Monique Tyler - 56 y.o. female MRN [id number]  Date of birth: Sep 21, 1965  SUBJECTIVE:  Including CC & ROS.  No chief complaint on file.   Monique Tyler is a 56 y.o. female that is presenting with acute on chronic right knee pain and acute right heel pain.  The knee pain has been ongoing since last year.  She had tried home exercise therapy at that time.  Was recently seen by her primary physician and provided prednisone and sent to physical therapy for the knee.  Was seen in the emergency department yesterday for the knee pain..  Review of the lower extremity duplex of the right leg from 8/8 shows no DVT.   Review of Systems See HPI   HISTORY: Past Medical, Surgical, Social, and Family History Reviewed & Updated per EMR.   Pertinent Historical Findings include:  Past Medical History:  Diagnosis Date   GERD (gastroesophageal reflux disease)    Hyperlipidemia    Hypertension    pt denies a hx of HTN   Kidney stones    Lung nodules    Migraine    Vertigo     Past Surgical History:  Procedure Laterality Date   CESAREAN SECTION     MANDIBLE SURGERY     TUBAL LIGATION       PHYSICAL EXAM:  VS: BP 131/72 (BP Location: Left Arm, Patient Position: Sitting)   Ht 5\' 7"  (1.702 m)   Wt 256 lb (116.1 kg)   LMP 01/18/2012   BMI 40.10 kg/m  Physical Exam Gen: NAD, alert, cooperative with exam, well-appearing MSK:  Neurovascularly intact     Aspiration/Injection Procedure Note Monique Tyler [birth year]  Procedure: Injection Indications: Right knee pain  Procedure Details Consent: Risks of procedure as well as the alternatives and risks of each were explained to the (patient/caregiver).  Consent for procedure obtained. Time Out: Verified patient identification, verified procedure, site/side was marked, verified correct patient position, special equipment/implants available, medications/allergies/relevent history reviewed, required imaging and test results available.  Performed.   The area was cleaned with iodine and alcohol swabs.    The right knee superior lateral suprapatellar pouch was injected using 3 cc of 1% lidocaine on a 21-gauge 2 inch needle.  The syringe was switched to mixture containing 1 cc's of 40 mg Kenalog and 4 cc's of 0.25% bupivacaine was injected.  Ultrasound was used. Images were obtained in long views showing the injection.     A sterile dressing was applied.  Patient did tolerate procedure well.     ASSESSMENT & PLAN:   Acute meniscal tear, medial, right, subsequent encounter Acutely occurring.  Pain has been ongoing since 02/2021 after an injury at work.  X-rays at that time were unrevealing.  Acutely worsening. -Counseled on home exercise therapy and supportive care. -Hinged knee brace. -Injection. -MRI of the right knee to evaluate for meniscal tear and for presurgical planning.  Plantar fasciitis of right foot Acutely having heel pain.  Having pain that is worse with weightbearing and walking. -Counseled on home exercise therapy and supportive care. -X-ray. -Could consider injection.

## 2022-03-01 NOTE — Assessment & Plan Note (Signed)
Acutely having heel pain.  Having pain that is worse with weightbearing and walking. -Counseled on home exercise therapy and supportive care. -X-ray. -Could consider injection.

## 2022-03-01 NOTE — Patient Instructions (Signed)
Good to see you Please use ice as needed You can use the hinged knee brace We will get the MRI at [facility]   I will call with the x-ray results from today. Please send me a message in MyChart with any questions or updates.  We will set up a virtual visit once the MRI is resulted..   --Dr. Jordan Likes

## 2022-03-01 NOTE — Addendum Note (Signed)
Addended by: Merrilyn Puma on: 03/01/2022 02:24 PM   Modules accepted: Orders

## 2022-03-01 NOTE — Assessment & Plan Note (Signed)
Acutely occurring.  Pain has been ongoing since 02/2021 after an injury at work.  X-rays at that time were unrevealing.  Acutely worsening. -Counseled on home exercise therapy and supportive care. -Hinged knee brace. -Injection. -MRI of the right knee to evaluate for meniscal tear and for presurgical planning.

## 2022-03-03 ENCOUNTER — Telehealth: Payer: Self-pay | Admitting: Family Medicine

## 2022-03-03 NOTE — Telephone Encounter (Signed)
Informed of results  Myra Rude, MD [facility] 03/03/2022, 8:14 AM

## 2022-03-06 ENCOUNTER — Other Ambulatory Visit: Payer: Self-pay

## 2022-03-09 ENCOUNTER — Ambulatory Visit
Admission: RE | Admit: 2022-03-09 | Discharge: 2022-03-09 | Disposition: A | Discharge status: Home / Self Care | Payer: Self-pay | Source: Ambulatory Visit | Attending: Family Medicine | Admitting: Family Medicine

## 2022-03-09 DIAGNOSIS — S83241D Other tear of medial meniscus, current injury, right knee, subsequent encounter: Secondary | ICD-10-CM

## 2022-03-11 ENCOUNTER — Ambulatory Visit
Admission: RE | Admit: 2022-03-11 | Discharge: 2022-03-11 | Disposition: A | Discharge status: Home / Self Care | Payer: Self-pay | Source: Ambulatory Visit | Attending: Family Medicine | Admitting: Family Medicine

## 2022-03-14 ENCOUNTER — Telehealth (INDEPENDENT_AMBULATORY_CARE_PROVIDER_SITE_OTHER): Payer: Self-pay | Admitting: Family Medicine

## 2022-03-14 ENCOUNTER — Encounter: Payer: Self-pay | Admitting: Family Medicine

## 2022-03-14 DIAGNOSIS — M1711 Unilateral primary osteoarthritis, right knee: Secondary | ICD-10-CM

## 2022-03-14 NOTE — Progress Notes (Signed)
Virtual Visit via Video Note  I connected with Monique Tyler on 03/14/22 at 10:10 AM EDT by a video enabled telemedicine application and verified that I am speaking with the correct person using two identifiers.  Location: Patient: home Provider: office   I discussed the limitations of evaluation and management by telemedicine and the availability of in person appointments. The patient expressed understanding and agreed to proceed.  History of Present Illness:  Monique Tyler is a 56 year old female that is following up after the MRI of her right knee.  This was demonstrating moderate patellofemoral arthritis with a full-thickness cartilage defect and lateral patellar facet with a joint effusion and small loose bodies in the posterior knee.  She continues to have severe knee pain and unable to bear weight.   Observations/Objective:   Assessment and Plan:  OA of the right knee:  Acutely occurring.  Significant degenerative changes with effusion and loose bodies appreciated in her knee.  She has tried injections with limited improvement.  Continues to have severe pain with weightbearing. - counseled on home exercise therapy and supportive care  - referral to surgeon   Follow Up Instructions:    I discussed the assessment and treatment plan with the patient. The patient was provided an opportunity to ask questions and all were answered. The patient agreed with the plan and demonstrated an understanding of the instructions.   The patient was advised to call back or seek an in-person evaluation if the symptoms worsen or if the condition fails to improve as anticipated.    Clare Gandy, MD

## 2022-03-14 NOTE — Assessment & Plan Note (Signed)
Acutely occurring.  Significant degenerative changes with effusion and loose bodies appreciated in her knee.  She has tried injections with limited improvement.  Continues to have severe pain with weightbearing. - counseled on home exercise therapy and supportive care  - referral to surgeon

## 2022-03-21 ENCOUNTER — Ambulatory Visit (INDEPENDENT_AMBULATORY_CARE_PROVIDER_SITE_OTHER): Payer: Worker's Compensation | Admitting: Orthopaedic Surgery

## 2022-03-21 DIAGNOSIS — M722 Plantar fascial fibromatosis: Secondary | ICD-10-CM | POA: Diagnosis not present

## 2022-03-21 DIAGNOSIS — M1711 Unilateral primary osteoarthritis, right knee: Secondary | ICD-10-CM | POA: Diagnosis not present

## 2022-03-21 MED ORDER — METHYLPREDNISOLONE ACETATE 40 MG/ML IJ SUSP
40.0000 mg | INTRAMUSCULAR | Status: AC | PRN
  Administered 2022-03-21: 40 mg

## 2022-03-21 MED ORDER — LIDOCAINE HCL 1 % IJ SOLN
1.0000 mL | INTRAMUSCULAR | Status: AC | PRN
  Administered 2022-03-21: 1 mL

## 2022-03-21 MED ORDER — BUPIVACAINE HCL 0.5 % IJ SOLN
1.0000 mL | INTRAMUSCULAR | Status: AC | PRN
  Administered 2022-03-21: 1 mL

## 2022-03-21 NOTE — Progress Notes (Signed)
Office Visit Note   Patient: Monique Tyler           Date of Birth: 1966-07-23           MRN: [id number] Visit Date: 03/21/2022              Requested by: Myra Rude, MD [address] [place],  [place] [address] PCP: Hoy Register, MD   Assessment & Plan: Visit Diagnoses:  1. Primary osteoarthritis of right knee   2. Plantar fasciitis of right foot     Plan: Impression is right knee osteoarthritis with significant chondromalacia and right planter fasciitis.  Treatment options were discussed for each condition.  Discussed PT, weight loss, knee brace, Voltaren gel, Synvisc injection, supplements as conservative treatment options for the knee pain.  For the plan fasciitis she elected to undergo cortisone injection today.  We will place her in a boot for immobilization and she can wean after couple weeks when she feels improvement.  We will see her back as needed.  Follow-Up Instructions: No follow-ups on file.   Orders:  Orders Placed This Encounter  Procedures   Foot Inj   No orders of the defined types were placed in this encounter.     Procedures: Foot Inj  Date/Time: 03/21/2022 6:37 PM  Performed by: Tarry Kos, MD Authorized by: Tarry Kos, MD   Consent Given by:  Patient Timeout: prior to procedure the correct patient, procedure, and site was verified   Indications:  Pain Condition: Plantar Fasciitis   Location: right plantar fascia muscle   Prep: patient was prepped and draped in usual sterile fashion   Needle Size:  25 G Medications:  1 mL lidocaine 1 %; 1 mL bupivacaine 0.5 %; 40 mg methylPREDNISolone acetate 40 MG/ML    Clinical Data: No additional findings.   Subjective: Chief Complaint  Patient presents with   Right Knee - Pain    HPI Patient is a pleasant 56 year old female here with her husband for evaluation of chronic right knee pain and right heel pain.  Referral from Dr. Jordan Likes.  Had MRI of the right knee which  showed grade III and IV chondromalacia.  She reports severe pain in her heel to the point that she has had to use a wheelchair the last few days for ambulating long distances.  She is still walking for the most part.  Cortisone injection in the right knee did not help significantly  Review of Systems  Constitutional: Negative.   HENT: Negative.    Eyes: Negative.   Respiratory: Negative.    Cardiovascular: Negative.   Endocrine: Negative.   Musculoskeletal: Negative.   Neurological: Negative.   Hematological: Negative.   Psychiatric/Behavioral: Negative.    All other systems reviewed and are negative.    Objective: Vital Signs: LMP 01/18/2012   Physical Exam Vitals and nursing note reviewed.  Constitutional:      Appearance: She is well-developed.  HENT:     Head: Atraumatic.     Nose: Nose normal.  Eyes:     Extraocular Movements: Extraocular movements intact.  Cardiovascular:     Pulses: Normal pulses.  Pulmonary:     Effort: Pulmonary effort is normal.  Abdominal:     Palpations: Abdomen is soft.  Musculoskeletal:     Cervical back: Neck supple.  Skin:    General: Skin is warm.     Capillary Refill: Capillary refill takes less than 2 seconds.  Neurological:  Mental Status: She is alert. Mental status is at baseline.  Psychiatric:        Behavior: Behavior normal.        Thought Content: Thought content normal.        Judgment: Judgment normal.     Ortho Exam Examination of the right knee shows no joint effusion.  Slight medial and lateral joint line tenderness.  Crepitus and pain with range of motion.  Examination of right foot shows normal capillary refill.  Normal motor and sensory function.  Tenderness to the plantar aspect of the heel.  Specialty Comments:  No specialty comments available.  Imaging: No results found.   PMFS History: Patient Active Problem List   Diagnosis Date Noted   Primary osteoarthritis of right knee 03/21/2022   Plantar  fasciitis of right foot 03/01/2022   OA (osteoarthritis) of knee 03/14/2021   SI (sacroiliac) joint dysfunction 03/14/2021   Hip impingement syndrome, right 03/14/2021   Mediastinal adenopathy 07/05/2018   Obstructive sleep apnea 06/14/2018   Anxiety 03/15/2018   Status post right rotator cuff repair 02/28/2017   Seasonal allergies 02/28/2017   Migraine 11/30/2015   Benign paroxysmal positional vertigo 11/30/2015   Avitaminosis D 11/28/2011   Past Medical History:  Diagnosis Date   GERD (gastroesophageal reflux disease)    Hyperlipidemia    Hypertension    pt denies a hx of HTN   Kidney stones    Lung nodules    Migraine    Vertigo     Family History  Problem Relation Age of Onset   Hypertension Daughter    Hypertension Son     Past Surgical History:  Procedure Laterality Date   CESAREAN SECTION     MANDIBLE SURGERY     TUBAL LIGATION     Social History   Occupational History   Not on file  Tobacco Use   Smoking status: Former    Types: Cigarettes    Passive exposure: Never   Smokeless tobacco: Never  Vaping Use   Vaping Use: Never used  Substance and Sexual Activity   Alcohol use: No    Alcohol/week: 0.0 standard drinks of alcohol   Drug use: No   Sexual activity: Yes    Birth control/protection: Surgical

## 2022-04-03 ENCOUNTER — Telehealth: Payer: Self-pay | Admitting: Orthopaedic Surgery

## 2022-04-03 NOTE — Telephone Encounter (Signed)
Pt called and states that she is suppose to be having surgery with Dr.XU. Pt is very frustrated because she is unable to speak with Debi because she does not have a surgery sheet. Can we please get that to her or relay some making some kind of info to the pt?   Cb [phone]

## 2022-04-03 NOTE — Telephone Encounter (Signed)
I remember seeing this patient and she was more concerned about her foot pain at that time and she wasn't remotely interested in surgery for the knee.  If she is interested at this time, then she should make a follow up appointment to discuss surgery in detail.  Thank you.

## 2022-04-03 NOTE — Telephone Encounter (Signed)
Patient has appointment to discuss total right knee surgery on 04-05-22 @9 :15am.  Patient states she was referred to our office by Dr. and she is self pay (uninsured) but is [employer] comp.

## 2022-04-05 ENCOUNTER — Ambulatory Visit (INDEPENDENT_AMBULATORY_CARE_PROVIDER_SITE_OTHER): Payer: Worker's Compensation | Admitting: Orthopaedic Surgery

## 2022-04-05 ENCOUNTER — Telehealth: Payer: Self-pay | Admitting: Orthopaedic Surgery

## 2022-04-05 ENCOUNTER — Encounter: Payer: Self-pay | Admitting: Orthopaedic Surgery

## 2022-04-05 VITALS — Ht 67.5 in | Wt 269.0 lb

## 2022-04-05 DIAGNOSIS — M1711 Unilateral primary osteoarthritis, right knee: Secondary | ICD-10-CM

## 2022-04-05 DIAGNOSIS — E66813 Obesity, class 3: Secondary | ICD-10-CM | POA: Insufficient documentation

## 2022-04-05 DIAGNOSIS — Z6841 Body Mass Index (BMI) 40.0 and over, adult: Secondary | ICD-10-CM

## 2022-04-05 NOTE — Telephone Encounter (Signed)
Please put in for gel approval for right knee, thank you

## 2022-04-05 NOTE — Telephone Encounter (Signed)
Is this patient WC?  Would like a gel injection.

## 2022-04-05 NOTE — Progress Notes (Signed)
Office Visit Note   Patient: Monique Tyler           Date of Birth: 1966/05/18           MRN: [id number] Visit Date: 04/05/2022              Requested by: Hoy Register, MD [address] [place],  [place] [address] PCP: Hoy Register, MD   Assessment & Plan: Visit Diagnoses:  1. Primary osteoarthritis of right knee   2. Body mass index 40.0-44.9, adult (HCC)     Plan: Impression is right knee osteoarthritis.  She is experiencing a lot of giving way.  Treatment options were discussed.  We will try an OA reaction brace and hopefully be able to get approval for Visco injection.  Currently patient's BMI is too high for joint replacement.  Goal weight is 250 pounds.  The patient meets the AMA guidelines for Morbid (severe) obesity with a BMI > 40.0 and I have recommended weight loss.  Follow-Up Instructions: No follow-ups on file.   Orders:  No orders of the defined types were placed in this encounter.  No orders of the defined types were placed in this encounter.     Procedures: No procedures performed   Clinical Data: No additional findings.   Subjective: Chief Complaint  Patient presents with   Right Knee - Pain    HPI Monique Tyler returns today for evaluation and treatment of right knee pain.  We saw her recently for planter fasciitis.  She has been experiencing frequent buckling in the right knee.  She received temporary relief from cortisone injection done at Dr. Michaelle Copas office on 03/01/2022.  Review of Systems  Constitutional: Negative.   HENT: Negative.    Eyes: Negative.   Respiratory: Negative.    Cardiovascular: Negative.   Endocrine: Negative.   Musculoskeletal: Negative.   Neurological: Negative.   Hematological: Negative.   Psychiatric/Behavioral: Negative.    All other systems reviewed and are negative.    Objective: Vital Signs: Ht 5' 7.5" (1.715 m)   Wt 269 lb (122 kg)   LMP 01/18/2012   BMI 41.51 kg/m   Physical Exam Vitals  and nursing note reviewed.  Constitutional:      Appearance: She is well-developed.  HENT:     Head: Atraumatic.     Nose: Nose normal.  Eyes:     Extraocular Movements: Extraocular movements intact.  Cardiovascular:     Pulses: Normal pulses.  Pulmonary:     Effort: Pulmonary effort is normal.  Abdominal:     Palpations: Abdomen is soft.  Musculoskeletal:     Cervical back: Neck supple.  Skin:    General: Skin is warm.     Capillary Refill: Capillary refill takes less than 2 seconds.  Neurological:     Mental Status: She is alert and oriented to person, place, and time. Mental status is at baseline.  Psychiatric:        Behavior: Behavior normal.        Thought Content: Thought content normal.        Judgment: Judgment normal.     Ortho Exam Examination right knee shows no joint effusion.  1+ crepitus with range of motion.  Collaterals and cruciates are stable.  Slight joint line tenderness. Specialty Comments:  No specialty comments available.  Imaging: No results found.   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   Mediastinal adenopathy 07/05/2018   Obstructive sleep apnea 06/14/2018   Anxiety 03/15/2018   Status post right rotator cuff repair 02/28/2017   Seasonal allergies 02/28/2017   Migraine 11/30/2015   Benign paroxysmal positional vertigo 11/30/2015   Avitaminosis D 11/28/2011   Past Medical History:  Diagnosis Date   GERD (gastroesophageal reflux disease)    Hyperlipidemia    Hypertension    pt denies a hx of HTN   Kidney stones    Lung nodules    Migraine    Vertigo     Family History  Problem Relation Age  of Onset   Hypertension Daughter    Hypertension Son     Past Surgical History:  Procedure Laterality Date   CESAREAN SECTION     MANDIBLE SURGERY     TUBAL LIGATION     Social History   Occupational History   Not on file  Tobacco Use   Smoking status: Former    Types: Cigarettes    Passive exposure: Never   Smokeless tobacco: Never  Vaping Use   Vaping Use: Never used  Substance and Sexual Activity   Alcohol use: No    Alcohol/week: 0.0 standard drinks of alcohol   Drug use: No   Sexual activity: Yes    Birth control/protection: Surgical

## 2022-04-07 NOTE — Telephone Encounter (Signed)
Talked with patient concerning gel injection and advised her about TriVisc and how she will receive a call from [facility] to pay for injections.  Patient voiced that she understands.  Submitted for TriVisc, right knee.

## 2022-04-11 ENCOUNTER — Ambulatory Visit (INDEPENDENT_AMBULATORY_CARE_PROVIDER_SITE_OTHER): Payer: Worker's Compensation

## 2022-04-11 NOTE — Progress Notes (Signed)
Patient came in to be fitted with a reaction brace on the right leg.

## 2022-04-25 ENCOUNTER — Telehealth: Payer: Self-pay

## 2022-04-25 NOTE — Telephone Encounter (Signed)
Received fax from [facility] stating that they have closed request for TriVisc due to no response from patient.    Reached out to patient on 04/11/2022 and provided her with [facility] phone number.

## 2022-05-19 ENCOUNTER — Other Ambulatory Visit: Payer: Self-pay

## 2022-06-02 ENCOUNTER — Other Ambulatory Visit: Payer: Self-pay

## 2022-07-03 ENCOUNTER — Other Ambulatory Visit: Payer: Self-pay | Admitting: Family Medicine

## 2022-07-03 ENCOUNTER — Other Ambulatory Visit: Payer: Self-pay

## 2022-07-03 DIAGNOSIS — F419 Anxiety disorder, unspecified: Secondary | ICD-10-CM

## 2022-07-03 DIAGNOSIS — I10 Essential (primary) hypertension: Secondary | ICD-10-CM

## 2022-07-03 MED ORDER — FLUOXETINE HCL 20 MG PO CAPS
ORAL_CAPSULE | Freq: Every day | ORAL | 1 refills | Status: DC
  Filled 2022-07-03: qty 30, 30d supply, fill #0
  Filled 2022-08-03: qty 30, 30d supply, fill #1

## 2022-07-03 MED ORDER — AMLODIPINE BESYLATE 5 MG PO TABS
5.0000 mg | ORAL_TABLET | Freq: Every day | ORAL | 1 refills | Status: DC
  Filled 2022-07-03: qty 30, 30d supply, fill #0
  Filled 2022-08-03: qty 30, 30d supply, fill #1

## 2022-07-04 ENCOUNTER — Other Ambulatory Visit: Payer: Self-pay

## 2022-08-03 ENCOUNTER — Other Ambulatory Visit: Payer: Self-pay

## 2022-08-07 ENCOUNTER — Other Ambulatory Visit (HOSPITAL_BASED_OUTPATIENT_CLINIC_OR_DEPARTMENT_OTHER): Payer: Self-pay

## 2022-08-07 ENCOUNTER — Encounter (HOSPITAL_BASED_OUTPATIENT_CLINIC_OR_DEPARTMENT_OTHER): Payer: Self-pay | Admitting: Emergency Medicine

## 2022-08-07 ENCOUNTER — Emergency Department (HOSPITAL_BASED_OUTPATIENT_CLINIC_OR_DEPARTMENT_OTHER)
Admission: EM | Admit: 2022-08-07 | Discharge: 2022-08-07 | Disposition: A | Discharge status: Home / Self Care | Payer: Self-pay | Attending: Emergency Medicine | Admitting: Emergency Medicine

## 2022-08-07 ENCOUNTER — Ambulatory Visit: Payer: Self-pay | Admitting: *Deleted

## 2022-08-07 ENCOUNTER — Emergency Department (HOSPITAL_BASED_OUTPATIENT_CLINIC_OR_DEPARTMENT_OTHER): Payer: Self-pay | Admitting: Radiology

## 2022-08-07 ENCOUNTER — Other Ambulatory Visit: Payer: Self-pay

## 2022-08-07 DIAGNOSIS — U071 COVID-19: Secondary | ICD-10-CM | POA: Insufficient documentation

## 2022-08-07 DIAGNOSIS — R6889 Other general symptoms and signs: Secondary | ICD-10-CM

## 2022-08-07 HISTORY — DX: Unspecified asthma, uncomplicated: J45.909

## 2022-08-07 LAB — RESP PANEL BY RT-PCR (RSV, FLU A&B, COVID)  RVPGX2
Influenza A by PCR: NEGATIVE
Influenza B by PCR: NEGATIVE
Resp Syncytial Virus by PCR: NEGATIVE
SARS Coronavirus 2 by RT PCR: POSITIVE — AB

## 2022-08-07 MED ORDER — BENZONATATE 100 MG PO CAPS
100.0000 mg | ORAL_CAPSULE | Freq: Three times a day (TID) | ORAL | 0 refills | Status: DC
  Filled 2022-08-07: qty 21, 7d supply, fill #0

## 2022-08-07 MED ORDER — AEROCHAMBER PLUS FLO-VU MEDIUM MISC
1.0000 | Freq: Once | Status: DC
  Filled 2022-08-07: qty 10

## 2022-08-07 MED ORDER — IBUPROFEN 600 MG PO TABS
600.0000 mg | ORAL_TABLET | Freq: Four times a day (QID) | ORAL | 0 refills | Status: DC | PRN
  Filled 2022-08-07: qty 30, 8d supply, fill #0

## 2022-08-07 MED ORDER — ALBUTEROL SULFATE HFA 108 (90 BASE) MCG/ACT IN AERS
2.0000 | INHALATION_SPRAY | RESPIRATORY_TRACT | Status: DC | PRN
  Administered 2022-08-07: 2 via RESPIRATORY_TRACT
  Filled 2022-08-07: qty 6.7

## 2022-08-07 MED ORDER — BENZONATATE 100 MG PO CAPS
200.0000 mg | ORAL_CAPSULE | Freq: Once | ORAL | Status: AC
  Administered 2022-08-07: 200 mg via ORAL
  Filled 2022-08-07: qty 2

## 2022-08-07 NOTE — ED Provider Notes (Addendum)
[place] [facility] EMERGENCY DEPT Provider Note   CSN: [id number] Arrival date & time: 08/07/22  1258     History  Chief Complaint  Patient presents with   Shortness of Breath    Monique Tyler is a 57 y.o. female complaining of viral symptoms since Friday.  Reports multiple sick contacts at work.  Most concerned about the shortness of breath because of her congestion and some cough.  Shortness of Breath Associated symptoms: sore throat   Associated symptoms: no vomiting        Home Medications Prior to Admission medications   Medication Sig Start Date End Date Taking? Authorizing Provider  benzonatate (TESSALON) 100 MG capsule Take 1 capsule (100 mg total) by mouth every 8 (eight) hours. 08/07/22  Yes Panagiota Perfetti A, PA-C  ibuprofen (ADVIL) 600 MG tablet Take 1 tablet (600 mg total) by mouth every 6 (six) hours as needed. 08/07/22  Yes Tiyana Galla A, PA-C  albuterol (VENTOLIN HFA) 108 (90 Base) MCG/ACT inhaler Inhale 2 puffs into the lungs every 4 (four) hours as needed for wheezing or shortness of breath. 11/30/20   Charlott Rakes, MD  amLODipine (NORVASC) 5 MG tablet Take 1 tablet (5 mg total) by mouth daily. 07/03/22   Charlott Rakes, MD  Ascorbic Acid (VITAMIN C) 1000 MG tablet Take 1,000 mg by mouth daily.    [provider]  atorvastatin (LIPITOR) 40 MG tablet Take 1 tablet (40 mg total) by mouth daily. 12/07/21   Charlott Rakes, MD  cetirizine (ZYRTEC) 10 MG tablet Take 1 tablet (10 mg total) by mouth daily. 11/03/19   Charlott Rakes, MD  Cholecalciferol (VITAMIN D) 50 MCG (2000 UT) CAPS Take 1 capsule (2,000 Units total) by mouth daily. 03/16/21   Argentina Donovan, PA-C  FLUoxetine (PROZAC) 20 MG capsule TAKE 1 CAPSULE (20 MG TOTAL) BY MOUTH ONCE DAILY. 07/03/22   Charlott Rakes, MD  fluticasone (FLONASE) 50 MCG/ACT nasal spray Place 2 sprays into both nostrils daily. 11/03/19   Charlott Rakes, MD  gabapentin (NEURONTIN) 300 MG capsule Take 1  capsule (300 mg total) by mouth at bedtime. 12/06/21   Charlott Rakes, MD  hydrOXYzine (ATARAX) 25 MG tablet Take 1 tablet (25 mg total) by mouth 3 (three) times daily as needed (Increase night time dose 50 mg). 06/09/21   Charlott Rakes, MD  meloxicam (MOBIC) 7.5 MG tablet Take 1 tablet (7.5 mg total) by mouth daily. 02/13/22   Charlott Rakes, MD  oxyCODONE (ROXICODONE) 5 MG immediate release tablet Take 1 tablet (5 mg total) by mouth every 6 (six) hours as needed for up to 10 doses for severe pain. 02/28/22   Wyvonnia Dusky, MD  predniSONE (DELTASONE) 20 MG tablet Take 1 tablet (20 mg total) by mouth daily with breakfast. 02/13/22   Charlott Rakes, MD  topiramate (TOPAMAX) 100 MG tablet TAKE 2 TABLETS BY MOUTH IN THE MORNING AND THEN TAKE 1 TABLET IN THE EVENING 12/06/21 12/06/22  Charlott Rakes, MD      Allergies    Shellfish allergy and Pork-derived products    Review of Systems   Review of Systems  HENT:  Positive for congestion, postnasal drip, rhinorrhea, sinus pressure and sore throat.   Respiratory:  Positive for shortness of breath.   Gastrointestinal:  Negative for diarrhea, nausea and vomiting.    Physical Exam Updated Vital Signs Ht 5\' 7"  (1.702 m)   Wt 117.9 kg   LMP 01/18/2012   BMI 40.72 kg/m  Physical Exam Vitals and nursing  note reviewed.  Constitutional:      General: She is not in acute distress.    Appearance: Normal appearance. She is ill-appearing.  HENT:     Head: Normocephalic and atraumatic.     Mouth/Throat:     Mouth: Mucous membranes are moist.     Pharynx: Oropharynx is clear.  Eyes:     General: No scleral icterus.    Conjunctiva/sclera: Conjunctivae normal.  Cardiovascular:     Rate and Rhythm: Normal rate and regular rhythm.  Pulmonary:     Effort: Pulmonary effort is normal. No respiratory distress.     Breath sounds: Normal breath sounds.  Musculoskeletal:     Right lower leg: No edema.     Left lower leg: No edema.     Comments:  Ambulatory  Skin:    Findings: No rash.  Neurological:     Mental Status: She is alert.  Psychiatric:        Mood and Affect: Mood normal.     ED Results / Procedures / Treatments   Labs (all labs ordered are listed, but only abnormal results are displayed) Labs Reviewed  RESP PANEL BY RT-PCR (RSV, FLU A&B, COVID)  [id number]    EKG None  Radiology No results found.  Procedures Procedures   Medications Ordered in ED Medications  albuterol (VENTOLIN HFA) 108 (90 Base) MCG/ACT inhaler 2 puff (has no administration in time range)  AeroChamber Plus Flo-Vu Medium MISC 1 each (has no administration in time range)  benzonatate (TESSALON) capsule 200 mg (has no administration in time range)    ED Course/ Medical Decision Making/ A&P                           Medical Decision Making Amount and/or Complexity of Data Reviewed Radiology: ordered.  Risk Prescription drug management.   57 year old female presenting today with URI symptoms.  Symptoms have been going on for 3 days.  X-ray ordered by nursing team.  I viewed and interpreted this and there are no acute findings.  EKG also performed without concerning findings.   On physical exam they are uncomfortable appearing however lung sounds are clear.  Low likelihood Wells PE score.  Viral testing pending.  We discussed that these viral symptoms are something that need to run their course.  They understand that good options include Motrin, Tylenol, DayQuil/NyQuil and other over-the-counter medications for their symptoms.  Return precautions discussed and they understand that they may follow-up on their symptoms outpatient with either PCP or urgent care as needed for nonemergent symptoms.  Agreeable to discharge at this time after treatment of Tessalon.   Final Clinical Impression(s) / ED Diagnoses Final diagnoses:  None    Rx / DC Orders ED Discharge Orders          Ordered    benzonatate (TESSALON) 100 MG capsule  Every 8  hours        08/07/22 1400    ibuprofen (ADVIL) 600 MG tablet  Every 6 hours PRN        08/07/22 1400           Results and diagnoses were explained to the patient. Return precautions discussed in full. Patient had no additional questions and expressed complete understanding.   This chart was dictated using voice recognition software.  Despite best efforts to proofread,  errors can occur which can change the documentation meaning.    Rhae Hammock, PA-C 08/07/22 1404  Saddie Benders, PA-C 08/07/22 1405    Melene Plan, DO 08/07/22 1422

## 2022-08-07 NOTE — Telephone Encounter (Signed)
Summary: cold symptoms   Patient experiencing chest congestion, runny nose, cough for a few days. Patient taking vitamin C and taking a lot of fluids.   Spouse states he was experiencing the same symptoms and he was taking fluticasone (FLONASE) 50 MCG/ACT nasal spray and it helped, spouse would like to know if patient can take his nasal spray.  Also spouse states he plans on taking patient to the mobile unit but seeking clinical advice.          Reason for Disposition  [1] Continuous (nonstop) coughing interferes with work or school AND [2] no improvement using cough treatment per Care Advice  Answer Assessment - Initial Assessment Questions 1. ONSET: "When did the cough begin?"      2 days ago 2. SEVERITY: "How bad is the cough today?"      Same- more frequent 3. SPUTUM: "Describe the color of your sputum" (none, dry cough; clear, white, yellow, green)     no 4. HEMOPTYSIS: "Are you coughing up any blood?" If so ask: "How much?" (flecks, streaks, tablespoons, etc.)     no 5. DIFFICULTY BREATHING: "Are you having difficulty breathing?" If Yes, ask: "How bad is it?" (e.g., mild, moderate, severe)    - MILD: No SOB at rest, mild SOB with walking, speaks normally in sentences, can lie down, no retractions, pulse < 100.    - MODERATE: SOB at rest, SOB with minimal exertion and prefers to sit, cannot lie down flat, speaks in phrases, mild retractions, audible wheezing, pulse 100-120.    - SEVERE: Very SOB at rest, speaks in single words, struggling to breathe, sitting hunched forward, retractions, pulse > 120      Nasal passage blocked, tired 6. FEVER: "Do you have a fever?" If Yes, ask: "What is your temperature, how was it measured, and when did it start?"     Sweating- not hot  10. OTHER SYMPTOMS: "Do you have any other symptoms?" (e.g., runny nose, wheezing, chest pain)       Nasal congestion  12. TRAVEL: "Have you traveled out of the country in the last month?" (e.g., travel  history, exposures)        husband recently sick  Protocols used: Cough - Acute Productive-A-AH

## 2022-08-07 NOTE — Telephone Encounter (Signed)
Noted  

## 2022-08-07 NOTE — Discharge Instructions (Addendum)
You came to the department today due to flulike symptoms.  Your chest x-ray is normal and you will be able to see the results of your swab online.  As we discussed viral illness is something that can be treated with over-the-counter medications like Tylenol and ibuprofen.  Other options include DayQuil/NyQuil, Mucinex for congestion, Robitussin/Delsym for cough and any other over-the-counter medications.  It is very important that you stay hydrated during this time as well.  You may use things like Powerade, Gatorade, electrolyte powders and water.  We hope that you feel better and do not hesitate to return to the emergency department with any worsening symptoms, especially chest pain, shortness of breath, dizziness and loss of consciousness.   Your cough and ibuprofen prescriptions are at your pharmacy and your work note is attached.

## 2022-08-07 NOTE — ED Triage Notes (Signed)
Pt via pov from home wit congestion, cough, sob since yesterday. Pt reports chest pain, mostly when coughing. Pt alert & oriented, nad noted.

## 2022-08-07 NOTE — Telephone Encounter (Signed)
  Chief Complaint: cough Symptoms: cough, congestion, nasal congestion Frequency: 2 days Pertinent Negatives: Patient denies fever Disposition: [] ED /[] Urgent Care (no appt availability in office) / [] Appointment(In office/virtual)/ []  [facility]/ [] Home Care/ [] Refused Recommended Disposition /[x] [facility]/ []  Follow-up with PCP Additional Notes: Husband is taking patient to mobile unit- advised do not share Rx medication without provider review- they are going to mobile unit today

## 2022-08-17 ENCOUNTER — Encounter: Payer: Self-pay | Admitting: Family Medicine

## 2022-08-17 ENCOUNTER — Ambulatory Visit: Payer: Self-pay | Attending: Family Medicine | Admitting: Family Medicine

## 2022-08-17 ENCOUNTER — Other Ambulatory Visit: Payer: Self-pay

## 2022-08-17 VITALS — BP 137/83 | HR 76 | Ht 67.0 in | Wt 277.2 lb

## 2022-08-17 DIAGNOSIS — G43709 Chronic migraine without aura, not intractable, without status migrainosus: Secondary | ICD-10-CM

## 2022-08-17 DIAGNOSIS — R052 Subacute cough: Secondary | ICD-10-CM

## 2022-08-17 DIAGNOSIS — G8929 Other chronic pain: Secondary | ICD-10-CM

## 2022-08-17 DIAGNOSIS — Z9189 Other specified personal risk factors, not elsewhere classified: Secondary | ICD-10-CM

## 2022-08-17 DIAGNOSIS — I10 Essential (primary) hypertension: Secondary | ICD-10-CM

## 2022-08-17 DIAGNOSIS — Z131 Encounter for screening for diabetes mellitus: Secondary | ICD-10-CM

## 2022-08-17 DIAGNOSIS — G5603 Carpal tunnel syndrome, bilateral upper limbs: Secondary | ICD-10-CM

## 2022-08-17 DIAGNOSIS — F419 Anxiety disorder, unspecified: Secondary | ICD-10-CM

## 2022-08-17 DIAGNOSIS — M25561 Pain in right knee: Secondary | ICD-10-CM

## 2022-08-17 DIAGNOSIS — Z1211 Encounter for screening for malignant neoplasm of colon: Secondary | ICD-10-CM

## 2022-08-17 MED ORDER — MELOXICAM 7.5 MG PO TABS
7.5000 mg | ORAL_TABLET | Freq: Every day | ORAL | 1 refills | Status: DC
  Filled 2022-08-17: qty 90, 90d supply, fill #0
  Filled 2023-02-28: qty 90, 90d supply, fill #1

## 2022-08-17 MED ORDER — ATORVASTATIN CALCIUM 40 MG PO TABS
40.0000 mg | ORAL_TABLET | Freq: Every day | ORAL | 1 refills | Status: DC
  Filled 2022-08-17 – 2022-08-28 (×2): qty 90, 90d supply, fill #0
  Filled 2022-11-30: qty 90, 90d supply, fill #1

## 2022-08-17 MED ORDER — HYDROCOD POLI-CHLORPHE POLI ER 10-8 MG/5ML PO SUER
5.0000 mL | Freq: Two times a day (BID) | ORAL | 0 refills | Status: DC | PRN
  Filled 2022-08-17: qty 115, 12d supply, fill #0

## 2022-08-17 MED ORDER — PREDNISONE 20 MG PO TABS
20.0000 mg | ORAL_TABLET | Freq: Every day | ORAL | 0 refills | Status: DC
  Filled 2022-08-17: qty 5, 5d supply, fill #0

## 2022-08-17 MED ORDER — GABAPENTIN 300 MG PO CAPS
300.0000 mg | ORAL_CAPSULE | Freq: Every day | ORAL | 1 refills | Status: DC
  Filled 2022-08-17: qty 90, 90d supply, fill #0

## 2022-08-17 MED ORDER — TOPIRAMATE 100 MG PO TABS
ORAL_TABLET | ORAL | 1 refills | Status: DC
  Filled 2022-08-17: qty 270, fill #0
  Filled 2022-08-28: qty 270, 90d supply, fill #0
  Filled 2022-11-30: qty 270, 90d supply, fill #1

## 2022-08-17 MED ORDER — FLUOXETINE HCL 20 MG PO CAPS
20.0000 mg | ORAL_CAPSULE | Freq: Every day | ORAL | 1 refills | Status: DC
  Filled 2022-08-17: qty 90, fill #0
  Filled 2022-08-28: qty 90, 90d supply, fill #0
  Filled 2022-11-30: qty 90, 90d supply, fill #1

## 2022-08-17 MED ORDER — AMLODIPINE BESYLATE 5 MG PO TABS
5.0000 mg | ORAL_TABLET | Freq: Every day | ORAL | 1 refills | Status: DC
  Filled 2022-08-17 – 2022-08-28 (×2): qty 90, 90d supply, fill #0
  Filled 2022-11-30: qty 90, 90d supply, fill #1

## 2022-08-17 NOTE — Progress Notes (Signed)
Cough for 2 weeks Covid positive on 08/07/2021 Discuss weight loss.

## 2022-08-17 NOTE — Progress Notes (Signed)
Subjective:  Patient ID: Monique Tyler, female    DOB: 11/23/1965  Age: 57 y.o. MRN: [id number]  CC: Hypertension   HPI Fadumo Heng is a 57 y.o. year old female with a history of chronic migraines, anxiety, right shoulder pain status post rotator cuff surgery in 07/2016, seasonal allergies, OSA (uses CPAP at night), migraines .  Interval History:  She had COVID 10 weeks ago but still has a residual cough.She did not receive any antiviral medication.  She needs to undergo a total knee replacement and Dr Erlinda Hong neds her to loose weight. She has fallen several times. Exercising is difficult because of her knee. She states she does not have much of an appetite but when she finally eats she does eat a lot.  She is doing well on her antihypertensive and her migraine is controlled Her anxiety is also controlled. Past Medical History:  Diagnosis Date   Asthma    GERD (gastroesophageal reflux disease)    Hyperlipidemia    Hypertension    pt denies a hx of HTN   Kidney stones    Lung nodules    Migraine    Vertigo     Past Surgical History:  Procedure Laterality Date   CESAREAN SECTION     MANDIBLE SURGERY     TUBAL LIGATION      Family History  Problem Relation Age of Onset   Hypertension Daughter    Hypertension Son     Social History   Socioeconomic History   Marital status: Married    Spouse name: Not on file   Number of children: 7   Years of education: Not on file   Highest education level: Not on file  Occupational History   Not on file  Tobacco Use   Smoking status: Former    Types: Cigarettes    Passive exposure: Never   Smokeless tobacco: Never  Vaping Use   Vaping Use: Never used  Substance and Sexual Activity   Alcohol use: No    Alcohol/week: 0.0 standard drinks of alcohol   Drug use: No   Sexual activity: Yes    Birth control/protection: Surgical  Other Topics Concern   Not on file  Social History Narrative   Not on file   Social Determinants  of Health   Financial Resource Strain: Not on file  Food Insecurity: Not on file  Transportation Needs: Not on file  Physical Activity: Not on file  Stress: Not on file  Social Connections: Not on file    Allergies  Allergen Reactions   Shellfish Allergy Swelling   Pork-Derived Products Nausea And Vomiting    Outpatient Medications Prior to Visit  Medication Sig Dispense Refill   albuterol (VENTOLIN HFA) 108 (90 Base) MCG/ACT inhaler Inhale 2 puffs into the lungs every 4 (four) hours as needed for wheezing or shortness of breath. 18 g 1   Ascorbic Acid (VITAMIN C) 1000 MG tablet Take 1,000 mg by mouth daily.     benzonatate (TESSALON) 100 MG capsule Take 1 capsule (100 mg total) by mouth every 8 (eight) hours. 21 capsule 0   cetirizine (ZYRTEC) 10 MG tablet Take 1 tablet (10 mg total) by mouth daily. 30 tablet 6   Cholecalciferol (VITAMIN D) 50 MCG (2000 UT) CAPS Take 1 capsule (2,000 Units total) by mouth daily. 90 capsule 1   fluticasone (FLONASE) 50 MCG/ACT nasal spray Place 2 sprays into both nostrils daily. 16 g 1   hydrOXYzine (ATARAX) 25 MG tablet Take  1 tablet (25 mg total) by mouth 3 (three) times daily as needed (Increase night time dose 50 mg). 270 tablet 1   amLODipine (NORVASC) 5 MG tablet Take 1 tablet (5 mg total) by mouth daily. 30 tablet 1   atorvastatin (LIPITOR) 40 MG tablet Take 1 tablet (40 mg total) by mouth daily. 90 tablet 1   FLUoxetine (PROZAC) 20 MG capsule TAKE 1 CAPSULE (20 MG TOTAL) BY MOUTH ONCE DAILY. 30 capsule 1   gabapentin (NEURONTIN) 300 MG capsule Take 1 capsule (300 mg total) by mouth at bedtime. 90 capsule 0   ibuprofen (ADVIL) 600 MG tablet Take 1 tablet (600 mg total) by mouth every 6 (six) hours as needed. 30 tablet 0   meloxicam (MOBIC) 7.5 MG tablet Take 1 tablet (7.5 mg total) by mouth daily. 30 tablet 1   topiramate (TOPAMAX) 100 MG tablet TAKE 2 TABLETS BY MOUTH IN THE MORNING AND THEN TAKE 1 TABLET IN THE EVENING 270 tablet 1    oxyCODONE (ROXICODONE) 5 MG immediate release tablet Take 1 tablet (5 mg total) by mouth every 6 (six) hours as needed for up to 10 doses for severe pain. (Patient not taking: Reported on 08/17/2022) 10 tablet 0   predniSONE (DELTASONE) 20 MG tablet Take 1 tablet (20 mg total) by mouth daily with breakfast. (Patient not taking: Reported on 08/17/2022) 5 tablet 0   No facility-administered medications prior to visit.     ROS Review of Systems  Constitutional:  Negative for activity change and appetite change.  HENT:  Negative for sinus pressure and sore throat.   Respiratory:  Negative for chest tightness, shortness of breath and wheezing.   Cardiovascular:  Negative for chest pain and palpitations.  Gastrointestinal:  Negative for abdominal distention, abdominal pain and constipation.  Genitourinary: Negative.   Musculoskeletal:        See HPI  Psychiatric/Behavioral:  Negative for behavioral problems and dysphoric mood.     Objective:  BP 137/83   Pulse 76   Ht 5\' 7"  (1.702 m)   Wt 277 lb 3.2 oz (125.7 kg)   LMP 01/18/2012   SpO2 97%   BMI 43.42 kg/m      08/17/2022    8:43 AM 08/07/2022    1:27 PM 04/05/2022    9:36 AM  BP/Weight  Systolic BP 137    Diastolic BP 83    Wt. (Lbs) 277.2 260 269  BMI 43.42 kg/m2 40.72 kg/m2 41.51 kg/m2      Physical Exam Constitutional:      Appearance: She is well-developed. She is obese.  Cardiovascular:     Rate and Rhythm: Normal rate.     Heart sounds: Normal heart sounds. No murmur heard. Pulmonary:     Effort: Pulmonary effort is normal.     Breath sounds: Normal breath sounds. No wheezing or rales.  Chest:     Chest wall: No tenderness.  Abdominal:     General: Bowel sounds are normal. There is no distension.     Palpations: Abdomen is soft. There is no mass.     Tenderness: There is no abdominal tenderness.  Musculoskeletal:        General: Normal range of motion.     Right lower leg: No edema.     Left lower leg: No  edema.  Neurological:     Mental Status: She is alert and oriented to person, place, and time.  Psychiatric:        Mood and Affect:  Mood normal.        Latest Ref Rng & Units 12/06/2021    9:28 AM 06/09/2021    9:38 AM 12/03/2020    8:59 AM  CMP  Glucose 70 - 99 mg/dL 100  95  98   BUN 6 - 24 mg/dL 10  11  13    Creatinine 0.57 - 1.00 mg/dL 1.05  0.96  1.09   Sodium 134 - 144 mmol/L 141  144  142   Potassium 3.5 - 5.2 mmol/L 4.4  4.2  4.2   Chloride 96 - 106 mmol/L 109  112  109   CO2 20 - 29 mmol/L 17  20  19    Calcium 8.7 - 10.2 mg/dL 9.2  9.7  9.2   Total Protein 6.0 - 8.5 g/dL 6.4   6.5   Total Bilirubin 0.0 - 1.2 mg/dL 0.3   0.3   Alkaline Phos 44 - 121 IU/L 142   146   AST 0 - 40 IU/L 20   20   ALT 0 - 32 IU/L 20   20     Lipid Panel     Component Value Date/Time   CHOL 206 (H) 12/06/2021 0928   TRIG 129 12/06/2021 0928   HDL 42 12/06/2021 0928   CHOLHDL 5.3 (H) 12/03/2020 0859   CHOLHDL 4.1 05/03/2011 0519   VLDL 16 05/03/2011 0519   LDLCALC 141 (H) 12/06/2021 0928    CBC    Component Value Date/Time   WBC 4.9 06/09/2021 0938   WBC 4.6 09/02/2019 1331   RBC 4.81 06/09/2021 0938   RBC 4.82 09/02/2019 1331   HGB 12.8 06/09/2021 0938   HCT 40.9 06/09/2021 0938   PLT 296 06/09/2021 0938   MCV 85 06/09/2021 0938   MCH 26.6 06/09/2021 0938   MCH 27.0 09/02/2019 1331   MCHC 31.3 (L) 06/09/2021 0938   MCHC 30.6 09/02/2019 1331   RDW 12.8 06/09/2021 0938   LYMPHSABS 1.5 06/09/2021 0938   MONOABS 0.8 06/25/2017 1801   EOSABS 0.1 06/09/2021 0938   BASOSABS 0.0 06/09/2021 0938    Lab Results  Component Value Date   HGBA1C 5.3 12/06/2021    Assessment & Plan:  1. Chronic pain of right knee She needs to loose weight to undergo knee surgery - predniSONE (DELTASONE) 20 MG tablet; Take 1 tablet (20 mg total) by mouth daily with breakfast.  Dispense: 5 tablet; Refill: 0 - meloxicam (MOBIC) 7.5 MG tablet; Take 1 tablet (7.5 mg total) by mouth daily.   Dispense: 90 tablet; Refill: 1  2. Screening for colon cancer - Fecal occult blood, imunochemical([facility])  3. Essential hypertension Controlled Counseled on blood pressure goal of less than 130/80, low-sodium, DASH diet, medication compliance, 150 minutes of moderate intensity exercise per week. Discussed medication compliance, adverse effects.  - CMP14+EGFR - LP+Non-HDL Cholesterol - amLODipine (NORVASC) 5 MG tablet; Take 1 tablet (5 mg total) by mouth daily.  Dispense: 90 tablet; Refill: 1  4. At risk for cardiovascular event Uncontrolled Will check lipid panel Low cholesterol diet - atorvastatin (LIPITOR) 40 MG tablet; Take 1 tablet (40 mg total) by mouth daily.  Dispense: 90 tablet; Refill: 1  5. Anxiety Controlled - FLUoxetine (PROZAC) 20 MG capsule; Take 1 capsule (20 mg total) by mouth daily.  Dispense: 90 capsule; Refill: 1  6. Bilateral carpal tunnel syndrome Controlled - gabapentin (NEURONTIN) 300 MG capsule; Take 1 capsule (300 mg total) by mouth at bedtime.  Dispense: 90 capsule; Refill: 1  7. Chronic migraine without aura without status migrainosus, not intractable Stable - topiramate (TOPAMAX) 100 MG tablet; Take 2 tablets (200 mg total) by mouth in the morning AND 1 tablet (100 mg total) every evening.  Dispense: 270 tablet; Refill: 1  8. Screening for diabetes mellitus - Hemoglobin A1c  9. Morbid obesity ([place]) Counseled on strategies to cut back on caloric intake She will also start a walking regimen as tolerated Unfortunately due to lack of medical coverage we are unable to obtain the [place] Referred to medical weight management   10. Subacute cough History of COVID 2 weeks ago with residual cough - predniSONE (DELTASONE) 20 MG tablet; Take 1 tablet (20 mg total) by mouth daily with breakfast.  Dispense: 5 tablet; Refill: 0 - chlorpheniramine-HYDROcodone (TUSSIONEX) 10-8 MG/5ML; Take 5 mLs by mouth every 12 (twelve) hours as needed for cough.   Dispense: 115 mL; Refill: 0    Meds ordered this encounter  Medications   predniSONE (DELTASONE) 20 MG tablet    Sig: Take 1 tablet (20 mg total) by mouth daily with breakfast.    Dispense:  5 tablet    Refill:  0   chlorpheniramine-HYDROcodone (TUSSIONEX) 10-8 MG/5ML    Sig: Take 5 mLs by mouth every 12 (twelve) hours as needed for cough.    Dispense:  115 mL    Refill:  0   amLODipine (NORVASC) 5 MG tablet    Sig: Take 1 tablet (5 mg total) by mouth daily.    Dispense:  90 tablet    Refill:  1    Please dispense 90-day supply   atorvastatin (LIPITOR) 40 MG tablet    Sig: Take 1 tablet (40 mg total) by mouth daily.    Dispense:  90 tablet    Refill:  1   FLUoxetine (PROZAC) 20 MG capsule    Sig: Take 1 capsule (20 mg total) by mouth daily.    Dispense:  90 capsule    Refill:  1   gabapentin (NEURONTIN) 300 MG capsule    Sig: Take 1 capsule (300 mg total) by mouth at bedtime.    Dispense:  90 capsule    Refill:  1   meloxicam (MOBIC) 7.5 MG tablet    Sig: Take 1 tablet (7.5 mg total) by mouth daily.    Dispense:  90 tablet    Refill:  1   topiramate (TOPAMAX) 100 MG tablet    Sig: Take 2 tablets (200 mg total) by mouth in the morning AND 1 tablet (100 mg total) every evening.    Dispense:  270 tablet    Refill:  1    Please dispense 90-day supply    Follow-up: Return in about 6 months (around 02/15/2023) for Chronic medical conditions.       Charlott Rakes, MD, FAAFP. [facility] [place], [place]   08/17/2022, 6:36 PM

## 2022-08-17 NOTE — Patient Instructions (Signed)

## 2022-08-18 LAB — CMP14+EGFR
ALT: 35 IU/L — ABNORMAL HIGH (ref 0–32)
AST: 26 IU/L (ref 0–40)
Albumin/Globulin Ratio: 1.8 (ref 1.2–2.2)
Albumin: 4.2 g/dL (ref 3.8–4.9)
Alkaline Phosphatase: 146 IU/L — ABNORMAL HIGH (ref 44–121)
BUN/Creatinine Ratio: 14 (ref 9–23)
BUN: 13 mg/dL (ref 6–24)
Bilirubin Total: 0.3 mg/dL (ref 0.0–1.2)
CO2: 18 mmol/L — ABNORMAL LOW (ref 20–29)
Calcium: 9.3 mg/dL (ref 8.7–10.2)
Chloride: 107 mmol/L — ABNORMAL HIGH (ref 96–106)
Creatinine, Ser: 0.92 mg/dL (ref 0.57–1.00)
Globulin, Total: 2.4 g/dL (ref 1.5–4.5)
Glucose: 110 mg/dL — ABNORMAL HIGH (ref 70–99)
Potassium: 4.2 mmol/L (ref 3.5–5.2)
Sodium: 140 mmol/L (ref 134–144)
Total Protein: 6.6 g/dL (ref 6.0–8.5)
eGFR: 72 mL/min/{1.73_m2} (ref >59)

## 2022-08-18 LAB — LP+NON-HDL CHOLESTEROL
Cholesterol, Total: 146 mg/dL (ref 100–199)
HDL: 41 mg/dL (ref >39)
LDL Chol Calc (NIH): 88 mg/dL (ref 0–99)
Total Non-HDL-Chol (LDL+VLDL): 105 mg/dL (ref 0–129)
Triglycerides: 91 mg/dL (ref 0–149)
VLDL Cholesterol Cal: 17 mg/dL (ref 5–40)

## 2022-08-18 LAB — HEMOGLOBIN A1C
Est. average glucose Bld gHb Est-mCnc: 117 mg/dL
Hgb A1c MFr Bld: 5.7 % — ABNORMAL HIGH (ref 4.8–5.6)

## 2022-08-24 ENCOUNTER — Other Ambulatory Visit: Payer: Self-pay

## 2022-08-25 ENCOUNTER — Other Ambulatory Visit: Payer: Self-pay

## 2022-08-28 ENCOUNTER — Other Ambulatory Visit: Payer: Self-pay | Admitting: Family Medicine

## 2022-08-29 ENCOUNTER — Other Ambulatory Visit: Payer: Self-pay

## 2022-08-30 MED ORDER — VITAMIN C 1000 MG PO TABS
1000.0000 mg | ORAL_TABLET | Freq: Every day | ORAL | 1 refills | Status: DC
  Filled 2022-08-30: qty 100, 100d supply, fill #0
  Filled 2023-02-28: qty 100, 100d supply, fill #1

## 2022-08-31 ENCOUNTER — Other Ambulatory Visit: Payer: Self-pay

## 2022-09-01 ENCOUNTER — Other Ambulatory Visit: Payer: Self-pay

## 2022-11-06 ENCOUNTER — Encounter: Payer: Self-pay | Admitting: *Deleted

## 2022-11-30 ENCOUNTER — Other Ambulatory Visit: Payer: Self-pay

## 2022-12-01 ENCOUNTER — Other Ambulatory Visit: Payer: Self-pay

## 2023-02-15 ENCOUNTER — Ambulatory Visit: Payer: Self-pay | Attending: Family Medicine | Admitting: Family Medicine

## 2023-02-15 ENCOUNTER — Encounter: Payer: Self-pay | Admitting: Family Medicine

## 2023-02-15 ENCOUNTER — Other Ambulatory Visit: Payer: Self-pay

## 2023-02-15 VITALS — BP 122/84 | HR 69 | Temp 98.2°F | Wt 271.0 lb

## 2023-02-15 DIAGNOSIS — Z1211 Encounter for screening for malignant neoplasm of colon: Secondary | ICD-10-CM

## 2023-02-15 DIAGNOSIS — R5383 Other fatigue: Secondary | ICD-10-CM

## 2023-02-15 DIAGNOSIS — I10 Essential (primary) hypertension: Secondary | ICD-10-CM

## 2023-02-15 DIAGNOSIS — Z131 Encounter for screening for diabetes mellitus: Secondary | ICD-10-CM

## 2023-02-15 DIAGNOSIS — F520 Hypoactive sexual desire disorder: Secondary | ICD-10-CM

## 2023-02-15 DIAGNOSIS — F419 Anxiety disorder, unspecified: Secondary | ICD-10-CM

## 2023-02-15 DIAGNOSIS — G5603 Carpal tunnel syndrome, bilateral upper limbs: Secondary | ICD-10-CM

## 2023-02-15 DIAGNOSIS — Z1231 Encounter for screening mammogram for malignant neoplasm of breast: Secondary | ICD-10-CM

## 2023-02-15 DIAGNOSIS — Z9189 Other specified personal risk factors, not elsewhere classified: Secondary | ICD-10-CM

## 2023-02-15 DIAGNOSIS — G43709 Chronic migraine without aura, not intractable, without status migrainosus: Secondary | ICD-10-CM

## 2023-02-15 LAB — POCT GLYCOSYLATED HEMOGLOBIN (HGB A1C): HbA1c, POC (controlled diabetic range): 5.5 % (ref 0.0–7.0)

## 2023-02-15 MED ORDER — ATORVASTATIN CALCIUM 40 MG PO TABS
40.0000 mg | ORAL_TABLET | Freq: Every day | ORAL | 1 refills | Status: DC
  Filled 2023-02-15 – 2023-02-28 (×2): qty 90, 90d supply, fill #0
  Filled 2023-05-30 (×2): qty 90, 90d supply, fill #1

## 2023-02-15 MED ORDER — AMLODIPINE BESYLATE 5 MG PO TABS
5.0000 mg | ORAL_TABLET | Freq: Every day | ORAL | 1 refills | Status: DC
  Filled 2023-02-15 – 2023-02-28 (×2): qty 90, 90d supply, fill #0
  Filled 2023-05-30 (×2): qty 90, 90d supply, fill #1

## 2023-02-15 MED ORDER — TOPIRAMATE 100 MG PO TABS
ORAL_TABLET | ORAL | 1 refills | Status: DC
  Filled 2023-02-15: qty 270, 90d supply, fill #0

## 2023-02-15 MED ORDER — HYDROXYZINE HCL 25 MG PO TABS
25.0000 mg | ORAL_TABLET | Freq: Three times a day (TID) | ORAL | 1 refills | Status: DC | PRN
  Filled 2023-02-15: qty 270, 90d supply, fill #0
  Filled 2023-02-28 – 2023-05-30 (×3): qty 270, 90d supply, fill #1

## 2023-02-15 MED ORDER — GABAPENTIN 300 MG PO CAPS
300.0000 mg | ORAL_CAPSULE | Freq: Every day | ORAL | 1 refills | Status: DC
  Filled 2023-02-15: qty 90, 90d supply, fill #0
  Filled 2023-02-28: qty 90, 90d supply, fill #1

## 2023-02-15 MED ORDER — FLUOXETINE HCL 40 MG PO CAPS
40.0000 mg | ORAL_CAPSULE | Freq: Every day | ORAL | 1 refills | Status: DC
  Filled 2023-02-15: qty 90, 90d supply, fill #0
  Filled 2023-02-28: qty 90, 90d supply, fill #1

## 2023-02-15 MED ORDER — TOPIRAMATE 100 MG PO TABS
100.0000 mg | ORAL_TABLET | Freq: Every day | ORAL | 1 refills | Status: DC
  Filled 2023-02-15 – 2023-02-28 (×2): qty 90, 90d supply, fill #0
  Filled 2023-05-30 (×2): qty 90, 90d supply, fill #1

## 2023-02-15 NOTE — Progress Notes (Signed)
Weight loss Fatigue depression

## 2023-02-15 NOTE — Patient Instructions (Signed)

## 2023-02-15 NOTE — Progress Notes (Signed)
Subjective:  Patient ID: Monique Tyler, female    DOB: 1965-08-08  Age: 57 y.o. MRN: [id number]  CC: Fatigue   HPI Monique Tyler is a 57 y.o. year old female with a history of chronic migraines, anxiety, right shoulder pain status post rotator cuff surgery in 07/2016, seasonal allergies, OSA (uses CPAP at night), migraines .   Interval History: Discussed the use of AI scribe software for clinical note transcription with the patient, who gave verbal consent to proceed.   She presents with high anxiety and depression, fatigue, and low libido. She reports increased anxiety due to personal stressors, including her son being on a kidney transplant list and taking on a second job. Despite these stressors, she has not been attending therapy regularly. She is currently on Prozac milligrams and hydroxyzine 25 mg as needed.  She also reports experiencing fatigue for the past six months, which she describes as extreme and causing her to force herself to go to the gym. She reports getting eight hours of sleep per night.  Last TSH was normal in 2022.  Additionally, she reports a decrease in libidoand is causing issues in her relationship with her husband. She expresses interest in potential treatments to improve her libido.   Hypertension is controlled. She is doing well on Topamax for her migraines and has been taking 100 mg at night (her med list reveals she is on 200 mg in the morning and 100 mg at night but this is incorrect).       Past Medical History:  Diagnosis Date   Asthma    GERD (gastroesophageal reflux disease)    Hyperlipidemia    Hypertension    pt denies a hx of HTN   Kidney stones    Lung nodules    Migraine    Vertigo     Past Surgical History:  Procedure Laterality Date   CESAREAN SECTION     MANDIBLE SURGERY     TUBAL LIGATION      Family History  Problem Relation Age of Onset   Hypertension Daughter    Hypertension Son     Social History   Socioeconomic  History   Marital status: Married    Spouse name: Not on file   Number of children: 7   Years of education: Not on file   Highest education level: Not on file  Occupational History   Not on file  Tobacco Use   Smoking status: Former    Types: Cigarettes    Passive exposure: Never   Smokeless tobacco: Never  Vaping Use   Vaping status: Never Used  Substance and Sexual Activity   Alcohol use: No    Alcohol/week: 0.0 standard drinks of alcohol   Drug use: No   Sexual activity: Yes    Birth control/protection: Surgical  Other Topics Concern   Not on file  Social History Narrative   Not on file   Social Determinants of Health   Financial Resource Strain: Not on file  Food Insecurity: Not on file  Transportation Needs: Not on file  Physical Activity: Not on file  Stress: Not on file  Social Connections: Not on file    Allergies  Allergen Reactions   Shellfish Allergy Swelling   Pork-Derived Products Nausea And Vomiting    Outpatient Medications Prior to Visit  Medication Sig Dispense Refill   albuterol (VENTOLIN HFA) 108 (90 Base) MCG/ACT inhaler Inhale 2 puffs into the lungs every 4 (four) hours as needed for wheezing or  shortness of breath. 18 g 1   Ascorbic Acid (VITAMIN C) 1000 MG tablet Take 1 tablet (1,000 mg total) by mouth daily. 100 tablet 1   cetirizine (ZYRTEC) 10 MG tablet Take 1 tablet (10 mg total) by mouth daily. 30 tablet 6   Cholecalciferol (VITAMIN D) 50 MCG (2000 UT) CAPS Take 1 capsule (2,000 Units total) by mouth daily. 90 capsule 1   fluticasone (FLONASE) 50 MCG/ACT nasal spray Place 2 sprays into both nostrils daily. 16 g 1   meloxicam (MOBIC) 7.5 MG tablet Take 1 tablet (7.5 mg total) by mouth daily. 90 tablet 1   hydrOXYzine (ATARAX) 25 MG tablet Take 1 tablet (25 mg total) by mouth 3 (three) times daily as needed (Increase night time dose 50 mg). 270 tablet 1   amLODipine (NORVASC) 5 MG tablet Take 1 tablet (5 mg total) by mouth daily. 90 tablet  1   atorvastatin (LIPITOR) 40 MG tablet Take 1 tablet (40 mg total) by mouth daily. 90 tablet 1   benzonatate (TESSALON) 100 MG capsule Take 1 capsule (100 mg total) by mouth every 8 (eight) hours. (Patient not taking: Reported on 02/15/2023) 21 capsule 0   chlorpheniramine-HYDROcodone (TUSSIONEX) 10-8 MG/5ML Take 5 mLs by mouth every 12 (twelve) hours as needed for cough. (Patient not taking: Reported on 02/15/2023) 115 mL 0   FLUoxetine (PROZAC) 20 MG capsule Take 1 capsule (20 mg total) by mouth daily. 90 capsule 1   gabapentin (NEURONTIN) 300 MG capsule Take 1 capsule (300 mg total) by mouth at bedtime. 90 capsule 1   predniSONE (DELTASONE) 20 MG tablet Take 1 tablet (20 mg total) by mouth daily with breakfast. (Patient not taking: Reported on 02/15/2023) 5 tablet 0   topiramate (TOPAMAX) 100 MG tablet Take 2 tablets (200 mg total) by mouth in the morning AND 1 tablet (100 mg total) every evening. 270 tablet 1   No facility-administered medications prior to visit.     ROS Review of Systems  Constitutional:  Positive for fatigue. Negative for activity change and appetite change.  HENT:  Negative for sinus pressure and sore throat.   Respiratory:  Negative for chest tightness, shortness of breath and wheezing.   Cardiovascular:  Negative for chest pain and palpitations.  Gastrointestinal:  Negative for abdominal distention, abdominal pain and constipation.  Genitourinary: Negative.   Musculoskeletal: Negative.   Psychiatric/Behavioral:  Negative for behavioral problems and dysphoric mood.        Positive for anxiety    Objective:  BP 122/84   Pulse 69   Temp 98.2 F (36.8 C) (Oral)   Wt 271 lb (122.9 kg)   LMP 01/18/2012   SpO2 98%   BMI 42.44 kg/m      02/15/2023    9:16 AM 08/17/2022    8:43 AM 08/07/2022    1:27 PM  BP/Weight  Systolic BP 122 137   Diastolic BP 84 83   Wt. (Lbs) 271 277.2 260  BMI 42.44 kg/m2 43.42 kg/m2 40.72 kg/m2      Physical  Exam Constitutional:      Appearance: She is well-developed.  Cardiovascular:     Rate and Rhythm: Normal rate.     Heart sounds: Normal heart sounds. No murmur heard. Pulmonary:     Effort: Pulmonary effort is normal.     Breath sounds: Normal breath sounds. No wheezing or rales.  Chest:     Chest wall: No tenderness.  Abdominal:     General: Bowel sounds are  normal. There is no distension.     Palpations: Abdomen is soft. There is no mass.     Tenderness: There is no abdominal tenderness.  Musculoskeletal:        General: Normal range of motion.     Right lower leg: No edema.     Left lower leg: No edema.  Neurological:     Mental Status: She is alert and oriented to person, place, and time.  Psychiatric:        Mood and Affect: Mood normal.        Latest Ref Rng & Units 08/17/2022    9:25 AM 12/06/2021    9:28 AM 06/09/2021    9:38 AM  CMP  Glucose 70 - 99 mg/dL 161  096  95   BUN 6 - 24 mg/dL 13  10  11    Creatinine 0.57 - 1.00 mg/dL 0.45  4.09  8.11   Sodium 134 - 144 mmol/L 140  141  144   Potassium 3.5 - 5.2 mmol/L 4.2  4.4  4.2   Chloride 96 - 106 mmol/L 107  109  112   CO2 20 - 29 mmol/L 18  17  20    Calcium 8.7 - 10.2 mg/dL 9.3  9.2  9.7   Total Protein 6.0 - 8.5 g/dL 6.6  6.4    Total Bilirubin 0.0 - 1.2 mg/dL 0.3  0.3    Alkaline Phos 44 - 121 IU/L 146  142    AST 0 - 40 IU/L 26  20    ALT 0 - 32 IU/L 35  20      Lipid Panel     Component Value Date/Time   CHOL 146 08/17/2022 0925   TRIG 91 08/17/2022 0925   HDL 41 08/17/2022 0925   CHOLHDL 5.3 (H) 12/03/2020 0859   CHOLHDL 4.1 05/03/2011 0519   VLDL 16 05/03/2011 0519   LDLCALC 88 08/17/2022 0925    CBC    Component Value Date/Time   WBC 4.9 06/09/2021 0938   WBC 4.6 09/02/2019 1331   RBC 4.81 06/09/2021 0938   RBC 4.82 09/02/2019 1331   HGB 12.8 06/09/2021 0938   HCT 40.9 06/09/2021 0938   PLT 296 06/09/2021 0938   MCV 85 06/09/2021 0938   MCH 26.6 06/09/2021 0938   MCH 27.0  09/02/2019 1331   MCHC 31.3 (L) 06/09/2021 0938   MCHC 30.6 09/02/2019 1331   RDW 12.8 06/09/2021 0938   LYMPHSABS 1.5 06/09/2021 0938   MONOABS 0.8 06/25/2017 1801   EOSABS 0.1 06/09/2021 0938   BASOSABS 0.0 06/09/2021 0938    Lab Results  Component Value Date   HGBA1C 5.5 02/15/2023      02/15/2023    9:23 AM 08/17/2022    8:47 AM 02/13/2022    9:15 AM 12/06/2021    8:45 AM  GAD 7 : Generalized Anxiety Score  Nervous, Anxious, on Edge 3 1 1  0  Control/stop worrying 3 1 1  0  Worry too much - different things 3 1 1  0  Trouble relaxing 3 1 1  0  Restless 2 1 1  0  Easily annoyed or irritable 3 1 1  0  Afraid - awful might happen 3 1 1  0  Total GAD 7 Score 20 7 7  0        02/15/2023    9:23 AM 08/17/2022    8:47 AM 02/13/2022    9:15 AM 12/06/2021    8:45 AM 06/09/2021    9:01  AM  Depression screen PHQ 2/9  Decreased Interest 2 0 1 0 0  Down, Depressed, Hopeless 2 0 1 0 0  PHQ - 2 Score 4 0 2 0 0  Altered sleeping 3 0 1 0 0  Tired, decreased energy 3 0 1 0 0  Change in appetite 2 0 1 0 0  Feeling bad or failure about yourself  2 0 1 0 0  Trouble concentrating 2 0 1 0 0  Moving slowly or fidgety/restless 1 0 1 0 0  Suicidal thoughts 0 0 0 0   PHQ-9 Score 17 0 8 0 0     Assessment & Plan:      Anxiety and Depression: High anxiety scores, currently on Prozac 20mg . Patient has multiple stressors including son's health and additional job. Discussed importance of therapy and self-care. -Increase Prozac from 20mg  to 40mg  daily. -Continue hydroxyzine 25 mg -Encourage regular therapy sessions.  Fatigue: New onset over past six months, despite adequate sleep. Patient is active and exercises regularly. Possible causes include low vitamin D, hypothyroidism, or anemia, menopause. -Order blood count, thyroid function tests, and vitamin D levels.  Menopausal symptoms/hypoactive sexual desire: Patient reports decreased libido, a common symptom in postmenopausal women. Discussed  potential treatments including Flibanserin (cost prohibitive), testosterone and sex therapy. -Refer to Gynecology for further discussion and potential treatment options.  Migraines: Well controlled on Topamax. -Continue Topamax as prescribed. -Adjusted dose of medication list to reflect the correct dose of Topamax 100 mg nightly.  Hypertension: Well controlled, blood pressure 122/84. -Continue amlodipine as prescribed.  Hyperlipidemia: No changes reported. -Continue atorvastatin as prescribed. -Counseled on blood pressure goal of less than 130/80, low-sodium, DASH diet, medication compliance, 150 minutes of moderate intensity exercise per week. Discussed medication compliance, adverse effects.   General Health Maintenance: -Order mammogram. -Order stool card for colon cancer screening. -Follow-up in six months.           Meds ordered this encounter  Medications   amLODipine (NORVASC) 5 MG tablet    Sig: Take 1 tablet (5 mg total) by mouth daily.    Dispense:  90 tablet    Refill:  1    Please dispense 90-day supply   atorvastatin (LIPITOR) 40 MG tablet    Sig: Take 1 tablet (40 mg total) by mouth daily.    Dispense:  90 tablet    Refill:  1   FLUoxetine (PROZAC) 40 MG capsule    Sig: Take 1 capsule (40 mg total) by mouth daily.    Dispense:  90 capsule    Refill:  1   gabapentin (NEURONTIN) 300 MG capsule    Sig: Take 1 capsule (300 mg total) by mouth at bedtime.    Dispense:  90 capsule    Refill:  1   topiramate (TOPAMAX) 100 MG tablet    Sig: Take 2 tablets (200 mg total) by mouth in the morning AND 1 tablet (100 mg total) every evening.    Dispense:  270 tablet    Refill:  1    Please dispense 90-day supply   hydrOXYzine (ATARAX) 25 MG tablet    Sig: Take 1 tablet (25 mg total) by mouth 3 (three) times daily as needed (Increase night time dose 50 mg).    Dispense:  270 tablet    Refill:  1    Please dispense 90-day supply    Follow-up: Return in about 6  months (around 08/18/2023) for Chronic medical conditions.  Hoy Register, MD, FAAFP. [facility] [place], [place] [phone]   02/15/2023, 9:48 AM

## 2023-02-16 ENCOUNTER — Other Ambulatory Visit: Payer: Self-pay | Admitting: Family Medicine

## 2023-02-16 DIAGNOSIS — R748 Abnormal levels of other serum enzymes: Secondary | ICD-10-CM

## 2023-02-16 LAB — T4, FREE: Free T4: 0.78 ng/dL — ABNORMAL LOW (ref 0.82–1.77)

## 2023-02-22 ENCOUNTER — Encounter (HOSPITAL_BASED_OUTPATIENT_CLINIC_OR_DEPARTMENT_OTHER): Payer: Self-pay | Admitting: Emergency Medicine

## 2023-02-22 ENCOUNTER — Inpatient Hospital Stay (HOSPITAL_BASED_OUTPATIENT_CLINIC_OR_DEPARTMENT_OTHER)
Admission: EM | Admit: 2023-02-22 | Discharge: 2023-02-26 | DRG: 815 | Disposition: A | Discharge status: Home / Self Care | Payer: Self-pay | Attending: Internal Medicine | Admitting: Internal Medicine

## 2023-02-22 ENCOUNTER — Emergency Department (HOSPITAL_BASED_OUTPATIENT_CLINIC_OR_DEPARTMENT_OTHER): Payer: Self-pay

## 2023-02-22 ENCOUNTER — Other Ambulatory Visit (HOSPITAL_BASED_OUTPATIENT_CLINIC_OR_DEPARTMENT_OTHER): Payer: Self-pay

## 2023-02-22 ENCOUNTER — Other Ambulatory Visit: Payer: Self-pay

## 2023-02-22 DIAGNOSIS — E785 Hyperlipidemia, unspecified: Secondary | ICD-10-CM | POA: Insufficient documentation

## 2023-02-22 DIAGNOSIS — Z87442 Personal history of urinary calculi: Secondary | ICD-10-CM

## 2023-02-22 DIAGNOSIS — Z7982 Long term (current) use of aspirin: Secondary | ICD-10-CM

## 2023-02-22 DIAGNOSIS — G43909 Migraine, unspecified, not intractable, without status migrainosus: Secondary | ICD-10-CM | POA: Diagnosis present

## 2023-02-22 DIAGNOSIS — K219 Gastro-esophageal reflux disease without esophagitis: Secondary | ICD-10-CM | POA: Diagnosis present

## 2023-02-22 DIAGNOSIS — E7800 Pure hypercholesterolemia, unspecified: Secondary | ICD-10-CM | POA: Diagnosis present

## 2023-02-22 DIAGNOSIS — Z87891 Personal history of nicotine dependence: Secondary | ICD-10-CM

## 2023-02-22 DIAGNOSIS — N390 Urinary tract infection, site not specified: Secondary | ICD-10-CM | POA: Insufficient documentation

## 2023-02-22 DIAGNOSIS — R079 Chest pain, unspecified: Principal | ICD-10-CM | POA: Diagnosis present

## 2023-02-22 DIAGNOSIS — Z23 Encounter for immunization: Secondary | ICD-10-CM

## 2023-02-22 DIAGNOSIS — Z8249 Family history of ischemic heart disease and other diseases of the circulatory system: Secondary | ICD-10-CM

## 2023-02-22 DIAGNOSIS — Z91013 Allergy to seafood: Secondary | ICD-10-CM

## 2023-02-22 DIAGNOSIS — B9689 Other specified bacterial agents as the cause of diseases classified elsewhere: Secondary | ICD-10-CM | POA: Insufficient documentation

## 2023-02-22 DIAGNOSIS — Z9989 Dependence on other enabling machines and devices: Secondary | ICD-10-CM

## 2023-02-22 DIAGNOSIS — Z6841 Body Mass Index (BMI) 40.0 and over, adult: Secondary | ICD-10-CM

## 2023-02-22 DIAGNOSIS — E66813 Obesity, class 3: Secondary | ICD-10-CM | POA: Diagnosis present

## 2023-02-22 DIAGNOSIS — R0789 Other chest pain: Secondary | ICD-10-CM | POA: Diagnosis present

## 2023-02-22 DIAGNOSIS — Z791 Long term (current) use of non-steroidal anti-inflammatories (NSAID): Secondary | ICD-10-CM

## 2023-02-22 DIAGNOSIS — G629 Polyneuropathy, unspecified: Secondary | ICD-10-CM | POA: Diagnosis present

## 2023-02-22 DIAGNOSIS — R590 Localized enlarged lymph nodes: Principal | ICD-10-CM | POA: Diagnosis present

## 2023-02-22 DIAGNOSIS — R072 Precordial pain: Secondary | ICD-10-CM | POA: Diagnosis present

## 2023-02-22 DIAGNOSIS — G4733 Obstructive sleep apnea (adult) (pediatric): Secondary | ICD-10-CM | POA: Diagnosis present

## 2023-02-22 DIAGNOSIS — I10 Essential (primary) hypertension: Secondary | ICD-10-CM

## 2023-02-22 DIAGNOSIS — Z79899 Other long term (current) drug therapy: Secondary | ICD-10-CM

## 2023-02-22 DIAGNOSIS — F411 Generalized anxiety disorder: Secondary | ICD-10-CM | POA: Insufficient documentation

## 2023-02-22 DIAGNOSIS — J45909 Unspecified asthma, uncomplicated: Secondary | ICD-10-CM | POA: Diagnosis present

## 2023-02-22 DIAGNOSIS — N200 Calculus of kidney: Secondary | ICD-10-CM | POA: Diagnosis present

## 2023-02-22 DIAGNOSIS — Z91014 Allergy to mammalian meats: Secondary | ICD-10-CM

## 2023-02-22 DIAGNOSIS — B961 Klebsiella pneumoniae [K. pneumoniae] as the cause of diseases classified elsewhere: Secondary | ICD-10-CM | POA: Diagnosis present

## 2023-02-22 DIAGNOSIS — G894 Chronic pain syndrome: Secondary | ICD-10-CM | POA: Insufficient documentation

## 2023-02-22 LAB — COMPREHENSIVE METABOLIC PANEL
ALT: 34 U/L (ref 0–44)
AST: 25 U/L (ref 15–41)
Albumin: 4.2 g/dL (ref 3.5–5.0)
Alkaline Phosphatase: 145 U/L — ABNORMAL HIGH (ref 38–126)
Anion gap: 8 (ref 5–15)
BUN: 13 mg/dL (ref 6–20)
CO2: 24 mmol/L (ref 22–32)
Calcium: 9.6 mg/dL (ref 8.9–10.3)
Chloride: 108 mmol/L (ref 98–111)
Creatinine, Ser: 0.95 mg/dL (ref 0.44–1.00)
GFR, Estimated: >60 mL/min (ref >60)
Glucose, Bld: 116 mg/dL — ABNORMAL HIGH (ref 70–99)
Potassium: 3.8 mmol/L (ref 3.5–5.1)
Sodium: 140 mmol/L (ref 135–145)
Total Bilirubin: 0.5 mg/dL (ref 0.3–1.2)
Total Protein: 7 g/dL (ref 6.5–8.1)

## 2023-02-22 LAB — CBC
HCT: 39.9 % (ref 36.0–46.0)
Hemoglobin: 12.7 g/dL (ref 12.0–15.0)
MCH: 27.4 pg (ref 26.0–34.0)
MCHC: 31.8 g/dL (ref 30.0–36.0)
MCV: 86 fL (ref 80.0–100.0)
Platelets: 302 10*3/uL (ref 150–400)
RBC: 4.64 MIL/uL (ref 3.87–5.11)
RDW: 13.8 % (ref 11.5–15.5)
WBC: 6 10*3/uL (ref 4.0–10.5)
nRBC: 0 % (ref 0.0–0.2)

## 2023-02-22 LAB — TROPONIN I (HIGH SENSITIVITY)
Troponin I (High Sensitivity): 4 ng/L (ref <18)
Troponin I (High Sensitivity): 3 ng/L (ref <18)
Troponin I (High Sensitivity): 5 ng/L (ref <18)
Troponin I (High Sensitivity): 4 ng/L (ref <18)
Troponin I (High Sensitivity): 5 ng/L (ref <18)

## 2023-02-22 LAB — D-DIMER, QUANTITATIVE: D-Dimer, Quant: 1.25 ug/mL-FEU — ABNORMAL HIGH (ref 0.00–0.50)

## 2023-02-22 LAB — LIPASE, BLOOD: Lipase: 48 U/L (ref 11–51)

## 2023-02-22 MED ORDER — MORPHINE SULFATE (PF) 2 MG/ML IV SOLN
2.0000 mg | INTRAVENOUS | Status: DC | PRN
  Administered 2023-02-22: 2 mg via INTRAVENOUS
  Filled 2023-02-22: qty 1

## 2023-02-22 MED ORDER — MORPHINE SULFATE (PF) 4 MG/ML IV SOLN
4.0000 mg | Freq: Once | INTRAVENOUS | Status: DC
  Filled 2023-02-22: qty 1

## 2023-02-22 MED ORDER — ALUM & MAG HYDROXIDE-SIMETH 200-200-20 MG/5ML PO SUSP
30.0000 mL | Freq: Once | ORAL | Status: AC
  Administered 2023-02-22: 30 mL via ORAL
  Filled 2023-02-22: qty 30

## 2023-02-22 MED ORDER — HYDROXYZINE HCL 25 MG PO TABS
25.0000 mg | ORAL_TABLET | Freq: Three times a day (TID) | ORAL | Status: DC | PRN
  Administered 2023-02-24: 25 mg via ORAL
  Filled 2023-02-22: qty 1

## 2023-02-22 MED ORDER — ASPIRIN 81 MG PO CHEW
81.0000 mg | CHEWABLE_TABLET | Freq: Every day | ORAL | Status: DC
  Administered 2023-02-22 – 2023-02-25 (×4): 81 mg via ORAL
  Filled 2023-02-22 (×4): qty 1

## 2023-02-22 MED ORDER — HYDROMORPHONE HCL 1 MG/ML IJ SOLN
1.0000 mg | Freq: Once | INTRAMUSCULAR | Status: AC
  Administered 2023-02-22: 1 mg via INTRAVENOUS
  Filled 2023-02-22: qty 1

## 2023-02-22 MED ORDER — ALBUTEROL SULFATE (2.5 MG/3ML) 0.083% IN NEBU: 3.0000 mL | INHALATION_SOLUTION | RESPIRATORY_TRACT | Status: DC | PRN

## 2023-02-22 MED ORDER — ACETAMINOPHEN 325 MG PO TABS
650.0000 mg | ORAL_TABLET | Freq: Four times a day (QID) | ORAL | Status: DC | PRN
  Administered 2023-02-24 – 2023-02-26 (×4): 650 mg via ORAL
  Filled 2023-02-22 (×4): qty 2

## 2023-02-22 MED ORDER — ONDANSETRON HCL 4 MG/2ML IJ SOLN
4.0000 mg | Freq: Four times a day (QID) | INTRAMUSCULAR | Status: DC | PRN
  Administered 2023-02-24: 4 mg via INTRAVENOUS
  Filled 2023-02-22: qty 2

## 2023-02-22 MED ORDER — GABAPENTIN 300 MG PO CAPS
300.0000 mg | ORAL_CAPSULE | Freq: Every day | ORAL | Status: DC
  Administered 2023-02-23 – 2023-02-25 (×3): 300 mg via ORAL
  Filled 2023-02-22 (×4): qty 1

## 2023-02-22 MED ORDER — SODIUM CHLORIDE 0.9% FLUSH: 3.0000 mL | INTRAVENOUS | Status: DC | PRN

## 2023-02-22 MED ORDER — TOPIRAMATE 100 MG PO TABS: 100.0000 mg | ORAL_TABLET | Freq: Every day | ORAL | Status: DC

## 2023-02-22 MED ORDER — HYDRALAZINE HCL 20 MG/ML IJ SOLN: 10.0000 mg | Freq: Four times a day (QID) | INTRAMUSCULAR | Status: DC | PRN

## 2023-02-22 MED ORDER — ATORVASTATIN CALCIUM 40 MG PO TABS
40.0000 mg | ORAL_TABLET | Freq: Every day | ORAL | Status: DC
  Administered 2023-02-22 – 2023-02-25 (×4): 40 mg via ORAL
  Filled 2023-02-22 (×5): qty 1

## 2023-02-22 MED ORDER — MORPHINE SULFATE (PF) 2 MG/ML IV SOLN
2.0000 mg | INTRAVENOUS | Status: DC | PRN
  Administered 2023-02-23 – 2023-02-26 (×5): 2 mg via INTRAVENOUS
  Filled 2023-02-22 (×6): qty 1

## 2023-02-22 MED ORDER — LIDOCAINE VISCOUS HCL 2 % MT SOLN
15.0000 mL | Freq: Once | OROMUCOSAL | Status: AC
  Administered 2023-02-22: 15 mL via ORAL
  Filled 2023-02-22: qty 15

## 2023-02-22 MED ORDER — SODIUM CHLORIDE 0.9 % IV BOLUS
500.0000 mL | Freq: Once | INTRAVENOUS | Status: AC
  Administered 2023-02-22: 500 mL via INTRAVENOUS

## 2023-02-22 MED ORDER — SODIUM CHLORIDE 0.9% FLUSH
3.0000 mL | Freq: Two times a day (BID) | INTRAVENOUS | Status: DC
  Administered 2023-02-22 – 2023-02-26 (×8): 3 mL via INTRAVENOUS

## 2023-02-22 MED ORDER — MORPHINE SULFATE (PF) 4 MG/ML IV SOLN
4.0000 mg | Freq: Once | INTRAVENOUS | Status: AC
  Administered 2023-02-22: 4 mg via INTRAVENOUS
  Filled 2023-02-22: qty 1

## 2023-02-22 MED ORDER — ASPIRIN 81 MG PO CHEW
324.0000 mg | CHEWABLE_TABLET | Freq: Once | ORAL | Status: AC
  Administered 2023-02-22: 324 mg via ORAL
  Filled 2023-02-22: qty 4

## 2023-02-22 MED ORDER — FLUOXETINE HCL 20 MG PO CAPS
40.0000 mg | ORAL_CAPSULE | Freq: Every day | ORAL | Status: DC
  Administered 2023-02-22 – 2023-02-25 (×4): 40 mg via ORAL
  Filled 2023-02-22 (×5): qty 2

## 2023-02-22 MED ORDER — SODIUM CHLORIDE 0.9 % IV SOLN: 250.0000 mL | INTRAVENOUS | Status: DC | PRN

## 2023-02-22 MED ORDER — ONDANSETRON HCL 4 MG PO TABS: 4.0000 mg | ORAL_TABLET | Freq: Four times a day (QID) | ORAL | Status: DC | PRN

## 2023-02-22 MED ORDER — AMLODIPINE BESYLATE 10 MG PO TABS
5.0000 mg | ORAL_TABLET | Freq: Every day | ORAL | Status: DC
  Administered 2023-02-22 – 2023-02-25 (×4): 5 mg via ORAL
  Filled 2023-02-22 (×5): qty 1

## 2023-02-22 MED ORDER — FLUTICASONE PROPIONATE 50 MCG/ACT NA SUSP: 2.0000 | Freq: Every day | NASAL | Status: DC | PRN

## 2023-02-22 MED ORDER — NITROGLYCERIN 0.4 MG SL SUBL
0.4000 mg | SUBLINGUAL_TABLET | SUBLINGUAL | Status: DC | PRN
  Administered 2023-02-22: 0.4 mg via SUBLINGUAL
  Filled 2023-02-22: qty 1

## 2023-02-22 MED ORDER — ACETAMINOPHEN 650 MG RE SUPP: 650.0000 mg | Freq: Four times a day (QID) | RECTAL | Status: DC | PRN

## 2023-02-22 MED ORDER — IOHEXOL 350 MG/ML SOLN
100.0000 mL | Freq: Once | INTRAVENOUS | Status: AC | PRN
  Administered 2023-02-22: 100 mL via INTRAVENOUS

## 2023-02-22 NOTE — ED Notes (Signed)
Patient alerted staff of increased chest pains,

## 2023-02-22 NOTE — ED Notes (Signed)
She returns from CT at this time and goes to b.r. She is ambulatory without assist.

## 2023-02-22 NOTE — Plan of Care (Signed)
Plan of Care Note for Accepted Transfer   Patient: Monique Tyler    NWG:[id number]     Facility requesting transfer: [facility] Requesting Provider: Dr. Charm Barges  57 year old female with history of hypertension, hyperlipidemia, obesity presented to [facility] with sudden onset of chest pain and pressure.  Elevated D-dimer, negative troponin x 2.  Workup so far negative other than mediastinal and abdominal lymphadenopathy which is increased in size.  Admission requested to hospitalist service, to rule out ACS.  Will likely need outpatient PET scan and biopsy to further investigate her lymphadenopathy.  Most recent vitals, labs and radiology:  Blood pressure 139/79, pulse (!) 59, temperature 98.6 F (37 C), temperature source Oral, resp. rate 20, weight 123 kg, last menstrual period 01/18/2012, SpO2 97%.      Latest Ref Rng & Units 02/22/2023    8:00 AM 02/15/2023   10:05 AM 06/09/2021    9:38 AM  CBC  WBC 4.0 - 10.5 K/uL 6.0  4.6  4.9   Hemoglobin 12.0 - 15.0 g/dL 57.8  46.9  62.9   Hematocrit 36.0 - 46.0 % 39.9  40.0  40.9   Platelets 150 - 400 K/uL 302  306  296       Latest Ref Rng & Units 02/22/2023    8:00 AM 02/15/2023   10:05 AM 08/17/2022    9:25 AM  BMP  Glucose 70 - 99 mg/dL 528  413  244   BUN 6 - 20 mg/dL 13  11  13    Creatinine 0.44 - 1.00 mg/dL 0.10  2.72  5.36   BUN/Creat Ratio 9 - 23  12  14    Sodium 135 - 145 mmol/L 140  144  140   Potassium 3.5 - 5.1 mmol/L 3.8  4.4  4.2   Chloride 98 - 111 mmol/L 108  112  107   CO2 22 - 32 mmol/L 24  20  18    Calcium 8.9 - 10.3 mg/dL 9.6  9.4  9.3      CT Angio Chest PE W/Cm &/Or Wo Cm  Result Date: 02/22/2023 CLINICAL DATA:  Pulmonary embolism (PE) suspected, low to intermediate prob, positive D-dimer EXAM: CT ANGIOGRAPHY CHEST WITH CONTRAST TECHNIQUE: Multidetector CT imaging of the chest was performed using the standard protocol during bolus administration of intravenous contrast. Multiplanar CT image reconstructions and MIPs were  obtained to evaluate the vascular anatomy. RADIATION DOSE REDUCTION: This exam was performed according to the departmental dose-optimization program which includes automated exposure control, adjustment of the mA and/or kV according to patient size and/or use of iterative reconstruction technique. CONTRAST:  OMNIPAQUE IOHEXOL 350 MG/ML SOLN COMPARISON:  CT angiography chest from 02/20/2018. FINDINGS: Cardiovascular: No evidence of embolism to the proximal subsegmental pulmonary artery level. Normal cardiac size. No pericardial effusion. No aortic aneurysm. Mediastinum/Nodes: Visualized thyroid gland appears grossly unremarkable. No solid / cystic mediastinal masses. The esophagus is nondistended precluding optimal assessment. There are multiple enlarged mediastinal lymph nodes, increased in size since the prior study from 02/20/2018. When measured in similar fashion, the largest in the subcarinal location measuring up to 13 x 22 mm (previously 8 x 12 mm), largest in the superior left hilum measuring up to 16 x 25 mm (previously 30 x 21 mm) and largest in the right hilum measuring up to 16 x 21 mm (previously 11 x 13 mm). Lungs/Pleura: The central tracheo-bronchial tree is patent. There are dependent changes in bilateral lungs. No mass or consolidation. No pleural effusion or pneumothorax. No  suspicious lung nodules. Upper Abdomen: Visualized upper abdominal viscera within normal limits. Musculoskeletal: There are multiple mildly enlarged gastrohepatic, porta hepatis, portacaval and peripancreatic lymph nodes. The visualized soft tissues of the chest wall are grossly unremarkable. No suspicious osseous lesions. There are mild multilevel degenerative changes in the visualized spine. Review of the MIP images confirms the above findings. IMPRESSION: 1. No evidence of pulmonary embolism. 2. Interval increase in size of mediastinal and hilar lymphadenopathy as described above. Multiple mildly enlarged upper  abdominal lymph nodes as well. Findings are nonspecific and differential diagnosis includes sarcoidosis, malignancy, etc. Further evaluation with PET-CT scan and tissue sampling is recommended. Electronically Signed   By: Jules Schick M.D.   On: 02/22/2023 10:30   DG Chest Port 1 View  Result Date: 02/22/2023 CLINICAL DATA:  cp EXAM: PORTABLE CHEST 1 VIEW COMPARISON:  CXR 08/07/22 FINDINGS: No pleural effusion. No pneumothorax. Normal cardiac and mediastinal contours. No radiographically apparent displaced rib fractures. Visualized upper abdomen is unremarkable. IMPRESSION: No focal airspace opacity Electronically Signed   By: Lorenza Cambridge M.D.   On: 02/22/2023 08:37    The patient has been accepted for transfer to [facility] or [facility], depending on bed and resource availability. The patient will remain under the care and responsibility of the referring provider until they have arrived to our inpatient facility.  Author: Dilia Alemany Sharlette Dense, MD  02/22/2023  Check [email or web address] for on-call coverage.  Nursing staff, Please call [facility] Admits & Consults System-Wide number on Amion as soon as patient's arrival, so appropriate admitting provider can evaluate the pt.

## 2023-02-22 NOTE — H&P (Addendum)
History and Physical    Monique Tyler ZOX:[id number] DOB: [birth year] DOA: 02/22/2023  PCP: Hoy Register, MD   Patient coming from: Home   Chief Complaint:  Chief Complaint  Patient presents with   Chest Pain    HPI:  Monique Tyler is a 57 y.o. female with medical history significant of hypertension, hyperlipidemia and morbid obesity presented to emergency department at [facility] with complaining of sudden onset of development of chest pain and chest pressure.  Patient reported that she developed sudden onset of chest pressure since 7:30 AM in the morning which remains persistent without any associated aggravating or relieving factor.  Patient denies any shortness of breath, palpitation, nausea, radiation of the chest pain around shoulder and neck.  Denies any previous episode of chest pain in the past.  She took Tylenol at home without any improvement of the chest pain.  ED Course:  Initial presentation to ED patient's heart rate 77, respiratory rate 18, blood pressure 142/68 and O2 sat 97% room air. Lab work showed WBC 6, RBC 4.64, hemoglobin 12.7 and platelet 302. CMP sodium 140, potassium 3.8, chloride 108, bicarb 24, blood glucose 9016, BUN 19, creatinine 1.95, AST 25, ALT 34 and elevated ALP 145. Lipase 48 within normal range. Elevated D-dimer 1.25. High-sensitivity troponin x 2 unremarkable.  EKG showed normal sinus rhythm without any ST and T wave abnormality.  Chest x-ray no focal airspace opacity.  No pleural effusion.  No pneumothorax.  Normal cardiac and mediastinal contours.  CTA chest showed no evidence of pulmonary embolism. . Interval increase in size of mediastinal and hilar lymphadenopathy as described above. Multiple mildly enlarged upper abdominal lymph nodes as well. Findings are nonspecific and differential diagnosis includes sarcoidosis, malignancy, etc. Further evaluation with PET-CT scan and tissue sampling is recommended.  In the ED patient  received Maalox which improved her pain.  Patient received aspirin 324 mg once and Dilaudid x 3.  Patient has been transferred [facility] to [facility] for ACS workup.  Review of Systems:  Review of Systems  Constitutional:  Negative for chills, diaphoresis, fever, malaise/fatigue and weight loss.  Respiratory:  Negative for cough, hemoptysis, sputum production, shortness of breath and wheezing.   Cardiovascular:  Positive for chest pain. Negative for palpitations, orthopnea, leg swelling and PND.  Gastrointestinal:  Negative for abdominal pain, diarrhea, heartburn, nausea and vomiting.  Genitourinary:  Negative for dysuria.  Musculoskeletal:  Positive for back pain. Negative for myalgias and neck pain.  Skin:  Negative for itching and rash.  Neurological:  Negative for dizziness and headaches.  Endo/Heme/Allergies:  Does not bruise/bleed easily.  Psychiatric/Behavioral:  The patient is not nervous/anxious.     Past Medical History:  Diagnosis Date   Asthma    GERD (gastroesophageal reflux disease)    Hyperlipidemia    Hypertension    pt denies a hx of HTN   Kidney stones    Lung nodules    Migraine    Vertigo     Past Surgical History:  Procedure Laterality Date   CESAREAN SECTION     MANDIBLE SURGERY     TUBAL LIGATION       reports that she has quit smoking. Her smoking use included cigarettes. She has never been exposed to tobacco smoke. She has never used smokeless tobacco. She reports that she does not drink alcohol and does not use drugs.  Allergies  Allergen Reactions   Shellfish Allergy Swelling   Pork-Derived Products Nausea And Vomiting  Family History  Problem Relation Age of Onset   Hypertension Daughter    Hypertension Son     Prior to Admission medications   Medication Sig Start Date End Date Taking? Authorizing Provider  acetaminophen (TYLENOL) 500 MG tablet Take 500 mg by mouth every 6 (six) hours as needed.   Yes [provider]  albuterol (VENTOLIN HFA) 108 (90 Base) MCG/ACT inhaler Inhale 2 puffs into the lungs every 4 (four) hours as needed for wheezing or shortness of breath. 11/30/20  Yes Hoy Register, MD  amLODipine (NORVASC) 5 MG tablet Take 1 tablet (5 mg total) by mouth daily. 02/15/23  Yes Hoy Register, MD  Ascorbic Acid (VITAMIN C) 1000 MG tablet Take 1 tablet (1,000 mg total) by mouth daily. 08/30/22  Yes Hoy Register, MD  atorvastatin (LIPITOR) 40 MG tablet Take 1 tablet (40 mg total) by mouth daily. 02/15/23  Yes Hoy Register, MD  cetirizine (ZYRTEC) 10 MG tablet Take 1 tablet (10 mg total) by mouth daily. 11/03/19  Yes Hoy Register, MD  Cholecalciferol (VITAMIN D) 50 MCG (2000 UT) CAPS Take 1 capsule (2,000 Units total) by mouth daily. 03/16/21  Yes Georgian Co M, PA-C  FLUoxetine (PROZAC) 40 MG capsule Take 1 capsule (40 mg total) by mouth daily. 02/15/23  Yes Newlin, Odette Horns, MD  fluticasone (FLONASE) 50 MCG/ACT nasal spray Place 2 sprays into both nostrils daily. 11/03/19  Yes Hoy Register, MD  gabapentin (NEURONTIN) 300 MG capsule Take 1 capsule (300 mg total) by mouth at bedtime. 02/15/23  Yes Hoy Register, MD  hydrOXYzine (ATARAX) 25 MG tablet Take 1 tablet (25 mg total) by mouth 3 (three) times daily as needed (Increase night time dose 50 mg). 02/15/23  Yes Hoy Register, MD  meloxicam (MOBIC) 7.5 MG tablet Take 1 tablet (7.5 mg total) by mouth daily. 08/17/22  Yes Hoy Register, MD  topiramate (TOPAMAX) 100 MG tablet Take 1 tablet (100 mg total) by mouth at bedtime. 02/15/23  Yes Hoy Register, MD     Physical Exam: Vitals:   02/22/23 1730 02/22/23 1830 02/22/23 1905 02/22/23 1939  BP: 121/76 136/88 (!) 140/72   Pulse: (!) 52 (!) 58 (!) 53   Resp:  15 20   Temp:   98 F (36.7 C)   TempSrc:   Oral   SpO2: 96% 94% 99%   Weight:    122.9 kg  Height:    5\' 7"  (1.702 m)    Physical Exam Constitutional:      General: She is not in acute distress.     Appearance: She is obese. She is not ill-appearing.  HENT:     Head: Normocephalic.  Cardiovascular:     Rate and Rhythm: Normal rate and regular rhythm.     Heart sounds: Normal heart sounds.     No systolic murmur is present.     No diastolic murmur is present.  Pulmonary:     Effort: Pulmonary effort is normal.     Breath sounds: Normal breath sounds.  Chest:     Chest wall: No mass or tenderness.  Abdominal:     General: Bowel sounds are normal.     Palpations: Abdomen is soft.     Tenderness: There is no abdominal tenderness.  Musculoskeletal:     Cervical back: Neck supple.     Right lower leg: No tenderness. No edema.     Left lower leg: Edema present.  Skin:    General: Skin is warm.  Capillary Refill: Capillary refill takes less than 2 seconds.  Neurological:     Mental Status: She is oriented to person, place, and time.  Psychiatric:        Mood and Affect: Mood normal. Mood is not anxious.      Labs on Admission: I have personally reviewed following labs and imaging studies  CBC: Recent Labs  Lab 02/22/23 0800  WBC 6.0  HGB 12.7  HCT 39.9  MCV 86.0  PLT 302   Basic Metabolic Panel: Recent Labs  Lab 02/22/23 0800  NA 140  K 3.8  CL 108  CO2 24  GLUCOSE 116*  BUN 13  CREATININE 0.95  CALCIUM 9.6   GFR: Estimated Creatinine Clearance: 87.7 mL/min (by C-G formula based on SCr of 0.95 mg/dL). Liver Function Tests: Recent Labs  Lab 02/22/23 0800  AST 25  ALT 34  ALKPHOS 145*  BILITOT 0.5  PROT 7.0  ALBUMIN 4.2   Recent Labs  Lab 02/22/23 0800  LIPASE 48   No results for input(s): "AMMONIA" in the last 168 hours. Coagulation Profile: No results for input(s): "INR", "PROTIME" in the last 168 hours. Cardiac Enzymes: Recent Labs  Lab 02/22/23 0800 02/22/23 1004 02/22/23 1820 02/22/23 2101  TROPONINIHS 4 3 5 4    BNP (last 3 results) No results for input(s): "BNP" in the last 8760 hours. HbA1C: No results for input(s):  "HGBA1C" in the last 72 hours. CBG: No results for input(s): "GLUCAP" in the last 168 hours. Lipid Profile: No results for input(s): "CHOL", "HDL", "LDLCALC", "TRIG", "CHOLHDL", "LDLDIRECT" in the last 72 hours. Thyroid Function Tests: No results for input(s): "TSH", "T4TOTAL", "FREET4", "T3FREE", "THYROIDAB" in the last 72 hours. Anemia Panel: No results for input(s): "VITAMINB12", "FOLATE", "FERRITIN", "TIBC", "IRON", "RETICCTPCT" in the last 72 hours. Urine analysis:    Component Value Date/Time   COLORURINE YELLOW 06/25/2017 1849   APPEARANCEUR CLOUDY (A) 06/25/2017 1849   LABSPEC 1.015 06/25/2017 1849   PHURINE 7.5 06/25/2017 1849   GLUCOSEU NEGATIVE 06/25/2017 1849   HGBUR TRACE (A) 06/25/2017 1849   BILIRUBINUR NEGATIVE 06/25/2017 1849   KETONESUR NEGATIVE 06/25/2017 1849   PROTEINUR NEGATIVE 06/25/2017 1849   UROBILINOGEN 0.2 08/04/2014 2120   NITRITE POSITIVE (A) 06/25/2017 1849   LEUKOCYTESUR NEGATIVE 06/25/2017 1849    Radiological Exams on Admission: I have personally reviewed images CT Angio Chest PE W/Cm &/Or Wo Cm  Result Date: 02/22/2023 CLINICAL DATA:  Pulmonary embolism (PE) suspected, low to intermediate prob, positive D-dimer EXAM: CT ANGIOGRAPHY CHEST WITH CONTRAST TECHNIQUE: Multidetector CT imaging of the chest was performed using the standard protocol during bolus administration of intravenous contrast. Multiplanar CT image reconstructions and MIPs were obtained to evaluate the vascular anatomy. RADIATION DOSE REDUCTION: This exam was performed according to the departmental dose-optimization program which includes automated exposure control, adjustment of the mA and/or kV according to patient size and/or use of iterative reconstruction technique. CONTRAST:  OMNIPAQUE IOHEXOL 350 MG/ML SOLN COMPARISON:  CT angiography chest from 02/20/2018. FINDINGS: Cardiovascular: No evidence of embolism to the proximal subsegmental pulmonary artery level. Normal cardiac  size. No pericardial effusion. No aortic aneurysm. Mediastinum/Nodes: Visualized thyroid gland appears grossly unremarkable. No solid / cystic mediastinal masses. The esophagus is nondistended precluding optimal assessment. There are multiple enlarged mediastinal lymph nodes, increased in size since the prior study from 02/20/2018. When measured in similar fashion, the largest in the subcarinal location measuring up to 13 x 22 mm (previously 8 x 12 mm), largest in the  superior left hilum measuring up to 16 x 25 mm (previously 30 x 21 mm) and largest in the right hilum measuring up to 16 x 21 mm (previously 11 x 13 mm). Lungs/Pleura: The central tracheo-bronchial tree is patent. There are dependent changes in bilateral lungs. No mass or consolidation. No pleural effusion or pneumothorax. No suspicious lung nodules. Upper Abdomen: Visualized upper abdominal viscera within normal limits. Musculoskeletal: There are multiple mildly enlarged gastrohepatic, porta hepatis, portacaval and peripancreatic lymph nodes. The visualized soft tissues of the chest wall are grossly unremarkable. No suspicious osseous lesions. There are mild multilevel degenerative changes in the visualized spine. Review of the MIP images confirms the above findings. IMPRESSION: 1. No evidence of pulmonary embolism. 2. Interval increase in size of mediastinal and hilar lymphadenopathy as described above. Multiple mildly enlarged upper abdominal lymph nodes as well. Findings are nonspecific and differential diagnosis includes sarcoidosis, malignancy, etc. Further evaluation with PET-CT scan and tissue sampling is recommended. Electronically Signed   By: Jules Schick M.D.   On: 02/22/2023 10:30   DG Chest Port 1 View  Result Date: 02/22/2023 CLINICAL DATA:  cp EXAM: PORTABLE CHEST 1 VIEW COMPARISON:  CXR 08/07/22 FINDINGS: No pleural effusion. No pneumothorax. Normal cardiac and mediastinal contours. No radiographically apparent displaced rib  fractures. Visualized upper abdomen is unremarkable. IMPRESSION: No focal airspace opacity Electronically Signed   By: Lorenza Cambridge M.D.   On: 02/22/2023 08:37    EKG: My personal interpretation of EKG shows: Sinus rhythm heart rate 57.  Normal PR interval.  No ST and T wave abnormality.     Assessment/Plan: Principal Problem:   Noncardiac chest pain Active Problems:   Essential hypertension   Mediastinal lymphadenopathy   Hyperlipidemia   GAD (generalized anxiety disorder)   Chronic pain syndrome    Assessment and Plan: Noncardiac chest pain -Patient reported intermittent quality substernal chest pressure/chest pain for last 16 hours.  No improvement with Tyleno at home.  No other associated symptoms. - Workup in the ED showed slightly elevated D-dimer 1.25 troponin x 2 negative.  EKG showed normal sinus rhythm heart rate 57 without any ST and T wave abnormality.  ACS ruled out. - CTA chest rule out PE however found out incidental finding of mediastinal and hilar lymphadenopathy. -Given patient has history of hypertension and hyperlipidemia which are the risk factor so has been transferred to [facility] for ACS workup.  Plan to obtain exercise stress test. -Based on physical exam, history and CTA finding chest pain is noncardiac origin however will admit patient for stress test for ACS rule out. -Per chart review previous echocardiogram and stress stress 04/2018 normal findings. - Reached out to cardiology Salley Hews, PA by epic email request for consult for obtain exercise stress test. -Obtaining complete echocardiogram. - Continue to trend troponin.  Obtaining morning EKG. -In the ED patient got aspirin 324 mg once.  Continue aspirin 81 mg daily -Continue morphine 2 mg every 4 hours as needed for severe pain. -Continue cardiac monitoring overnight. -Keep patient n.p.o. after midnight.  Incidental finding of mediastinal and hilar lymphadenopathy -CTA abdomen pelvis ruled  out PE however it showed interval increase in size of mediastinal and hilar lymphadenopathy.  Multiple mildly enlarged upper abdominal lymph node as well.  Findings are nonspecific and differential diagnosis includes sarcoidosis and malignancy.  Recommended PET/CT scan and tissue sampling. -Please consult and reach out to pulmonology in the a.m. for further recommendation versus outpatient workup.  Essential hypertension -Blood pressure within  normal range. - Resuming home amlodipine 5 mg daily and continue hydralazine 10 mg every 6 hours as needed for systolic blood pressure more than 160.  Hyperlipidemia -Continue Lipitor 40 mg daily  Generalized anxiety disorder -Resuming home Prozac 40 mg daily and Atarax 25 mg 3 times daily as needed  Chronic pain syndrome -Resuming home gabapentin 300 mg at bedtime.   DVT prophylaxis: Patient reported history of nausea vomiting and allergic reaction with Lovenox and heparin in the past.  That is why I am only continuing SCD. Code Status:  Full Code Diet: Heart healthy diet.  N.p.o. after midnight Family Communication: Patient's husband and family members at the bedside updated Disposition Plan: Plan to discharge home in next 2 to 3 days Consults: Cardiology Admission status:   Inpatient, Telemetry bed  Severity of Illness: The appropriate patient status for this patient is INPATIENT. Inpatient status is judged to be reasonable and necessary in order to provide the required intensity of service to ensure the patient's safety. The patient's presenting symptoms, physical exam findings, and initial radiographic and laboratory data in the context of their chronic comorbidities is felt to place them at high risk for further clinical deterioration. Furthermore, it is not anticipated that the patient will be medically stable for discharge from the hospital within 2 midnights of admission.   * I certify that at the point of admission it is my clinical  judgment that the patient will require inpatient hospital care spanning beyond 2 midnights from the point of admission due to high intensity of service, high risk for further deterioration and high frequency of surveillance required.Marland Kitchen    Tereasa Coop, MD [facility]  How to contact the [facility] Attending or Consulting provider 7A - 7P or covering provider during after hours 7P -7A, for this patient.  Check the care team in [facility] and look for a) attending/consulting [facility] provider listed and b) the [facility] team listed Log into [email or web address] and use [facility] universal password to access. If you do not have the password, please contact the hospital operator. Locate the [facility] provider you are looking for under [facility] and page to a number that you can be directly reached. If you still have difficulty reaching the provider, please page the [facility] (Director on Call) for the Hospitalists listed on amion for assistance.  02/22/2023, 10:15 PM

## 2023-02-22 NOTE — ED Notes (Signed)
[facility] is here and pt. Is taken from our facility without incident.

## 2023-02-22 NOTE — ED Notes (Signed)
I have just called report to [facility], Charity fundraiser at [employer]. Pt. Remains in no distress. Her family are with her also.

## 2023-02-22 NOTE — ED Triage Notes (Signed)
Pt arrives pov, steady gait c/o chest "heaviness" radiating to back starting ~ 45 mins pta

## 2023-02-22 NOTE — ED Provider Notes (Signed)
[place] EMERGENCY DEPARTMENT AT [facility] Provider Note   CSN: [id number] Arrival date & time: 02/22/23  7829     History  Chief Complaint  Patient presents with   Chest Pain    Monique Tyler is a 56 y.o. female.  She has a history of hypertension hypercholesterolemia.  She said she was at the grocery store shopping when she began experiencing substernal chest heaviness radiating through to her back 10 out of 10 about 45 minutes ago.  Associated with shortness of breath and feeling hot.  No prior history of same.  She said she felt a little bit better after she moved her bowels.  No cough no abdominal pain nausea vomiting diarrhea.  No known history of coronary disease.  Non-smoker.   Chest Pain Pain location:  Substernal area Pain quality: pressure   Pain radiates to:  Mid back Pain severity:  Severe Onset quality:  Sudden Duration:  45 minutes Timing:  Constant Progression:  Unchanged Chronicity:  New Relieved by:  None tried Worsened by:  Nothing Ineffective treatments:  None tried Associated symptoms: back pain and shortness of breath   Associated symptoms: no abdominal pain, no diaphoresis, no fever, no nausea and no vomiting   Risk factors: high cholesterol and hypertension   Risk factors: no coronary artery disease, no diabetes mellitus and no smoking        Home Medications Prior to Admission medications   Medication Sig Start Date End Date Taking? Authorizing Provider  albuterol (VENTOLIN HFA) 108 (90 Base) MCG/ACT inhaler Inhale 2 puffs into the lungs every 4 (four) hours as needed for wheezing or shortness of breath. 11/30/20   Hoy Register, MD  amLODipine (NORVASC) 5 MG tablet Take 1 tablet (5 mg total) by mouth daily. 02/15/23   Hoy Register, MD  Ascorbic Acid (VITAMIN C) 1000 MG tablet Take 1 tablet (1,000 mg total) by mouth daily. 08/30/22   Hoy Register, MD  atorvastatin (LIPITOR) 40 MG tablet Take 1 tablet (40 mg total) by mouth daily.  02/15/23   Hoy Register, MD  cetirizine (ZYRTEC) 10 MG tablet Take 1 tablet (10 mg total) by mouth daily. 11/03/19   Hoy Register, MD  Cholecalciferol (VITAMIN D) 50 MCG (2000 UT) CAPS Take 1 capsule (2,000 Units total) by mouth daily. 03/16/21   Anders Simmonds, PA-C  FLUoxetine (PROZAC) 40 MG capsule Take 1 capsule (40 mg total) by mouth daily. 02/15/23   Hoy Register, MD  fluticasone (FLONASE) 50 MCG/ACT nasal spray Place 2 sprays into both nostrils daily. 11/03/19   Hoy Register, MD  gabapentin (NEURONTIN) 300 MG capsule Take 1 capsule (300 mg total) by mouth at bedtime. 02/15/23   Hoy Register, MD  hydrOXYzine (ATARAX) 25 MG tablet Take 1 tablet (25 mg total) by mouth 3 (three) times daily as needed (Increase night time dose 50 mg). 02/15/23   Hoy Register, MD  meloxicam (MOBIC) 7.5 MG tablet Take 1 tablet (7.5 mg total) by mouth daily. 08/17/22   Hoy Register, MD  topiramate (TOPAMAX) 100 MG tablet Take 1 tablet (100 mg total) by mouth at bedtime. 02/15/23   Hoy Register, MD      Allergies    Shellfish allergy and Pork-derived products    Review of Systems   Review of Systems  Constitutional:  Negative for diaphoresis and fever.  Respiratory:  Positive for shortness of breath.   Cardiovascular:  Positive for chest pain.  Gastrointestinal:  Negative for abdominal pain, nausea and vomiting.  Musculoskeletal:  Positive for back pain.    Physical Exam Updated Vital Signs BP (!) 142/68   Pulse 77   Temp 98.6 F (37 C) (Oral)   Resp 18   Wt 123 kg   LMP 01/18/2012   SpO2 97%   BMI 42.47 kg/m  Physical Exam Vitals and nursing note reviewed.  Constitutional:      General: She is not in acute distress.    Appearance: She is well-developed.  HENT:     Head: Normocephalic and atraumatic.  Eyes:     Conjunctiva/sclera: Conjunctivae normal.  Cardiovascular:     Rate and Rhythm: Normal rate and regular rhythm.     Heart sounds: Normal heart sounds. No murmur  heard. Pulmonary:     Effort: Pulmonary effort is normal. No respiratory distress.     Breath sounds: Normal breath sounds.  Abdominal:     Palpations: Abdomen is soft.     Tenderness: There is no abdominal tenderness.  Musculoskeletal:        General: No swelling. Normal range of motion.     Cervical back: Neck supple.     Right lower leg: No tenderness. No edema.     Left lower leg: No tenderness. No edema.  Skin:    General: Skin is warm and dry.     Capillary Refill: Capillary refill takes less than 2 seconds.  Neurological:     General: No focal deficit present.     Mental Status: She is alert.     ED Results / Procedures / Treatments   Labs (all labs ordered are listed, but only abnormal results are displayed) Labs Reviewed  COMPREHENSIVE METABOLIC PANEL - Abnormal; Notable for the following components:      Result Value   Glucose, Bld 116 (*)    Alkaline Phosphatase 145 (*)    All other components within normal limits  D-DIMER, QUANTITATIVE - Abnormal; Notable for the following components:   D-Dimer, Quant 1.25 (*)    All other components within normal limits  CBC  LIPASE, BLOOD  D-DIMER, QUANTITATIVE  TROPONIN I (HIGH SENSITIVITY)  TROPONIN I (HIGH SENSITIVITY)    EKG EKG Interpretation Date/Time:  Thursday February 22 2023 07:49:48 EDT Ventricular Rate:  77 PR Interval:  167 QRS Duration:  106 QT Interval:  396 QTC Calculation: 449 R Axis:   17  Text Interpretation: Sinus rhythm Low voltage, precordial leads RSR' in V1 or V2, probably normal variant No significant change since prior 1/24 Confirmed by Meridee Score [phone]) on 02/22/2023 7:54:13 AM  Radiology CT Angio Chest PE W/Cm &/Or Wo Cm  Result Date: 02/22/2023 CLINICAL DATA:  Pulmonary embolism (PE) suspected, low to intermediate prob, positive D-dimer EXAM: CT ANGIOGRAPHY CHEST WITH CONTRAST TECHNIQUE: Multidetector CT imaging of the chest was performed using the standard protocol during bolus  administration of intravenous contrast. Multiplanar CT image reconstructions and MIPs were obtained to evaluate the vascular anatomy. RADIATION DOSE REDUCTION: This exam was performed according to the departmental dose-optimization program which includes automated exposure control, adjustment of the mA and/or kV according to patient size and/or use of iterative reconstruction technique. CONTRAST:  OMNIPAQUE IOHEXOL 350 MG/ML SOLN COMPARISON:  CT angiography chest from 02/20/2018. FINDINGS: Cardiovascular: No evidence of embolism to the proximal subsegmental pulmonary artery level. Normal cardiac size. No pericardial effusion. No aortic aneurysm. Mediastinum/Nodes: Visualized thyroid gland appears grossly unremarkable. No solid / cystic mediastinal masses. The esophagus is nondistended precluding optimal assessment. There are multiple enlarged mediastinal lymph nodes, increased  in size since the prior study from 02/20/2018. When measured in similar fashion, the largest in the subcarinal location measuring up to 13 x 22 mm (previously 8 x 12 mm), largest in the superior left hilum measuring up to 16 x 25 mm (previously 30 x 21 mm) and largest in the right hilum measuring up to 16 x 21 mm (previously 11 x 13 mm). Lungs/Pleura: The central tracheo-bronchial tree is patent. There are dependent changes in bilateral lungs. No mass or consolidation. No pleural effusion or pneumothorax. No suspicious lung nodules. Upper Abdomen: Visualized upper abdominal viscera within normal limits. Musculoskeletal: There are multiple mildly enlarged gastrohepatic, porta hepatis, portacaval and peripancreatic lymph nodes. The visualized soft tissues of the chest wall are grossly unremarkable. No suspicious osseous lesions. There are mild multilevel degenerative changes in the visualized spine. Review of the MIP images confirms the above findings. IMPRESSION: 1. No evidence of pulmonary embolism. 2. Interval increase in size of  mediastinal and hilar lymphadenopathy as described above. Multiple mildly enlarged upper abdominal lymph nodes as well. Findings are nonspecific and differential diagnosis includes sarcoidosis, malignancy, etc. Further evaluation with PET-CT scan and tissue sampling is recommended. Electronically Signed   By: Jules Schick M.D.   On: 02/22/2023 10:30   DG Chest Port 1 View  Result Date: 02/22/2023 CLINICAL DATA:  cp EXAM: PORTABLE CHEST 1 VIEW COMPARISON:  CXR 08/07/22 FINDINGS: No pleural effusion. No pneumothorax. Normal cardiac and mediastinal contours. No radiographically apparent displaced rib fractures. Visualized upper abdomen is unremarkable. IMPRESSION: No focal airspace opacity Electronically Signed   By: Lorenza Cambridge M.D.   On: 02/22/2023 08:37    Procedures Procedures    Medications Ordered in ED Medications  nitroGLYCERIN (NITROSTAT) SL tablet 0.4 mg (0.4 mg Sublingual Given 02/22/23 0808)  aspirin chewable tablet 324 mg (324 mg Oral Given 02/22/23 0808)  morphine (PF) 4 MG/ML injection 4 mg (4 mg Intravenous Given 02/22/23 0907)  sodium chloride 0.9 % bolus 500 mL (0 mLs Intravenous Stopped 02/22/23 1137)  iohexol (OMNIPAQUE) 350 MG/ML injection 100 mL (100 mLs Intravenous Contrast Given 02/22/23 1008)  alum & mag hydroxide-simeth (MAALOX/MYLANTA) 200-200-20 MG/5ML suspension 30 mL (30 mLs Oral Given 02/22/23 1050)    And  lidocaine (XYLOCAINE) 2 % viscous mouth solution 15 mL (15 mLs Oral Given 02/22/23 1050)  HYDROmorphone (DILAUDID) injection 1 mg (1 mg Intravenous Given 02/22/23 1212)    ED Course/ Medical Decision Making/ A&P Clinical Course as of 02/22/23 1745  Thu Feb 22, 2023  0819 First nitroglycerin dropped her blood pressure and she felt nauseated and states the pain is worse.  Will continue to monitor. [MB]  [id number] Chest x-ray interpreted by me as no acute infiltrate.  Awaiting radiology reading. [MB]  1103 D-dimer elevated here.  CT angio does not show any evidence of PE but does  have lymphadenopathy that we will need outpatient follow-up. [MB]  1233 Patient continues to have ongoing pain.  She has multiple risk factors so discussed with [facility] hospitalist Dr. Erenest Blank who will put her in for an admission over to [facility]. [MB]    Clinical Course User Index [MB] Terrilee Files, MD                                 Medical Decision Making Amount and/or Complexity of Data Reviewed Labs: ordered. Radiology: ordered.  Risk OTC drugs. Prescription drug management. Decision regarding hospitalization.  This patient complains of substernal chest pressure, shortness of breath; this involves an extensive number of treatment Options and is a complaint that carries with it a high risk of complications and morbidity. The differential includes ACS, pneumonia, PE, vascular, pneumothorax, reflux  I ordered, reviewed and interpreted labs, which included CBC normal chemistries normal other than mildly elevated glucose, LFTs normal other than mildly elevated alk phos, troponins flat, D-dimer elevated I ordered medication aspirin, pain medicine, GI cocktail, nitroglycerin and reviewed PMP when indicated. I ordered imaging studies which included chest x-ray and CT angio chest and I independently    visualized and interpreted imaging which showed no acute findings.  Does have progression of lymphadenopathy Additional history obtained from patient's family members Previous records obtained and reviewed in epic no recent admissions I consulted [facility] hospitalist Dr. Erenest Blank and discussed lab and imaging findings and discussed disposition.  Cardiac monitoring reviewed, normal sinus rhythm Social determinants considered, patient with ongoing depression barriers Critical Interventions: None  After the interventions stated above, I reevaluated the patient and found patient still to be symptomatic and having active pain Admission and further testing considered, she would benefit from  mission the hospital for further workup and management.  Patient in agreement with plan for admission.  Awaiting transfer to [facility].         Final Clinical Impression(s) / ED Diagnoses Final diagnoses:  Acute chest pain    Rx / DC Orders ED Discharge Orders     None         Terrilee Files, MD 02/22/23 1747

## 2023-02-22 NOTE — Plan of Care (Signed)

## 2023-02-22 NOTE — ED Notes (Signed)
Monique Tyler with cl called for transport

## 2023-02-23 ENCOUNTER — Inpatient Hospital Stay (HOSPITAL_COMMUNITY): Payer: Self-pay

## 2023-02-23 ENCOUNTER — Observation Stay (HOSPITAL_COMMUNITY): Payer: Self-pay

## 2023-02-23 DIAGNOSIS — R072 Precordial pain: Secondary | ICD-10-CM

## 2023-02-23 DIAGNOSIS — R079 Chest pain, unspecified: Secondary | ICD-10-CM

## 2023-02-23 DIAGNOSIS — R0789 Other chest pain: Secondary | ICD-10-CM | POA: Diagnosis present

## 2023-02-23 LAB — C-REACTIVE PROTEIN: CRP: 0.7 mg/dL (ref <1.0)

## 2023-02-23 LAB — LACTATE DEHYDROGENASE: LDH: 160 U/L (ref 98–192)

## 2023-02-23 LAB — SEDIMENTATION RATE: Sed Rate: 10 mm/hr (ref 0–22)

## 2023-02-23 MED ORDER — IOHEXOL 300 MG/ML  SOLN
100.0000 mL | Freq: Once | INTRAMUSCULAR | Status: AC | PRN
  Administered 2023-02-23: 100 mL via INTRAVENOUS

## 2023-02-23 MED ORDER — IOHEXOL 9 MG/ML PO SOLN
ORAL | Status: AC
  Filled 2023-02-23: qty 1000

## 2023-02-23 MED ORDER — PANTOPRAZOLE SODIUM 40 MG IV SOLR
40.0000 mg | INTRAVENOUS | Status: DC
  Administered 2023-02-23 – 2023-02-24 (×2): 40 mg via INTRAVENOUS
  Filled 2023-02-23 (×2): qty 10

## 2023-02-23 MED ORDER — ALUM & MAG HYDROXIDE-SIMETH 200-200-20 MG/5ML PO SUSP
30.0000 mL | Freq: Once | ORAL | Status: AC
  Administered 2023-02-23: 30 mL via ORAL
  Filled 2023-02-23: qty 30

## 2023-02-23 MED ORDER — IOHEXOL 9 MG/ML PO SOLN
500.0000 mL | ORAL | Status: AC
  Administered 2023-02-23 (×2): 500 mL via ORAL

## 2023-02-23 NOTE — Progress Notes (Signed)
*  PRELIMINARY RESULTS* Echocardiogram 2D Echocardiogram has been performed.  Monique Tyler  02/23/2023, 2:25 PM

## 2023-02-23 NOTE — Hospital Course (Addendum)
56 y.o.f w/ hypertension, hyperlipidemia and morbid obesity presented to ED DB w/ c/o of sudden onset of  chest pain and chest pressure since 7:30 AM in the morning,remained persistent without any associated aggravating or relieving factor, w/o shortness of breath, palpitation, nausea, radiation of the chest ED Course: Vitals fairly stable, not hypoxic Lab work showed fairly unremarkable CBC, blood sugar stable.  Lites stable LFTs normal troponin x 6 negative Lipase 48 , Elevated D-dimer 1.25 EKG showed normal sinus rhythm without any ST and T wave abnormality. CXR- no focal airspace opacity.  No pleural effusion.  No pneumothorax.  Normal cardiac and mediastinal contours. CTA chest showed no evidence of pulmonary embolism. Interval increase in size of mediastinal and hilar lymphadenopathy.Multiple mildly enlarged upper abdominal lymph nodes as well. Findings are nonspecific and differential diagnosis includes sarcoidosis, malignancy, etc. Further evaluation with PET-CT scan and tissue sampling is recommended. She received Maalox which improved her pain.Patient received aspirin 324 mg once and Dilaudid x 3. Patient has been transferred [facility] for ACS workup. A1c normal 5.5

## 2023-02-23 NOTE — Progress Notes (Signed)
PROGRESS NOTE Monique Tyler  KGM:[id number] DOB: 08/04/1965 DOA: 02/22/2023 PCP: Hoy Register, MD  Brief Narrative/Hospital Course: 57 y.o.f w/ hypertension, hyperlipidemia and morbid obesity presented to ED DB w/ c/o of sudden onset of  chest pain and chest pressure since 7:30 AM in the morning,remained persistent without any associated aggravating or relieving factor, w/o shortness of breath, palpitation, nausea, radiation of the chest ED Course: Vitals fairly stable, not hypoxic Lab work showed fairly unremarkable CBC, blood sugar stable.  Lites stable LFTs normal troponin x 6 negative Lipase 48 , Elevated D-dimer 1.25 EKG showed normal sinus rhythm without any ST and T wave abnormality. CXR- no focal airspace opacity.  No pleural effusion.  No pneumothorax.  Normal cardiac and mediastinal contours. CTA chest showed no evidence of pulmonary embolism. Interval increase in size of mediastinal and hilar lymphadenopathy.Multiple mildly enlarged upper abdominal lymph nodes as well. Findings are nonspecific and differential diagnosis includes sarcoidosis, malignancy, etc. Further evaluation with PET-CT scan and tissue sampling is recommended. She received Maalox which improved her pain.Patient received aspirin 324 mg once and Dilaudid x 3. Patient has been transferred [facility] to [facility] for ACS workup. A1c normal 5.5     Subjective: Seen and examined this morning Alert oriented on room air doing well no new complaints   Assessment and Plan: Principal Problem:   Noncardiac chest pain Active Problems:   Essential hypertension   Mediastinal lymphadenopathy   Hyperlipidemia   GAD (generalized anxiety disorder)   Chronic pain syndrome   Noncardiac chest pain: Serial troponins negative x 6 ACS ruled out.  Cardiology has been consulted and discussed this morning less likely cardiac given fairly recent cardiac evaluation, echocardiogram ordered.   Incidental finding  of mediastinal and hilar lymphadenopathy: Findings are nonspecific and differential diagnosis includes sarcoidosis and malignancy advised PET/CT scan and tissue sampling-consulted oncology Dr Myna Hidalgo, advised to d/w PCCM, Dr Francine Graven consulted and pulmonary will be seeing in consultation.  Getting CT abdomen pelvis with IV contrast to eval for other sites of lymphadenopathy and further serologic work up started  Essential hypertension: Controlled on amlodipine hydralazine as needed HLD on Lipitor GAD: Continue home Prozac and Atarax prn Chronic pain syndrome: On gabapentin  Morbid Obesity:Patient's Body mass index is 42.44 kg/m. : Will benefit with PCP follow-up, weight loss  OSA continue CPAP  DVT prophylaxis: SCDs Start: 02/22/23 2021 Code Status:   Code Status: Full Code Family Communication: plan of care discussed with patient at bedside. Patient status is:  admitted as observation but remains hospitalized for ongoing  because of ongoing evaluation of chest pain and abnormal CT finding Level of care: Telemetry   Dispo: The patient is from: Home            Anticipated disposition: Pending clinical improvement Objective: Vitals last 24 hrs: Vitals:   02/22/23 2309 02/23/23 0340 02/23/23 1026 02/23/23 1336  BP: 136/71 137/81 123/60 135/63  Pulse: 61 (!) 55 68 (!) 57  Resp: 14 14 18 20   Temp: 98.5 F (36.9 C) 98 F (36.7 C)  98.2 F (36.8 C)  TempSrc: Oral Oral  Oral  SpO2: 100% 100% 99% 100%  Weight:      Height:       Weight change:   Physical Examination: General exam: alert awake, older than stated age HEENT:Oral mucosa moist, Ear/Nose WNL grossly Respiratory system: bilaterally clear BS, no use of accessory muscle Cardiovascular system: S1 & S2 +, No JVD. Gastrointestinal system: Abdomen soft,NT,ND, BS+ Nervous  System:Alert, awake, moving extremities. Extremities: LE edema neg,distal peripheral pulses palpable.  Skin: No rashes,no icterus. MSK: Normal muscle  bulk,tone, power  Medications reviewed:  Scheduled Meds:  alum & mag hydroxide-simeth  30 mL Oral Once   amLODipine  5 mg Oral Daily   aspirin  81 mg Oral Daily   atorvastatin  40 mg Oral Daily   FLUoxetine  40 mg Oral Daily   gabapentin  300 mg Oral QHS   pantoprazole (PROTONIX) IV  40 mg Intravenous Q24H   sodium chloride flush  3 mL Intravenous Q12H  Continuous Infusions:  sodium chloride      Diet Order             Diet regular Room service appropriate? Yes; Fluid consistency: Thin  Diet effective now                  Intake/Output Summary (Last 24 hours) at 02/23/2023 1342 Last data filed at 02/23/2023 1021 Gross per 24 hour  Intake 3 ml  Output 1 ml  Net 2 ml   Net IO Since Admission: 505.12 mL [02/23/23 1342]  Wt Readings from Last 3 Encounters:  02/22/23 122.9 kg  02/15/23 122.9 kg  08/17/22 125.7 kg     Unresulted Labs (From admission, onward)     Start     Ordered   02/24/23 0500  Basic metabolic panel  Daily,   R     Question:  Specimen collection method  Answer:  Lab=Lab collect   02/23/23 1157   02/23/23 1152  C-reactive protein  Once,   R        02/23/23 1151   02/23/23 1151  Lysozyme, serum  Once,   R        02/23/23 1150   02/23/23 1128  Beta 2 microglobulin, serum  Once,   R        02/23/23 1127   02/23/23 1127  Angiotensin converting enzyme  Once,   R        02/23/23 1127   02/23/23 1023  Sedimentation rate  Once,   R        02/23/23 1022          Data Reviewed: I have personally reviewed following labs and imaging studies CBC: Recent Labs  Lab 02/22/23 0800 02/23/23 0042  WBC 6.0 5.4  HGB 12.7 12.1  HCT 39.9 41.9  MCV 86.0 94.4  PLT 302 269   Basic Metabolic Panel: Recent Labs  Lab 02/22/23 0800 02/23/23 0042  NA 140 138  K 3.8 3.5  CL 108 110  CO2 24 20*  GLUCOSE 116* 104*  BUN 13 12  CREATININE 0.95 0.92  CALCIUM 9.6 8.9   GFR: Estimated Creatinine Clearance: 90.6 mL/min (by C-G formula based on SCr of 0.92  mg/dL). Liver Function Tests: Recent Labs  Lab 02/22/23 0800 02/23/23 0042  AST 25 26  ALT 34 35  ALKPHOS 145* 121  BILITOT 0.5 0.7  PROT 7.0 6.5  ALBUMIN 4.2 3.5   Recent Labs  Lab 02/22/23 0800  LIPASE 48   No results for input(s): "AMMONIA" in the last 168 hours. Coagulation Profile: No results for input(s): "INR", "PROTIME" in the last 168 hours. BNP (last 3 results) No results for input(s): "PROBNP" in the last 8760 hours. HbA1C: Recent Labs    02/23/23 0042  HGBA1C 5.5   CBG: No results for input(s): "GLUCAP" in the last 168 hours. Lipid Profile: Recent Labs    02/23/23 0042  CHOL 127  HDL 34*  LDLCALC 78  TRIG 77  CHOLHDL 3.7   Thyroid Function Tests: No results for input(s): "TSH", "T4TOTAL", "FREET4", "T3FREE", "THYROIDAB" in the last 72 hours. Sepsis Labs: No results for input(s): "PROCALCITON", "LATICACIDVEN" in the last 168 hours.  No results found for this or any previous visit (from the past 240 hour(s)).  Antimicrobials: Anti-infectives (From admission, onward)    None      Culture/Microbiology    Component Value Date/Time   SDES THROAT 12/07/2015 1813   SPECREQUEST NONE 12/07/2015 1813   CULT NO GROUP A STREP (S.PYOGENES) ISOLATED 12/07/2015 1813   REPTSTATUS 12/10/2015 FINAL 12/07/2015 1813     Radiology Studies: CT Angio Chest PE W/Cm &/Or Wo Cm  Result Date: 02/22/2023 CLINICAL DATA:  Pulmonary embolism (PE) suspected, low to intermediate prob, positive D-dimer EXAM: CT ANGIOGRAPHY CHEST WITH CONTRAST TECHNIQUE: Multidetector CT imaging of the chest was performed using the standard protocol during bolus administration of intravenous contrast. Multiplanar CT image reconstructions and MIPs were obtained to evaluate the vascular anatomy. RADIATION DOSE REDUCTION: This exam was performed according to the departmental dose-optimization program which includes automated exposure control, adjustment of the mA and/or kV according to patient  size and/or use of iterative reconstruction technique. CONTRAST:  OMNIPAQUE IOHEXOL 350 MG/ML SOLN COMPARISON:  CT angiography chest from 02/20/2018. FINDINGS: Cardiovascular: No evidence of embolism to the proximal subsegmental pulmonary artery level. Normal cardiac size. No pericardial effusion. No aortic aneurysm. Mediastinum/Nodes: Visualized thyroid gland appears grossly unremarkable. No solid / cystic mediastinal masses. The esophagus is nondistended precluding optimal assessment. There are multiple enlarged mediastinal lymph nodes, increased in size since the prior study from 02/20/2018. When measured in similar fashion, the largest in the subcarinal location measuring up to 13 x 22 mm (previously 8 x 12 mm), largest in the superior left hilum measuring up to 16 x 25 mm (previously 30 x 21 mm) and largest in the right hilum measuring up to 16 x 21 mm (previously 11 x 13 mm). Lungs/Pleura: The central tracheo-bronchial tree is patent. There are dependent changes in bilateral lungs. No mass or consolidation. No pleural effusion or pneumothorax. No suspicious lung nodules. Upper Abdomen: Visualized upper abdominal viscera within normal limits. Musculoskeletal: There are multiple mildly enlarged gastrohepatic, porta hepatis, portacaval and peripancreatic lymph nodes. The visualized soft tissues of the chest wall are grossly unremarkable. No suspicious osseous lesions. There are mild multilevel degenerative changes in the visualized spine. Review of the MIP images confirms the above findings. IMPRESSION: 1. No evidence of pulmonary embolism. 2. Interval increase in size of mediastinal and hilar lymphadenopathy as described above. Multiple mildly enlarged upper abdominal lymph nodes as well. Findings are nonspecific and differential diagnosis includes sarcoidosis, malignancy, etc. Further evaluation with PET-CT scan and tissue sampling is recommended. Electronically Signed   By: Jules Schick M.D.   On:  02/22/2023 10:30   DG Chest Port 1 View  Result Date: 02/22/2023 CLINICAL DATA:  cp EXAM: PORTABLE CHEST 1 VIEW COMPARISON:  CXR 08/07/22 FINDINGS: No pleural effusion. No pneumothorax. Normal cardiac and mediastinal contours. No radiographically apparent displaced rib fractures. Visualized upper abdomen is unremarkable. IMPRESSION: No focal airspace opacity Electronically Signed   By: Lorenza Cambridge M.D.   On: 02/22/2023 08:37     LOS: 0 days   Lanae Boast, MD [facility]  02/23/2023, 1:42 PM

## 2023-02-23 NOTE — Consult Note (Signed)
Cardiology Consultation   Patient ID: Monique Tyler MRN: [id number]; DOB: [birth year]  Admit date: 02/22/2023 Date of Consult: 02/23/2023  PCP:  Hoy Register, MD   [facility] Providers Cardiologist:  None        Patient Profile:   Monique Tyler is a 57 y.o. female with a hx of hypertension, hyperlipidemia and morbid obesity  who is being seen 02/23/2023 for the evaluation of chest pain at the request of Dr. Janalyn Shy.  History of Present Illness:   Monique Tyler presented to the [facility] ED with symptoms of sudden onset chest pain/pressure. This began around 7:30 on the morning of 02/22/23 and persistent without provocation or palliation including with tylenol. In the ED, patient found with WBC 6, RBC 4.64, hemoglobin 12.7 and platelet 302. Also noted with elevated alk phos at 145 and d-dimer 1.25. HS troponin negative x2 and EKG without acute ischemic changes. Given elevated D-dimer, she underwent CTA chest which did not find evidence of PE. This study did note interval increase in size of mediastinal and hilar lymphadenopathy as well as mildly enlarged upper abdominal lymph nodes. PET scan and tissue biopsy recommended.  Patient has previously seen cardiology for evaluation of palpitations and underwent a myoview stress test last year that was negative for ischemia or infarct.   Today on my exam patient clarifies the symptoms that brought her to the ED yesterday. She says she woke up early to drop her son off at dialysis and then proceeded to the grocery store. As she walked from her car inside, she developed central chest tightness with radiation to her back and some associated shortness of breath. No clear worsening of symptoms with exertion but also not relieved with rest. Since arriving to the ED, patient has continued with chest discomfort and notes that pain seems to worsen when she inhales, especially a deep breath. She received a GI cocktail without significant change to  symptoms. Also received morphine that did provide some temporary relief. Patient denies recent chest pain aside from yesterday. She gets regular exercise (trying to lose weight prior to right knee replacement) and admits to stable exertional dyspnea but has never noted chest discomfort while working out. Denies orthopnea or lower extremity edema. She does have OSA but does not currently use CPAP at night. Denies palpitations, lightheadedness/pre-syncope symptoms. No recent GI symptoms.    Past Medical History:  Diagnosis Date   Asthma    GERD (gastroesophageal reflux disease)    Hyperlipidemia    Hypertension    pt denies a hx of HTN   Kidney stones    Lung nodules    Migraine    Vertigo     Past Surgical History:  Procedure Laterality Date   CESAREAN SECTION     MANDIBLE SURGERY     TUBAL LIGATION       Home Medications:  Prior to Admission medications   Medication Sig Start Date End Date Taking? Authorizing Provider  acetaminophen (TYLENOL) 500 MG tablet Take 500 mg by mouth every 6 (six) hours as needed.   Yes [provider]  albuterol (VENTOLIN HFA) 108 (90 Base) MCG/ACT inhaler Inhale 2 puffs into the lungs every 4 (four) hours as needed for wheezing or shortness of breath. 11/30/20  Yes Hoy Register, MD  amLODipine (NORVASC) 5 MG tablet Take 1 tablet (5 mg total) by mouth daily. 02/15/23  Yes Hoy Register, MD  Ascorbic Acid (VITAMIN C) 1000 MG tablet Take 1 tablet (1,000 mg total) by mouth  daily. 08/30/22  Yes Hoy Register, MD  atorvastatin (LIPITOR) 40 MG tablet Take 1 tablet (40 mg total) by mouth daily. 02/15/23  Yes Hoy Register, MD  cetirizine (ZYRTEC) 10 MG tablet Take 1 tablet (10 mg total) by mouth daily. 11/03/19  Yes Hoy Register, MD  Cholecalciferol (VITAMIN D) 50 MCG (2000 UT) CAPS Take 1 capsule (2,000 Units total) by mouth daily. 03/16/21  Yes Georgian Co M, PA-C  FLUoxetine (PROZAC) 40 MG capsule Take 1 capsule (40 mg total) by mouth daily.  02/15/23  Yes Newlin, Odette Horns, MD  fluticasone (FLONASE) 50 MCG/ACT nasal spray Place 2 sprays into both nostrils daily. 11/03/19  Yes Hoy Register, MD  gabapentin (NEURONTIN) 300 MG capsule Take 1 capsule (300 mg total) by mouth at bedtime. 02/15/23  Yes Hoy Register, MD  hydrOXYzine (ATARAX) 25 MG tablet Take 1 tablet (25 mg total) by mouth 3 (three) times daily as needed (Increase night time dose 50 mg). 02/15/23  Yes Hoy Register, MD  meloxicam (MOBIC) 7.5 MG tablet Take 1 tablet (7.5 mg total) by mouth daily. 08/17/22  Yes Hoy Register, MD  topiramate (TOPAMAX) 100 MG tablet Take 1 tablet (100 mg total) by mouth at bedtime. 02/15/23  Yes Hoy Register, MD    Inpatient Medications: Scheduled Meds:  amLODipine  5 mg Oral Daily   aspirin  81 mg Oral Daily   atorvastatin  40 mg Oral Daily   FLUoxetine  40 mg Oral Daily   gabapentin  300 mg Oral QHS   sodium chloride flush  3 mL Intravenous Q12H   Continuous Infusions:  sodium chloride     PRN Meds: sodium chloride, acetaminophen **OR** acetaminophen, albuterol, fluticasone, hydrALAZINE, hydrOXYzine, morphine injection, ondansetron **OR** ondansetron (ZOFRAN) IV, sodium chloride flush  Allergies:    Allergies  Allergen Reactions   Shellfish Allergy Swelling   Pork-Derived Products Nausea And Vomiting    Social History:   Social History   Socioeconomic History   Marital status: Married    Spouse name: Not on file   Number of children: 7   Years of education: Not on file   Highest education level: Not on file  Occupational History   Not on file  Tobacco Use   Smoking status: Former    Types: Cigarettes    Passive exposure: Never   Smokeless tobacco: Never  Vaping Use   Vaping status: Never Used  Substance and Sexual Activity   Alcohol use: No    Alcohol/week: 0.0 standard drinks of alcohol   Drug use: No   Sexual activity: Yes    Birth control/protection: Surgical  Other Topics Concern   Not on file   Social History Narrative   Not on file   Social Determinants of Health   Financial Resource Strain: Not on file  Food Insecurity: No Food Insecurity (02/22/2023)   Hunger Vital Sign    Worried About Running Out of Food in the Last Year: Never true    Ran Out of Food in the Last Year: Never true  Transportation Needs: No Transportation Needs (02/22/2023)   PRAPARE - Administrator, Civil Service (Medical): No    Lack of Transportation (Non-Medical): No  Physical Activity: Not on file  Stress: Not on file  Social Connections: Not on file  Intimate Partner Violence: Not At Risk (02/22/2023)   Humiliation, Afraid, Rape, and Kick questionnaire    Fear of Current or Ex-Partner: No    Emotionally Abused: No    Physically Abused:  No    Sexually Abused: No    Family History:    Family History  Problem Relation Age of Onset   Hypertension Daughter    Hypertension Son      ROS:  Please see the history of present illness.   All other ROS reviewed and negative.     Physical Exam/Data:   Vitals:   02/22/23 1905 02/22/23 1939 02/22/23 2309 02/23/23 0340  BP: (!) 140/72  136/71 137/81  Pulse: (!) 53  61 (!) 55  Resp: 20  14 14   Temp: 98 F (36.7 C)  98.5 F (36.9 C) 98 F (36.7 C)  TempSrc: Oral  Oral Oral  SpO2: 99%  100% 100%  Weight:  122.9 kg    Height:  5\' 7"  (1.702 m)      Intake/Output Summary (Last 24 hours) at 02/23/2023 0930 Last data filed at 02/23/2023 0900 Gross per 24 hour  Intake 503.12 ml  Output 1 ml  Net 502.12 ml      02/22/2023    7:39 PM 02/22/2023    7:50 AM 02/15/2023    9:16 AM  Last 3 Weights  Weight (lbs) 270 lb 15.1 oz 271 lb 2.7 oz 271 lb  Weight (kg) 122.9 kg 123 kg 122.925 kg     Body mass index is 42.44 kg/m.  General:  Well nourished, well developed, in no acute distress HEENT: normal Neck: no JVD Vascular: No carotid bruits; Distal pulses 2+ bilaterally Cardiac:  normal S1, S2; RRR; no murmur  Lungs:  clear to auscultation  bilaterally, no wheezing, rhonchi or rales  Abd: soft, nontender, no hepatomegaly  Ext: no edema Musculoskeletal:  No deformities, BUE and BLE strength normal and equal Skin: warm and dry  Neuro:  CNs 2-12 intact, no focal abnormalities noted Psych:  Normal affect   EKG:  The EKG was personally reviewed and demonstrates:  sinus rhythm without acute ischemic changes Telemetry:  Telemetry was personally reviewed and demonstrates:  sinus rhythm  Relevant CV Studies:  12/13/21 Myoview Stress Test    The study is normal. The study is low risk.   No ST deviation was noted.   Left ventricular function is normal. Nuclear stress EF: 69 %. The left ventricular ejection fraction is hyperdynamic (>65%). End diastolic cavity size is normal.   Prior study not available for comparison.   Low risk stress nuclear study with normal perfusion and normal left ventricular regional and global systolic function.  Laboratory Data:  High Sensitivity Troponin:   Recent Labs  Lab 02/22/23 1004 02/22/23 1820 02/22/23 2101 02/22/23 2243 02/23/23 0042  TROPONINIHS 3 5 4 5 3      Chemistry Recent Labs  Lab 02/22/23 0800 02/23/23 0042  NA 140 138  K 3.8 3.5  CL 108 110  CO2 24 20*  GLUCOSE 116* 104*  BUN 13 12  CREATININE 0.95 0.92  CALCIUM 9.6 8.9  GFRNONAA >60 >60  ANIONGAP 8 8    Recent Labs  Lab 02/22/23 0800 02/23/23 0042  PROT 7.0 6.5  ALBUMIN 4.2 3.5  AST 25 26  ALT 34 35  ALKPHOS 145* 121  BILITOT 0.5 0.7   Lipids  Recent Labs  Lab 02/23/23 0042  CHOL 127  TRIG 77  HDL 34*  LDLCALC 78  CHOLHDL 3.7    Hematology Recent Labs  Lab 02/22/23 0800 02/23/23 0042  WBC 6.0 5.4  RBC 4.64 4.44  HGB 12.7 12.1  HCT 39.9 41.9  MCV 86.0 94.4  MCH  27.4 27.3  MCHC 31.8 28.9*  RDW 13.8 14.0  PLT 302 269   Thyroid No results for input(s): "TSH", "FREET4" in the last 168 hours.  BNPNo results for input(s): "BNP", "PROBNP" in the last 168 hours.  DDimer  Recent Labs  Lab  02/22/23 0803  DDIMER 1.25*     Radiology/Studies:  CT Angio Chest PE W/Cm &/Or Wo Cm  Result Date: 02/22/2023 CLINICAL DATA:  Pulmonary embolism (PE) suspected, low to intermediate prob, positive D-dimer EXAM: CT ANGIOGRAPHY CHEST WITH CONTRAST TECHNIQUE: Multidetector CT imaging of the chest was performed using the standard protocol during bolus administration of intravenous contrast. Multiplanar CT image reconstructions and MIPs were obtained to evaluate the vascular anatomy. RADIATION DOSE REDUCTION: This exam was performed according to the departmental dose-optimization program which includes automated exposure control, adjustment of the mA and/or kV according to patient size and/or use of iterative reconstruction technique. CONTRAST:  OMNIPAQUE IOHEXOL 350 MG/ML SOLN COMPARISON:  CT angiography chest from 02/20/2018. FINDINGS: Cardiovascular: No evidence of embolism to the proximal subsegmental pulmonary artery level. Normal cardiac size. No pericardial effusion. No aortic aneurysm. Mediastinum/Nodes: Visualized thyroid gland appears grossly unremarkable. No solid / cystic mediastinal masses. The esophagus is nondistended precluding optimal assessment. There are multiple enlarged mediastinal lymph nodes, increased in size since the prior study from 02/20/2018. When measured in similar fashion, the largest in the subcarinal location measuring up to 13 x 22 mm (previously 8 x 12 mm), largest in the superior left hilum measuring up to 16 x 25 mm (previously 30 x 21 mm) and largest in the right hilum measuring up to 16 x 21 mm (previously 11 x 13 mm). Lungs/Pleura: The central tracheo-bronchial tree is patent. There are dependent changes in bilateral lungs. No mass or consolidation. No pleural effusion or pneumothorax. No suspicious lung nodules. Upper Abdomen: Visualized upper abdominal viscera within normal limits. Musculoskeletal: There are multiple mildly enlarged gastrohepatic, porta hepatis,  portacaval and peripancreatic lymph nodes. The visualized soft tissues of the chest wall are grossly unremarkable. No suspicious osseous lesions. There are mild multilevel degenerative changes in the visualized spine. Review of the MIP images confirms the above findings. IMPRESSION: 1. No evidence of pulmonary embolism. 2. Interval increase in size of mediastinal and hilar lymphadenopathy as described above. Multiple mildly enlarged upper abdominal lymph nodes as well. Findings are nonspecific and differential diagnosis includes sarcoidosis, malignancy, etc. Further evaluation with PET-CT scan and tissue sampling is recommended. Electronically Signed   By: Jules Schick M.D.   On: 02/22/2023 10:30   DG Chest Port 1 View  Result Date: 02/22/2023 CLINICAL DATA:  cp EXAM: PORTABLE CHEST 1 VIEW COMPARISON:  CXR 08/07/22 FINDINGS: No pleural effusion. No pneumothorax. Normal cardiac and mediastinal contours. No radiographically apparent displaced rib fractures. Visualized upper abdomen is unremarkable. IMPRESSION: No focal airspace opacity Electronically Signed   By: Lorenza Cambridge M.D.   On: 02/22/2023 08:37     Assessment and Plan:   Atypical chest pain  Patient admitted with atypical chest pain that began yesterday morning and seems to worsen with deep breaths. Cardiac workup to this point has been reassuring with negative troponin x2 and non-ischemic ECG.   Although patient continues with chest discomfort this morning, no clear cardiac etiology. Given negative stress test 1 year ago and negative troponin, overall low suspicion for ischemic etiology. Although not ideal for coronary evaluation, no evidence of coronary calcifications on CTA chest PE protocol. Overall, I do not think inpatient stress test  is warranted/would change management.  Agree with echocardiogram as ordered by primary team. If significant LVEF reduction or WMA, would certainly change plans for inpatient evaluation.  Check sed rate as  well.  Will arrange close outpatient follow up.  Patient will need ongoing evaluation for non-cardiac causes of chest pain.  Hypertension  BP stable. Continue home amlodipine 5mg .   Hyperlipidemia  LDL at goal <100. Continue with Atorvastatin 40mg   OSA  Patient not currently compliant with CPAP. Discussed the importance of nightly use.  Per primary team: Mediastinal and hilar lymphadenopathy Chronic pain syndrome GAD   Risk Assessment/Risk Scores:                For questions or updates, please contact [facility] Please consult [email or web address] for contact info under    Signed, Perlie Gold, PA-C  02/23/2023 9:30 AM

## 2023-02-23 NOTE — Plan of Care (Signed)

## 2023-02-23 NOTE — Progress Notes (Signed)
   02/23/23 1515  Spiritual Encounters  Type of Visit Initial  Care provided to: Patient  Referral source Other (comment) (Spiritual Consult)  Reason for visit Advance directives  OnCall Visit No   Chaplain responded to a spiritual consult request for advanced directive education. I met with the patient, Monique Tyler. She was alert and welcoming. We went over all of the forms as well as her options when she completes the documents.   Valerie Roys [facility]  [phone]

## 2023-02-23 NOTE — Consult Note (Signed)
NAME:  Monique Tyler, MRN:  [id number], DOB:  May 21, 1966, LOS: 0 ADMISSION DATE:  02/22/2023, CONSULTATION DATE:  02/23/23 REFERRING MD:  Lanae Boast, MD CHIEF COMPLAINT:  Mediastinal/Hilar Adenopathy   History of Present Illness:  Monique Tyler is a 57 year old woman, never smoker with history of obesity, hypertension and hyperlipidemia who is admitted for sudden onset of chest pain and pressure. D-dimer was elevated and CTA Chest performed which was negative for pulmonary emboli but notable for mediastinal and upper abdominal lymphadenopathy. Cardiology consulted for the chest pain - deemed less likely cardiac in origin as troponins are flat with no ischemic EKG changes.   PCCM consulted for further workup of mediastinal/hilar adenopathy.   She does not report any episodes of chest pain like this previously.  She has sharp substernal chest pain down to the epigastric area through the middle of her back.  She denies any nausea, vomiting or diarrhea.  She denies fevers, chills or night sweats.  Denies any skin rashes or diffuse muscle aches.  She denies any lack of appetite or  unexpected weight loss.  She does have issues with GERD but her current chest discomfort feels different.  She does have some anxiety and denies any history of panic attacks.  Pertinent  Medical History    Significant Hospital Events: Including procedures, antibiotic start and stop dates in addition to other pertinent events     Interim History / Subjective:    Objective   Blood pressure 123/60, pulse 68, temperature 98 F (36.7 C), temperature source Oral, resp. rate 18, height 5\' 7"  (1.702 m), weight 122.9 kg, last menstrual period 01/18/2012, SpO2 99%.        Intake/Output Summary (Last 24 hours) at 02/23/2023 1154 Last data filed at 02/23/2023 1021 Gross per 24 hour  Intake 3 ml  Output 1 ml  Net 2 ml   Filed Weights   02/22/23 0750 02/22/23 1939  Weight: 123 kg 122.9 kg    Examination: General: Middle-aged  woman, no acute distress, resting in bed HENT: [place]/AT, sclera anicteric, moist mucous membranes Lungs: Clear to auscultation bilaterally, no wheezing Cardiovascular: Regular rate and rhythm, no murmurs Abdomen: Soft, nontender, nondistended bowel sounds present Extremities: Warm, no edema Neuro: Alert, oriented, moving all extremities GU: N/A   CTA Chest 02/22/23 Mediastinum/Nodes: Visualized thyroid gland appears grossly unremarkable. No solid / cystic mediastinal masses. The esophagus is nondistended precluding optimal assessment.   There are multiple enlarged mediastinal lymph nodes, increased in size since the prior study from 02/20/2018. When measured in similar fashion, the largest in the subcarinal location measuring up to 13 x 22 mm (previously 8 x 12 mm), largest in the superior left hilum measuring up to 16 x 25 mm (previously 30 x 21 mm) and largest in the right hilum measuring up to 16 x 21 mm (previously 11 x 13 mm).   Lungs/Pleura: The central tracheo-bronchial tree is patent. There are dependent changes in bilateral lungs. No mass or consolidation. No pleural effusion or pneumothorax. No suspicious lung nodules.  Resolved Hospital Problem list     Assessment & Plan:  Mediastinal/Hilar Adenopathy Atypical Chest Pain Hx of GERD  Plan: - Differential includes inflammatory condition such as Sarcoidosis vs possible malignancy - check ACE, lysozyme levels - check CT Abd/pelvis with IV contrast to evaluate for further lymphadenopathy - Based on CT findings will determine whether bronchoscopy vs IR guided biopsy is most beneficial - Try GI cocktail with antacid medication and IV pantoprazole to  see if any relief of her chest discomfort occurs  PCCM will continue to follow.  Best Practice (right click and "Reselect all SmartList Selections" daily)   Per primary  Labs   CBC: Recent Labs  Lab 02/22/23 0800 02/23/23 0042  WBC 6.0 5.4  HGB 12.7 12.1  HCT 39.9  41.9  MCV 86.0 94.4  PLT 302 269    Basic Metabolic Panel: Recent Labs  Lab 02/22/23 0800 02/23/23 0042  NA 140 138  K 3.8 3.5  CL 108 110  CO2 24 20*  GLUCOSE 116* 104*  BUN 13 12  CREATININE 0.95 0.92  CALCIUM 9.6 8.9   GFR: Estimated Creatinine Clearance: 90.6 mL/min (by C-G formula based on SCr of 0.92 mg/dL). Recent Labs  Lab 02/22/23 0800 02/23/23 0042  WBC 6.0 5.4    Liver Function Tests: Recent Labs  Lab 02/22/23 0800 02/23/23 0042  AST 25 26  ALT 34 35  ALKPHOS 145* 121  BILITOT 0.5 0.7  PROT 7.0 6.5  ALBUMIN 4.2 3.5   Recent Labs  Lab 02/22/23 0800  LIPASE 48   No results for input(s): "AMMONIA" in the last 168 hours.  ABG    Component Value Date/Time   TCO2 25 10/29/2013 2019   O2SAT 78.0 06/21/2015 1005     Coagulation Profile: No results for input(s): "INR", "PROTIME" in the last 168 hours.  Cardiac Enzymes: No results for input(s): "CKTOTAL", "CKMB", "CKMBINDEX", "TROPONINI" in the last 168 hours.  HbA1C: HbA1c, POC (controlled diabetic range)  Date/Time Value Ref Range Status  02/15/2023 09:22 AM 5.5 0.0 - 7.0 % Final   Hgb A1c MFr Bld  Date/Time Value Ref Range Status  02/23/2023 12:42 AM 5.5 4.8 - 5.6 % Final    Comment:    (NOTE) Pre diabetes:          5.7%-6.4%  Diabetes:              >6.4%  Glycemic control for   <7.0% adults with diabetes   08/17/2022 09:25 AM 5.7 (H) 4.8 - 5.6 % Final    Comment:             Prediabetes: 5.7 - 6.4          Diabetes: >6.4          Glycemic control for adults with diabetes: <7.0     CBG: No results for input(s): "GLUCAP" in the last 168 hours.  Review of Systems:   Review of Systems  Constitutional:  Negative for chills, fever, malaise/fatigue and weight loss.  HENT:  Negative for congestion, sinus pain and sore throat.   Eyes: Negative.   Respiratory:  Positive for shortness of breath. Negative for cough, hemoptysis, sputum production and wheezing.   Cardiovascular:   Positive for chest pain. Negative for palpitations, orthopnea, claudication and leg swelling.  Gastrointestinal:  Negative for abdominal pain, heartburn, nausea and vomiting.  Genitourinary: Negative.   Musculoskeletal:  Negative for joint pain and myalgias.  Skin:  Negative for rash.  Neurological:  Negative for weakness.  Endo/Heme/Allergies: Negative.   Psychiatric/Behavioral: Negative.       Past Medical History:  She,  has a past medical history of Asthma, GERD (gastroesophageal reflux disease), Hyperlipidemia, Hypertension, Kidney stones, Lung nodules, Migraine, and Vertigo.   Surgical History:   Past Surgical History:  Procedure Laterality Date   CESAREAN SECTION     MANDIBLE SURGERY     TUBAL LIGATION       Social History:  reports that she has quit smoking. Her smoking use included cigarettes. She has never been exposed to tobacco smoke. She has never used smokeless tobacco. She reports that she does not drink alcohol and does not use drugs.   Family History:  Her family history includes Hypertension in her daughter and son.   Allergies Allergies  Allergen Reactions   Shellfish Allergy Swelling   Pork-Derived Products Nausea And Vomiting     Home Medications  Prior to Admission medications   Medication Sig Start Date End Date Taking? Authorizing Provider  acetaminophen (TYLENOL) 500 MG tablet Take 500 mg by mouth every 6 (six) hours as needed.   Yes [provider]  albuterol (VENTOLIN HFA) 108 (90 Base) MCG/ACT inhaler Inhale 2 puffs into the lungs every 4 (four) hours as needed for wheezing or shortness of breath. 11/30/20  Yes Hoy Register, MD  amLODipine (NORVASC) 5 MG tablet Take 1 tablet (5 mg total) by mouth daily. 02/15/23  Yes Hoy Register, MD  Ascorbic Acid (VITAMIN C) 1000 MG tablet Take 1 tablet (1,000 mg total) by mouth daily. 08/30/22  Yes Hoy Register, MD  atorvastatin (LIPITOR) 40 MG tablet Take 1 tablet (40 mg total) by mouth daily.  02/15/23  Yes Hoy Register, MD  cetirizine (ZYRTEC) 10 MG tablet Take 1 tablet (10 mg total) by mouth daily. 11/03/19  Yes Hoy Register, MD  Cholecalciferol (VITAMIN D) 50 MCG (2000 UT) CAPS Take 1 capsule (2,000 Units total) by mouth daily. 03/16/21  Yes Georgian Co M, PA-C  FLUoxetine (PROZAC) 40 MG capsule Take 1 capsule (40 mg total) by mouth daily. 02/15/23  Yes Newlin, Odette Horns, MD  fluticasone (FLONASE) 50 MCG/ACT nasal spray Place 2 sprays into both nostrils daily. 11/03/19  Yes Hoy Register, MD  gabapentin (NEURONTIN) 300 MG capsule Take 1 capsule (300 mg total) by mouth at bedtime. 02/15/23  Yes Hoy Register, MD  hydrOXYzine (ATARAX) 25 MG tablet Take 1 tablet (25 mg total) by mouth 3 (three) times daily as needed (Increase night time dose 50 mg). 02/15/23  Yes Hoy Register, MD  meloxicam (MOBIC) 7.5 MG tablet Take 1 tablet (7.5 mg total) by mouth daily. 08/17/22  Yes Hoy Register, MD  topiramate (TOPAMAX) 100 MG tablet Take 1 tablet (100 mg total) by mouth at bedtime. 02/15/23  Yes Hoy Register, MD     Critical care time: n/a    Melody Comas, MD [facility] Office: [phone]   See Amion for personal pager PCCM on call pager [phone] until 7pm. Please call Elink 7p-7a. [phone]

## 2023-02-24 LAB — URINALYSIS, W/ REFLEX TO CULTURE (INFECTION SUSPECTED)
Bilirubin Urine: NEGATIVE
Glucose, UA: NEGATIVE mg/dL
Ketones, ur: NEGATIVE mg/dL
Nitrite: POSITIVE — AB
Protein, ur: NEGATIVE mg/dL
Specific Gravity, Urine: 1.013 (ref 1.005–1.030)
WBC, UA: >50 WBC/hpf (ref 0–5)
pH: 5 (ref 5.0–8.0)

## 2023-02-24 MED ORDER — KETOROLAC TROMETHAMINE 30 MG/ML IJ SOLN
30.0000 mg | Freq: Once | INTRAMUSCULAR | Status: AC
  Administered 2023-02-24: 30 mg via INTRAVENOUS
  Filled 2023-02-24: qty 1

## 2023-02-24 MED ORDER — PROCHLORPERAZINE EDISYLATE 10 MG/2ML IJ SOLN
10.0000 mg | Freq: Once | INTRAMUSCULAR | Status: AC
  Administered 2023-02-24: 10 mg via INTRAVENOUS
  Filled 2023-02-24: qty 2

## 2023-02-24 MED ORDER — POLYETHYLENE GLYCOL 3350 17 G PO PACK
17.0000 g | PACK | Freq: Every day | ORAL | Status: DC | PRN
  Administered 2023-02-24 – 2023-02-25 (×2): 17 g via ORAL
  Filled 2023-02-24 (×2): qty 1

## 2023-02-24 MED ORDER — SODIUM CHLORIDE 0.9 % IV SOLN
1.0000 g | INTRAVENOUS | Status: DC
  Administered 2023-02-24 – 2023-02-25 (×2): 1 g via INTRAVENOUS
  Filled 2023-02-24 (×2): qty 10

## 2023-02-24 MED ORDER — TOPIRAMATE 100 MG PO TABS
100.0000 mg | ORAL_TABLET | Freq: Every day | ORAL | Status: DC
  Administered 2023-02-24 – 2023-02-25 (×2): 100 mg via ORAL
  Filled 2023-02-24 (×2): qty 1

## 2023-02-24 MED ORDER — DIPHENHYDRAMINE HCL 50 MG/ML IJ SOLN
25.0000 mg | Freq: Once | INTRAMUSCULAR | Status: AC
  Administered 2023-02-24: 25 mg via INTRAVENOUS
  Filled 2023-02-24: qty 1

## 2023-02-24 NOTE — Plan of Care (Signed)

## 2023-02-24 NOTE — Progress Notes (Signed)
   Rounding Note    Patient Name: Monique Tyler Date of Encounter: 02/24/2023  [facility] Cardiologist:   Duke Salvia  Pt has atypical CP  Echo shows normal LVEF     Cardiology will sign off     Signed, Kristeen Miss, MD  02/24/2023, 10:20 AM

## 2023-02-24 NOTE — Progress Notes (Addendum)
  Progress Note   Patient: Monique Tyler ZOX:[id number] DOB: [birth year] DOA: 02/22/2023     1 DOS: the patient was seen and examined on 02/24/2023 at 1:50PM      Brief hospital course: 57 y.o. F w/ HTN, HLD, MO who prsented with acute chest pressure and pleuritic pain.  In the ER, CTA chest showed increasing mediastinal and hilar lymphadenopathy.         Assessment and Plan: Chest pain likely due to mediastinal and hilar lymphadenopathy Mediastinal and hilar lymphadenopathy Evaluated by Cardiology, negative enzymes, echo unremarkable, ACS ruled out.  Pericarditis doubted.    Clinically did not improve with Maalox, GI cocktail.  CTA ruled out PE, pneumonia, pneumothorax, dissection. The only abnormal finding is the lymphadenopathy, which I assume would cause pressure and pleuritic pain (just not acutely).  Pulm were consulted and plan for EBUS/bronchoscopy on Monday, then discharge - Consult Pulmonology   Hypertension -Continue amlodipine  Hyperlipidemia -Continue atorvastatin, low-dose aspirin  Morbid obesity BMI 42  OSA -CPAP at night  Migraines Having headache today - Resume Topamax - Give one-time Benadryl, Compazine, and Toradol  Anxiety -Continue Prozac  Neuropathy -Continue gabapentin  Renal stone CT shows stranding around the renal pelvis where the nephrolithiasis is.  Unclear significance. - Check UA and reflex culture  ADDENDUM: UA with nitrites, pyuria.  Culture added.   - Start Rocephin        Subjective: Patient had a headache this morning, the (afternoon.  She still has pleuritic discomfort and chest heaviness, unchanged.  No  confusion, no fever, no nursing concerns     Physical Exam: BP (!) 108/55 (BP Location: Right Arm)   Pulse (!) 59   Temp 98.3 F (36.8 C) (Oral)   Resp 16   Ht 5\' 7"  (1.702 m)   Wt 123.9 kg   LMP 01/18/2012   SpO2 97%   BMI 42.78 kg/m   Elderly adult female, lying in bed, interactive and appropriate RRR,  no murmurs, no peripheral edema Respiratory rate normal, lungs clear without rales or wheezes Abdomen soft no tenderness palpation or guarding Attention normal, affect normal, judgment insight appear normal    Data Reviewed: Discussed with pulmonology and cardiology CTA chest, reviewed above CT abdomen and pelvis showed a small left renal stone with some perinephric edema Basic metabolic panel unremarkable LDL 78 CRP normal CBC normal Echocardiogram unremarkable  Family Communication: Daughter at bedside    Disposition: Status is: Inpatient Patient was admitted for chest pain.  Found to have chest lymphadenopathy.  Pulmonology have asked [place] to keep her over the weekend and will perform EBUS on Monday and she can discharge home after.        Author: Alberteen Sam, MD 02/24/2023 6:03 PM  For on call review [email or web address].

## 2023-02-25 DIAGNOSIS — N200 Calculus of kidney: Secondary | ICD-10-CM

## 2023-02-25 LAB — BASIC METABOLIC PANEL WITH GFR
Anion gap: 8 (ref 5–15)
BUN: 14 mg/dL (ref 6–20)
CO2: 25 mmol/L (ref 22–32)
Calcium: 9.2 mg/dL (ref 8.9–10.3)
Chloride: 106 mmol/L (ref 98–111)
Creatinine, Ser: 1.01 mg/dL — ABNORMAL HIGH (ref 0.44–1.00)
GFR, Estimated: >60 mL/min (ref >60)
Glucose, Bld: 106 mg/dL — ABNORMAL HIGH (ref 70–99)
Potassium: 3.8 mmol/L (ref 3.5–5.1)
Sodium: 139 mmol/L (ref 135–145)

## 2023-02-25 MED ORDER — BISACODYL 10 MG RE SUPP
10.0000 mg | Freq: Once | RECTAL | Status: AC
  Administered 2023-02-25: 10 mg via RECTAL
  Filled 2023-02-25: qty 1

## 2023-02-25 MED ORDER — PANTOPRAZOLE SODIUM 40 MG PO TBEC
40.0000 mg | DELAYED_RELEASE_TABLET | Freq: Every day | ORAL | Status: DC
  Administered 2023-02-25: 40 mg via ORAL
  Filled 2023-02-25: qty 1

## 2023-02-25 MED ORDER — ORAL CARE MOUTH RINSE: 15.0000 mL | OROMUCOSAL | Status: DC | PRN

## 2023-02-25 MED ORDER — PNEUMOCOCCAL 20-VAL CONJ VACC 0.5 ML IM SUSY
0.5000 mL | PREFILLED_SYRINGE | INTRAMUSCULAR | Status: AC
  Administered 2023-02-26: 0.5 mL via INTRAMUSCULAR
  Filled 2023-02-25: qty 0.5

## 2023-02-25 NOTE — Progress Notes (Signed)
Progress Note   Patient: Monique Tyler QIO:[id number] DOB: [birth year] DOA: 02/22/2023     2 DOS: the patient was seen and examined on 02/25/2023   Brief hospital course: Alaria Oconnor is 57 y.o. F w/ hypertension, hyperlipidemia, morbid obesity who prsented with acute chest pressure and pleuritic pain.  In the ER, CTA chest showed increasing mediastinal and hilar lymphadenopathy.   Cardiac work up so far negative seen by cardiology recommended no further work up. Pulmonary recommended Bronch/ EBUS for evaluation. She has renal stone with abnormal urine, started rocephin.    Assessment and Plan: Chest pain likely due to mediastinal and hilar lymphadenopathy Mediastinal and hilar lymphadenopathy Non cardiac chest pain. Evaluated by Cardiology, negative enzymes, echo unremarkable, ACS ruled out.  Pericarditis doubted.   Clinically did not improve with Maalox, GI cocktail. Abnormal finding is the lymphadenopathy, likely causing her pain. Pulmonary team planned for EBUS/bronchoscopy on Monday, and possible discharge  Renal stone CT shows edema around the renal pelvis where the stone is.  Urinalysis abnormal. Continue Rocephin therapy. Follow cultures. Will discuss with Urology.  Hypertension Continue amlodipine   Hyperlipidemia Continue atorvastatin, low-dose aspirin   Morbid obesity BMI 42. Diet, exercise and weight reduction advised.   OSA CPAP at night order placed.   Migraines Resumed Topamax   Anxiety Continue Prozac   Neuropathy Continue gabapentin       Subjective: Patient is seen and examined today morning. She is lying in bed, still has constant chest discomfort but less, pressure like feeling. No radiation, nausea, vomiting or abdominal pain.  Physical Exam: Vitals:   02/24/23 1216 02/24/23 2031 02/24/23 2120 02/25/23 0442  BP: (!) 108/55 113/74 103/66 111/64  Pulse: (!) 59 (!) 59 65 79  Resp: 16 18 13  (!) 21  Temp: 98.3 F (36.8 C) 98 F (36.7 C)  98.3 F  (36.8 C)  TempSrc: Oral   Oral  SpO2: 97% 97% 97% 96%  Weight:      Height:       General - Middle aged obese African American female, no apparent distress HEENT - PERRLA, EOMI, atraumatic head, non tender sinuses. Lung - Clear, rales, rhonchi, wheezes. Heart - S1, S2 heard, no murmurs, rubs, no pedal edema Neuro - Alert, awake and oriented, non focal exam. Skin - Warm and dry.  Data Reviewed:     Latest Ref Rng & Units 02/23/2023   12:42 AM 02/22/2023    8:00 AM 02/15/2023   10:05 AM  CBC  WBC 4.0 - 10.5 K/uL 5.4  6.0  4.6   Hemoglobin 12.0 - 15.0 g/dL 32.4  40.1  02.7   Hematocrit 36.0 - 46.0 % 41.9  39.9  40.0   Platelets 150 - 400 K/uL 269  302  306       Latest Ref Rng & Units 02/25/2023    4:37 AM 02/24/2023    5:00 AM 02/23/2023   12:42 AM  BMP  Glucose 70 - 99 mg/dL 253  664  403   BUN 6 - 20 mg/dL 14  11  12    Creatinine 0.44 - 1.00 mg/dL 4.74  2.59  5.63   Sodium 135 - 145 mmol/L 139  138  138   Potassium 3.5 - 5.1 mmol/L 3.8  3.8  3.5   Chloride 98 - 111 mmol/L 106  107  110   CO2 22 - 32 mmol/L 25  25  20    Calcium 8.9 - 10.3 mg/dL 9.2  8.9  8.9  CT ABDOMEN PELVIS W CONTRAST  Result Date: 02/23/2023 CLINICAL DATA:  Adenopathy. EXAM: CT ABDOMEN AND PELVIS WITH CONTRAST TECHNIQUE: Multidetector CT imaging of the abdomen and pelvis was performed using the standard protocol following bolus administration of intravenous contrast. RADIATION DOSE REDUCTION: This exam was performed according to the departmental dose-optimization program which includes automated exposure control, adjustment of the mA and/or kV according to patient size and/or use of iterative reconstruction technique. CONTRAST:  OMNIPAQUE IOHEXOL 300 MG/ML  SOLN COMPARISON:  Chest CT 02/22/2023 FINDINGS: Lower chest: Mediastinal and hilar lymphadenopathy evident, better characterized on chest CTA yesterday. Hepatobiliary: No suspicious focal abnormality within the liver parenchyma. There is no evidence for  gallstones, gallbladder wall thickening, or pericholecystic fluid. No intrahepatic or extrahepatic biliary dilation. Pancreas: No focal mass lesion. No dilatation of the main duct. No intraparenchymal cyst. No peripancreatic edema. Spleen: No splenomegaly. No suspicious focal mass lesion. Adrenals/Urinary Tract: No adrenal nodule or mass. Punctate nonobstructing stone seen upper pole right kidney. 3 tiny stones are seen in the left kidney. 6 x 3 x 6 mm stone is identified in the left renal pelvis. There is some mild peripelvic edema without overt left hydronephrosis. No evidence for hydroureter. The urinary bladder appears normal for the degree of distention. Stomach/Bowel: Stomach is unremarkable. No gastric wall thickening. No evidence of outlet obstruction. Duodenum is normally positioned as is the ligament of Treitz. No small bowel wall thickening. No small bowel dilatation. The terminal ileum is normal. The appendix is normal. No gross colonic mass. No colonic wall thickening. Vascular/Lymphatic: No abdominal aortic aneurysm. No abdominal aortic atherosclerotic calcification. 8 mm short axis gastrohepatic ligament lymph node evident. 10 mm short axis portal caval node identified on 33/2. No mesenteric lymphadenopathy. No retroperitoneal lymphadenopathy. No pelvic sidewall lymphadenopathy. Reproductive: Unremarkable. Other: No intraperitoneal free fluid. Musculoskeletal: No worrisome lytic or sclerotic osseous abnormality. IMPRESSION: 1. 6 x 3 x 6 mm stone in the left renal pelvis with some mild peripelvic edema but no overt left hydronephrosis. 2. Additional tiny nonobstructing stones in both kidneys. 3. Mediastinal and hilar lymphadenopathy, better characterized on chest CTA performed yesterday. 4. Upper normal gastrohepatic ligament and portal caval lymph nodes. No overt lymphadenopathy in the abdomen or pelvis. Electronically Signed   By: Kennith Center M.D.   On: 02/23/2023 19:20   ECHOCARDIOGRAM  COMPLETE  Result Date: 02/23/2023    ECHOCARDIOGRAM REPORT   Patient Name:   SONAL DORWART Date of Exam: 02/23/2023 Medical Rec #:  [id number]     Height:       67.0 in Accession #:    [id number]    Weight:       270.9 lb Date of Birth:  1966-03-10     BSA:          2.301 m Patient Age:    56 years      BP:           123/60 mmHg Patient Gender: F             HR:           56 bpm. Exam Location:  Inpatient Procedure: 2D Echo, Cardiac Doppler and Color Doppler Indications:    Chest pain R07.9  History:        Patient has prior history of Echocardiogram examinations, most                 recent 05/02/2018. Signs/Symptoms:Chest Pain; Risk Factors:Sleep  Apnea, Former Smoker, Hypertension and Dyslipidemia.  Sonographer:    Dondra Prader RVT RCS Referring Phys: [phone] SUBRINA SUNDIL IMPRESSIONS  1. Left ventricular ejection fraction, by estimation, is 65 to 70%. The left ventricle has hyperdynamic function. The left ventricle has no regional wall motion abnormalities. Left ventricular diastolic parameters were normal.  2. Right ventricular systolic function is normal. The right ventricular size is normal. Tricuspid regurgitation signal is inadequate for assessing PA pressure.  3. The mitral valve is normal in structure. Trivial mitral valve regurgitation. No evidence of mitral stenosis.  4. The aortic valve is tricuspid. Aortic valve regurgitation is not visualized. No aortic stenosis is present.  5. The inferior vena cava is normal in size with <50% respiratory variability, suggesting right atrial pressure of 8 mmHg. FINDINGS  Left Ventricle: Left ventricular ejection fraction, by estimation, is 65 to 70%. The left ventricle has hyperdynamic function. The left ventricle has no regional wall motion abnormalities. The left ventricular internal cavity size was normal in size. There is no left ventricular hypertrophy. Left ventricular diastolic parameters were normal. Right Ventricle: The right ventricular size  is normal. No increase in right ventricular wall thickness. Right ventricular systolic function is normal. Tricuspid regurgitation signal is inadequate for assessing PA pressure. Left Atrium: Left atrial size was normal in size. Right Atrium: Right atrial size was normal in size. Pericardium: There is no evidence of pericardial effusion. Mitral Valve: The mitral valve is normal in structure. Trivial mitral valve regurgitation. No evidence of mitral valve stenosis. Tricuspid Valve: The tricuspid valve is normal in structure. Tricuspid valve regurgitation is not demonstrated. Aortic Valve: The aortic valve is tricuspid. Aortic valve regurgitation is not visualized. No aortic stenosis is present. Aortic valve mean gradient measures 4.0 mmHg. Aortic valve peak gradient measures 6.5 mmHg. Aortic valve area, by VTI measures 2.62 cm. Pulmonic Valve: The pulmonic valve was normal in structure. Pulmonic valve regurgitation is not visualized. Aorta: The aortic root is normal in size and structure. Venous: The inferior vena cava is normal in size with less than 50% respiratory variability, suggesting right atrial pressure of 8 mmHg. IAS/Shunts: No atrial level shunt detected by color flow Doppler.  LEFT VENTRICLE PLAX 2D LVIDd:         4.60 cm   Diastology LVIDs:         1.90 cm   LV e' medial:    6.96 cm/s LV PW:         1.10 cm   LV E/e' medial:  12.5 LV IVS:        1.00 cm   LV e' lateral:   8.81 cm/s LVOT diam:     2.00 cm   LV E/e' lateral: 9.9 LV SV:         76 LV SV Index:   33 LVOT Area:     3.14 cm  RIGHT VENTRICLE             IVC RV S prime:     13.40 cm/s  IVC diam: 1.90 cm TAPSE (M-mode): 2.4 cm LEFT ATRIUM             Index        RIGHT ATRIUM           Index LA diam:        3.40 cm 1.48 cm/m   RA Area:     12.90 cm LA Vol (A2C):   38.2 ml 16.60 ml/m  RA Volume:   33.80 ml  14.69 ml/m LA Vol (A4C):   32.1 ml 13.95 ml/m LA Biplane Vol: 36.0 ml 15.65 ml/m  AORTIC VALVE AV Area (Vmax):    2.72 cm AV Area  (Vmean):   2.57 cm AV Area (VTI):     2.62 cm AV Vmax:           127.00 cm/s AV Vmean:          88.500 cm/s AV VTI:            0.289 m AV Peak Grad:      6.5 mmHg AV Mean Grad:      4.0 mmHg LVOT Vmax:         110.00 cm/s LVOT Vmean:        72.500 cm/s LVOT VTI:          0.241 m LVOT/AV VTI ratio: 0.83  AORTA Ao Root diam: 3.20 cm Ao Asc diam:  2.70 cm MITRAL VALVE MV Area (PHT): 3.53 cm    SHUNTS MV Decel Time: 215 msec    Systemic VTI:  0.24 m MV E velocity: 87.00 cm/s  Systemic Diam: 2.00 cm MV A velocity: 56.30 cm/s MV E/A ratio:  1.55 Dalton McleanMD Electronically signed by Wilfred Lacy Signature Date/Time: 02/23/2023/4:29:08 PM    Final      Family Communication: Patient and her husband understand and agree with current plan.  Disposition: Status is: Inpatient Remains inpatient appropriate because: pulmonary procedure tomorrow EBUS/ Bronch for lymphadenopathy evaluation.  Planned Discharge Destination: Home    Time spent: 44 minutes  Author: Marcelino Duster, MD 02/25/2023 9:30 AM  For on call review [email or web address].

## 2023-02-25 NOTE — Plan of Care (Signed)

## 2023-02-25 NOTE — Progress Notes (Signed)
NAME:  Monique Tyler, MRN:  [id number], DOB:  [birth year], LOS: 2 ADMISSION DATE:  02/22/2023, CONSULTATION DATE:  02/23/23 REFERRING MD:  Lanae Boast, MD CHIEF COMPLAINT:  Mediastinal/Hilar Adenopathy   History of Present Illness:  Monique Tyler is a 57 year old woman, never smoker with history of obesity, hypertension and hyperlipidemia who is admitted for sudden onset of chest pain and pressure. D-dimer was elevated and CTA Chest performed which was negative for pulmonary emboli but notable for mediastinal and upper abdominal lymphadenopathy. Cardiology consulted for the chest pain - deemed less likely cardiac in origin as troponins are flat with no ischemic EKG changes.   PCCM consulted for further workup of mediastinal/hilar adenopathy.   She does not report any episodes of chest pain like this previously.  She has sharp substernal chest pain down to the epigastric area through the middle of her back.  She denies any nausea, vomiting or diarrhea.  She denies fevers, chills or night sweats.  Denies any skin rashes or diffuse muscle aches.  She denies any lack of appetite or  unexpected weight loss.  She does have issues with GERD but her current chest discomfort feels different.  She does have some anxiety and denies any history of panic attacks.  Pertinent  Medical History    Significant Hospital Events: Including procedures, antibiotic start and stop dates in addition to other pertinent events     Interim History / Subjective:   Patient reports her chest discomfort has started to ease up this morning. She did not report relief of her chest discomfort with antacid GI cocktail.  She has been on pantoprazole since admission.  She is being treated with ceftriaxone for urinary tract infection.  I reviewed the risks/benefits of bronchoscopy with her in person and with her husband on the phone.  Objective   Blood pressure 111/64, pulse 79, temperature 98.3 F (36.8 C), temperature source Oral,  resp. rate (!) 21, height 5\' 7"  (1.702 m), weight 123.9 kg, last menstrual period 01/18/2012, SpO2 96%.        Intake/Output Summary (Last 24 hours) at 02/25/2023 1140 Last data filed at 02/25/2023 0445 Gross per 24 hour  Intake 623.44 ml  Output 1500 ml  Net -876.56 ml   Filed Weights   02/22/23 0750 02/22/23 1939 02/24/23 0500  Weight: 123 kg 122.9 kg 123.9 kg    Examination: General: Middle-aged woman, no acute distress, sitting up in chair HENT: [place]/AT, sclera anicteric, moist mucous membranes Lungs: Clear to auscultation bilaterally, no wheezing Cardiovascular: Regular rate and rhythm, no murmurs Abdomen: Soft, nontender, nondistended bowel sounds present Extremities: Warm, no edema Neuro: Alert, oriented, moving all extremities GU: N/A   CTA Chest 02/22/23 Mediastinum/Nodes: Visualized thyroid gland appears grossly unremarkable. No solid / cystic mediastinal masses. The esophagus is nondistended precluding optimal assessment.   There are multiple enlarged mediastinal lymph nodes, increased in size since the prior study from 02/20/2018. When measured in similar fashion, the largest in the subcarinal location measuring up to 13 x 22 mm (previously 8 x 12 mm), largest in the superior left hilum measuring up to 16 x 25 mm (previously 30 x 21 mm) and largest in the right hilum measuring up to 16 x 21 mm (previously 11 x 13 mm).   Lungs/Pleura: The central tracheo-bronchial tree is patent. There are dependent changes in bilateral lungs. No mass or consolidation. No pleural effusion or pneumothorax. No suspicious lung nodules.  Resolved Hospital Problem list     Assessment &  Plan:  Mediastinal/Hilar Adenopathy Atypical Chest Pain Hx of GERD  Plan: - Differential includes inflammatory condition such as Sarcoidosis vs possible malignancy -ESR/CRP and ACE are not elevated -CT abdomen/pelvis without notable lymphadenopathy -Plan to proceed with bronchoscopy tomorrow for  lymph node sampling via EBUS -Patient can likely be discharged after procedure  PCCM will continue to follow.  Best Practice (right click and "Reselect all SmartList Selections" daily)   Per primary  Labs   CBC: Recent Labs  Lab 02/22/23 0800 02/23/23 0042  WBC 6.0 5.4  HGB 12.7 12.1  HCT 39.9 41.9  MCV 86.0 94.4  PLT 302 269    Basic Metabolic Panel: Recent Labs  Lab 02/22/23 0800 02/23/23 0042 02/24/23 0500 02/25/23 0437  NA 140 138 138 139  K 3.8 3.5 3.8 3.8  CL 108 110 107 106  CO2 24 20* 25 25  GLUCOSE 116* 104* 113* 106*  BUN 13 12 11 14   CREATININE 0.95 0.92 0.92 1.01*  CALCIUM 9.6 8.9 8.9 9.2   GFR: Estimated Creatinine Clearance: 82.9 mL/min (A) (by C-G formula based on SCr of 1.01 mg/dL (H)). Recent Labs  Lab 02/22/23 0800 02/23/23 0042  WBC 6.0 5.4    Liver Function Tests: Recent Labs  Lab 02/22/23 0800 02/23/23 0042  AST 25 26  ALT 34 35  ALKPHOS 145* 121  BILITOT 0.5 0.7  PROT 7.0 6.5  ALBUMIN 4.2 3.5   Recent Labs  Lab 02/22/23 0800  LIPASE 48   No results for input(s): "AMMONIA" in the last 168 hours.  ABG    Component Value Date/Time   TCO2 25 10/29/2013 2019   O2SAT 78.0 06/21/2015 1005     Coagulation Profile: No results for input(s): "INR", "PROTIME" in the last 168 hours.  Cardiac Enzymes: No results for input(s): "CKTOTAL", "CKMB", "CKMBINDEX", "TROPONINI" in the last 168 hours.  HbA1C: HbA1c, POC (controlled diabetic range)  Date/Time Value Ref Range Status  02/15/2023 09:22 AM 5.5 0.0 - 7.0 % Final   Hgb A1c MFr Bld  Date/Time Value Ref Range Status  02/23/2023 12:42 AM 5.5 4.8 - 5.6 % Final    Comment:    (NOTE) Pre diabetes:          5.7%-6.4%  Diabetes:              >6.4%  Glycemic control for   <7.0% adults with diabetes   08/17/2022 09:25 AM 5.7 (H) 4.8 - 5.6 % Final    Comment:             Prediabetes: 5.7 - 6.4          Diabetes: >6.4          Glycemic control for adults with diabetes:  <7.0     CBG: No results for input(s): "GLUCAP" in the last 168 hours.   Critical care time: n/a    Melody Comas, MD [facility] Office: [phone]   See Amion for personal pager PCCM on call pager [phone] until 7pm. Please call Elink 7p-7a. [phone]

## 2023-02-25 NOTE — Plan of Care (Signed)
  Problem: Education: Goal: Knowledge of General Education information will improve Description: Including pain rating scale, medication(s)/side effects and non-pharmacologic comfort measures Outcome: Progressing   Problem: Health Behavior/Discharge Planning: Goal: Ability to manage health-related needs will improve Outcome: Progressing   Problem: Clinical Measurements: Goal: Respiratory complications will improve Outcome: Progressing Goal: Cardiovascular complication will be avoided Outcome: Progressing   Problem: Activity: Goal: Risk for activity intolerance will decrease Outcome: Progressing   Problem: Nutrition: Goal: Adequate nutrition will be maintained Outcome: Progressing   Problem: Coping: Goal: Level of anxiety will decrease Outcome: Progressing   Problem: Elimination: Goal: Will not experience complications related to urinary retention Outcome: Progressing   Problem: Pain Managment: Goal: General experience of comfort will improve Outcome: Progressing   Problem: Safety: Goal: Ability to remain free from injury will improve Outcome: Progressing   Problem: Skin Integrity: Goal: Risk for impaired skin integrity will decrease Outcome: Progressing   

## 2023-02-26 ENCOUNTER — Encounter (HOSPITAL_COMMUNITY)
Admission: EM | Disposition: A | Discharge status: Home / Self Care | Payer: Self-pay | Source: Home / Self Care | Attending: Internal Medicine

## 2023-02-26 ENCOUNTER — Encounter (HOSPITAL_COMMUNITY): Payer: Self-pay | Admitting: Internal Medicine

## 2023-02-26 ENCOUNTER — Inpatient Hospital Stay (HOSPITAL_COMMUNITY): Payer: Self-pay | Admitting: Registered Nurse

## 2023-02-26 ENCOUNTER — Other Ambulatory Visit (HOSPITAL_BASED_OUTPATIENT_CLINIC_OR_DEPARTMENT_OTHER): Payer: Self-pay

## 2023-02-26 ENCOUNTER — Inpatient Hospital Stay (HOSPITAL_COMMUNITY): Payer: Self-pay

## 2023-02-26 DIAGNOSIS — Z6841 Body Mass Index (BMI) 40.0 and over, adult: Secondary | ICD-10-CM

## 2023-02-26 DIAGNOSIS — R590 Localized enlarged lymph nodes: Secondary | ICD-10-CM

## 2023-02-26 DIAGNOSIS — N390 Urinary tract infection, site not specified: Secondary | ICD-10-CM

## 2023-02-26 DIAGNOSIS — B9689 Other specified bacterial agents as the cause of diseases classified elsewhere: Secondary | ICD-10-CM

## 2023-02-26 DIAGNOSIS — I10 Essential (primary) hypertension: Secondary | ICD-10-CM

## 2023-02-26 HISTORY — PX: BRONCHIAL WASHINGS: SHX5105

## 2023-02-26 HISTORY — PX: ENDOBRONCHIAL ULTRASOUND: SHX5096

## 2023-02-26 HISTORY — PX: BRONCHIAL NEEDLE ASPIRATION BIOPSY: SHX5106

## 2023-02-26 HISTORY — DX: Urinary tract infection, site not specified: N39.0

## 2023-02-26 HISTORY — PX: VIDEO BRONCHOSCOPY: SHX5072

## 2023-02-26 LAB — URINE CULTURE: Culture: >=100000 — AB

## 2023-02-26 LAB — CULTURE, RESPIRATORY W GRAM STAIN

## 2023-02-26 LAB — BODY FLUID CELL COUNT WITH DIFFERENTIAL
Eos, Fluid: 0 %
Lymphs, Fluid: 25 %
Monocyte-Macrophage-Serous Fluid: 30 % — ABNORMAL LOW (ref 50–90)
Neutrophil Count, Fluid: 3 % (ref 0–25)
Other Cells, Fluid: 42 %
Total Nucleated Cell Count, Fluid: 112 cu mm (ref 0–1000)

## 2023-02-26 SURGERY — ENDOBRONCHIAL ULTRASOUND (EBUS)
Anesthesia: General | Laterality: Bilateral

## 2023-02-26 MED ORDER — OXYCODONE HCL 5 MG PO TABS
5.0000 mg | ORAL_TABLET | Freq: Four times a day (QID) | ORAL | 0 refills | Status: AC | PRN
  Filled 2023-02-26: qty 15, 4d supply, fill #0

## 2023-02-26 MED ORDER — DEXAMETHASONE SODIUM PHOSPHATE 10 MG/ML IJ SOLN
INTRAMUSCULAR | Status: DC | PRN
  Administered 2023-02-26: 8 mg via INTRAVENOUS

## 2023-02-26 MED ORDER — CHLORHEXIDINE GLUCONATE 0.12 % MT SOLN
OROMUCOSAL | Status: AC
  Filled 2023-02-26: qty 15

## 2023-02-26 MED ORDER — MAALOX MAX 400-400-40 MG/5ML PO SUSP
10.0000 mL | Freq: Three times a day (TID) | ORAL | 0 refills | Status: DC | PRN
  Filled 2023-02-26: qty 355, 12d supply, fill #0

## 2023-02-26 MED ORDER — ROCURONIUM BROMIDE 100 MG/10ML IV SOLN
INTRAVENOUS | Status: DC | PRN
  Administered 2023-02-26: 50 mg via INTRAVENOUS
  Administered 2023-02-26: 5 mg via INTRAVENOUS

## 2023-02-26 MED ORDER — FENTANYL CITRATE (PF) 100 MCG/2ML IJ SOLN
INTRAMUSCULAR | Status: DC | PRN
  Administered 2023-02-26: 50 ug via INTRAVENOUS

## 2023-02-26 MED ORDER — SUGAMMADEX SODIUM 200 MG/2ML IV SOLN
INTRAVENOUS | Status: DC | PRN
  Administered 2023-02-26: 250 mg via INTRAVENOUS

## 2023-02-26 MED ORDER — PANTOPRAZOLE SODIUM 40 MG PO TBEC
40.0000 mg | DELAYED_RELEASE_TABLET | Freq: Every day | ORAL | 0 refills | Status: DC
  Filled 2023-02-26: qty 30, 30d supply, fill #0

## 2023-02-26 MED ORDER — PROPOFOL 10 MG/ML IV BOLUS
INTRAVENOUS | Status: DC | PRN
Start: 2023-02-26 — End: 2023-02-26
  Administered 2023-02-26: 170 mg via INTRAVENOUS

## 2023-02-26 MED ORDER — CIPROFLOXACIN HCL 500 MG PO TABS
500.0000 mg | ORAL_TABLET | Freq: Two times a day (BID) | ORAL | 0 refills | Status: AC
  Filled 2023-02-26: qty 6, 3d supply, fill #0

## 2023-02-26 MED ORDER — LACTATED RINGERS IV SOLN
INTRAVENOUS | Status: DC | PRN
Start: 2023-02-26 — End: 2023-02-26

## 2023-02-26 MED ORDER — ONDANSETRON HCL 4 MG/2ML IJ SOLN
INTRAMUSCULAR | Status: DC | PRN
Start: 2023-02-26 — End: 2023-02-26
  Administered 2023-02-26: 4 mg via INTRAVENOUS

## 2023-02-26 MED ORDER — CEFAZOLIN SODIUM-DEXTROSE 2-4 GM/100ML-% IV SOLN
2.0000 g | Freq: Three times a day (TID) | INTRAVENOUS | Status: DC
  Filled 2023-02-26: qty 100

## 2023-02-26 MED ORDER — MIDAZOLAM HCL 2 MG/2ML IJ SOLN
INTRAMUSCULAR | Status: DC | PRN
  Administered 2023-02-26: 2 mg via INTRAVENOUS

## 2023-02-26 MED ORDER — MIDAZOLAM HCL 2 MG/2ML IJ SOLN
INTRAMUSCULAR | Status: AC
  Filled 2023-02-26: qty 2

## 2023-02-26 MED ORDER — FENTANYL CITRATE (PF) 100 MCG/2ML IJ SOLN
INTRAMUSCULAR | Status: AC
  Filled 2023-02-26: qty 2

## 2023-02-26 MED ORDER — EPHEDRINE SULFATE (PRESSORS) 50 MG/ML IJ SOLN
INTRAMUSCULAR | Status: DC | PRN
  Administered 2023-02-26: 5 mg via INTRAVENOUS

## 2023-02-26 MED ORDER — CHLORHEXIDINE GLUCONATE 0.12 % MT SOLN
15.0000 mL | Freq: Once | OROMUCOSAL | Status: AC
  Administered 2023-02-26: 15 mL via OROMUCOSAL

## 2023-02-26 MED ORDER — LIDOCAINE HCL (CARDIAC) PF 100 MG/5ML IV SOSY
PREFILLED_SYRINGE | INTRAVENOUS | Status: DC | PRN
  Administered 2023-02-26: 80 mg via INTRAVENOUS

## 2023-02-26 NOTE — Progress Notes (Signed)
Transition of Care [facility]) - Inpatient Brief Assessment   Patient Details  Name: Taiana Rexach MRN: [id number] Date of Birth: [birth year]  Transition of Care [facility]) CM/SW Contact:    Larrie Kass, LCSW Phone Number: 02/26/2023, 4:17 PM   Clinical Narrative: Transition of Care Department [facility]) has reviewed patient and no TOC needs have been identified at this time. We will continue to monitor patient advancement through interdisciplinary progression rounds. If new patient transition needs arise, please place a TOC consult.     Transition of Care Asessment: Insurance and Status: Insurance coverage has been reviewed Patient has primary care physician: Yes Home environment has been reviewed: yes   Prior/Current Home Services: No current home services Social Determinants of Health Reivew: SDOH reviewed no interventions necessary Readmission risk has been reviewed: Yes Transition of care needs: no transition of care needs at this time

## 2023-02-26 NOTE — Progress Notes (Signed)
Progress Note   Patient: Monique Tyler ZOX:[id number] DOB: 1965/09/29 DOA: 02/22/2023     3 DOS: the patient was seen and examined on 02/26/2023   Brief hospital course: Ernesteen Headden is 57 y.o. F w/ hypertension, hyperlipidemia, morbid obesity who prsented with acute chest pressure and pleuritic pain.  In the ER, CTA chest showed increasing mediastinal and hilar lymphadenopathy.   Cardiac work up so far negative seen by cardiology recommended no further work up. Pulmonary recommended Bronch/ EBUS for evaluation. She has renal stone with abnormal urine, started rocephin.   Assessment and Plan: Chest pain likely due to mediastinal and hilar lymphadenopathy Mediastinal and hilar lymphadenopathy Non cardiac chest pain. Evaluated by Cardiology, negative enzymes, echo unremarkable, ACS ruled out.  Pericarditis doubted.   Clinically did not improve with Maalox, GI cocktail. Abnormal finding is the lymphadenopathy, likely causing her pain. Pulmonary team planned for EBUS/bronchoscopy today.    Renal stone Klebsiella UTI CT shows edema around the renal pelvis where the stone is. Discussed with Dr. Margo Aye, who advised outpatient follow up for stone management. Continue Rocephin for UTI, sensitivities reviewed.  Hypertension Continue amlodipine   Hyperlipidemia Continue atorvastatin.   Morbid obesity BMI 42. Diet, exercise and weight reduction advised.   OSA CPAP at night.   Migraines Resumed Topamax   Anxiety Continue Prozac   Neuropathy Continue gabapentin       Subjective: Patient is seen and examined today morning. She is lying in bed, awaiting EBUS procedure. Does have chest pressure, more with deep breathing. No abdominal pain, nausea. Advised out of bed, incentive spiromtry.  Physical Exam: Vitals:   02/26/23 1410 02/26/23 1415 02/26/23 1420 02/26/23 1430  BP: (!) 146/84 (!) 143/77 136/80   Pulse: 74 76 73 72  Resp: 16 14 18    Temp: (!) 97.4 F (36.3 C)     TempSrc:  Temporal     SpO2: 97% 98% 99%   Weight:      Height:       General - Middle aged obese African American female, no apparent distress HEENT - PERRLA, EOMI, atraumatic head, non tender sinuses. Lung - Clear, rales, rhonchi, wheezes. Heart - S1, S2 heard, no murmurs, rubs, no pedal edema Neuro - Alert, awake and oriented, non focal exam. Skin - Warm and dry.  Data Reviewed:     Latest Ref Rng & Units 02/23/2023   12:42 AM 02/22/2023    8:00 AM 02/15/2023   10:05 AM  CBC  WBC 4.0 - 10.5 K/uL 5.4  6.0  4.6   Hemoglobin 12.0 - 15.0 g/dL 81.1  91.4  78.2   Hematocrit 36.0 - 46.0 % 41.9  39.9  40.0   Platelets 150 - 400 K/uL 269  302  306       Latest Ref Rng & Units 02/26/2023    3:52 AM 02/25/2023    4:37 AM 02/24/2023    5:00 AM  BMP  Glucose 70 - 99 mg/dL 956  213  086   BUN 6 - 20 mg/dL 16  14  11    Creatinine 0.44 - 1.00 mg/dL 5.78  4.69  6.29   Sodium 135 - 145 mmol/L 138  139  138   Potassium 3.5 - 5.1 mmol/L 3.7  3.8  3.8   Chloride 98 - 111 mmol/L 105  106  107   CO2 22 - 32 mmol/L 23  25  25    Calcium 8.9 - 10.3 mg/dL 8.9  9.2  8.9  No results found.   Family Communication: Patient and her husband understand and agree with current plan.  Disposition: Status is: Inpatient Remains inpatient appropriate because: pulmonary procedure EBUS/ Bronch for lymphadenopathy evaluation. UTI on antibiotics.  Planned Discharge Destination: Home    Time spent: 43 minutes  Author: Marcelino Duster, MD 02/26/2023 2:35 PM  For on call review [email or web address].

## 2023-02-26 NOTE — Op Note (Signed)
Flexible and EBUS Bronchoscopy Procedure Note  Monique Tyler  [id number]  1965/09/12  Date:02/26/23  Time:2:04 PM   Provider Performing: B    Procedure: Flexible bronchoscopy and EBUS Bronchoscopy  Indication(s) Mediastinal Adenopathy  Consent Risks of the procedure as well as the alternatives and risks of each were explained to the patient and/or caregiver.  Consent for the procedure was obtained.  Anesthesia General Anesthesia   Time Out Verified patient identification, verified procedure, site/side was marked, verified correct patient position, special equipment/implants available, medications/allergies/relevant history reviewed, required imaging and test results available.   Sterile Technique Usual hand hygiene, masks, gowns, and gloves were used   Procedure Description Diagnostic bronchoscope advanced through endotracheal tube and into airway.  Airways were examined down to subsegmental level with findings noted below.  Following diagnostic evaluation the diagnostic bronchoscope was then removed and the EBUS bronchoscope was advanced into airway with stations 7 and 11R biopsied and sent for slide, cell block, and/or culture.  The EBUS bronchoscope was removed after assuring no active bleeding from biopsy site.  Findings:  - Mediastinal and hilar adenopathy noted via EBUS - Normal appearing airways, minimal clear secretions   Complications/Tolerance None; patient tolerated the procedure well. Chest X-ray is needed post procedure.   EBL Minimal   Specimen(s) Station 7 and 11R for slides and cell block BAL RML sent for cell count, cytology and cultures

## 2023-02-26 NOTE — Plan of Care (Signed)

## 2023-02-26 NOTE — Anesthesia Procedure Notes (Signed)
Procedure Name: Intubation Date/Time: 02/26/2023 12:50 PM  Performed by: Karoline Caldwell, CRNAPre-anesthesia Checklist: Patient identified, Patient being monitored, Timeout performed, Emergency Drugs available and Suction available Patient Re-evaluated:Patient Re-evaluated prior to induction Oxygen Delivery Method: Circle system utilized Preoxygenation: Pre-oxygenation with 100% oxygen Induction Type: IV induction Ventilation: Mask ventilation without difficulty Laryngoscope Size: Mac and 3 Grade View: Grade I Tube type: Oral Tube size: 8.5 mm Number of attempts: 1 Airway Equipment and Method: Stylet Placement Confirmation: ETT inserted through vocal cords under direct vision, positive ETCO2 and breath sounds checked- equal and bilateral Secured at: 23 cm Tube secured with: Tape Dental Injury: Teeth and Oropharynx as per pre-operative assessment

## 2023-02-26 NOTE — Progress Notes (Signed)
NAME:  Monique Tyler, MRN:  [id number], DOB:  [birth year], LOS: 3 ADMISSION DATE:  02/22/2023, CONSULTATION DATE:  02/23/23 REFERRING MD:  Lanae Boast, MD CHIEF COMPLAINT:  Mediastinal/Hilar Adenopathy   History of Present Illness:  Monique Tyler is a 57 year old woman, never smoker with history of obesity, hypertension and hyperlipidemia who is admitted for sudden onset of chest pain and pressure. D-dimer was elevated and CTA Chest performed which was negative for pulmonary emboli but notable for mediastinal and upper abdominal lymphadenopathy. Cardiology consulted for the chest pain - deemed less likely cardiac in origin as troponins are flat with no ischemic EKG changes.   PCCM consulted for further workup of mediastinal/hilar adenopathy.   She does not report any episodes of chest pain like this previously.  She has sharp substernal chest pain down to the epigastric area through the middle of her back.  She denies any nausea, vomiting or diarrhea.  She denies fevers, chills or night sweats.  Denies any skin rashes or diffuse muscle aches.  She denies any lack of appetite or  unexpected weight loss.  She does have issues with GERD but her current chest discomfort feels different.  She does have some anxiety and denies any history of panic attacks.  Pertinent  Medical History    Significant Hospital Events: Including procedures, antibiotic start and stop dates in addition to other pertinent events     Interim History / Subjective:   Plan for bronchoscopy today  Objective   Blood pressure 128/62, pulse 61, temperature 98.2 F (36.8 C), temperature source Oral, resp. rate 16, height 5\' 7"  (1.702 m), weight 123 kg, last menstrual period 01/18/2012, SpO2 100%.        Intake/Output Summary (Last 24 hours) at 02/26/2023 0941 Last data filed at 02/26/2023 0305 Gross per 24 hour  Intake 220 ml  Output 900 ml  Net -680 ml   Filed Weights   02/22/23 1939 02/24/23 0500 02/26/23 0500  Weight: 122.9  kg 123.9 kg 123 kg    Examination: General: Middle-aged woman, no acute distress, sitting up in chair HENT: [place]/AT, sclera anicteric, moist mucous membranes Lungs: Clear to auscultation bilaterally, no wheezing Cardiovascular: Regular rate and rhythm, no murmurs Abdomen: Soft, nontender, nondistended bowel sounds present Extremities: Warm, no edema Neuro: Alert, oriented, moving all extremities GU: N/A   CTA Chest 02/22/23 Mediastinum/Nodes: Visualized thyroid gland appears grossly unremarkable. No solid / cystic mediastinal masses. The esophagus is nondistended precluding optimal assessment.   There are multiple enlarged mediastinal lymph nodes, increased in size since the prior study from 02/20/2018. When measured in similar fashion, the largest in the subcarinal location measuring up to 13 x 22 mm (previously 8 x 12 mm), largest in the superior left hilum measuring up to 16 x 25 mm (previously 30 x 21 mm) and largest in the right hilum measuring up to 16 x 21 mm (previously 11 x 13 mm).   Lungs/Pleura: The central tracheo-bronchial tree is patent. There are dependent changes in bilateral lungs. No mass or consolidation. No pleural effusion or pneumothorax. No suspicious lung nodules.  Resolved Hospital Problem list     Assessment & Plan:  Mediastinal/Hilar Adenopathy Atypical Chest Pain Hx of GERD  Plan: - Differential includes inflammatory condition such as Sarcoidosis vs possible malignancy -ESR/CRP and ACE are not elevated -CT abdomen/pelvis without notable lymphadenopathy -Plan to proceed with bronchoscopy today for lymph node sampling via EBUS -Patient can likely be discharged after procedure  Best Practice (right click  and "Reselect all SmartList Selections" daily)   Per primary  Labs   CBC: Recent Labs  Lab 02/22/23 0800 02/23/23 0042  WBC 6.0 5.4  HGB 12.7 12.1  HCT 39.9 41.9  MCV 86.0 94.4  PLT 302 269    Basic Metabolic Panel: Recent Labs  Lab  02/22/23 0800 02/23/23 0042 02/24/23 0500 02/25/23 0437 02/26/23 0352  NA 140 138 138 139 138  K 3.8 3.5 3.8 3.8 3.7  CL 108 110 107 106 105  CO2 24 20* 25 25 23   GLUCOSE 116* 104* 113* 106* 110*  BUN 13 12 11 14 16   CREATININE 0.95 0.92 0.92 1.01* 1.01*  CALCIUM 9.6 8.9 8.9 9.2 8.9   GFR: Estimated Creatinine Clearance: 82.6 mL/min (A) (by C-G formula based on SCr of 1.01 mg/dL (H)). Recent Labs  Lab 02/22/23 0800 02/23/23 0042  WBC 6.0 5.4    Liver Function Tests: Recent Labs  Lab 02/22/23 0800 02/23/23 0042  AST 25 26  ALT 34 35  ALKPHOS 145* 121  BILITOT 0.5 0.7  PROT 7.0 6.5  ALBUMIN 4.2 3.5   Recent Labs  Lab 02/22/23 0800  LIPASE 48   No results for input(s): "AMMONIA" in the last 168 hours.  ABG    Component Value Date/Time   TCO2 25 10/29/2013 2019   O2SAT 78.0 06/21/2015 1005     Coagulation Profile: No results for input(s): "INR", "PROTIME" in the last 168 hours.  Cardiac Enzymes: No results for input(s): "CKTOTAL", "CKMB", "CKMBINDEX", "TROPONINI" in the last 168 hours.  HbA1C: HbA1c, POC (controlled diabetic range)  Date/Time Value Ref Range Status  02/15/2023 09:22 AM 5.5 0.0 - 7.0 % Final   Hgb A1c MFr Bld  Date/Time Value Ref Range Status  02/23/2023 12:42 AM 5.5 4.8 - 5.6 % Final    Comment:    (NOTE) Pre diabetes:          5.7%-6.4%  Diabetes:              >6.4%  Glycemic control for   <7.0% adults with diabetes   08/17/2022 09:25 AM 5.7 (H) 4.8 - 5.6 % Final    Comment:             Prediabetes: 5.7 - 6.4          Diabetes: >6.4          Glycemic control for adults with diabetes: <7.0     CBG: No results for input(s): "GLUCAP" in the last 168 hours.   Critical care time: n/a    Melody Comas, MD [facility] Office: [phone]   See Amion for personal pager PCCM on call pager [phone] until 7pm. Please call Elink 7p-7a. [phone]

## 2023-02-26 NOTE — Discharge Summary (Addendum)
Physician Discharge Summary   Patient: Monique Tyler MRN: [id number] DOB: 04-Aug-1965  Admit date:     02/22/2023  Discharge date: 02/26/23  Discharge Physician: Marcelino Duster   PCP: Hoy Register, MD   Recommendations at discharge:    PCP follow up in 1 week. Pulmonary follow up as suggested.  Discharge Diagnoses: Principal Problem:   Noncardiac chest pain Active Problems:   Essential hypertension   Mediastinal adenopathy   Obesity, Class III, BMI 40-49.9 (morbid obesity) (HCC)   Hyperlipidemia   GAD (generalized anxiety disorder)   Chronic pain syndrome   Non-cardiac chest pain   UTI due to Klebsiella species  Resolved Problems:   * No resolved hospital problems. *  Hospital Course: Monique Tyler is 57 y.o. F w/ hypertension, hyperlipidemia, morbid obesity who prsented with acute chest pressure and pleuritic pain. In the ER, CTA chest showed increasing mediastinal and hilar lymphadenopathy. Cardiac work up so far negative seen by cardiology recommended no further work up. Pulmonary recommended Bronch/ EBUS for evaluation. She has renal stone with abnormal urine, urology advised no acute intervention, started rocephin. Urine cultures came back positive for Klebsiella. She had Bronchoscopy, EBUS today, biopsies taken. She did tolerate the procedure well. Patient is hemodynamically stable to be discharged home. Advised to follow up with PCP, pulmonary as suggested, new scripts sent to pharmacy. Patient and husband understand and agree with discharge plan.   Assessment and Plan: Chest pain likely due to mediastinal and hilar lymphadenopathy Mediastinal and hilar lymphadenopathy Non cardiac chest pain. Evaluated by Cardiology, negative enzymes, echo unremarkable, ACS ruled out.  Pericarditis unlikely. Clinically did not improve with Maalox, GI cocktail. Abnormal finding is the lymphadenopathy, likely causing her pain. Pulmonary team performed for EBUS/bronchoscopy, biopsies  taken, advised follow up outpatient for results and management   Renal stone Klebsiella UTI CT shows edema around the renal pelvis where the stone is.  Urinalysis abnormal.Discussed with urology team no acute intervention as she has no flank pain. Rocephin transitioned to Cipro therapy for 3 days.   Hypertension Continue amlodipine   Hyperlipidemia Continue atorvastatin, low-dose aspirin   Morbid obesity BMI 42. Diet, exercise and weight reduction advised.   OSA CPAP at night.   Migraines Resumed Topamax   Anxiety Continue Prozac   Neuropathy Continue gabapentin.       Consultants: Cardiology, Pulmonary Procedures performed: Bronchoscopy, EBUS  Disposition: Home Diet recommendation:  Discharge Diet Orders (From admission, onward)     Start     Ordered   02/26/23 0000  Diet - low sodium heart healthy        02/26/23 1628           Cardiac diet DISCHARGE MEDICATION: Allergies as of 02/26/2023       Reactions   Shellfish Allergy Swelling   Pork-derived Products Nausea And Vomiting        Medication List     TAKE these medications    acetaminophen 500 MG tablet Commonly known as: TYLENOL Take 500 mg by mouth every 6 (six) hours as needed.   albuterol 108 (90 Base) MCG/ACT inhaler Commonly known as: VENTOLIN HFA Inhale 2 puffs into the lungs every 4 (four) hours as needed for wheezing or shortness of breath.   amLODipine 5 MG tablet Commonly known as: NORVASC Take 1 tablet (5 mg total) by mouth daily.   atorvastatin 40 MG tablet Commonly known as: LIPITOR Take 1 tablet (40 mg total) by mouth daily.   cetirizine 10 MG tablet Commonly known  as: ZYRTEC Take 1 tablet (10 mg total) by mouth daily.   ciprofloxacin 500 MG tablet Commonly known as: Cipro Take 1 tablet (500 mg total) by mouth 2 (two) times daily for 3 days.   FLUoxetine 40 MG capsule Commonly known as: PROZAC Take 1 capsule (40 mg total) by mouth daily.   fluticasone 50  MCG/ACT nasal spray Commonly known as: FLONASE Place 2 sprays into both nostrils daily.   gabapentin 300 MG capsule Commonly known as: NEURONTIN Take 1 capsule (300 mg total) by mouth at bedtime.   hydrOXYzine 25 MG tablet Commonly known as: ATARAX Take 1 tablet (25 mg total) by mouth 3 (three) times daily as needed (Increase night time dose 50 mg).   Maalox Max 400-400-40 MG/5ML suspension Generic drug: alum & mag hydroxide-simeth Take 10 mLs by mouth 3 (three) times daily as needed for indigestion.   meloxicam 7.5 MG tablet Commonly known as: MOBIC Take 1 tablet (7.5 mg total) by mouth daily.   oxyCODONE 5 MG immediate release tablet Commonly known as: Roxicodone Take 1 tablet (5 mg total) by mouth every 6 (six) hours as needed for up to 5 days for severe pain.   pantoprazole 40 MG tablet Commonly known as: PROTONIX Take 1 tablet (40 mg total) by mouth daily. Start taking on: February 27, 2023   topiramate 100 MG tablet Commonly known as: TOPAMAX Take 1 tablet (100 mg total) by mouth at bedtime.   vitamin C 1000 MG tablet Take 1 tablet (1,000 mg total) by mouth daily.   Vitamin D 50 MCG (2000 UT) Caps Take 1 capsule (2,000 Units total) by mouth daily.        Discharge Exam: Filed Weights   02/22/23 1939 02/24/23 0500 02/26/23 0500  Weight: 122.9 kg 123.9 kg 123 kg   General - Middle aged obese African American female, no apparent distress HEENT - PERRLA, EOMI, atraumatic head, non tender sinuses. Lung - Clear, rales, rhonchi, wheezes. Heart - S1, S2 heard, no murmurs, rubs, no pedal edema Neuro - Alert, awake and oriented, non focal exam. Skin - Warm and dry.  Condition at discharge: stable  The results of significant diagnostics from this hospitalization (including imaging, microbiology, ancillary and laboratory) are listed below for reference.   Imaging Studies: DG CHEST PORT 1 VIEW  Result Date: 02/26/2023 CLINICAL DATA:  Status post bronchoscopy EXAM:  PORTABLE CHEST 1 VIEW COMPARISON:  Chest radiograph and chest CT 02/22/2023 FINDINGS: The cardiomediastinal silhouette is stable Lung volumes are low. There is no focal consolidation or pulmonary edema. There is no pleural effusion. There is no appreciable pneumothorax There is no acute osseous abnormality. IMPRESSION: No evidence of pneumothorax or other acute pathology following bronchoscopy. Electronically Signed   By: Lesia Hausen M.D.   On: 02/26/2023 14:52   CT ABDOMEN PELVIS W CONTRAST  Result Date: 02/23/2023 CLINICAL DATA:  Adenopathy. EXAM: CT ABDOMEN AND PELVIS WITH CONTRAST TECHNIQUE: Multidetector CT imaging of the abdomen and pelvis was performed using the standard protocol following bolus administration of intravenous contrast. RADIATION DOSE REDUCTION: This exam was performed according to the departmental dose-optimization program which includes automated exposure control, adjustment of the mA and/or kV according to patient size and/or use of iterative reconstruction technique. CONTRAST:  OMNIPAQUE IOHEXOL 300 MG/ML  SOLN COMPARISON:  Chest CT 02/22/2023 FINDINGS: Lower chest: Mediastinal and hilar lymphadenopathy evident, better characterized on chest CTA yesterday. Hepatobiliary: No suspicious focal abnormality within the liver parenchyma. There is no evidence for gallstones, gallbladder  wall thickening, or pericholecystic fluid. No intrahepatic or extrahepatic biliary dilation. Pancreas: No focal mass lesion. No dilatation of the main duct. No intraparenchymal cyst. No peripancreatic edema. Spleen: No splenomegaly. No suspicious focal mass lesion. Adrenals/Urinary Tract: No adrenal nodule or mass. Punctate nonobstructing stone seen upper pole right kidney. 3 tiny stones are seen in the left kidney. 6 x 3 x 6 mm stone is identified in the left renal pelvis. There is some mild peripelvic edema without overt left hydronephrosis. No evidence for hydroureter. The urinary bladder appears normal  for the degree of distention. Stomach/Bowel: Stomach is unremarkable. No gastric wall thickening. No evidence of outlet obstruction. Duodenum is normally positioned as is the ligament of Treitz. No small bowel wall thickening. No small bowel dilatation. The terminal ileum is normal. The appendix is normal. No gross colonic mass. No colonic wall thickening. Vascular/Lymphatic: No abdominal aortic aneurysm. No abdominal aortic atherosclerotic calcification. 8 mm short axis gastrohepatic ligament lymph node evident. 10 mm short axis portal caval node identified on 33/2. No mesenteric lymphadenopathy. No retroperitoneal lymphadenopathy. No pelvic sidewall lymphadenopathy. Reproductive: Unremarkable. Other: No intraperitoneal free fluid. Musculoskeletal: No worrisome lytic or sclerotic osseous abnormality. IMPRESSION: 1. 6 x 3 x 6 mm stone in the left renal pelvis with some mild peripelvic edema but no overt left hydronephrosis. 2. Additional tiny nonobstructing stones in both kidneys. 3. Mediastinal and hilar lymphadenopathy, better characterized on chest CTA performed yesterday. 4. Upper normal gastrohepatic ligament and portal caval lymph nodes. No overt lymphadenopathy in the abdomen or pelvis. Electronically Signed   By: Kennith Center M.D.   On: 02/23/2023 19:20   ECHOCARDIOGRAM COMPLETE  Result Date: 02/23/2023    ECHOCARDIOGRAM REPORT   Patient Name:   KELISHA TURNQUIST Date of Exam: 02/23/2023 Medical Rec #:  [id number]     Height:       67.0 in Accession #:    [id number]    Weight:       270.9 lb Date of Birth:  1966/03/08     BSA:          2.301 m Patient Age:    56 years      BP:           123/60 mmHg Patient Gender: F             HR:           56 bpm. Exam Location:  Inpatient Procedure: 2D Echo, Cardiac Doppler and Color Doppler Indications:    Chest pain R07.9  History:        Patient has prior history of Echocardiogram examinations, most                 recent 05/02/2018. Signs/Symptoms:Chest Pain; Risk  Factors:Sleep                 Apnea, Former Smoker, Hypertension and Dyslipidemia.  Sonographer:    Dondra Prader RVT RCS Referring Phys: [id number] SUBRINA SUNDIL IMPRESSIONS  1. Left ventricular ejection fraction, by estimation, is 65 to 70%. The left ventricle has hyperdynamic function. The left ventricle has no regional wall motion abnormalities. Left ventricular diastolic parameters were normal.  2. Right ventricular systolic function is normal. The right ventricular size is normal. Tricuspid regurgitation signal is inadequate for assessing PA pressure.  3. The mitral valve is normal in structure. Trivial mitral valve regurgitation. No evidence of mitral stenosis.  4. The aortic valve is tricuspid. Aortic valve regurgitation is not visualized. No aortic stenosis is  present.  5. The inferior vena cava is normal in size with <50% respiratory variability, suggesting right atrial pressure of 8 mmHg. FINDINGS  Left Ventricle: Left ventricular ejection fraction, by estimation, is 65 to 70%. The left ventricle has hyperdynamic function. The left ventricle has no regional wall motion abnormalities. The left ventricular internal cavity size was normal in size. There is no left ventricular hypertrophy. Left ventricular diastolic parameters were normal. Right Ventricle: The right ventricular size is normal. No increase in right ventricular wall thickness. Right ventricular systolic function is normal. Tricuspid regurgitation signal is inadequate for assessing PA pressure. Left Atrium: Left atrial size was normal in size. Right Atrium: Right atrial size was normal in size. Pericardium: There is no evidence of pericardial effusion. Mitral Valve: The mitral valve is normal in structure. Trivial mitral valve regurgitation. No evidence of mitral valve stenosis. Tricuspid Valve: The tricuspid valve is normal in structure. Tricuspid valve regurgitation is not demonstrated. Aortic Valve: The aortic valve is tricuspid. Aortic valve  regurgitation is not visualized. No aortic stenosis is present. Aortic valve mean gradient measures 4.0 mmHg. Aortic valve peak gradient measures 6.5 mmHg. Aortic valve area, by VTI measures 2.62 cm. Pulmonic Valve: The pulmonic valve was normal in structure. Pulmonic valve regurgitation is not visualized. Aorta: The aortic root is normal in size and structure. Venous: The inferior vena cava is normal in size with less than 50% respiratory variability, suggesting right atrial pressure of 8 mmHg. IAS/Shunts: No atrial level shunt detected by color flow Doppler.  LEFT VENTRICLE PLAX 2D LVIDd:         4.60 cm   Diastology LVIDs:         1.90 cm   LV e' medial:    6.96 cm/s LV PW:         1.10 cm   LV E/e' medial:  12.5 LV IVS:        1.00 cm   LV e' lateral:   8.81 cm/s LVOT diam:     2.00 cm   LV E/e' lateral: 9.9 LV SV:         76 LV SV Index:   33 LVOT Area:     3.14 cm  RIGHT VENTRICLE             IVC RV S prime:     13.40 cm/s  IVC diam: 1.90 cm TAPSE (M-mode): 2.4 cm LEFT ATRIUM             Index        RIGHT ATRIUM           Index LA diam:        3.40 cm 1.48 cm/m   RA Area:     12.90 cm LA Vol (A2C):   38.2 ml 16.60 ml/m  RA Volume:   33.80 ml  14.69 ml/m LA Vol (A4C):   32.1 ml 13.95 ml/m LA Biplane Vol: 36.0 ml 15.65 ml/m  AORTIC VALVE AV Area (Vmax):    2.72 cm AV Area (Vmean):   2.57 cm AV Area (VTI):     2.62 cm AV Vmax:           127.00 cm/s AV Vmean:          88.500 cm/s AV VTI:            0.289 m AV Peak Grad:      6.5 mmHg AV Mean Grad:      4.0 mmHg LVOT Vmax:  110.00 cm/s LVOT Vmean:        72.500 cm/s LVOT VTI:          0.241 m LVOT/AV VTI ratio: 0.83  AORTA Ao Root diam: 3.20 cm Ao Asc diam:  2.70 cm MITRAL VALVE MV Area (PHT): 3.53 cm    SHUNTS MV Decel Time: 215 msec    Systemic VTI:  0.24 m MV E velocity: 87.00 cm/s  Systemic Diam: 2.00 cm MV A velocity: 56.30 cm/s MV E/A ratio:  1.55 Dalton McleanMD Electronically signed by Wilfred Lacy Signature Date/Time:  02/23/2023/4:29:08 PM    Final    CT Angio Chest PE W/Cm &/Or Wo Cm  Result Date: 02/22/2023 CLINICAL DATA:  Pulmonary embolism (PE) suspected, low to intermediate prob, positive D-dimer EXAM: CT ANGIOGRAPHY CHEST WITH CONTRAST TECHNIQUE: Multidetector CT imaging of the chest was performed using the standard protocol during bolus administration of intravenous contrast. Multiplanar CT image reconstructions and MIPs were obtained to evaluate the vascular anatomy. RADIATION DOSE REDUCTION: This exam was performed according to the departmental dose-optimization program which includes automated exposure control, adjustment of the mA and/or kV according to patient size and/or use of iterative reconstruction technique. CONTRAST:  OMNIPAQUE IOHEXOL 350 MG/ML SOLN COMPARISON:  CT angiography chest from 02/20/2018. FINDINGS: Cardiovascular: No evidence of embolism to the proximal subsegmental pulmonary artery level. Normal cardiac size. No pericardial effusion. No aortic aneurysm. Mediastinum/Nodes: Visualized thyroid gland appears grossly unremarkable. No solid / cystic mediastinal masses. The esophagus is nondistended precluding optimal assessment. There are multiple enlarged mediastinal lymph nodes, increased in size since the prior study from 02/20/2018. When measured in similar fashion, the largest in the subcarinal location measuring up to 13 x 22 mm (previously 8 x 12 mm), largest in the superior left hilum measuring up to 16 x 25 mm (previously 30 x 21 mm) and largest in the right hilum measuring up to 16 x 21 mm (previously 11 x 13 mm). Lungs/Pleura: The central tracheo-bronchial tree is patent. There are dependent changes in bilateral lungs. No mass or consolidation. No pleural effusion or pneumothorax. No suspicious lung nodules. Upper Abdomen: Visualized upper abdominal viscera within normal limits. Musculoskeletal: There are multiple mildly enlarged gastrohepatic, porta hepatis, portacaval and  peripancreatic lymph nodes. The visualized soft tissues of the chest wall are grossly unremarkable. No suspicious osseous lesions. There are mild multilevel degenerative changes in the visualized spine. Review of the MIP images confirms the above findings. IMPRESSION: 1. No evidence of pulmonary embolism. 2. Interval increase in size of mediastinal and hilar lymphadenopathy as described above. Multiple mildly enlarged upper abdominal lymph nodes as well. Findings are nonspecific and differential diagnosis includes sarcoidosis, malignancy, etc. Further evaluation with PET-CT scan and tissue sampling is recommended. Electronically Signed   By: Jules Schick M.D.   On: 02/22/2023 10:30   DG Chest Port 1 View  Result Date: 02/22/2023 CLINICAL DATA:  cp EXAM: PORTABLE CHEST 1 VIEW COMPARISON:  CXR 08/07/22 FINDINGS: No pleural effusion. No pneumothorax. Normal cardiac and mediastinal contours. No radiographically apparent displaced rib fractures. Visualized upper abdomen is unremarkable. IMPRESSION: No focal airspace opacity Electronically Signed   By: Lorenza Cambridge M.D.   On: 02/22/2023 08:37    Microbiology: Results for orders placed or performed during the hospital encounter of 02/22/23  Urine Culture (for pregnant, neutropenic or urologic patients or patients with an indwelling urinary catheter)     Status: Abnormal   Collection Time: 02/24/23  9:39 PM   Specimen: Urine, Clean Catch  Result Value Ref Range Status   Specimen Description   Final    URINE, CLEAN CATCH Performed at [facility], [address], [place], [place] [address]    Special Requests   Final    NONE Performed at [facility], [address], [place], [place] [address]    Culture >=100,000 COLONIES/mL KLEBSIELLA PNEUMONIAE (A)  Final   Report Status 02/26/2023 FINAL  Final   Organism ID, Bacteria KLEBSIELLA PNEUMONIAE (A)  Final      Susceptibility   Klebsiella pneumoniae - MIC*     AMPICILLIN RESISTANT Resistant     CEFAZOLIN <=4 SENSITIVE Sensitive     CEFEPIME <=0.12 SENSITIVE Sensitive     CEFTRIAXONE <=0.25 SENSITIVE Sensitive     CIPROFLOXACIN <=0.25 SENSITIVE Sensitive     GENTAMICIN <=1 SENSITIVE Sensitive     IMIPENEM <=0.25 SENSITIVE Sensitive     NITROFURANTOIN 64 INTERMEDIATE Intermediate     TRIMETH/SULFA <=20 SENSITIVE Sensitive     AMPICILLIN/SULBACTAM 4 SENSITIVE Sensitive     PIP/TAZO <=4 SENSITIVE Sensitive     * >=100,000 COLONIES/mL KLEBSIELLA PNEUMONIAE    Labs: CBC: Recent Labs  Lab 02/22/23 0800 02/23/23 0042  WBC 6.0 5.4  HGB 12.7 12.1  HCT 39.9 41.9  MCV 86.0 94.4  PLT 302 269   Basic Metabolic Panel: Recent Labs  Lab 02/22/23 0800 02/23/23 0042 02/24/23 0500 02/25/23 0437 02/26/23 0352  NA 140 138 138 139 138  K 3.8 3.5 3.8 3.8 3.7  CL 108 110 107 106 105  CO2 24 20* 25 25 23   GLUCOSE 116* 104* 113* 106* 110*  BUN 13 12 11 14 16   CREATININE 0.95 0.92 0.92 1.01* 1.01*  CALCIUM 9.6 8.9 8.9 9.2 8.9   Liver Function Tests: Recent Labs  Lab 02/22/23 0800 02/23/23 0042  AST 25 26  ALT 34 35  ALKPHOS 145* 121  BILITOT 0.5 0.7  PROT 7.0 6.5  ALBUMIN 4.2 3.5   CBG: No results for input(s): "GLUCAP" in the last 168 hours.  Discharge time spent: greater than 30 minutes.  Signed: Marcelino Duster, MD [facility] 02/26/2023

## 2023-02-26 NOTE — Transfer of Care (Signed)
Immediate Anesthesia Transfer of Care Note  Patient: Monique Tyler  Procedure(s) Performed: ENDOBRONCHIAL ULTRASOUND (Bilateral) VIDEO BRONCHOSCOPY WITHOUT FLUORO BRONCHIAL NEEDLE ASPIRATION BIOPSIES BRONCHIAL WASHINGS  Patient Location: PACU  Anesthesia Type:General  Level of Consciousness: awake, alert , and oriented  Airway & Oxygen Therapy: Patient Spontanous Breathing  Post-op Assessment: Report given to RN and Post -op Vital signs reviewed and stable  Post vital signs: Reviewed and stable  Last Vitals:  Vitals Value Taken Time  BP 146/84 02/26/23 1410  Temp 36.3 C 02/26/23 1410  Pulse 74 02/26/23 1410  Resp 16 02/26/23 1410  SpO2 97 % 02/26/23 1410    Last Pain:  Vitals:   02/26/23 1410  TempSrc: Temporal  PainSc: 0-No pain      Patients Stated Pain Goal: 0 (02/26/23 0538)  Complications: No notable events documented.

## 2023-02-26 NOTE — Anesthesia Preprocedure Evaluation (Addendum)
Anesthesia Evaluation  Patient identified by MRN, date of birth, ID band Patient awake    Reviewed: Allergy & Precautions, NPO status , Patient's Chart, lab work & pertinent test results  Airway Mallampati: III  TM Distance: >3 FB Neck ROM: Full    Dental  (+) Dental Advisory Given, Upper Dentures, Lower Dentures   Pulmonary asthma , sleep apnea , former smoker Lung nodules Mediastinal adenopathy    Pulmonary exam normal breath sounds clear to auscultation       Cardiovascular hypertension, Pt. on medications Normal cardiovascular exam Rhythm:Regular Rate:Normal     Neuro/Psych  Headaches PSYCHIATRIC DISORDERS Anxiety        GI/Hepatic Neg liver ROS,GERD  ,,  Endo/Other    Morbid obesity  Renal/GU negative Renal ROS     Musculoskeletal negative musculoskeletal ROS (+)    Abdominal   Peds  Hematology negative hematology ROS (+)   Anesthesia Other Findings Day of surgery medications reviewed with the patient.  Reproductive/Obstetrics                             Anesthesia Physical Anesthesia Plan  ASA: 3  Anesthesia Plan: General   Post-op Pain Management: Minimal or no pain anticipated   Induction: Intravenous  PONV Risk Score and Plan: 3 and Dexamethasone and Ondansetron  Airway Management Planned: Oral ETT  Additional Equipment:   Intra-op Plan:   Post-operative Plan: Extubation in OR  Informed Consent: I have reviewed the patients History and Physical, chart, labs and discussed the procedure including the risks, benefits and alternatives for the proposed anesthesia with the patient or authorized representative who has indicated his/her understanding and acceptance.     Dental advisory given  Plan Discussed with: CRNA  Anesthesia Plan Comments:         Anesthesia Quick Evaluation

## 2023-02-27 NOTE — Anesthesia Postprocedure Evaluation (Signed)
Anesthesia Post Note  Patient: Monique Tyler  Procedure(s) Performed: ENDOBRONCHIAL ULTRASOUND (Bilateral) VIDEO BRONCHOSCOPY WITHOUT FLUORO BRONCHIAL NEEDLE ASPIRATION BIOPSIES BRONCHIAL WASHINGS     Patient location during evaluation: Endoscopy Anesthesia Type: General Level of consciousness: awake Pain management: pain level controlled Vital Signs Assessment: post-procedure vital signs reviewed and stable Respiratory status: spontaneous breathing, nonlabored ventilation and respiratory function stable Cardiovascular status: blood pressure returned to baseline and stable Postop Assessment: no apparent nausea or vomiting Anesthetic complications: no   No notable events documented.  Last Vitals:  Vitals:   02/26/23 1430 02/26/23 1440  BP: (!) 146/75 120/69  Pulse: 72 71  Resp: 16 19  Temp:    SpO2: 95% 97%    Last Pain:  Vitals:   02/26/23 1655  TempSrc:   PainSc: 1                   P 

## 2023-02-28 ENCOUNTER — Other Ambulatory Visit (HOSPITAL_COMMUNITY): Payer: Self-pay

## 2023-02-28 ENCOUNTER — Other Ambulatory Visit: Payer: Self-pay

## 2023-02-28 ENCOUNTER — Telehealth: Payer: Self-pay

## 2023-02-28 NOTE — Transitions of Care (Post Inpatient/ED Visit) (Signed)
   02/28/2023  Name: Monique Tyler MRN: [id number] DOB: 1965-09-02  Today's TOC FU Call Status: Today's TOC FU Call Status:: Successful TOC FU Call Completed TOC FU Call Complete Date: 02/28/23  Transition Care Management Follow-up Telephone Call Date of Discharge: 02/26/23 Discharge Facility: [facility]) Type of Discharge: Inpatient Admission Primary Inpatient Discharge Diagnosis:: non cardiac chest pain How have you been since you were released from the hospital?: Better Any questions or concerns?: No  Items Reviewed: Did you receive and understand the discharge instructions provided?: Yes Medications obtained,verified, and reconciled?: No Medications Not Reviewed Reasons:: Other: (she said she has all of her medications and did not have any questions about the med regime and did not need to review the med list.) Any new allergies since your discharge?: No Dietary orders reviewed?: Yes Type of Diet Ordered:: heart healthy Do you have support at home?: Yes People in Home: spouse Name of Support/Comfort Primary Source: her husband  Medications Reviewed Today: Medications Reviewed Today   Medications were not reviewed in this encounter     Home Care and Equipment/Supplies: Were Home Health Services Ordered?: No Any new equipment or medical supplies ordered?: No  Functional Questionnaire: Do you need assistance with bathing/showering or dressing?: No Do you need assistance with meal preparation?: No Do you need assistance with eating?: No Do you have difficulty maintaining continence: No Do you need assistance with getting out of bed/getting out of a chair/moving?: No Do you have difficulty managing or taking your medications?: No  Follow up appointments reviewed: PCP Follow-up appointment confirmed?: Yes Date of PCP follow-up appointment?: 03/07/23 Follow-up Provider: [facility] Follow-up appointment confirmed?: Yes Date of Specialist follow-up  appointment?: 03/14/23 Follow-Up Specialty Provider:: cardiology Do you need transportation to your follow-up appointment?: No Do you understand care options if your condition(s) worsen?: Yes-patient verbalized understanding    SIGNATURE Robyne Peers, RN

## 2023-03-01 ENCOUNTER — Encounter (HOSPITAL_COMMUNITY): Payer: Self-pay | Admitting: Pulmonary Disease

## 2023-03-01 ENCOUNTER — Other Ambulatory Visit: Payer: Self-pay

## 2023-03-07 ENCOUNTER — Encounter: Payer: Self-pay | Admitting: Family Medicine

## 2023-03-07 ENCOUNTER — Ambulatory Visit: Payer: Self-pay | Attending: Family Medicine | Admitting: Family Medicine

## 2023-03-07 VITALS — BP 119/82 | HR 70 | Ht 67.0 in | Wt 272.4 lb

## 2023-03-07 DIAGNOSIS — M722 Plantar fascial fibromatosis: Secondary | ICD-10-CM

## 2023-03-07 DIAGNOSIS — F419 Anxiety disorder, unspecified: Secondary | ICD-10-CM

## 2023-03-07 DIAGNOSIS — R0789 Other chest pain: Secondary | ICD-10-CM

## 2023-03-07 DIAGNOSIS — R590 Localized enlarged lymph nodes: Secondary | ICD-10-CM

## 2023-03-07 NOTE — Patient Instructions (Signed)
Plantar Fasciitis Rehab Ask your health care provider which exercises are safe for you. Do exercises exactly as told by your health care provider and adjust them as directed. It is normal to feel mild stretching, pulling, tightness, or discomfort as you do these exercises. Stop right away if you feel sudden pain or your pain gets worse. Do not begin these exercises until told by your health care provider. Stretching and range-of-motion exercises These exercises warm up your muscles and joints and improve the movement and flexibility of your foot. These exercises also help to relieve pain. Plantar fascia stretch  Sit with your left / right leg crossed over your opposite knee. Hold your heel with one hand with that thumb near your arch. With your other hand, hold your toes and gently pull them back toward the top of your foot. You should feel a stretch on the base (bottom) of your toes, or the bottom of your foot (plantar fascia), or both. Hold this stretch for__________ seconds. Slowly release your toes and return to the starting position. Repeat __________ times. Complete this exercise __________ times a day. Gastrocnemius stretch, standing This exercise is also called a calf (gastroc) stretch. It stretches the muscles in the back of the upper calf. Stand with your hands against a wall. Extend your left / right leg behind you, and bend your front knee slightly. Keeping your heels on the floor, your toes facing forward, and your back knee straight, shift your weight toward the wall. Do not arch your back. You should feel a gentle stretch in your upper calf. Hold this position for __________ seconds. Repeat __________ times. Complete this exercise __________ times a day. Soleus stretch, standing This exercise is also called a calf (soleus) stretch. It stretches the muscles in the back of the lower calf. Stand with your hands against a wall. Extend your left / right leg behind you, and bend your  front knee slightly. Keeping your heels on the floor and your toes facing forward, bend your back knee and shift your weight slightly over your back leg. You should feel a gentle stretch deep in your lower calf. Hold this position for __________ seconds. Repeat __________ times. Complete this exercise __________ times a day. Gastroc and soleus stretch, standing step This exercise stretches the muscles in the back of the lower leg. These muscles are in the upper calf (gastrocnemius) and the lower calf (soleus). Stand with the ball of your left / right foot on the front of a step. The ball of your foot is on the walking surface, right under your toes. Keep your other foot firmly on the same step. Hold on to the wall or a railing for balance. Slowly lift your other foot, allowing your body weight to press your heel down over the edge of the front of the step. Keep knee straight and unbent. You should feel a stretch in your calf. Hold this position for __________ seconds. Return both feet to the step. Repeat this exercise with a slight bend in your left / right knee. Repeat __________ times with your left / right knee straight and __________ times with your left / right knee bent. Complete this exercise __________ times a day. Balance exercise This exercise builds your balance and strength control of your arch to help take pressure off your plantar fascia. Single leg stand If this exercise is too easy, you can try it with your eyes closed or while standing on a pillow. Without shoes, stand near a   railing or in a doorway. You may hold on to the railing or door frame as needed. Stand on your left / right foot. Keep your big toe down on the floor and lift the arch of your foot. You should feel a stretch across the bottom of your foot and your arch. Do not let your foot roll inward. Hold this position for __________ seconds. Repeat __________ times. Complete this exercise __________ times a day. This  information is not intended to replace advice given to you by your health care provider. Make sure you discuss any questions you have with your health care provider. Document Revised: 04/22/2020 Document Reviewed: 04/22/2020 Elsevier Patient Education  2024 [employer].

## 2023-03-07 NOTE — Progress Notes (Signed)
Subjective:  Patient ID: Monique Tyler, female    DOB: [birth year]  Age: 57 y.o. MRN: [id number]  CC: Hospitalization Follow-up (Sharp pain in right foot)   HPI Monique Tyler is a 57 y.o. year old female with a history of chronic migraines, anxiety, right shoulder pain status post rotator cuff surgery in 07/2016, seasonal allergies, OSA (uses CPAP at night), migraines .  She presents for hospitalization follow-up from 02/22/2023 through 02/26/2023 for chest pain. Cardiac etiology was ruled out but imaging revealed mediastinal and hilar lymphadenopathy.  She underwent bronchoscopy and pathology revealed no malignant cells.  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  Acid-fast culture still in progress.  Urine culture was positive for Klebsiella and UTI which was treated. Echo revealed EF of 65 to 70%, hyperdynamic LV function, no regional wall motion abnormality.  Interval History: Discussed the use of AI scribe software for clinical note transcription with the patient, who gave verbal consent to proceed.  She presents for a follow-up visit. She reports experiencing milder chest pains when feeling anxious, a new symptom since her hospitalization. The patient describes these pains as similar but not as severe as the ones that led to her hospitalization. She has been managing these symptoms with coping skills such as breathing and relaxation techniques. The patient also mentions experiencing stress from work.  At her last visit due to increased stress her Prozac has been increased from 20 mg to 40 mg and she uses hydroxyzine as needed. She has an upcoming visit with cardiology later this month but does not have a pulmonary follow-up.  In addition to the chest pains, the patient reports having sharp pains in her feet when walking. She describes these pains as severe and has been managing them by wearing cushioned shoes. The patient's sleep is interrupted by dreams,  but she denies having hot flashes. She has been maintaining a regular sleep schedule and taking her medication as prescribed.        Past Medical History:  Diagnosis Date   Asthma    GERD (gastroesophageal reflux disease)    Hyperlipidemia    Hypertension    pt denies a hx of HTN   Kidney stones    Lung nodules    Migraine    Vertigo     Past Surgical History:  Procedure Laterality Date   BRONCHIAL NEEDLE ASPIRATION BIOPSY  02/26/2023   Procedure: BRONCHIAL NEEDLE ASPIRATION BIOPSIES;  Surgeon: Martina Sinner, MD;  Location: [facility];  Service: Pulmonary;;   BRONCHIAL WASHINGS  02/26/2023   Procedure: BRONCHIAL WASHINGS;  Surgeon: Martina Sinner, MD;  Location: [facility];  Service: Pulmonary;;   CESAREAN SECTION     ENDOBRONCHIAL ULTRASOUND Bilateral 02/26/2023   Procedure: ENDOBRONCHIAL ULTRASOUND;  Surgeon: Martina Sinner, MD;  Location: [facility];  Service: Pulmonary;  Laterality: Bilateral;   MANDIBLE SURGERY     TUBAL LIGATION     VIDEO BRONCHOSCOPY  02/26/2023   Procedure: VIDEO BRONCHOSCOPY WITHOUT FLUORO;  Surgeon: Martina Sinner, MD;  Location: [facility];  Service: Pulmonary;;    Family History  Problem Relation Age of Onset   Hypertension Daughter    Hypertension Son     Social History   Socioeconomic History   Marital status: Married    Spouse name: Not on file   Number of children: 7   Years of education: Not on file   Highest education level: Not on file  Occupational History   Not on file  Tobacco Use   Smoking status: Former    Types: Cigarettes    Passive exposure: Never   Smokeless tobacco: Never  Vaping Use   Vaping status: Never Used  Substance and Sexual Activity   Alcohol use: No    Alcohol/week: 0.0 standard drinks of alcohol   Drug use: No   Sexual activity: Yes    Birth control/protection: Surgical  Other Topics Concern   Not on file  Social History Narrative   Not on file   Social Determinants of Health    Financial Resource Strain: Not on file  Food Insecurity: No Food Insecurity (02/22/2023)   Hunger Vital Sign    Worried About Running Out of Food in the Last Year: Never true    Ran Out of Food in the Last Year: Never true  Transportation Needs: No Transportation Needs (02/22/2023)   PRAPARE - Administrator, Civil Service (Medical): No    Lack of Transportation (Non-Medical): No  Physical Activity: Not on file  Stress: Not on file  Social Connections: Not on file    Allergies  Allergen Reactions   Shellfish Allergy Swelling   Pork-Derived Products Nausea And Vomiting    Outpatient Medications Prior to Visit  Medication Sig Dispense Refill   acetaminophen (TYLENOL) 500 MG tablet Take 500 mg by mouth every 6 (six) hours as needed.     albuterol (VENTOLIN HFA) 108 (90 Base) MCG/ACT inhaler Inhale 2 puffs into the lungs every 4 (four) hours as needed for wheezing or shortness of breath. 18 g 1   alum & mag hydroxide-simeth (MAALOX MAX) 400-400-40 MG/5ML suspension Take 10 mLs by mouth 3 (three) times daily as needed for indigestion. 355 mL 0   amLODipine (NORVASC) 5 MG tablet Take 1 tablet (5 mg total) by mouth daily. 90 tablet 1   Ascorbic Acid (VITAMIN C) 1000 MG tablet Take 1 tablet (1,000 mg total) by mouth daily. 100 tablet 1   atorvastatin (LIPITOR) 40 MG tablet Take 1 tablet (40 mg total) by mouth daily. 90 tablet 1   cetirizine (ZYRTEC) 10 MG tablet Take 1 tablet (10 mg total) by mouth daily. 30 tablet 6   Cholecalciferol (VITAMIN D) 50 MCG (2000 UT) CAPS Take 1 capsule (2,000 Units total) by mouth daily. 90 capsule 1   FLUoxetine (PROZAC) 40 MG capsule Take 1 capsule (40 mg total) by mouth daily. 90 capsule 1   fluticasone (FLONASE) 50 MCG/ACT nasal spray Place 2 sprays into both nostrils daily. 16 g 1   gabapentin (NEURONTIN) 300 MG capsule Take 1 capsule (300 mg total) by mouth at bedtime. 90 capsule 1   hydrOXYzine (ATARAX) 25 MG tablet Take 1 tablet (25 mg total)  by mouth 3 (three) times daily as needed (Increase night time dose 50 mg). 270 tablet 1   meloxicam (MOBIC) 7.5 MG tablet Take 1 tablet (7.5 mg total) by mouth daily. 90 tablet 1   pantoprazole (PROTONIX) 40 MG tablet Take 1 tablet (40 mg total) by mouth daily. 30 tablet 0   topiramate (TOPAMAX) 100 MG tablet Take 1 tablet (100 mg total) by mouth at bedtime. 90 tablet 1   No facility-administered medications prior to visit.     ROS Review of Systems  Constitutional:  Negative for activity change and appetite change.  HENT:  Negative for sinus pressure and sore throat.   Respiratory:  Negative for chest tightness, shortness of breath and wheezing.   Cardiovascular:  Positive for chest pain. Negative for  palpitations.  Gastrointestinal:  Negative for abdominal distention, abdominal pain and constipation.  Genitourinary: Negative.   Musculoskeletal: Negative.   Psychiatric/Behavioral:  Negative for behavioral problems and dysphoric mood.     Objective:  BP 119/82   Pulse 70   Ht 5\' 7"  (1.702 m)   Wt 272 lb 6.4 oz (123.6 kg)   LMP 01/18/2012   SpO2 97%   BMI 42.66 kg/m      03/07/2023    9:41 AM 02/26/2023    2:40 PM 02/26/2023    2:30 PM  BP/Weight  Systolic BP 119 120 146  Diastolic BP 82 69 75  Wt. (Lbs) 272.4    BMI 42.66 kg/m2        Physical Exam Constitutional:      Appearance: She is well-developed.  Cardiovascular:     Rate and Rhythm: Normal rate.     Heart sounds: Normal heart sounds. No murmur heard. Pulmonary:     Effort: Pulmonary effort is normal.     Breath sounds: Normal breath sounds. No wheezing or rales.  Chest:     Chest wall: No tenderness.  Abdominal:     General: Bowel sounds are normal. There is no distension.     Palpations: Abdomen is soft. There is no mass.     Tenderness: There is no abdominal tenderness.  Musculoskeletal:        General: Normal range of motion.     Right lower leg: No edema.     Left lower leg: No edema.   Neurological:     Mental Status: She is alert and oriented to person, place, and time.  Psychiatric:        Mood and Affect: Mood normal.        Latest Ref Rng & Units 02/26/2023    3:52 AM 02/25/2023    4:37 AM 02/24/2023    5:00 AM  CMP  Glucose 70 - 99 mg/dL 403  474  259   BUN 6 - 20 mg/dL 16  14  11    Creatinine 0.44 - 1.00 mg/dL 5.63  8.75  6.43   Sodium 135 - 145 mmol/L 138  139  138   Potassium 3.5 - 5.1 mmol/L 3.7  3.8  3.8   Chloride 98 - 111 mmol/L 105  106  107   CO2 22 - 32 mmol/L 23  25  25    Calcium 8.9 - 10.3 mg/dL 8.9  9.2  8.9     Lipid Panel     Component Value Date/Time   CHOL 127 02/23/2023 0042   CHOL 146 08/17/2022 0925   TRIG 77 02/23/2023 0042   HDL 34 (L) 02/23/2023 0042   HDL 41 08/17/2022 0925   CHOLHDL 3.7 02/23/2023 0042   VLDL 15 02/23/2023 0042   LDLCALC 78 02/23/2023 0042   LDLCALC 88 08/17/2022 0925    CBC    Component Value Date/Time   WBC 5.4 02/23/2023 0042   RBC 4.44 02/23/2023 0042   HGB 12.1 02/23/2023 0042   HGB 12.6 02/15/2023 1005   HCT 41.9 02/23/2023 0042   HCT 40.0 02/15/2023 1005   PLT 269 02/23/2023 0042   PLT 306 02/15/2023 1005   MCV 94.4 02/23/2023 0042   MCV 86 02/15/2023 1005   MCH 27.3 02/23/2023 0042   MCHC 28.9 (L) 02/23/2023 0042   RDW 14.0 02/23/2023 0042   RDW 13.4 02/15/2023 1005   LYMPHSABS 1.4 02/15/2023 1005   MONOABS 0.8 06/25/2017 1801   EOSABS  0.1 02/15/2023 1005   BASOSABS 0.0 02/15/2023 1005    Lab Results  Component Value Date   HGBA1C 5.5 02/23/2023    Lab Results  Component Value Date   TSH 1.310 02/15/2023    Assessment & Plan:      Chest Pain Recent hospitalization for chest pain. No cardiac cause identified. Patient reports similar, milder pain with anxiety. -Upcoming appointment with cardiology for possible stress test -Continue current management strategies including use of hydroxyzine and coping skills. -Consider additional anxiety management strategies if symptoms  persist after pulmonology evaluation.  Lymphadenopathy Recent CT scan showed lymphadenopathy. Bronchoscopy revealed benign lymph nodes but presence of a fungal organism. -Refer to pulmonology for further evaluation and management of lymphadenopathy -I have sent a message to the pulmonologist who performed her bronchoscopy for decision on treatment of fungal organism.  Plantar Fasciitis Reports sharp pain in feet, particularly when walking. History of injections for pain relief. -Refer to podiatry for possible repeat injections. -Advise continued use of cushioned footwear, insoles  Anxiety Reports increased anxiety related to recent health concerns. -Continue Prozac 40mg  and hydroxyzine as needed; dose was increased about 3 weeks ago -Continue therapy and use of coping skills. -Consider additional medication if symptoms persist after pulmonology evaluation.  General Health Maintenance -Continue regular exercise as tolerated. -Continue current sleep schedule and medication regimen. -Check liver enzymes if symptoms of liver disease develop.          No orders of the defined types were placed in this encounter.   Follow-up: No follow-ups on file.       Hoy Register, MD, FAAFP. [facility] [place], [place] [phone]   03/07/2023, 10:21 AM

## 2023-03-10 NOTE — Progress Notes (Unsigned)
Cardiology Clinic Note   Date: 03/14/2023 ID: Monique Tyler, DOB 13-Aug-1965, MRN [id number]  Primary Cardiologist:  Maisie Fus, MD  Patient Profile    Monique Tyler is a 57 y.o. female who presents to the clinic today for hospital follow up.     Past medical history significant for: Chest pain/DOE. Nuclear stress test 12/13/2021: Normal low risk study. Echo 02/23/2023: EF 65 to 70%.  No RWMA.  Normal diastolic parameters.  Normal RV function.  Trivial MR. Hypertension. Hyperlipidemia. Lipid panel 02/23/2023: LDL 78, HDL 34, TG 77, total 127. OSA. Generalized anxiety. GERD. Asthma.     History of Present Illness    Monique Tyler was initially seen by cardiology on 04/24/2018 for chest pain at the request of Dr. Alvis Lemmings.  She had a normal echo and stress test.  She was not seen again until 12/07/2021 when she was evaluated by Dr. Wyline Mood for shortness of breath with exertion.  She had normal nuclear stress test.  Patient presented to the ED on 02/22/2023 with complaints of chest heaviness radiating to her back with associated shortness of breath and feeling hot.  Symptoms improved slightly after bowel movement.  Serial troponin negative.  EKG with no ischemic changes.  D-dimer elevated.  CTA with no evidence of PE but did show some mediastinal and upper abdominal lymphadenopathy.  She continued to complain of atypical chest pain and pleuritic symptoms.  Sed rate, CRP negative.  Echo showed normal LV/RV function, no RWMA, and normal diastolic parameters.  Given lymphadenopathy found on CTA she underwent bronchoscopy/EBUS.  Patient was discharged on 02/26/2023 to follow-up with PCP and pulmonology.  Today, patient is doing well. Lung biopsy showed fungal infection in her lungs and she is pending a follow up visit with pulmonology. She continues to have some fatigue since discharge. Chest pain has improved since starting Protonix. She reports mild edema bilateral feet that is normal in the morning  and progresses throughout the day if she is on her feet a lot. No shortness of breath or DOE. She has been walking 20 minutes a day with good tolerance.     ROS: All other systems reviewed and are otherwise negative except as noted in History of Present Illness.  Studies Reviewed       EKG is not ordered today.       Physical Exam    VS:  BP 136/74   Pulse 75   Wt 272 lb 9.6 oz (123.7 kg)   LMP 01/18/2012   SpO2 91%   BMI 42.70 kg/m  , BMI Body mass index is 42.7 kg/m.  GEN: Well nourished, well developed, in no acute distress. Neck: No JVD or carotid bruits. Cardiac:  RRR. No murmurs. No rubs or gallops.   Respiratory:  Respirations regular and unlabored. Clear to auscultation without rales, wheezing or rhonchi. GI: Soft, nontender, nondistended. Extremities: Radials/DP/PT 2+ and equal bilaterally. No clubbing or cyanosis. No edema.  Skin: Warm and dry, no rash. Neuro: Strength intact.  Assessment & Plan    Chest pain.  Recent hospital admission for chest pain.  Serial troponin negative.  EKG showed no ischemic changes.  Echo showed normal LV/RV function with no RWMA. She was found to have lymphadenopathy and underwent bronchoscopy/EBUS. She reports she just found out it showed a fungal infection. She is pending follow up with pulmonology.  Patient reports chest pain improving since starting Protonix.  Hypertension: BP today 136/74. Patient denies headaches, dizziness or vision changes. Continue amlodipine. Hyperlipidemia.  LDL August 2024 78, at goal.  Continue atorvastatin.  Disposition: Return in 6 months or sooner as needed.          Signed, Etta Grandchild. Emmalin Jaquess, DNP, NP-C

## 2023-03-12 ENCOUNTER — Encounter: Payer: Self-pay | Admitting: Pulmonary Disease

## 2023-03-12 ENCOUNTER — Telehealth: Payer: Self-pay | Admitting: Pulmonary Disease

## 2023-03-12 DIAGNOSIS — R590 Localized enlarged lymph nodes: Secondary | ICD-10-CM

## 2023-03-12 NOTE — Telephone Encounter (Signed)
I sent patient a message throughout my chart for a follow up visit in February 2025, please place a reminder to schedule a visit with me for that time. The visit is for mediastinal adenopathy.   Thanks, Dr. Francine Graven

## 2023-03-12 NOTE — Telephone Encounter (Signed)
Recall has been placed in patient's chart. When the February schedule is released, front staff will contact patient. Nothing further needed.

## 2023-03-12 NOTE — Telephone Encounter (Signed)
Patient is trying to call back and reach Dr.Dewald for her results. Will schedule a follow up appointment.

## 2023-03-14 ENCOUNTER — Ambulatory Visit: Payer: Self-pay | Attending: Student | Admitting: Student

## 2023-03-14 ENCOUNTER — Encounter: Payer: Self-pay | Admitting: Student

## 2023-03-14 VITALS — BP 136/74 | HR 75 | Wt 272.6 lb

## 2023-03-14 DIAGNOSIS — R0789 Other chest pain: Secondary | ICD-10-CM

## 2023-03-14 DIAGNOSIS — I10 Essential (primary) hypertension: Secondary | ICD-10-CM

## 2023-03-14 DIAGNOSIS — E7800 Pure hypercholesterolemia, unspecified: Secondary | ICD-10-CM

## 2023-03-14 NOTE — Patient Instructions (Signed)
Medication Instructions:  *If you need a refill on your cardiac medications before your next appointment, please call your pharmacy*   Lab Work: If you have labs (blood work) drawn today and your tests are completely normal, you will receive your results only by: MyChart Message (if you have MyChart) OR A paper copy in the mail If you have any lab test that is abnormal or we need to change your treatment, we will call you to review the results.   Follow-Up: At [facility], you and your health needs are our priority.  As part of our continuing mission to provide you with exceptional heart care, we have created designated Provider Care Teams.  These Care Teams include your primary Cardiologist (physician) and Advanced Practice Providers (APPs -  Physician Assistants and Nurse Practitioners) who all work together to provide you with the care you need, when you need it.  We recommend signing up for the patient portal called "MyChart".  Sign up information is provided on this After Visit Summary.  MyChart is used to connect with patients for Virtual Visits (Telemedicine).  Patients are able to view lab/test results, encounter notes, upcoming appointments, etc.  Non-urgent messages can be sent to your provider as well.   To learn more about what you can do with MyChart, go to [email or web address].    Your next appointment:   6 month(s) a letter will be mailed to you as reminder to call the office for your follow up appointment.  Provider:   Maisie Fus, MD

## 2023-03-21 ENCOUNTER — Ambulatory Visit: Payer: Self-pay | Admitting: Podiatry

## 2023-03-21 ENCOUNTER — Other Ambulatory Visit (HOSPITAL_BASED_OUTPATIENT_CLINIC_OR_DEPARTMENT_OTHER): Payer: Self-pay

## 2023-03-23 ENCOUNTER — Other Ambulatory Visit (HOSPITAL_BASED_OUTPATIENT_CLINIC_OR_DEPARTMENT_OTHER): Payer: Self-pay

## 2023-03-23 ENCOUNTER — Other Ambulatory Visit: Payer: Self-pay | Admitting: Family Medicine

## 2023-03-23 MED ORDER — PANTOPRAZOLE SODIUM 40 MG PO TBEC
40.0000 mg | DELAYED_RELEASE_TABLET | Freq: Every day | ORAL | 0 refills | Status: DC
  Filled 2023-03-23 – 2023-05-30 (×3): qty 90, 90d supply, fill #0

## 2023-03-23 NOTE — Telephone Encounter (Signed)
Requested medications are due for refill today.  yes  Requested medications are on the active medications list.  yes  Last refill. 02/27/2023 #30 0 rf  Future visit scheduled.   no  Notes to clinic.  Rx signed by Elsworth Soho    Requested Prescriptions  Pending Prescriptions Disp Refills   pantoprazole (PROTONIX) 40 MG tablet 30 tablet 0    Sig: Take 1 tablet (40 mg total) by mouth daily.     Gastroenterology: Proton Pump Inhibitors Passed - 03/23/2023  3:08 PM      Passed - Valid encounter within last 12 months    Recent Outpatient Visits           2 weeks ago Hilar lymphadenopathy   [place] [facility] Hoy Register, MD   1 month ago Screening for colon cancer   [place] [facility] Hoy Register, MD   7 months ago Screening for colon cancer   [place] [facility] Hoy Register, MD   1 year ago Chronic pain of right knee   [facility] Hoy Register, MD   1 year ago Palpitation   [place] [facility] Hoy Register, MD       Future Appointments             In 3 weeks Dewald, Bettina Gavia, MD [facility]

## 2023-04-03 ENCOUNTER — Other Ambulatory Visit (HOSPITAL_BASED_OUTPATIENT_CLINIC_OR_DEPARTMENT_OTHER): Payer: Self-pay

## 2023-04-12 LAB — ACID FAST CULTURE WITH REFLEXED SENSITIVITIES (MYCOBACTERIA): Acid Fast Culture: NEGATIVE

## 2023-04-16 ENCOUNTER — Ambulatory Visit: Payer: Self-pay | Admitting: Pulmonary Disease

## 2023-05-30 ENCOUNTER — Other Ambulatory Visit: Payer: Self-pay | Admitting: Family Medicine

## 2023-05-30 ENCOUNTER — Other Ambulatory Visit: Payer: Self-pay

## 2023-05-30 DIAGNOSIS — F419 Anxiety disorder, unspecified: Secondary | ICD-10-CM

## 2023-05-30 DIAGNOSIS — G5603 Carpal tunnel syndrome, bilateral upper limbs: Secondary | ICD-10-CM

## 2023-05-30 MED ORDER — FLUOXETINE HCL 40 MG PO CAPS
40.0000 mg | ORAL_CAPSULE | Freq: Every day | ORAL | 0 refills | Status: DC
Start: 2023-05-30 — End: 2023-08-30
  Filled 2023-05-30 (×2): qty 90, 90d supply, fill #0

## 2023-05-30 MED ORDER — GABAPENTIN 300 MG PO CAPS
300.0000 mg | ORAL_CAPSULE | Freq: Every day | ORAL | 1 refills | Status: DC
Start: 2023-05-30 — End: 2023-10-10
  Filled 2023-05-30: qty 90, 90d supply, fill #0

## 2023-06-07 ENCOUNTER — Telehealth: Payer: Self-pay | Admitting: Family Medicine

## 2023-06-07 DIAGNOSIS — R3989 Other symptoms and signs involving the genitourinary system: Secondary | ICD-10-CM

## 2023-06-08 ENCOUNTER — Other Ambulatory Visit: Payer: Self-pay

## 2023-06-08 MED ORDER — CEPHALEXIN 500 MG PO CAPS
500.0000 mg | ORAL_CAPSULE | Freq: Two times a day (BID) | ORAL | 0 refills | Status: DC
Start: 2023-06-08 — End: 2023-07-04
  Filled 2023-06-08: qty 14, 7d supply, fill #0

## 2023-06-08 NOTE — Progress Notes (Signed)
E-Visit for Urinary Problems  We are sorry that you are not feeling well.  Here is how we plan to help!  Based on what you shared with me it looks like you most likely have a simple urinary tract infection.  A UTI (Urinary Tract Infection) is a bacterial infection of the bladder.  Most cases of urinary tract infections are simple to treat but a key part of your care is to encourage you to drink plenty of fluids and watch your symptoms carefully.  I have prescribed Keflex 500 mg twice a day for 7 days.  Your symptoms should gradually improve. Call us if the burning in your urine worsens, you develop worsening fever, back pain or pelvic pain or if your symptoms do not resolve after completing the antibiotic.  Urinary tract infections can be prevented by drinking plenty of water to keep your body hydrated.  Also be sure when you wipe, wipe from front to back and don't hold it in!  If possible, empty your bladder every 4 hours.  HOME CARE Drink plenty of fluids Compete the full course of the antibiotics even if the symptoms resolve Remember, when you need to go.go. Holding in your urine can increase the likelihood of getting a UTI! GET HELP RIGHT AWAY IF: You cannot urinate You get a high fever Worsening back pain occurs You see blood in your urine You feel sick to your stomach or throw up You feel like you are going to pass out  MAKE SURE YOU  Understand these instructions. Will watch your condition. Will get help right away if you are not doing well or get worse.   Thank you for choosing an e-visit.  Your e-visit answers were reviewed by a board certified advanced clinical practitioner to complete your personal care plan. Depending upon the condition, your plan could have included both over the counter or prescription medications.  Please review your pharmacy choice. Make sure the pharmacy is open so you can pick up prescription now. If there is a problem, you may contact your  provider through MyChart messaging and have the prescription routed to another pharmacy.  Your safety is important to us. If you have drug allergies check your prescription carefully.   For the next 24 hours you can use MyChart to ask questions about today's visit, request a non-urgent call back, or ask for a work or school excuse. You will get an email in the next two days asking about your experience. I hope that your e-visit has been valuable and will speed your recovery.  I have spent 5 minutes in review of e-visit questionnaire, review and updating patient chart, medical decision making and response to patient.   Shiheem Corporan M Miliana Gangwer, PA-C  

## 2023-07-04 ENCOUNTER — Encounter (HOSPITAL_BASED_OUTPATIENT_CLINIC_OR_DEPARTMENT_OTHER): Payer: Self-pay | Admitting: Emergency Medicine

## 2023-07-04 ENCOUNTER — Encounter (HOSPITAL_COMMUNITY)
Admission: EM | Disposition: A | Discharge status: Home / Self Care | Payer: Self-pay | Source: Home / Self Care | Attending: Emergency Medicine

## 2023-07-04 ENCOUNTER — Other Ambulatory Visit: Payer: Self-pay

## 2023-07-04 ENCOUNTER — Emergency Department (HOSPITAL_COMMUNITY): Payer: Self-pay

## 2023-07-04 ENCOUNTER — Ambulatory Visit (HOSPITAL_BASED_OUTPATIENT_CLINIC_OR_DEPARTMENT_OTHER)
Admission: EM | Admit: 2023-07-04 | Discharge: 2023-07-04 | Disposition: A | Discharge status: Home / Self Care | Payer: Self-pay | Attending: Emergency Medicine | Admitting: Emergency Medicine

## 2023-07-04 ENCOUNTER — Emergency Department (HOSPITAL_COMMUNITY): Payer: Self-pay | Admitting: Anesthesiology

## 2023-07-04 ENCOUNTER — Emergency Department (HOSPITAL_BASED_OUTPATIENT_CLINIC_OR_DEPARTMENT_OTHER): Payer: Self-pay

## 2023-07-04 ENCOUNTER — Emergency Department (HOSPITAL_BASED_OUTPATIENT_CLINIC_OR_DEPARTMENT_OTHER): Payer: Self-pay | Admitting: Anesthesiology

## 2023-07-04 DIAGNOSIS — M1990 Unspecified osteoarthritis, unspecified site: Secondary | ICD-10-CM | POA: Insufficient documentation

## 2023-07-04 DIAGNOSIS — E66813 Obesity, class 3: Secondary | ICD-10-CM | POA: Insufficient documentation

## 2023-07-04 DIAGNOSIS — F419 Anxiety disorder, unspecified: Secondary | ICD-10-CM | POA: Insufficient documentation

## 2023-07-04 DIAGNOSIS — A419 Sepsis, unspecified organism: Secondary | ICD-10-CM

## 2023-07-04 DIAGNOSIS — I10 Essential (primary) hypertension: Secondary | ICD-10-CM | POA: Insufficient documentation

## 2023-07-04 DIAGNOSIS — N3000 Acute cystitis without hematuria: Secondary | ICD-10-CM | POA: Insufficient documentation

## 2023-07-04 DIAGNOSIS — Z6841 Body Mass Index (BMI) 40.0 and over, adult: Secondary | ICD-10-CM | POA: Insufficient documentation

## 2023-07-04 DIAGNOSIS — R42 Dizziness and giddiness: Secondary | ICD-10-CM | POA: Insufficient documentation

## 2023-07-04 DIAGNOSIS — N201 Calculus of ureter: Secondary | ICD-10-CM | POA: Insufficient documentation

## 2023-07-04 DIAGNOSIS — R519 Headache, unspecified: Secondary | ICD-10-CM | POA: Insufficient documentation

## 2023-07-04 DIAGNOSIS — N390 Urinary tract infection, site not specified: Secondary | ICD-10-CM

## 2023-07-04 DIAGNOSIS — K219 Gastro-esophageal reflux disease without esophagitis: Secondary | ICD-10-CM | POA: Insufficient documentation

## 2023-07-04 DIAGNOSIS — Z79899 Other long term (current) drug therapy: Secondary | ICD-10-CM | POA: Insufficient documentation

## 2023-07-04 DIAGNOSIS — J45909 Unspecified asthma, uncomplicated: Secondary | ICD-10-CM | POA: Insufficient documentation

## 2023-07-04 DIAGNOSIS — Z87891 Personal history of nicotine dependence: Secondary | ICD-10-CM | POA: Insufficient documentation

## 2023-07-04 DIAGNOSIS — Z87442 Personal history of urinary calculi: Secondary | ICD-10-CM | POA: Insufficient documentation

## 2023-07-04 DIAGNOSIS — G4733 Obstructive sleep apnea (adult) (pediatric): Secondary | ICD-10-CM | POA: Insufficient documentation

## 2023-07-04 DIAGNOSIS — N200 Calculus of kidney: Secondary | ICD-10-CM

## 2023-07-04 DIAGNOSIS — Z791 Long term (current) use of non-steroidal anti-inflammatories (NSAID): Secondary | ICD-10-CM | POA: Insufficient documentation

## 2023-07-04 DIAGNOSIS — B9689 Other specified bacterial agents as the cause of diseases classified elsewhere: Secondary | ICD-10-CM

## 2023-07-04 HISTORY — PX: CYSTOSCOPY WITH STENT PLACEMENT: SHX5790

## 2023-07-04 LAB — COMPREHENSIVE METABOLIC PANEL
ALT: 22 U/L (ref 0–44)
AST: 17 U/L (ref 15–41)
Albumin: 4 g/dL (ref 3.5–5.0)
Alkaline Phosphatase: 146 U/L — ABNORMAL HIGH (ref 38–126)
Anion gap: 10 (ref 5–15)
BUN: 14 mg/dL (ref 6–20)
CO2: 22 mmol/L (ref 22–32)
Calcium: 9.6 mg/dL (ref 8.9–10.3)
Chloride: 109 mmol/L (ref 98–111)
Creatinine, Ser: 1.23 mg/dL — ABNORMAL HIGH (ref 0.44–1.00)
GFR, Estimated: 51 mL/min — ABNORMAL LOW (ref >60)
Glucose, Bld: 144 mg/dL — ABNORMAL HIGH (ref 70–99)
Potassium: 3.5 mmol/L (ref 3.5–5.1)
Sodium: 141 mmol/L (ref 135–145)
Total Bilirubin: 0.5 mg/dL (ref <1.2)
Total Protein: 6.8 g/dL (ref 6.5–8.1)

## 2023-07-04 LAB — URINALYSIS, ROUTINE W REFLEX MICROSCOPIC
Bilirubin Urine: NEGATIVE
Glucose, UA: NEGATIVE mg/dL
Ketones, ur: NEGATIVE mg/dL
Nitrite: POSITIVE — AB
Protein, ur: NEGATIVE mg/dL
Specific Gravity, Urine: 1.017 (ref 1.005–1.030)
pH: 6.5 (ref 5.0–8.0)

## 2023-07-04 LAB — LIPASE, BLOOD: Lipase: 46 U/L (ref 11–51)

## 2023-07-04 LAB — CBC
HCT: 37.9 % (ref 36.0–46.0)
Hemoglobin: 11.8 g/dL — ABNORMAL LOW (ref 12.0–15.0)
MCH: 26.3 pg (ref 26.0–34.0)
MCHC: 31.1 g/dL (ref 30.0–36.0)
MCV: 84.6 fL (ref 80.0–100.0)
Platelets: 284 10*3/uL (ref 150–400)
RBC: 4.48 MIL/uL (ref 3.87–5.11)
RDW: 13.9 % (ref 11.5–15.5)
WBC: 6.3 10*3/uL (ref 4.0–10.5)
nRBC: 0 % (ref 0.0–0.2)

## 2023-07-04 SURGERY — CYSTOSCOPY, WITH STENT INSERTION
Anesthesia: General | Laterality: Right

## 2023-07-04 MED ORDER — SULFAMETHOXAZOLE-TRIMETHOPRIM 800-160 MG PO TABS
1.0000 | ORAL_TABLET | Freq: Two times a day (BID) | ORAL | 0 refills | Status: DC
  Filled 2023-07-04: qty 14, 7d supply, fill #0

## 2023-07-04 MED ORDER — OXYCODONE HCL 5 MG/5ML PO SOLN: 5.0000 mg | Freq: Once | ORAL | Status: DC | PRN

## 2023-07-04 MED ORDER — HYDROMORPHONE HCL 1 MG/ML IJ SOLN
1.0000 mg | Freq: Once | INTRAMUSCULAR | Status: AC
  Administered 2023-07-04: 1 mg via INTRAVENOUS
  Filled 2023-07-04: qty 1

## 2023-07-04 MED ORDER — SODIUM CHLORIDE 0.9 % IR SOLN
Status: DC | PRN
  Administered 2023-07-04: 3000 mL

## 2023-07-04 MED ORDER — OXYCODONE HCL 5 MG PO TABS: 5.0000 mg | ORAL_TABLET | Freq: Once | ORAL | Status: DC | PRN

## 2023-07-04 MED ORDER — AMISULPRIDE (ANTIEMETIC) 5 MG/2ML IV SOLN: 10.0000 mg | Freq: Once | INTRAVENOUS | Status: DC | PRN

## 2023-07-04 MED ORDER — DEXAMETHASONE SODIUM PHOSPHATE 4 MG/ML IJ SOLN
INTRAMUSCULAR | Status: DC | PRN
  Administered 2023-07-04: 8 mg via INTRAVENOUS

## 2023-07-04 MED ORDER — ONDANSETRON HCL 4 MG/2ML IJ SOLN
INTRAMUSCULAR | Status: DC | PRN
  Administered 2023-07-04: 4 mg via INTRAVENOUS

## 2023-07-04 MED ORDER — ONDANSETRON HCL 4 MG/2ML IJ SOLN
4.0000 mg | Freq: Once | INTRAMUSCULAR | Status: AC
  Administered 2023-07-04: 4 mg via INTRAVENOUS
  Filled 2023-07-04: qty 2

## 2023-07-04 MED ORDER — ACETAMINOPHEN 10 MG/ML IV SOLN
INTRAVENOUS | Status: DC | PRN
  Administered 2023-07-04: 1000 mg via INTRAVENOUS

## 2023-07-04 MED ORDER — IOHEXOL 300 MG/ML  SOLN
INTRAMUSCULAR | Status: DC | PRN
  Administered 2023-07-04: 10 mL

## 2023-07-04 MED ORDER — SODIUM CHLORIDE 0.9 % IV SOLN: 12.5000 mg | INTRAVENOUS | Status: DC | PRN

## 2023-07-04 MED ORDER — LACTATED RINGERS IV SOLN: INTRAVENOUS | Status: DC | PRN

## 2023-07-04 MED ORDER — KETOROLAC TROMETHAMINE 15 MG/ML IJ SOLN
15.0000 mg | Freq: Once | INTRAMUSCULAR | Status: AC
  Administered 2023-07-04: 15 mg via INTRAVENOUS
  Filled 2023-07-04: qty 1

## 2023-07-04 MED ORDER — PHENAZOPYRIDINE HCL 200 MG PO TABS
200.0000 mg | ORAL_TABLET | Freq: Three times a day (TID) | ORAL | 0 refills | Status: DC | PRN
  Filled 2023-07-04: qty 10, 4d supply, fill #0

## 2023-07-04 MED ORDER — ACETAMINOPHEN 10 MG/ML IV SOLN
INTRAVENOUS | Status: AC
  Filled 2023-07-04: qty 100

## 2023-07-04 MED ORDER — LIDOCAINE HCL (CARDIAC) PF 100 MG/5ML IV SOSY
PREFILLED_SYRINGE | INTRAVENOUS | Status: DC | PRN
  Administered 2023-07-04: 70 mg via INTRAVENOUS

## 2023-07-04 MED ORDER — FENTANYL CITRATE PF 50 MCG/ML IJ SOSY: 25.0000 ug | PREFILLED_SYRINGE | INTRAMUSCULAR | Status: DC | PRN

## 2023-07-04 MED ORDER — PROCHLORPERAZINE EDISYLATE 10 MG/2ML IJ SOLN
10.0000 mg | Freq: Once | INTRAMUSCULAR | Status: AC
  Administered 2023-07-04: 10 mg via INTRAVENOUS
  Filled 2023-07-04: qty 2

## 2023-07-04 MED ORDER — SODIUM CHLORIDE 0.9 % IV SOLN
2.0000 g | Freq: Once | INTRAVENOUS | Status: AC
  Administered 2023-07-04: 2 g via INTRAVENOUS
  Filled 2023-07-04: qty 20

## 2023-07-04 MED ORDER — PROPOFOL 10 MG/ML IV BOLUS
INTRAVENOUS | Status: DC | PRN
  Administered 2023-07-04: 200 mg via INTRAVENOUS

## 2023-07-04 MED ORDER — CEFAZOLIN SODIUM-DEXTROSE 1-4 GM/50ML-% IV SOLN
INTRAVENOUS | Status: DC | PRN
  Administered 2023-07-04: 2 g via INTRAVENOUS

## 2023-07-04 MED ORDER — TRAMADOL HCL 50 MG PO TABS
50.0000 mg | ORAL_TABLET | Freq: Four times a day (QID) | ORAL | 0 refills | Status: DC | PRN
  Filled 2023-07-04: qty 10, 2d supply, fill #0

## 2023-07-04 MED ORDER — MIDAZOLAM HCL 5 MG/5ML IJ SOLN
INTRAMUSCULAR | Status: DC | PRN
  Administered 2023-07-04: 1 mg via INTRAVENOUS

## 2023-07-04 SURGICAL SUPPLY — 10 items
BAG URO CATCHER STRL LF (MISCELLANEOUS) ×1 IMPLANT
CATH URETL OPEN 5X70 (CATHETERS) ×1 IMPLANT
CLOTH BEACON ORANGE TIMEOUT ST (SAFETY) ×1 IMPLANT
GLOVE SURG LX STRL 7.5 STRW (GLOVE) ×1 IMPLANT
GOWN STRL REUS W/ TWL XL LVL3 (GOWN DISPOSABLE) ×1 IMPLANT
GUIDEWIRE STR DUAL SENSOR (WIRE) ×1 IMPLANT
KIT TURNOVER KIT A (KITS) IMPLANT
MANIFOLD NEPTUNE II (INSTRUMENTS) ×1 IMPLANT
PACK CYSTO (CUSTOM PROCEDURE TRAY) ×1 IMPLANT
TUBING CONNECTING 10 (TUBING) IMPLANT

## 2023-07-04 NOTE — ED Notes (Signed)
Patient transported to CT 

## 2023-07-04 NOTE — ED Notes (Signed)
Report given to Dayton Scrape RN at [facility] PACU

## 2023-07-04 NOTE — ED Notes (Signed)
ED Provider at bedside. 

## 2023-07-04 NOTE — Plan of Care (Signed)
Plan of Care Note for accepted transfer  Patient: Monique Tyler              ZOX:[id number]  DOA: 07/04/2023      Facility course: 57 year old female history of hypertension, hyperlipidemia, morbid obesity, nephrolithiasis, OSA, migraine, anxiety, neuropathy and mediastinal and hilar lymphadenopathy (unclear if patient is following up with outpatient pulmonology or not) please emergency department evaluation for right-sided abdominal pain, nausea for last 1 hour    At presentation to ED patient found to have moderately repeated blood pressure which improved.  Otherwise hemodynamically stable  Lipase 46 within normal range. CMP unremarkable except elevated creatinine 1.23. CBC unremarkable except slightly low hemoglobin 11.8 (baseline hemoglobin around 12.1 to 12.7). UA hazy appearance, large hemoglobin, nitrate positive, leukocyte esterase large and bacteria present.  CT renal stone study showed 3 mm distal right ureteral stone with mild right-sided hydronephrosis.  As patient has been history of complicated nephrolithiasis, development of early sepsis and obstructing stone with right-sided hydronephrosis ED physician spoke with urology Dr.Herrrick>> patient is directly going [facility].  [facility] will be reach out after postop for admission request per discussion with patient placement, Dr. Pilar Plate and Dr. Pilar Plate will let urology know to reconsult [facility] for the admission.

## 2023-07-04 NOTE — ED Provider Notes (Signed)
[facility] Emergency Department Provider Note MRN:  [id number]  Arrival date & time: 07/04/23     Chief Complaint   Abdominal Pain   History of Present Illness   Monique Tyler is a 57 y.o. year-old female with a history of GERD presenting to the ED with chief complaint of abdominal pain.  Right flank pain starting about 1 to 2 hours ago, severe, radiating to the right lower quadrant, right pelvis.  Associated with nausea vomiting.  Review of Systems  A thorough review of systems was obtained and all systems are negative except as noted in the HPI and PMH.   Patient's Health History    Past Medical History:  Diagnosis Date   Asthma    GERD (gastroesophageal reflux disease)    Hyperlipidemia    Hypertension    pt denies a hx of HTN   Kidney stones    Lung nodules    Migraine    Vertigo     Past Surgical History:  Procedure Laterality Date   BRONCHIAL NEEDLE ASPIRATION BIOPSY  02/26/2023   Procedure: BRONCHIAL NEEDLE ASPIRATION BIOPSIES;  Surgeon: Martina Sinner, MD;  Location: [facility];  Service: Pulmonary;;   BRONCHIAL WASHINGS  02/26/2023   Procedure: BRONCHIAL WASHINGS;  Surgeon: Martina Sinner, MD;  Location: [facility];  Service: Pulmonary;;   CESAREAN SECTION     ENDOBRONCHIAL ULTRASOUND Bilateral 02/26/2023   Procedure: ENDOBRONCHIAL ULTRASOUND;  Surgeon: Martina Sinner, MD;  Location: [facility];  Service: Pulmonary;  Laterality: Bilateral;   MANDIBLE SURGERY     TUBAL LIGATION     VIDEO BRONCHOSCOPY  02/26/2023   Procedure: VIDEO BRONCHOSCOPY WITHOUT FLUORO;  Surgeon: Martina Sinner, MD;  Location: [facility];  Service: Pulmonary;;    Family History  Problem Relation Age of Onset   Hypertension Daughter    Hypertension Son     Social History   Socioeconomic History   Marital status: Married    Spouse name: Not on file   Number of children: 7   Years of education: Not on file   Highest education level: Not on  file  Occupational History   Not on file  Tobacco Use   Smoking status: Former    Types: Cigarettes    Passive exposure: Never   Smokeless tobacco: Never  Vaping Use   Vaping status: Never Used  Substance and Sexual Activity   Alcohol use: No    Alcohol/week: 0.0 standard drinks of alcohol   Drug use: No   Sexual activity: Yes    Birth control/protection: Surgical  Other Topics Concern   Not on file  Social History Narrative   Not on file   Social Determinants of Health   Financial Resource Strain: Not on file  Food Insecurity: No Food Insecurity (02/22/2023)   Hunger Vital Sign    Worried About Running Out of Food in the Last Year: Never true    Ran Out of Food in the Last Year: Never true  Transportation Needs: No Transportation Needs (02/22/2023)   PRAPARE - Administrator, Civil Service (Medical): No    Lack of Transportation (Non-Medical): No  Physical Activity: Not on file  Stress: Not on file  Social Connections: Not on file  Intimate Partner Violence: Not At Risk (02/22/2023)   Humiliation, Afraid, Rape, and Kick questionnaire    Fear of Current or Ex-Partner: No    Emotionally Abused: No    Physically Abused: No    Sexually  Abused: No     Physical Exam   Vitals:   07/04/23 0508 07/04/23 0530  BP:  (!) 134/93  Pulse:  94  Resp:    Temp: 99.4 F (37.4 C)   SpO2:  92%    CONSTITUTIONAL: Well-appearing, moderate distress due to pain NEURO/PSYCH:  Alert and oriented x 3, no focal deficits EYES:  eyes equal and reactive ENT/NECK:  no LAD, no JVD CARDIO: Regular rate, well-perfused, normal S1 and S2 PULM:  CTAB no wheezing or rhonchi GI/GU:  non-distended, non-tender MSK/SPINE:  No gross deformities, no edema SKIN:  no rash, atraumatic   *Additional and/or pertinent findings included in MDM below  Diagnostic and Interventional Summary    EKG Interpretation Date/Time:    Ventricular Rate:    PR Interval:    QRS Duration:    QT  Interval:    QTC Calculation:   R Axis:      Text Interpretation:         Labs Reviewed  CBC - Abnormal; Notable for the following components:      Result Value   Hemoglobin 11.8 (*)    All other components within normal limits  COMPREHENSIVE METABOLIC PANEL - Abnormal; Notable for the following components:   Glucose, Bld 144 (*)    Creatinine, Ser 1.23 (*)    Alkaline Phosphatase 146 (*)    GFR, Estimated 51 (*)    All other components within normal limits  URINALYSIS, ROUTINE W REFLEX MICROSCOPIC - Abnormal; Notable for the following components:   APPearance HAZY (*)    Hgb urine dipstick LARGE (*)    Nitrite POSITIVE (*)    Leukocytes,Ua LARGE (*)    Bacteria, UA RARE (*)    All other components within normal limits  LIPASE, BLOOD    CT RENAL STONE STUDY  Final Result      Medications  ondansetron (ZOFRAN) injection 4 mg (4 mg Intravenous Given 07/04/23 0119)  HYDROmorphone (DILAUDID) injection 1 mg (1 mg Intravenous Given 07/04/23 0120)  ketorolac (TORADOL) 15 MG/ML injection 15 mg (15 mg Intravenous Given 07/04/23 0120)  HYDROmorphone (DILAUDID) injection 1 mg (1 mg Intravenous Given 07/04/23 0251)  ketorolac (TORADOL) 15 MG/ML injection 15 mg (15 mg Intravenous Given 07/04/23 0249)  ondansetron (ZOFRAN) injection 4 mg (4 mg Intravenous Given 07/04/23 0248)  cefTRIAXone (ROCEPHIN) 2 g in sodium chloride 0.9 % 100 mL IVPB (0 g Intravenous Stopped 07/04/23 0419)  prochlorperazine (COMPAZINE) injection 10 mg (10 mg Intravenous Given 07/04/23 0517)  HYDROmorphone (DILAUDID) injection 1 mg (1 mg Intravenous Given 07/04/23 0520)     Procedures  /  Critical Care .Critical Care  Performed by: Sabas Sous, MD Authorized by: Sabas Sous, MD   Critical care provider statement:    Critical care time (minutes):  35   Critical care was necessary to treat or prevent imminent or life-threatening deterioration of the following conditions: Concern for early sepsis, need  for emergent urological procedure.   Critical care was time spent personally by me on the following activities:  Development of treatment plan with patient or surrogate, discussions with consultants, evaluation of patient's response to treatment, examination of patient, ordering and review of laboratory studies, ordering and review of radiographic studies, ordering and performing treatments and interventions, pulse oximetry, re-evaluation of patient's condition and review of old charts   ED Course and Medical Decision Making  Initial Impression and Ddx Suspicious for kidney stone, less likely appendicitis, diverticulitis, pyelonephritis, UTI.  Past medical/surgical history  that increases complexity of ED encounter: History of hypertension, kidney stones  Interpretation of Diagnostics I personally reviewed the laboratory assessment and my interpretation is as follows: No significant blood count or electrolyte disturbance  Urinalysis consistent with infection, CT reveals 3 mm stone on the right  Patient Reassessment and Ultimate Disposition/Management     Patient required continued pain control, urology consulted given the signs of urinary tract infection and concomitant kidney stone.  Patient began exhibiting diffuse diaphoresis and felt very warm, oral temp 99.4, concern for beginnings of systemic symptoms of infection and/or early sepsis.  Given this plan is for admission.  Dr. Marlou Porch recommending transfer to the PACU at [facility] for emergent stent placement prior to hospitalist admission.  Patient management required discussion with the following services or consulting groups:  Hospitalist Service and Urology  Complexity of Problems Addressed Acute illness or injury that poses threat of life of bodily function  Additional Data Reviewed and Analyzed Further history obtained from: Further history from spouse/family member  Additional Factors Impacting ED Encounter Risk Use of  parenteral controlled substances and Consideration of hospitalization  Elmer Sow. Pilar Plate, MD [facility] [email or web address]  Final Clinical Impressions(s) / ED Diagnoses     ICD-10-CM   1. Kidney stone  N20.0     2. Acute cystitis without hematuria  N30.00     3. Sepsis, due to unspecified organism, unspecified whether acute organ dysfunction present [facility])  A41.9       ED Discharge Orders     None        Discharge Instructions Discussed with and Provided to Patient:   Discharge Instructions   None      Sabas Sous, MD 07/04/23 [phone]

## 2023-07-04 NOTE — ED Triage Notes (Signed)
Patient presents with right sided abd pain and nausea x 1 hour. Reports pain radiating to lower abd. Denies dysuria. Reports faint feeling

## 2023-07-04 NOTE — Anesthesia Postprocedure Evaluation (Signed)
Anesthesia Post Note  Patient: Monique Tyler  Procedure(s) Performed: CYSTOSCOPY AND RETROGRADE PYELOGRAM (Right)     Patient location during evaluation: PACU Anesthesia Type: General Level of consciousness: awake and alert Pain management: pain level controlled Vital Signs Assessment: post-procedure vital signs reviewed and stable Respiratory status: spontaneous breathing, nonlabored ventilation, respiratory function stable and patient connected to nasal cannula oxygen Cardiovascular status: blood pressure returned to baseline and stable Postop Assessment: no apparent nausea or vomiting Anesthetic complications: no   No notable events documented.  Last Vitals:  Vitals:   07/04/23 0945 07/04/23 1006  BP: (!) 140/78 (!) 148/83  Pulse: 84 80  Resp: 16   Temp:  37.7 C  SpO2: 91% 93%    Last Pain:  Vitals:   07/04/23 1007  TempSrc:   PainSc: Asleep                 Beryle Lathe

## 2023-07-04 NOTE — Anesthesia Procedure Notes (Addendum)
Procedure Name: LMA Insertion Date/Time: 07/04/2023 8:22 AM  Performed by: Dennison Nancy, CRNAPre-anesthesia Checklist: Patient identified, Emergency Drugs available, Suction available, Patient being monitored and Timeout performed Patient Re-evaluated:Patient Re-evaluated prior to induction Oxygen Delivery Method: Circle system utilized Preoxygenation: Pre-oxygenation with 100% oxygen Induction Type: IV induction Ventilation: Mask ventilation without difficulty LMA: LMA inserted LMA Size: 4.0 Number of attempts: 1 Tube secured with: Tape Dental Injury: Teeth and Oropharynx as per pre-operative assessment

## 2023-07-04 NOTE — Consult Note (Signed)
I have been asked to see the patient by Dr. Kennis Carina, for evaluation and management of right distal ureteral stone and UTI/fever.  History of present illness: 57 year old female presented to the emergency department with right-sided flank pain.  The pain had been present only for a few hours.  She had associated nausea and vomiting.  She was having frequent urination and dysuria.  At the time of her initial presentation she was afebrile.  Workup in the ER demonstrated Microscopic hematuria and bacteriuria with nitrite positive urine.  She had a normal white blood cell count.  She had a CT scan that was performed as well that demonstrated a 4 mm right distal ureteral stone and associated hydronephrosis.  Initially, the intent was to treat the patient with medical expulsion therapy and place her on antibiotics.  However, she developed rigors and diaphoresis in the ED.  Further, her temperature started to rise.  As such, decision was made to transfer the patient to the operating room for stent placement.  The patient does state that she has passed a kidney stone before, but has no relationship with any urologist.  Review of systems: A 12 point comprehensive review of systems was obtained and is negative unless otherwise stated in the history of present illness.    Patient Active Problem List   Diagnosis Date Noted   UTI due to Klebsiella species 02/26/2023   Non-cardiac chest pain 02/23/2023   Noncardiac chest pain 02/22/2023   Essential hypertension 02/22/2023   Hyperlipidemia 02/22/2023   GAD (generalized anxiety disorder) 02/22/2023   Chronic pain syndrome 02/22/2023   Obesity, Class III, BMI 40-49.9 (morbid obesity) (HCC) 04/05/2022   Morbid obesity (HCC) 04/05/2022   Primary osteoarthritis of right knee 03/21/2022   Plantar fasciitis of right foot 03/01/2022   OA (osteoarthritis) of knee 03/14/2021   SI (sacroiliac) joint dysfunction 03/14/2021   Hip impingement syndrome, right  03/14/2021   Mediastinal adenopathy 07/05/2018   Obstructive sleep apnea 06/14/2018   Anxiety 03/15/2018   Status post right rotator cuff repair 02/28/2017   Seasonal allergies 02/28/2017   Migraine 11/30/2015   Benign paroxysmal positional vertigo 11/30/2015   Avitaminosis D 11/28/2011    No current facility-administered medications on file prior to encounter.   Current Outpatient Medications on File Prior to Encounter  Medication Sig Dispense Refill   acetaminophen (TYLENOL) 500 MG tablet Take 500 mg by mouth every 6 (six) hours as needed.     albuterol (VENTOLIN HFA) 108 (90 Base) MCG/ACT inhaler Inhale 2 puffs into the lungs every 4 (four) hours as needed for wheezing or shortness of breath. 18 g 1   alum & mag hydroxide-simeth (MAALOX MAX) 400-400-40 MG/5ML suspension Take 10 mLs by mouth 3 (three) times daily as needed for indigestion. 355 mL 0   amLODipine (NORVASC) 5 MG tablet Take 1 tablet (5 mg total) by mouth daily. 90 tablet 1   Ascorbic Acid (VITAMIN C) 1000 MG tablet Take 1 tablet (1,000 mg total) by mouth daily. 100 tablet 1   atorvastatin (LIPITOR) 40 MG tablet Take 1 tablet (40 mg total) by mouth daily. 90 tablet 1   cephALEXin (KEFLEX) 500 MG capsule Take 1 capsule (500 mg total) by mouth 2 (two) times daily. 14 capsule 0   cetirizine (ZYRTEC) 10 MG tablet Take 1 tablet (10 mg total) by mouth daily. 30 tablet 6   Cholecalciferol (VITAMIN D) 50 MCG (2000 UT) CAPS Take 1 capsule (2,000 Units total) by mouth daily. 90 capsule 1  FLUoxetine (PROZAC) 40 MG capsule Take 1 capsule (40 mg total) by mouth daily. 90 capsule 0   fluticasone (FLONASE) 50 MCG/ACT nasal spray Place 2 sprays into both nostrils daily. 16 g 1   gabapentin (NEURONTIN) 300 MG capsule Take 1 capsule (300 mg total) by mouth at bedtime. 90 capsule 1   hydrOXYzine (ATARAX) 25 MG tablet Take 1 tablet (25 mg total) by mouth 3 (three) times daily as needed (Increase night time dose 50 mg). 270 tablet 1    meloxicam (MOBIC) 7.5 MG tablet Take 1 tablet (7.5 mg total) by mouth daily. 90 tablet 1   pantoprazole (PROTONIX) 40 MG tablet Take 1 tablet (40 mg total) by mouth daily. 90 tablet 0   topiramate (TOPAMAX) 100 MG tablet Take 1 tablet (100 mg total) by mouth at bedtime. 90 tablet 1    Past Medical History:  Diagnosis Date   Asthma    GERD (gastroesophageal reflux disease)    Hyperlipidemia    Hypertension    pt denies a hx of HTN   Kidney stones    Lung nodules    Migraine    Vertigo     Past Surgical History:  Procedure Laterality Date   BRONCHIAL NEEDLE ASPIRATION BIOPSY  02/26/2023   Procedure: BRONCHIAL NEEDLE ASPIRATION BIOPSIES;  Surgeon: Martina Sinner, MD;  Location: [facility];  Service: Pulmonary;;   BRONCHIAL WASHINGS  02/26/2023   Procedure: BRONCHIAL WASHINGS;  Surgeon: Martina Sinner, MD;  Location: [facility];  Service: Pulmonary;;   CESAREAN SECTION     ENDOBRONCHIAL ULTRASOUND Bilateral 02/26/2023   Procedure: ENDOBRONCHIAL ULTRASOUND;  Surgeon: Martina Sinner, MD;  Location: [facility];  Service: Pulmonary;  Laterality: Bilateral;   MANDIBLE SURGERY     TUBAL LIGATION     VIDEO BRONCHOSCOPY  02/26/2023   Procedure: VIDEO BRONCHOSCOPY WITHOUT FLUORO;  Surgeon: Martina Sinner, MD;  Location: [facility];  Service: Pulmonary;;    Social History   Tobacco Use   Smoking status: Former    Types: Cigarettes    Passive exposure: Never   Smokeless tobacco: Never  Vaping Use   Vaping status: Never Used  Substance Use Topics   Alcohol use: No    Alcohol/week: 0.0 standard drinks of alcohol   Drug use: No    Family History  Problem Relation Age of Onset   Hypertension Daughter    Hypertension Son     PE: Vitals:   07/04/23 0500 07/04/23 0508 07/04/23 0530 07/04/23 0640  BP: (!) 136/94  (!) 134/93 136/73  Pulse: 81  94 86  Resp:    13  Temp:  99.4 F (37.4 C)  99.6 F (37.6 C)  TempSrc:  Oral    SpO2: 94%  92% 95%   Patient appears  to be in no acute distress  patient is alert and oriented x3 Atraumatic normocephalic head No cervical or supraclavicular lymphadenopathy appreciated No increased work of breathing, no audible wheezes/rhonchi Regular sinus tachycardia Abdomen is soft, nontender, nondistended, right lower abdominal pain and CVA tenderness Lower extremities are symmetric without appreciable edema Grossly neurologically intact No identifiable skin lesions  Recent Labs    07/04/23 0039  WBC 6.3  HGB 11.8*  HCT 37.9   Recent Labs    07/04/23 0039  NA 141  K 3.5  CL 109  CO2 22  GLUCOSE 144*  BUN 14  CREATININE 1.23*  CALCIUM 9.6   No results for input(s): "LABPT", "INR" in the last 72 hours.  No results for input(s): "LABURIN" in the last 72 hours. Results for orders placed or performed during the hospital encounter of 02/22/23  Urine Culture (for pregnant, neutropenic or urologic patients or patients with an indwelling urinary catheter)     Status: Abnormal   Collection Time: 02/24/23  9:39 PM   Specimen: Urine, Clean Catch  Result Value Ref Range Status   Specimen Description   Final    URINE, CLEAN CATCH Performed at [facility], [address], [place], [place] [address]    Special Requests   Final    NONE Performed at [facility], [address] [place], [place] [address]    Culture >=100,000 COLONIES/mL KLEBSIELLA PNEUMONIAE (A)  Final   Report Status 02/26/2023 FINAL  Final   Organism ID, Bacteria KLEBSIELLA PNEUMONIAE (A)  Final      Susceptibility   Klebsiella pneumoniae - MIC*    AMPICILLIN RESISTANT Resistant     CEFAZOLIN <=4 SENSITIVE Sensitive     CEFEPIME <=0.12 SENSITIVE Sensitive     CEFTRIAXONE <=0.25 SENSITIVE Sensitive     CIPROFLOXACIN <=0.25 SENSITIVE Sensitive     GENTAMICIN <=1 SENSITIVE Sensitive     IMIPENEM <=0.25 SENSITIVE Sensitive     NITROFURANTOIN 64 INTERMEDIATE Intermediate     TRIMETH/SULFA <=20 SENSITIVE  Sensitive     AMPICILLIN/SULBACTAM 4 SENSITIVE Sensitive     PIP/TAZO <=4 SENSITIVE Sensitive     * >=100,000 COLONIES/mL KLEBSIELLA PNEUMONIAE  Culture, Respiratory w Gram Stain     Status: None   Collection Time: 02/26/23  2:00 PM   Specimen: Bronchoalveolar Lavage; Respiratory  Result Value Ref Range Status   Specimen Description   Final    BRONCHIAL ALVEOLAR LAVAGE Performed at [facility], [address], [place], [place] [address]    Special Requests   Final    NONE Performed at [facility], [address] [place], [place] [address]    Gram Stain   Final    RARE WBC PRESENT, PREDOMINANTLY PMN NO ORGANISMS SEEN    Culture   Final    NO GROWTH 2 DAYS Performed at [facility], [address], [place], [place] [address]    Report Status 03/01/2023 FINAL  Final  Acid Fast Smear (AFB)     Status: None   Collection Time: 02/26/23  2:00 PM   Specimen: Bronchial Alveolar Lavage; Respiratory  Result Value Ref Range Status   AFB Specimen Processing Concentration  Final   Acid Fast Smear Negative  Final    Comment: (NOTE) Performed At: [facility] [address] [place], [place] [id number] Jolene Schimke MD LK:[id number]    Source (AFB) BRONCHIAL ALVEOLAR LAVAGE  Final    Comment: Performed at [facility], [address], [place], [place] [address]  Acid Fast Culture with reflexed sensitivities     Status: None   Collection Time: 02/26/23  2:00 PM   Specimen: Bronchial Alveolar Lavage; Respiratory  Result Value Ref Range Status   Acid Fast Culture Negative  Final    Comment: (NOTE) No acid fast bacilli isolated after 6 weeks. Performed At: [facility] [address] [place], [place] [id number] Jolene Schimke MD LO:[id number]    Source of Sample BRONCHIAL ALVEOLAR LAVAGE  Final    Comment: Performed at [facility], [address] [place], [place] [address]  Fungus  Culture With Stain     Status: Abnormal   Collection Time: 02/26/23  2:00 PM   Specimen: Bronchial Alveolar Lavage; Respiratory  Result Value Ref Range Status   Fungus Stain Final report (A)  Final   Fungus (Mycology) Culture Final report  Final    Comment: (NOTE) Performed At: [facility] [address] [place], [place] [id number] Jolene Schimke MD NW:[id number]    Fungal Source BRONCHIAL ALVEOLAR LAVAGE  Final    Comment: Performed at [facility], [address], [place], [place] [address]  Fungus Culture Result     Status: Abnormal   Collection Time: 02/26/23  2:00 PM  Result Value Ref Range Status   Result 1 Comment (A)  Final    Comment: (NOTE) Fungal elements, such as arthroconidia, hyphal fragments, chlamydoconidia, observed. Performed At: [facility] [address] [place], [place] [id number] Jolene Schimke MD LK:[id number]   Fungal organism reflex     Status: None   Collection Time: 02/26/23  2:00 PM  Result Value Ref Range Status   Fungal result 1 Comment  Final    Comment: (NOTE) No yeast or mold isolated after 4 weeks. Performed At: [facility] [address] [place], [place] [id number] Jolene Schimke MD QV:[id number]     Imaging: CT scan of the abdomen pelvis demonstrates right-sided hydroureteronephrosis and a distal obstructing stone.  I reviewed the scan independently and discussed with the patient.  Imp: 57 year old female with a history of recurrent urinary tract infections has a distal ureteral stone and climbing fever with rigors.  Without adequate source control she has impending sepsis.  Recommendations: Given the patient's rising fever, urine analysis, and history of recurrent urinary tract infections I recommended that we proceed to the OR on an urgent basis for right ureteral stent placement.  I spoke to the patient about this recommendation.  We discussed alternatives.  She understands that this will  help manage her UTI, but will ultimately require a second surgery to remove the stone.  She will be admitted to the hospital service.    Crist Fat

## 2023-07-04 NOTE — Transfer of Care (Signed)
Immediate Anesthesia Transfer of Care Note  Patient: Monique Tyler  Procedure(s) Performed: CYSTOSCOPY AND RETROGRADE PYELOGRAM (Right)  Patient Location: PACU  Anesthesia Type:General  Level of Consciousness: awake, alert , oriented, and patient cooperative  Airway & Oxygen Therapy: Patient Spontanous Breathing and Patient connected to face mask oxygen  Post-op Assessment: Report given to RN and Post -op Vital signs reviewed and stable  Post vital signs: Reviewed and stable  Last Vitals:  Vitals Value Taken Time  BP 154/86 07/04/23 0902  Temp 37.4 C 07/04/23 0902  Pulse 82 07/04/23 0907  Resp 18 07/04/23 0907  SpO2 96 % 07/04/23 0907  Vitals shown include unfiled device data.  Last Pain:  Vitals:   07/04/23 0902  TempSrc:   PainSc: Asleep         Complications: No notable events documented.

## 2023-07-04 NOTE — Op Note (Signed)
Preoperative diagnosis:  Right distal ureteral stone and UTI  Postoperative diagnosis:  Passed right ureteral stone  Procedure: Cystoscopy, right retrograde pyelogram with interpretation  Surgeon: Crist Fat, MD  Anesthesia: General  Complications: None  Intraoperative findings:  #1: The patient had a mildly prolapsed bladder with orthotopic ureteral orifice ease.  There was significant sediment and erythema within the bladder wall. #2: The retrograde pyelogram demonstrated normal caliber ureter with no hydroureteronephrosis. #3: The patient was noted to be in the corner of the patient's bladder, she had passed it.  EBL: Minimal  Specimens: None  Indication: Earnest Armfield is a 57 y.o. patient with low-grade fever, pain, and a small right distal ureteral stone with concern for UTI.  After reviewing the management options for treatment, he elected to proceed with the above surgical procedure(s). We have discussed the potential benefits and risks of the procedure, side effects of the proposed treatment, the likelihood of the patient achieving the goals of the procedure, and any potential problems that might occur during the procedure or recuperation. Informed consent has been obtained.  Description of procedure:  Consent was obtained the preoperative holding area.  She was brought back to the operating room placed on table in supine position.  General esthesia was then induced endotracheal tube was inserted.  She was placed in dorsolithotomy position and prepped and draped in the routine sterile fashion.  Timeout was subsequently performed.  21 French degree cystoscope was gently passed to the patient's urethra into the bladder under visual guidance.  The bladder was emptied and slowly filled up and cystoscopy performed with above findings.  I then used a 5 [place] manage ureteral catheter and performed retrograde pyelogram with the above findings.  Given the normal retrograde I  filled the bladder up completely and the stone was noted to be in the back corner of the bladder.  I drained the bladder and terminated the case.  The patient was subsequently extubated to the PACU in stable condition.  Disposition: The patient is being discharged home with antibiotics.  She will follow-up with urology in 6 weeks.

## 2023-07-04 NOTE — Anesthesia Preprocedure Evaluation (Addendum)
Anesthesia Evaluation  Patient identified by MRN, date of birth, ID band Patient awake    Reviewed: Allergy & Precautions, NPO status , Patient's Chart, lab work & pertinent test results  History of Anesthesia Complications Negative for: history of anesthetic complications  Airway Mallampati: III  TM Distance: >3 FB Neck ROM: Full    Dental  (+) Dental Advisory Given, Teeth Intact   Pulmonary asthma , sleep apnea and Continuous Positive Airway Pressure Ventilation , former smoker   Pulmonary exam normal        Cardiovascular hypertension, Pt. on medications Normal cardiovascular exam     Neuro/Psych  Headaches PSYCHIATRIC DISORDERS Anxiety      Vertigo     GI/Hepatic Neg liver ROS,GERD  Medicated and Controlled,,  Endo/Other    Class 3 obesity  Renal/GU Renal InsufficiencyRenal disease     Musculoskeletal  (+) Arthritis ,    Abdominal   Peds  Hematology negative hematology ROS (+)   Anesthesia Other Findings   Reproductive/Obstetrics                             Anesthesia Physical Anesthesia Plan  ASA: 3  Anesthesia Plan: General   Post-op Pain Management: Tylenol PO (pre-op)*   Induction: Intravenous  PONV Risk Score and Plan: 3 and Treatment may vary due to age or medical condition, Ondansetron, Dexamethasone, Midazolam and Scopolamine patch - Pre-op  Airway Management Planned: LMA  Additional Equipment: None  Intra-op Plan:   Post-operative Plan: Extubation in OR  Informed Consent: I have reviewed the patients History and Physical, chart, labs and discussed the procedure including the risks, benefits and alternatives for the proposed anesthesia with the patient or authorized representative who has indicated his/her understanding and acceptance.     Dental advisory given  Plan Discussed with: CRNA and Anesthesiologist  Anesthesia Plan Comments:          Anesthesia Quick Evaluation

## 2023-07-05 ENCOUNTER — Encounter (HOSPITAL_COMMUNITY): Payer: Self-pay | Admitting: Urology

## 2023-07-09 LAB — AEROBIC/ANAEROBIC CULTURE W GRAM STAIN (SURGICAL/DEEP WOUND): Gram Stain: NONE SEEN

## 2023-07-09 LAB — CALCULI, WITH PHOTOGRAPH (CLINICAL LAB)
Calcium Oxalate Dihydrate: 60 %
Calcium Oxalate Monohydrate: 10 %
Hydroxyapatite: 30 %
Weight Calculi: 26 mg

## 2023-08-14 ENCOUNTER — Other Ambulatory Visit: Payer: Self-pay

## 2023-08-14 ENCOUNTER — Emergency Department (HOSPITAL_BASED_OUTPATIENT_CLINIC_OR_DEPARTMENT_OTHER)
Admission: EM | Admit: 2023-08-14 | Discharge: 2023-08-15 | Disposition: A | Discharge status: Home / Self Care | Payer: Self-pay | Attending: Emergency Medicine | Admitting: Emergency Medicine

## 2023-08-14 DIAGNOSIS — Z79899 Other long term (current) drug therapy: Secondary | ICD-10-CM | POA: Insufficient documentation

## 2023-08-14 DIAGNOSIS — L02414 Cutaneous abscess of left upper limb: Secondary | ICD-10-CM | POA: Insufficient documentation

## 2023-08-14 DIAGNOSIS — I10 Essential (primary) hypertension: Secondary | ICD-10-CM | POA: Insufficient documentation

## 2023-08-14 NOTE — ED Triage Notes (Signed)
Pt POV from home reporting cyst below L elbow, noticed it last week. Hx of same in same place, drained on its own.

## 2023-08-15 ENCOUNTER — Other Ambulatory Visit (HOSPITAL_COMMUNITY): Payer: Self-pay

## 2023-08-15 ENCOUNTER — Other Ambulatory Visit: Payer: Self-pay

## 2023-08-15 ENCOUNTER — Other Ambulatory Visit: Payer: Self-pay | Admitting: Family Medicine

## 2023-08-15 DIAGNOSIS — I10 Essential (primary) hypertension: Secondary | ICD-10-CM

## 2023-08-15 MED ORDER — DOXYCYCLINE HYCLATE 100 MG PO TABS
100.0000 mg | ORAL_TABLET | Freq: Once | ORAL | Status: AC
  Administered 2023-08-15: 100 mg via ORAL
  Filled 2023-08-15: qty 1

## 2023-08-15 MED ORDER — ACETAMINOPHEN 325 MG PO TABS
650.0000 mg | ORAL_TABLET | Freq: Once | ORAL | Status: AC
  Administered 2023-08-15: 650 mg via ORAL
  Filled 2023-08-15: qty 2

## 2023-08-15 MED ORDER — AMLODIPINE BESYLATE 5 MG PO TABS
5.0000 mg | ORAL_TABLET | Freq: Every day | ORAL | 0 refills | Status: DC
  Filled 2023-08-15 (×2): qty 90, 90d supply, fill #0

## 2023-08-15 MED ORDER — LIDOCAINE-EPINEPHRINE (PF) 2 %-1:200000 IJ SOLN
10.0000 mL | Freq: Once | INTRAMUSCULAR | Status: AC
  Administered 2023-08-15: 10 mL
  Filled 2023-08-15: qty 20

## 2023-08-15 MED ORDER — DOXYCYCLINE HYCLATE 100 MG PO CAPS
100.0000 mg | ORAL_CAPSULE | Freq: Two times a day (BID) | ORAL | 0 refills | Status: AC
  Filled 2023-08-15: qty 14, 7d supply, fill #0

## 2023-08-15 NOTE — Discharge Instructions (Signed)
Take acetaminophen and/or ibuprofen as needed for pain.

## 2023-08-15 NOTE — ED Provider Notes (Signed)
[place] EMERGENCY DEPARTMENT AT [facility] Provider Note   CSN: [id number] Arrival date & time: 08/14/23  2214     History  Chief Complaint  Patient presents with   Cyst    Monique Tyler is a 58 y.o. female.  The history is provided by the patient.  She has history of hypertension, hyperlipidemia and comes in complaining of a swollen area on her left elbow.  It has been present for a week.  She has been using some warm compresses, but it is getting bigger.  She denies fever or chills.  She had a similar issue about 2 months ago but it drained spontaneously.   Home Medications Prior to Admission medications   Medication Sig Start Date End Date Taking? Authorizing Provider  acetaminophen (TYLENOL) 500 MG tablet Take 500 mg by mouth every 6 (six) hours as needed.    [provider]  albuterol (VENTOLIN HFA) 108 (90 Base) MCG/ACT inhaler Inhale 2 puffs into the lungs every 4 (four) hours as needed for wheezing or shortness of breath. 11/30/20   Hoy Register, MD  alum & mag hydroxide-simeth (MAALOX MAX) 400-400-40 MG/5ML suspension Take 10 mLs by mouth 3 (three) times daily as needed for indigestion. 02/26/23   Marcelino Duster, MD  amLODipine (NORVASC) 5 MG tablet Take 1 tablet (5 mg total) by mouth daily. 02/15/23   Hoy Register, MD  Ascorbic Acid (VITAMIN C) 1000 MG tablet Take 1 tablet (1,000 mg total) by mouth daily. 08/30/22   Hoy Register, MD  atorvastatin (LIPITOR) 40 MG tablet Take 1 tablet (40 mg total) by mouth daily. 02/15/23   Hoy Register, MD  cetirizine (ZYRTEC) 10 MG tablet Take 1 tablet (10 mg total) by mouth daily. 11/03/19   Hoy Register, MD  Cholecalciferol (VITAMIN D) 50 MCG (2000 UT) CAPS Take 1 capsule (2,000 Units total) by mouth daily. 03/16/21   Anders Simmonds, PA-C  FLUoxetine (PROZAC) 40 MG capsule Take 1 capsule (40 mg total) by mouth daily. 05/30/23   Hoy Register, MD  fluticasone (FLONASE) 50 MCG/ACT nasal spray Place 2  sprays into both nostrils daily. 11/03/19   Hoy Register, MD  gabapentin (NEURONTIN) 300 MG capsule Take 1 capsule (300 mg total) by mouth at bedtime. 05/30/23   Hoy Register, MD  hydrOXYzine (ATARAX) 25 MG tablet Take 1 tablet (25 mg total) by mouth 3 (three) times daily as needed (Increase night time dose 50 mg). 02/15/23   Hoy Register, MD  meloxicam (MOBIC) 7.5 MG tablet Take 1 tablet (7.5 mg total) by mouth daily. 08/17/22   Hoy Register, MD  pantoprazole (PROTONIX) 40 MG tablet Take 1 tablet (40 mg total) by mouth daily. 03/23/23   Hoy Register, MD  phenazopyridine (PYRIDIUM) 200 MG tablet Take 1 tablet (200 mg total) by mouth 3 (three) times daily as needed for pain. 07/04/23   Crist Fat, MD  sulfamethoxazole-trimethoprim (BACTRIM DS) 800-160 MG tablet Take 1 tablet by mouth 2 (two) times daily. Start taking one day prior to your appointment for your first follow-up and catheter removal.  Continue taking for three days. 07/04/23   Crist Fat, MD  topiramate (TOPAMAX) 100 MG tablet Take 1 tablet (100 mg total) by mouth at bedtime. 02/15/23   Hoy Register, MD  traMADol (ULTRAM) 50 MG tablet Take 1-2 tablets (50-100 mg total) by mouth every 6 (six) hours as needed for moderate pain (pain score 4-6). 07/04/23   Crist Fat, MD      Allergies  Shellfish allergy and Pork-derived products    Review of Systems   Review of Systems  All other systems reviewed and are negative.   Physical Exam Updated Vital Signs BP (!) 182/93   Pulse 81   Temp 98.5 F (36.9 C)   Resp 18   Ht 5\' 7"  (1.702 m)   Wt 122.5 kg   LMP 01/18/2012   SpO2 100%   BMI 42.29 kg/m  Physical Exam Vitals and nursing note reviewed.   58 year old female, resting comfortably and in no acute distress. Vital signs are significant for elevated blood pressure. Oxygen saturation is 100%, which is normal. Head is normocephalic and atraumatic. PERRLA, EOMI.  Lungs are clear without  rales, wheezes, or rhonchi. Chest is nontender. Heart has regular rate and rhythm without murmur. Extremities: There is an area of fluctuance and erythema on the dorsal surface of the proximal left forearm which is surrounded by an area of induration.  The fluctuant area measures 3 cm in diameter, indurated area 5 cm in diameter.  There are no lymphangitic streaks seen.  This is not on the olecranon.  There is no no peripheral edema, full range of motion is present. Skin is warm and dry without rash. Neurologic: Mental status is normal, moves all extremities equally.    ED Results / Procedures / Treatments   Labs (all labs ordered are listed, but only abnormal results are displayed) Labs Reviewed - No data to display  EKG None  Radiology No results found.  Procedures .Incision and Drainage  Date/Time: 08/15/2023 1:28 AM  Performed by: Dione Booze, MD Authorized by: Dione Booze, MD   Consent:    Consent obtained:  Verbal   Consent given by:  Patient   Risks, benefits, and alternatives were discussed: yes     Risks discussed:  Bleeding, pain and incomplete drainage   Alternatives discussed:  No treatment Universal protocol:    Procedure explained and questions answered to patient or proxy's satisfaction: yes     Relevant documents present and verified: yes     Required blood products, implants, devices, and special equipment available: yes     Site/side marked: yes     Immediately prior to procedure, a time out was called: yes     Patient identity confirmed:  Verbally with patient and arm band Location:    Type:  Abscess   Size:  3 cm   Location:  Upper extremity   Upper extremity location:  Elbow   Elbow location:  L elbow Pre-procedure details:    Skin preparation:  Antiseptic wash Sedation:    Sedation type:  None Anesthesia:    Anesthesia method:  Local infiltration   Local anesthetic:  Lidocaine 2% WITH epi Procedure type:    Complexity:  Complex Procedure  details:    Ultrasound guidance: no     Needle aspiration: yes     Needle size:  18 G (Only very small amount of pus was able to be aspirated)   Incision types:  Cruciate   Incision depth:  Subcutaneous   Wound management:  Probed and deloculated   Drainage:  Purulent   Drainage amount:  Moderate (Drained thick, purulent material)   Wound treatment:  Wound left open   Packing materials:  None Post-procedure details:    Procedure completion:  Tolerated well, no immediate complications     Medications Ordered in ED Medications  doxycycline (VIBRA-TABS) tablet 100 mg (has no administration in time range)  lidocaine-EPINEPHrine (XYLOCAINE  W/EPI) 2 %-1:200000 (PF) injection 10 mL (10 mLs Infiltration Given by Other 08/15/23 0040)  acetaminophen (TYLENOL) tablet 650 mg (650 mg Oral Given 08/15/23 0125)    ED Course/ Medical Decision Making/ A&P                                 Medical Decision Making Risk OTC drugs. Prescription drug management.   Abscess of the left proximal forearm/elbow treated with incision and drainage.  I am discharging her with a prescription for doxycycline.  Final Clinical Impression(s) / ED Diagnoses Final diagnoses:  Abscess of left elbow    Rx / DC Orders ED Discharge Orders          Ordered    doxycycline (VIBRAMYCIN) 100 MG capsule  2 times daily        08/15/23 0127              Dione Booze, MD 08/15/23 0130

## 2023-08-15 NOTE — ED Notes (Signed)
Gauze placed and wrapped over wound per EDP.

## 2023-08-30 ENCOUNTER — Other Ambulatory Visit: Payer: Self-pay

## 2023-08-30 ENCOUNTER — Telehealth: Payer: Self-pay | Admitting: *Deleted

## 2023-08-30 ENCOUNTER — Other Ambulatory Visit: Payer: Self-pay | Admitting: Family Medicine

## 2023-08-30 DIAGNOSIS — F419 Anxiety disorder, unspecified: Secondary | ICD-10-CM

## 2023-08-30 DIAGNOSIS — G43709 Chronic migraine without aura, not intractable, without status migrainosus: Secondary | ICD-10-CM

## 2023-08-30 DIAGNOSIS — Z9189 Other specified personal risk factors, not elsewhere classified: Secondary | ICD-10-CM

## 2023-08-30 MED ORDER — ATORVASTATIN CALCIUM 40 MG PO TABS
40.0000 mg | ORAL_TABLET | Freq: Every day | ORAL | 0 refills | Status: DC
  Filled 2023-08-30: qty 30, 30d supply, fill #0

## 2023-08-30 MED ORDER — TOPIRAMATE 100 MG PO TABS
100.0000 mg | ORAL_TABLET | Freq: Every day | ORAL | 0 refills | Status: DC
  Filled 2023-08-30: qty 30, 30d supply, fill #0

## 2023-08-30 MED ORDER — FLUOXETINE HCL 40 MG PO CAPS
40.0000 mg | ORAL_CAPSULE | Freq: Every day | ORAL | 0 refills | Status: DC
  Filled 2023-08-30: qty 30, 30d supply, fill #0

## 2023-08-30 NOTE — Telephone Encounter (Addendum)
 Dr. Millard team follow up required: Patient needs assistance with obtaining a replacement Fecal occult blood test (FIT) kit and is planning to come by office on 08/31/23 to pick up a new kit.  Please contact patient at [phone].  You may also contact Monta Fell BSN, RN,CCM, Health Equity Program Manager (HEPM) at [phone] if additional information needed.   Chart reviewed and Kerr-[id number] verification completed.   Chart review highlights and potential barriers to colorectal cancer screening (CRC) screening: Per patient's health maintenance, patient has never had colonoscopy.  Patient may have had colonoscopy in the past, approximately 5 years ago, prior to 04/08/20 referral note conversation.  Per 12/06/10 operative note, patient had colonoscopy  with normal results completed on 05/18/03 at [facility]. Colonoscopy results located in 12/06/10 operative note. Per 04/08/20 referral note, patient stated that she would fax colonoscopy report to [facility].   Patient 's most recent GI referral was closed on 05/08/22 because patient did not return call to [facility] to schedule.  Per 08/02/18 referral note, patient states she did not have insurance and would call back when obtained to schedule. Patient also had GI referrals placed 06/09/21, 02/02/20, 07/15/18 and 11/15/17, all are currently closed.  Patient had Fecal occult blood test (FIT) ordered on 02/15/23, 08/17/22, and 09/23/18, no results in [facility].   Telephone call to patient, verify patient's name, date of birth, and address. Patient in agreement to outreach and colon cancer screening (CRC) follow up. Discussed patient's colon cancer screening history per chart review.  Discussed the importance of cancer screening, patient voices understanding, and is in agreement. Patient states she been very busy and not prioritized her health in the past. States she has FIT kits in the past and did not complete. Patient states she is willing to  obtain CRC screening FIT and will follow up with Dr. Millard office to obtain replacement FIT kit on 08/31/23. Patient states she has no barriers to getting FIT. Patient verbally given HEPM's direct contact number and advised  HEPM will send an update to Dr. Newlin regarding this conversation. Patient in agreement with follow up and no other questions at this time. Patient has HEPM direct extension ([phone]) and Dr. Millard contact number and states she will contact us  if any further assistance is needed. Stated she is so appreciative of the call. No further HEPM intervention needed at this time.    Nobel Brar H. Fell BURKITT, CHARITY FUNDRAISER, [facility] [phone]

## 2023-08-30 NOTE — Telephone Encounter (Signed)
 Requested medication (s) are due for refill today: yes   Requested medication (s) are on the active medication list: yes   Last refill:  lipitor- 02/15/23 #90 1 refills, topamax - 02/15/23 #90 1 refills, prozac - 05/30/23 #90 0 refills   Future visit scheduled: no  Notes to clinic:  protocol failed last labs lipitor 08/17/22, topamax  07/04/23. No refills remain for prozac . Do you want to refill Rxs?     Requested Prescriptions  Pending Prescriptions Disp Refills   atorvastatin  (LIPITOR) 40 MG tablet 90 tablet 1    Sig: Take 1 tablet (40 mg total) by mouth daily.     Cardiovascular:  Antilipid - Statins Failed - 08/30/2023  2:53 PM      Failed - Lipid Panel in normal range within the last 12 months    Cholesterol, Total  Date Value Ref Range Status  08/17/2022 146 100 - 199 mg/dL Final   Cholesterol  Date Value Ref Range Status  02/23/2023 127 0 - 200 mg/dL Final   LDL Chol Calc (NIH)  Date Value Ref Range Status  08/17/2022 88 0 - 99 mg/dL Final   LDL Cholesterol  Date Value Ref Range Status  02/23/2023 78 0 - 99 mg/dL Final    Comment:           Total Cholesterol/HDL:CHD Risk Coronary Heart Disease Risk Table                     Men   Women  1/2 Average Risk   3.4   3.3  Average Risk       5.0   4.4  2 X Average Risk   9.6   7.1  3 X Average Risk  23.4   11.0        Use the calculated Patient Ratio above and the CHD Risk Table to determine the patient's CHD Risk.        ATP III CLASSIFICATION (LDL):  <100     mg/dL   Optimal  899-870  mg/dL   Near or Above                    Optimal  130-159  mg/dL   Borderline  839-810  mg/dL   High  >809     mg/dL   Very High Performed at [facility], [address], [place], [place] [address]    HDL  Date Value Ref Range Status  02/23/2023 34 (L) >40 mg/dL Final  98/74/7975 41 >60 mg/dL Final   Triglycerides  Date Value Ref Range Status  02/23/2023 77 <150 mg/dL Final         Passed - Patient is  not pregnant      Passed - Valid encounter within last 12 months    Recent Outpatient Visits           5 months ago Hilar lymphadenopathy   [facility] [place]. [facility] Delbert Clam, MD   6 months ago Screening for colon cancer   [facility] [place]. [facility] Delbert Clam, MD   1 year ago Screening for colon cancer   [facility] [place]. [facility] Delbert Clam, MD   1 year ago Chronic pain of right knee   [facility] [place] [facility] [place]. [facility] Delbert Clam, MD   1 year ago Palpitation   [facility] [facility] [place] [facility] [place]. [facility] Delbert, Clam, MD               topiramate  (TOPAMAX ) 100 MG tablet 90 tablet 1    Sig: Take 1 tablet (100 mg total) by mouth at bedtime.     Neurology: Anticonvulsants - topiramate  & zonisamide Failed - 08/30/2023  2:53 PM      Failed - Cr in normal range and within 360 days    Creatinine, Ser  Date Value Ref Range Status  07/04/2023 1.23 (H) 0.44 - 1.00 mg/dL Final         Passed - CO2 in normal range and within 360 days    CO2  Date Value Ref Range Status  07/04/2023 22 22 - 32 mmol/L Final         Passed - ALT in normal range and within 360 days    ALT  Date Value Ref Range Status  07/04/2023 22 0 - 44 U/L Final         Passed - AST in normal range and within 360 days    AST  Date Value Ref Range Status  07/04/2023 17 15 - 41 U/L Final         Passed - Completed PHQ-2 or PHQ-9 in the last 360 days      Passed - Valid encounter within last 12 months    Recent Outpatient Visits           5 months ago Hilar lymphadenopathy   [facility] [place] [place]. [facility] Delbert Clam, MD   6 months ago Screening for colon cancer   [facility] [place] [facility] Of [place].  [facility] Delbert Clam, MD   1 year ago Screening for colon cancer   [facility] [facility] [place] [facility] [place]. [facility] Delbert Clam, MD   1 year ago Chronic pain of right knee   [place] [facility] [place] [facility] [place]. [facility] Delbert Clam, MD   1 year ago Palpitation   [facility] [facility] [place] [facility] Of [place]. [facility] Delbert, Clam, MD               FLUoxetine  (PROZAC ) 40 MG capsule 90 capsule 0    Sig: Take 1 capsule (40 mg total) by mouth daily.     Psychiatry:  Antidepressants - SSRI Passed - 08/30/2023  2:53 PM      Passed - Valid encounter within last 6 months    Recent Outpatient Visits           5 months ago Hilar lymphadenopathy   [facility] [facility] [place] [facility] [place]. [facility] Delbert Clam, MD   6 months ago Screening for colon cancer   [facility] [facility] [place] [facility] Of [place]. [facility] Delbert Clam, MD   1 year ago Screening for colon cancer   [facility] [facility] [place] [facility] [place]. [facility] Delbert Clam, MD   1 year ago Chronic pain of right knee   [place] [facility] [place] - [facility] Delbert Clam, MD   1 year ago  Palpitation   [place] [facility] [place] Of [place]. [facility] Delbert Clam, MD

## 2023-08-31 ENCOUNTER — Other Ambulatory Visit: Payer: Self-pay

## 2023-08-31 DIAGNOSIS — Z124 Encounter for screening for malignant neoplasm of cervix: Secondary | ICD-10-CM

## 2023-08-31 NOTE — Telephone Encounter (Signed)
 Please feel free to do so.  Thank you.

## 2023-08-31 NOTE — Telephone Encounter (Signed)
 Fecal occult blood test (FIT) kit  reordered. Patient Voiced she misplaced the first kit

## 2023-08-31 NOTE — Telephone Encounter (Signed)
 May I proceed with placing Fecal occult blood test (FIT) kit . Please advise

## 2023-09-12 ENCOUNTER — Ambulatory Visit (HOSPITAL_COMMUNITY): Payer: PRIVATE HEALTH INSURANCE

## 2023-09-13 ENCOUNTER — Encounter: Payer: Self-pay | Admitting: Family Medicine

## 2023-09-13 LAB — FECAL OCCULT BLOOD, IMMUNOCHEMICAL: Fecal Occult Bld: NEGATIVE

## 2023-09-26 ENCOUNTER — Ambulatory Visit (HOSPITAL_COMMUNITY): Admission: RE | Admit: 2023-09-26 | Payer: PRIVATE HEALTH INSURANCE | Source: Ambulatory Visit

## 2023-10-02 ENCOUNTER — Other Ambulatory Visit: Payer: Self-pay

## 2023-10-02 ENCOUNTER — Other Ambulatory Visit: Payer: Self-pay | Admitting: Family Medicine

## 2023-10-02 DIAGNOSIS — Z9189 Other specified personal risk factors, not elsewhere classified: Secondary | ICD-10-CM

## 2023-10-02 DIAGNOSIS — G43709 Chronic migraine without aura, not intractable, without status migrainosus: Secondary | ICD-10-CM

## 2023-10-02 DIAGNOSIS — F419 Anxiety disorder, unspecified: Secondary | ICD-10-CM

## 2023-10-02 MED ORDER — FLUOXETINE HCL 40 MG PO CAPS
40.0000 mg | ORAL_CAPSULE | Freq: Every day | ORAL | 0 refills | Status: DC
  Filled 2023-10-02: qty 30, 30d supply, fill #0

## 2023-10-02 MED ORDER — ATORVASTATIN CALCIUM 40 MG PO TABS
40.0000 mg | ORAL_TABLET | Freq: Every day | ORAL | 0 refills | Status: DC
  Filled 2023-10-02: qty 30, 30d supply, fill #0

## 2023-10-02 MED ORDER — TOPIRAMATE 100 MG PO TABS
100.0000 mg | ORAL_TABLET | Freq: Every day | ORAL | 0 refills | Status: DC
  Filled 2023-10-02: qty 30, 30d supply, fill #0

## 2023-10-10 ENCOUNTER — Ambulatory Visit: Payer: Self-pay | Attending: Physician Assistant | Admitting: Physician Assistant

## 2023-10-10 ENCOUNTER — Encounter: Payer: Self-pay | Admitting: Physician Assistant

## 2023-10-10 ENCOUNTER — Other Ambulatory Visit: Payer: Self-pay

## 2023-10-10 VITALS — BP 127/81 | HR 84 | Wt 283.2 lb

## 2023-10-10 DIAGNOSIS — G8929 Other chronic pain: Secondary | ICD-10-CM

## 2023-10-10 DIAGNOSIS — G43709 Chronic migraine without aura, not intractable, without status migrainosus: Secondary | ICD-10-CM

## 2023-10-10 DIAGNOSIS — Z9189 Other specified personal risk factors, not elsewhere classified: Secondary | ICD-10-CM

## 2023-10-10 DIAGNOSIS — F419 Anxiety disorder, unspecified: Secondary | ICD-10-CM

## 2023-10-10 DIAGNOSIS — I10 Essential (primary) hypertension: Secondary | ICD-10-CM

## 2023-10-10 DIAGNOSIS — G5603 Carpal tunnel syndrome, bilateral upper limbs: Secondary | ICD-10-CM

## 2023-10-10 DIAGNOSIS — M62838 Other muscle spasm: Secondary | ICD-10-CM

## 2023-10-10 DIAGNOSIS — R739 Hyperglycemia, unspecified: Secondary | ICD-10-CM

## 2023-10-10 DIAGNOSIS — R319 Hematuria, unspecified: Secondary | ICD-10-CM

## 2023-10-10 DIAGNOSIS — R3989 Other symptoms and signs involving the genitourinary system: Secondary | ICD-10-CM

## 2023-10-10 DIAGNOSIS — M25561 Pain in right knee: Secondary | ICD-10-CM

## 2023-10-10 LAB — POCT URINALYSIS DIP (CLINITEK)
Bilirubin, UA: NEGATIVE
Glucose, UA: NEGATIVE mg/dL
Ketones, POC UA: NEGATIVE mg/dL
Leukocytes, UA: NEGATIVE
Nitrite, UA: NEGATIVE
POC PROTEIN,UA: NEGATIVE
Spec Grav, UA: 1.02 (ref 1.010–1.025)
Urobilinogen, UA: 1 U/dL
pH, UA: 6 (ref 5.0–8.0)

## 2023-10-10 MED ORDER — METHOCARBAMOL 500 MG PO TABS
1000.0000 mg | ORAL_TABLET | Freq: Three times a day (TID) | ORAL | 1 refills | Status: AC | PRN
  Filled 2023-10-10: qty 90, 15d supply, fill #0

## 2023-10-10 MED ORDER — FLUOXETINE HCL 40 MG PO CAPS
40.0000 mg | ORAL_CAPSULE | Freq: Every day | ORAL | 1 refills | Status: DC
  Filled 2023-10-10: qty 90, 90d supply, fill #0
  Filled 2023-11-01: qty 30, 30d supply, fill #0
  Filled 2023-11-26: qty 30, 30d supply, fill #1
  Filled 2023-12-26: qty 90, 90d supply, fill #2

## 2023-10-10 MED ORDER — TOPIRAMATE 100 MG PO TABS
100.0000 mg | ORAL_TABLET | Freq: Every day | ORAL | 0 refills | Status: DC
  Filled 2023-10-10: qty 90, 90d supply, fill #0
  Filled 2023-11-01: qty 30, 30d supply, fill #0
  Filled 2023-11-26: qty 30, 30d supply, fill #1
  Filled 2023-12-26: qty 30, 30d supply, fill #2

## 2023-10-10 MED ORDER — GABAPENTIN 300 MG PO CAPS
300.0000 mg | ORAL_CAPSULE | Freq: Every day | ORAL | 1 refills | Status: DC
  Filled 2023-10-10 – 2023-11-09 (×2): qty 90, 90d supply, fill #0

## 2023-10-10 MED ORDER — ATORVASTATIN CALCIUM 40 MG PO TABS
40.0000 mg | ORAL_TABLET | Freq: Every day | ORAL | 1 refills | Status: DC
  Filled 2023-10-10: qty 90, 90d supply, fill #0
  Filled 2023-11-01: qty 30, 30d supply, fill #0
  Filled 2023-11-26: qty 30, 30d supply, fill #1
  Filled 2023-12-26: qty 90, 90d supply, fill #2

## 2023-10-10 MED ORDER — AMLODIPINE BESYLATE 5 MG PO TABS
5.0000 mg | ORAL_TABLET | Freq: Every day | ORAL | 1 refills | Status: DC
  Filled 2023-10-10 – 2023-11-26 (×2): qty 90, 90d supply, fill #0
  Filled 2024-02-19 (×2): qty 30, 30d supply, fill #1
  Filled 2024-03-13: qty 30, 30d supply, fill #2
  Filled 2024-04-17: qty 30, 30d supply, fill #3

## 2023-10-10 MED ORDER — MELOXICAM 7.5 MG PO TABS
7.5000 mg | ORAL_TABLET | Freq: Every day | ORAL | 1 refills | Status: AC
  Filled 2023-10-10: qty 90, 90d supply, fill #0
  Filled 2024-01-23: qty 30, 30d supply, fill #0
  Filled 2024-03-03: qty 30, 30d supply, fill #1

## 2023-10-10 MED ORDER — PANTOPRAZOLE SODIUM 40 MG PO TBEC
40.0000 mg | DELAYED_RELEASE_TABLET | Freq: Every day | ORAL | 0 refills | Status: DC
  Filled 2023-10-10: qty 90, 90d supply, fill #0

## 2023-10-10 MED ORDER — HYDROXYZINE HCL 25 MG PO TABS
25.0000 mg | ORAL_TABLET | Freq: Three times a day (TID) | ORAL | 1 refills | Status: AC | PRN
  Filled 2023-10-10 – 2023-12-26 (×2): qty 270, 90d supply, fill #0
  Filled 2024-03-13: qty 90, 30d supply, fill #1
  Filled 2024-05-16: qty 90, 30d supply, fill #2

## 2023-10-10 NOTE — Patient Instructions (Addendum)
 Drink 64 to 80 ounces water daily    Pinched Nerve in the Wrist (Carpal Tunnel Syndrome): What to Know  Pinched nerve in the wrist (carpal tunnel syndrome, or CTS) is a nerve problem that causes pain, numbness, and weakness in the wrist, hand, and fingers. The carpal tunnel is a narrow space that is on the palm side of your wrist. Repeated wrist motions or certain diseases may cause swelling in the tunnel. This swelling can pinch the main nerve in the wrist (the median nerve). What are the causes? CTS may be caused by: Moving your hand and wrist over and over again while doing a task. Hurting the wrist. Arthritis. A pocket of fluid (cyst) or a growth (tumor) in the carpal tunnel. Fluid buildup when you are pregnant. Use of tools that vibrate. In some cases, the cause of CTS is not known. What increases the risk? You're more likely to have CTS if: You have a job that makes you do these things: Move your hand firmly over and over again. Work with tools that vibrate, such as drills or sanders. You're female. You have diabetes, obesity, thyroid problems, or kidney failure. What are the signs or symptoms? Symptoms of this condition include: A tingling feeling in your fingers. You may feel this pain in the thumb, index finger, or middle finger. Tingling or loss of feeling in your hand. Pain in your entire arm. This pain may get worse when you bend your wrist and elbow for a long time. Pain in your wrist that goes up your arm to your shoulder. Pain that goes down into your palm or fingers. Weakness in your hands. You may find it hard to grab and hold items. Your symptoms may feel worse during the night. How is this diagnosed? CTS is diagnosed with a medical history and physical exam. Tests and imaging may also be done to: Check the electrical signals sent by your nerves into the muscles. Check how well electrical signals pass through your nerves. Check possible causes of your CTS. These  include X-rays, ultrasound, and MRI. How is this treated? CTS may be treated with: Lifestyle changes. You will be asked to stop or change the activity that caused your problem. Physical therapy. This may include: Exercises that stretch and strengthen the muscles and tendons in the wrist and hand. Nerve gliding or flossing exercises. These help keep nerves moving smoothly through the tissues around them. Occupational therapy. You'll learn how to use your hand again. Medicines for pain and swelling. You may have injections in your wrist. A wrist splint or brace. Surgery. Follow these instructions at home: If you have a splint or brace: Wear the splint or brace as told. Take it off only if your provider says you can. Check the skin around it every day. Tell your provider if you see problems. Loosen the splint or brace if your fingers tingle, are numb, or turn cold and blue. Keep the splint or brace clean and dry. If the splint or brace isn't waterproof: Do not let it get wet. Cover it when you take a bath or shower. Use a cover that doesn't let any water in. Managing pain, stiffness, and swelling  Use ice or an ice pack as told. If you have a splint or brace that you can take off, remove it only as told. Place a towel between your skin and the ice. Leave the ice on for 20 minutes, 2-3 times a day. If your skin turns red, take off  the ice right away to prevent skin damage. The risk of damage is higher if you can't feel pain, heat, or cold. Move your fingers often to reduce stiffness and swelling. General instructions Take your medicines only as told. Rest your wrist and hand from activity that may cause pain. If your CTS is caused by things you do at work, talk with your employer about making changes. For example, you may need a wrist pad to use while typing. Exercise as told. Follow instructions on how to do nerve gliding or flossing exercises. These help keep nerves in moving smoothly  through the tissues around them. Keep all follow-up visits. This is important. Where to find more information American Academy of Orthopedic Surgeons: orthoinfo.aaos.[employer] of Neurological Disorders and Stroke: BasicFM.no Contact a health care provider if: You have new symptoms. Your pain is not controlled with medicines. Your symptoms get worse. Get help right away if: Your hand or wrist tingles or is numb, and the symptoms become very bad. This information is not intended to replace advice given to you by your health care provider. Make sure you discuss any questions you have with your health care provider. Document Revised: 05/22/2023 Document Reviewed: 03/09/2023 Elsevier Patient Education  2024 [employer].

## 2023-10-10 NOTE — Progress Notes (Signed)
 Patient ID: Monique Tyler, female   DOB: 03/14/1966, 58 y.o.   MRN: [id number]    Monique Tyler, is a 58 y.o. female  NWG:[id number]  [id number]:[id number]  DOB - [birth year]  Chief Complaint  Patient presents with   Medication Management    Patient also stated that sometime on the left shoulder she loses range of motion and pain    Urinary Tract Infection       Subjective:   Monique Tyler is a 58 y.o. female here today for medication RF.  Last labs in December.  Since then, she had a kidney stone.  She still occasionally has urinary frequency but no dysuria and has not had hematuria. She had a UTI in December.   She is still having R knee pain but has not lost weight as was recommended to have surgery.    L trapezius pain for 2-3 months.  Has B carpal syndrome and L seems a little worse lately.  Does not sleep in wrist splints.    She says she will be following up with pulmonology soon.    Stable on hydroxyzine and prozac for anxiety and depression.   No problems updated.  ALLERGIES: Allergies  Allergen Reactions   Shellfish Allergy Swelling   Pork-Derived Products Nausea And Vomiting    PAST MEDICAL HISTORY: Past Medical History:  Diagnosis Date   Asthma    GERD (gastroesophageal reflux disease)    Hyperlipidemia    Hypertension    pt denies a hx of HTN   Kidney stones    Lung nodules    Migraine    Vertigo     MEDICATIONS AT HOME: Prior to Admission medications   Medication Sig Start Date End Date Taking? Authorizing Provider  acetaminophen (TYLENOL) 500 MG tablet Take 500 mg by mouth every 6 (six) hours as needed.   Yes [provider]  albuterol (VENTOLIN HFA) 108 (90 Base) MCG/ACT inhaler Inhale 2 puffs into the lungs every 4 (four) hours as needed for wheezing or shortness of breath. 11/30/20  Yes Hoy Register, MD  alum & mag hydroxide-simeth (MAALOX MAX) 400-400-40 MG/5ML suspension Take 10 mLs by mouth 3 (three) times daily as needed for indigestion.  02/26/23  Yes Marcelino Duster, MD  Ascorbic Acid (VITAMIN C) 1000 MG tablet Take 1 tablet (1,000 mg total) by mouth daily. 08/30/22  Yes Hoy Register, MD  cetirizine (ZYRTEC) 10 MG tablet Take 1 tablet (10 mg total) by mouth daily. 11/03/19  Yes Hoy Register, MD  Cholecalciferol (VITAMIN D) 50 MCG (2000 UT) CAPS Take 1 capsule (2,000 Units total) by mouth daily. 03/16/21  Yes Breezie Micucci M, PA-C  fluticasone (FLONASE) 50 MCG/ACT nasal spray Place 2 sprays into both nostrils daily. 11/03/19  Yes Hoy Register, MD  methocarbamol (ROBAXIN) 500 MG tablet Take 2 tablets (1,000 mg total) by mouth every 8 (eight) hours as needed. 10/10/23  Yes Roxanne Orner, Marzella Schlein, PA-C  traMADol (ULTRAM) 50 MG tablet Take 1-2 tablets (50-100 mg total) by mouth every 6 (six) hours as needed for moderate pain (pain score 4-6). 07/04/23  Yes Crist Fat, MD  amLODipine (NORVASC) 5 MG tablet Take 1 tablet (5 mg total) by mouth daily. 10/10/23   Anders Simmonds, PA-C  atorvastatin (LIPITOR) 40 MG tablet Take 1 tablet (40 mg total) by mouth daily. Please make appt with Dr. Alvis Lemmings. 10/10/23   Anders Simmonds, PA-C  FLUoxetine (PROZAC) 40 MG capsule Take 1 capsule (40 mg total) by mouth daily. 10/10/23  Georgian Co M, PA-C  gabapentin (NEURONTIN) 300 MG capsule Take 1 capsule (300 mg total) by mouth at bedtime. 10/10/23   Anders Simmonds, PA-C  hydrOXYzine (ATARAX) 25 MG tablet Take 1 tablet (25 mg total) by mouth 3 (three) times daily as needed (Increase night time dose 50 mg). 10/10/23   Anders Simmonds, PA-C  meloxicam (MOBIC) 7.5 MG tablet Take 1 tablet (7.5 mg total) by mouth daily. 10/10/23   Anders Simmonds, PA-C  pantoprazole (PROTONIX) 40 MG tablet Take 1 tablet (40 mg total) by mouth daily. 10/10/23   Anders Simmonds, PA-C  topiramate (TOPAMAX) 100 MG tablet Take 1 tablet (100 mg total) by mouth at bedtime. 10/10/23   Monique Tyler, Marzella Schlein, PA-C    ROS: Neg HEENT Neg resp Neg cardiac Neg  psych Neg neuro  Objective:   Vitals:   10/10/23 0943  BP: 127/81  Pulse: 84  SpO2: 94%  Weight: 283 lb 3.2 oz (128.5 kg)   Exam General appearance : Awake, alert, not in any distress. Speech Clear. Not toxic looking HEENT: Atraumatic and Normocephalic Neck: Supple, no JVD. No cervical lymphadenopathy.  Chest: Good air entry bilaterally, CTAB.  No rales/rhonchi/wheezing CVS: S1 S2 regular, no murmurs.  Abdomen: Bowel sounds present, Non tender and not distended with no gaurding, rigidity or rebound. Extremities: B/L Lower Ext shows no edema, both legs are warm to touch L trapezius spasm.  BUE DTR=intact B.   Neurology: Awake alert, and oriented X 3, CN II-XII intact, Non focal Skin: No Rash  Data Review Lab Results  Component Value Date   HGBA1C 5.5 02/23/2023   HGBA1C 5.5 02/15/2023   HGBA1C 5.7 (H) 08/17/2022    Assessment & Plan   1. Suspected UTI (Primary) UA with trace hematuria and no infection.  Passed kidney stone a couple months ago.  Increase water intake.   - POCT URINALYSIS DIP (CLINITEK)  2. Chronic migraine without aura without status migrainosus, not intractable Stable on meds - topiramate (TOPAMAX) 100 MG tablet; Take 1 tablet (100 mg total) by mouth at bedtime.  Dispense: 90 tablet; Refill: 0  3. Anxiety Stable on meds - FLUoxetine (PROZAC) 40 MG capsule; Take 1 capsule (40 mg total) by mouth daily.  Dispense: 90 capsule; Refill: 1 - hydrOXYzine (ATARAX) 25 MG tablet; Take 1 tablet (25 mg total) by mouth 3 (three) times daily as needed (Increase night time dose 50 mg).  Dispense: 270 tablet; Refill: 1  4. At risk for cardiovascular event - atorvastatin (LIPITOR) 40 MG tablet; Take 1 tablet (40 mg total) by mouth daily. Please make appt with Dr. Alvis Lemmings.  Dispense: 90 tablet; Refill: 1  5. Essential hypertension controlled - amLODipine (NORVASC) 5 MG tablet; Take 1 tablet (5 mg total) by mouth daily.  Dispense: 90 tablet; Refill: 1  6. Chronic pain  of right knee Follow up with orhto - meloxicam (MOBIC) 7.5 MG tablet; Take 1 tablet (7.5 mg total) by mouth daily.  Dispense: 90 tablet; Refill: 1  7. Bilateral carpal tunnel syndrome Wrist splints at night time - gabapentin (NEURONTIN) 300 MG capsule; Take 1 capsule (300 mg total) by mouth at bedtime.  Dispense: 90 capsule; Refill: 1  8. Muscle spasm - methocarbamol (ROBAXIN) 500 MG tablet; Take 2 tablets (1,000 mg total) by mouth every 8 (eight) hours as needed.  Dispense: 90 tablet; Refill: 1  9. Hematuria, unspecified type Increase water intake.  Will refer if does not resolve.  Recent stone  10. Hyperglycemia  work at a goal of eliminating sugary drinks, candy, desserts, sweets, refined sugars, processed foods, and white carbohydrates.  - Hemoglobin A1c    Return in about 3 months (around 01/10/2024) for PCP for chronic conditions.  The patient was given clear instructions to go to ER or return to medical center if symptoms don't improve, worsen or new problems develop. The patient verbalized understanding. The patient was told to call to get lab results if they haven't heard anything in the next week.      Georgian Co, PA-C [facility] [place], [place] [phone]   10/10/2023, 10:37 AM

## 2023-10-11 LAB — HEMOGLOBIN A1C
Est. average glucose Bld gHb Est-mCnc: 111 mg/dL
Hgb A1c MFr Bld: 5.5 % (ref 4.8–5.6)

## 2023-10-22 ENCOUNTER — Other Ambulatory Visit: Payer: Self-pay

## 2023-11-01 ENCOUNTER — Other Ambulatory Visit: Payer: Self-pay

## 2023-11-05 ENCOUNTER — Ambulatory Visit (HOSPITAL_COMMUNITY): Payer: Self-pay

## 2023-11-09 ENCOUNTER — Other Ambulatory Visit: Payer: Self-pay

## 2023-11-13 ENCOUNTER — Other Ambulatory Visit: Payer: Self-pay

## 2023-11-13 ENCOUNTER — Ambulatory Visit (HOSPITAL_COMMUNITY): Admission: RE | Admit: 2023-11-13 | Payer: Self-pay | Source: Ambulatory Visit

## 2023-11-26 ENCOUNTER — Other Ambulatory Visit: Payer: Self-pay

## 2023-12-13 ENCOUNTER — Emergency Department (HOSPITAL_BASED_OUTPATIENT_CLINIC_OR_DEPARTMENT_OTHER): Payer: Self-pay

## 2023-12-13 ENCOUNTER — Other Ambulatory Visit: Payer: Self-pay

## 2023-12-13 ENCOUNTER — Encounter (HOSPITAL_BASED_OUTPATIENT_CLINIC_OR_DEPARTMENT_OTHER): Payer: Self-pay

## 2023-12-13 ENCOUNTER — Emergency Department (HOSPITAL_BASED_OUTPATIENT_CLINIC_OR_DEPARTMENT_OTHER)
Admission: EM | Admit: 2023-12-13 | Discharge: 2023-12-13 | Disposition: A | Discharge status: Home / Self Care | Payer: Self-pay | Attending: Emergency Medicine | Admitting: Emergency Medicine

## 2023-12-13 DIAGNOSIS — J45909 Unspecified asthma, uncomplicated: Secondary | ICD-10-CM | POA: Insufficient documentation

## 2023-12-13 DIAGNOSIS — I10 Essential (primary) hypertension: Secondary | ICD-10-CM | POA: Insufficient documentation

## 2023-12-13 DIAGNOSIS — R42 Dizziness and giddiness: Secondary | ICD-10-CM | POA: Insufficient documentation

## 2023-12-13 DIAGNOSIS — I2510 Atherosclerotic heart disease of native coronary artery without angina pectoris: Secondary | ICD-10-CM | POA: Insufficient documentation

## 2023-12-13 DIAGNOSIS — R519 Headache, unspecified: Secondary | ICD-10-CM | POA: Insufficient documentation

## 2023-12-13 DIAGNOSIS — Z79899 Other long term (current) drug therapy: Secondary | ICD-10-CM | POA: Insufficient documentation

## 2023-12-13 DIAGNOSIS — R202 Paresthesia of skin: Secondary | ICD-10-CM | POA: Insufficient documentation

## 2023-12-13 LAB — URINALYSIS, ROUTINE W REFLEX MICROSCOPIC
Bacteria, UA: NONE SEEN
Bilirubin Urine: NEGATIVE
Glucose, UA: NEGATIVE mg/dL
Ketones, ur: NEGATIVE mg/dL
Nitrite: NEGATIVE
Protein, ur: NEGATIVE mg/dL
Specific Gravity, Urine: 1.02 (ref 1.005–1.030)
pH: 6.5 (ref 5.0–8.0)

## 2023-12-13 LAB — CBG MONITORING, ED: Glucose-Capillary: 109 mg/dL — ABNORMAL HIGH (ref 70–99)

## 2023-12-13 LAB — CBC
HCT: 41.3 % (ref 36.0–46.0)
Hemoglobin: 12.7 g/dL (ref 12.0–15.0)
MCH: 26.6 pg (ref 26.0–34.0)
MCHC: 30.8 g/dL (ref 30.0–36.0)
MCV: 86.6 fL (ref 80.0–100.0)
Platelets: 298 10*3/uL (ref 150–400)
RBC: 4.77 MIL/uL (ref 3.87–5.11)
RDW: 14.5 % (ref 11.5–15.5)
WBC: 5.3 10*3/uL (ref 4.0–10.5)
nRBC: 0 % (ref 0.0–0.2)

## 2023-12-13 LAB — COMPREHENSIVE METABOLIC PANEL WITH GFR
ALT: 27 U/L (ref 0–44)
AST: 32 U/L (ref 15–41)
Albumin: 4.1 g/dL (ref 3.5–5.0)
Alkaline Phosphatase: 184 U/L — ABNORMAL HIGH (ref 38–126)
Anion gap: 13 (ref 5–15)
BUN: 12 mg/dL (ref 6–20)
CO2: 21 mmol/L — ABNORMAL LOW (ref 22–32)
Calcium: 9.5 mg/dL (ref 8.9–10.3)
Chloride: 107 mmol/L (ref 98–111)
Creatinine, Ser: 1.11 mg/dL — ABNORMAL HIGH (ref 0.44–1.00)
GFR, Estimated: 57 mL/min — ABNORMAL LOW (ref >60)
Glucose, Bld: 103 mg/dL — ABNORMAL HIGH (ref 70–99)
Potassium: 3.5 mmol/L (ref 3.5–5.1)
Sodium: 142 mmol/L (ref 135–145)
Total Bilirubin: 0.3 mg/dL (ref 0.0–1.2)
Total Protein: 6.8 g/dL (ref 6.5–8.1)

## 2023-12-13 NOTE — ED Notes (Signed)
 Pt given discharge instructions. Opportunities given for questions. Pt verbalizes understanding. Jillyn Hidden, RN

## 2023-12-13 NOTE — Discharge Instructions (Addendum)
 You were seen today for face tingling.  Your labs, imaging, physical evaluation and a low suspicion for any emergent condition causing symptoms at this time.  Believe this is most likely secondary due to migraine or anxiety as a cause recent was today.  Recommend continued follow-up with your PCP for further evaluation if the symptoms persist, return to the ED for any new or worsening symptoms.  Return if you begin to have any fever, visual changes with headache, vomiting, one-sided weakness, chest pain, shortness of breath.

## 2023-12-13 NOTE — ED Triage Notes (Signed)
 Patient arrives ambulatory to the ED with complaints of tingling/numbness all over. Patient states that symptoms started around noon today.

## 2023-12-13 NOTE — ED Provider Notes (Signed)
 [facility] AT [facility] Provider Note   CSN: [id number] Arrival date & time: 12/13/23  1245     History Chief Complaint  Patient presents with   Dizziness   Tingling    Monique Tyler is a 58 y.o. female.   Dizziness Patient is a 58 year old female presents ED today with complaints of facial numbness and tingling that has spread to both upper arms, across chest that started this morning.  Previous medical history of migraines, BPPV, CAD, HLD, HTN. Endorses headache.  States that she has been under a lot more stress recently.  Has not felt this way before. No new medication changes.  Reports hydrating appropriately.  Denies injury, fever, vision changes, chest pain, shortness of breath, abdominal pain, nausea, vomiting, diarrhea, dysuria, unilateral weakness, lower leg swelling.    Home Medications Prior to Admission medications   Medication Sig Start Date End Date Taking? Authorizing Provider  acetaminophen  (TYLENOL ) 500 MG tablet Take 500 mg by mouth every 6 (six) hours as needed.    [provider]  albuterol  (VENTOLIN  HFA) 108 (90 Base) MCG/ACT inhaler Inhale 2 puffs into the lungs every 4 (four) hours as needed for wheezing or shortness of breath. 11/30/20   Newlin, Enobong, MD  alum & mag hydroxide-simeth (MAALOX MAX) 400-400-40 MG/5ML suspension Take 10 mLs by mouth 3 (three) times daily as needed for indigestion. 02/26/23   Aisha Hove, MD  amLODipine  (NORVASC ) 5 MG tablet Take 1 tablet (5 mg total) by mouth daily. 10/10/23   Hassie Lint, PA-C  Ascorbic Acid  (VITAMIN C ) 1000 MG tablet Take 1 tablet (1,000 mg total) by mouth daily. 08/30/22   Newlin, Enobong, MD  atorvastatin  (LIPITOR) 40 MG tablet Take 1 tablet (40 mg total) by mouth daily. Please make appt with Dr. Newlin. 10/10/23   Hassie Lint, PA-C  cetirizine  (ZYRTEC ) 10 MG tablet Take 1 tablet (10 mg total) by mouth daily. 11/03/19   Newlin, Enobong, MD   Cholecalciferol  (VITAMIN D ) 50 MCG (2000 UT) CAPS Take 1 capsule (2,000 Units total) by mouth daily. 03/16/21   Hassie Lint, PA-C  FLUoxetine  (PROZAC ) 40 MG capsule Take 1 capsule (40 mg total) by mouth daily. 10/10/23   Hassie Lint, PA-C  fluticasone  (FLONASE ) 50 MCG/ACT nasal spray Place 2 sprays into both nostrils daily. 11/03/19   Newlin, Enobong, MD  gabapentin  (NEURONTIN ) 300 MG capsule Take 1 capsule (300 mg total) by mouth at bedtime. 10/10/23   McClung, Angela M, PA-C  hydrOXYzine  (ATARAX ) 25 MG tablet Take 1 tablet (25 mg total) by mouth 3 (three) times daily as needed (Increase night time dose 50 mg). 10/10/23   Hassie Lint, PA-C  meloxicam  (MOBIC ) 7.5 MG tablet Take 1 tablet (7.5 mg total) by mouth daily. 10/10/23   McClung, Angela M, PA-C  methocarbamol  (ROBAXIN ) 500 MG tablet Take 2 tablets (1,000 mg total) by mouth every 8 (eight) hours as needed. 10/10/23   Hassie Lint, PA-C  pantoprazole  (PROTONIX ) 40 MG tablet Take 1 tablet (40 mg total) by mouth daily. 10/10/23   Hassie Lint, PA-C  topiramate  (TOPAMAX ) 100 MG tablet Take 1 tablet (100 mg total) by mouth at bedtime. 10/10/23   Hassie Lint, PA-C  traMADol  (ULTRAM ) 50 MG tablet Take 1-2 tablets (50-100 mg total) by mouth every 6 (six) hours as needed for moderate pain (pain score 4-6). 07/04/23   Andrez Banker, MD      Allergies    Shellfish allergy  and Pork-derived products    Review of Systems   Review of Systems  Neurological:  Positive for dizziness.  All other systems reviewed and are negative.   Physical Exam Updated Vital Signs BP 135/69   Pulse 61   Temp 98.6 F (37 C)   Resp 18   Ht 5\' 7"  (1.702 m)   Wt 122.5 kg   LMP 01/18/2012   SpO2 96%   BMI 42.29 kg/m  Physical Exam Vitals and nursing note reviewed.  Constitutional:      General: She is not in acute distress.    Appearance: Normal appearance. She is not ill-appearing.  HENT:     Head: Normocephalic and  atraumatic.  Eyes:     General: No scleral icterus.       Right eye: No discharge.        Left eye: No discharge.     Extraocular Movements: Extraocular movements intact.     Conjunctiva/sclera: Conjunctivae normal.  Cardiovascular:     Rate and Rhythm: Normal rate and regular rhythm.     Pulses: Normal pulses.     Heart sounds: Normal heart sounds. No murmur heard.    No friction rub. No gallop.  Pulmonary:     Effort: Pulmonary effort is normal. No respiratory distress.     Breath sounds: Normal breath sounds. No stridor. No wheezing, rhonchi or rales.  Abdominal:     General: Abdomen is flat. There is no distension.     Palpations: Abdomen is soft.     Tenderness: There is no abdominal tenderness. There is no right CVA tenderness, left CVA tenderness or guarding.  Musculoskeletal:     Cervical back: Normal range of motion and neck supple. No rigidity or tenderness.     Right lower leg: No edema.     Left lower leg: No edema.  Skin:    General: Skin is warm and dry.     Findings: No bruising, erythema or lesion.  Neurological:     General: No focal deficit present.     Mental Status: She is alert and oriented to person, place, and time. Mental status is at baseline.     Cranial Nerves: No cranial nerve deficit.     Sensory: No sensory deficit.     Motor: No weakness.     Coordination: Coordination normal.     Gait: Gait normal.     Comments: No facial asymmetry, no ataxia, no apraxia, no aphasia, no arm drift, normal coordination with finger-to-nose, normal sensation to both upper and lower extremities bilaterally, normal grip strength bilaterally, normal strength to both flexion and extension to both upper lower extremities 5+ bilaterally, no visual field deficits, no nystagmus.   Psychiatric:        Mood and Affect: Mood normal.     ED Results / Procedures / Treatments   Labs (all labs ordered are listed, but only abnormal results are displayed) Labs Reviewed   COMPREHENSIVE METABOLIC PANEL WITH GFR - Abnormal; Notable for the following components:      Result Value   CO2 21 (*)    Glucose, Bld 103 (*)    Creatinine, Ser 1.11 (*)    Alkaline Phosphatase 184 (*)    GFR, Estimated 57 (*)    All other components within normal limits  URINALYSIS, ROUTINE W REFLEX MICROSCOPIC - Abnormal; Notable for the following components:   APPearance HAZY (*)    Hgb urine dipstick MODERATE (*)    Leukocytes,Ua TRACE (*)  All other components within normal limits  CBG MONITORING, ED - Abnormal; Notable for the following components:   Glucose-Capillary 109 (*)    All other components within normal limits  CBC    EKG EKG Interpretation Date/Time:  Thursday Dec 13 2023 13:21:04 EDT Ventricular Rate:  65 PR Interval:  178 QRS Duration:  98 QT Interval:  448 QTC Calculation: 465 R Axis:   6  Text Interpretation: Normal sinus rhythm Low voltage QRS Incomplete right bundle branch block Borderline ECG When compared with ECG of 22-Feb-2023 18:13, No significant change since last tracing Confirmed by Racheal Buddle [phone]) on 12/13/2023 4:32:20 PM  Radiology CT Head Wo Contrast Result Date: 12/13/2023 CLINICAL DATA:  Headache, increasing frequency or severity Headache, new onset (Age >= 51y) EXAM: CT HEAD WITHOUT CONTRAST TECHNIQUE: Contiguous axial images were obtained from the base of the skull through the vertex without intravenous contrast. RADIATION DOSE REDUCTION: This exam was performed according to the departmental dose-optimization program which includes automated exposure control, adjustment of the mA and/or kV according to patient size and/or use of iterative reconstruction technique. COMPARISON:  Head CT 05/08/2017 FINDINGS: Brain: No intracranial hemorrhage, mass effect, or midline shift. No hydrocephalus. The basilar cisterns are patent. No evidence of territorial infarct or acute ischemia. No extra-axial or intracranial fluid collection. Vascular: No  hyperdense vessel or unexpected calcification. Skull: No fracture or focal lesion. Sinuses/Orbits: Paranasal sinuses and mastoid air cells are clear. The visualized orbits are unremarkable. Other: None. IMPRESSION: Normal head CT. Electronically Signed   By: Chadwick Colonel M.D.   On: 12/13/2023 17:28    Procedures Procedures    Medications Ordered in ED Medications - No data to display  ED Course/ Medical Decision Making/ A&P                                 Medical Decision Making Amount and/or Complexity of Data Reviewed Labs: ordered. Radiology: ordered.   This patient is a 58 year old female who presents to the ED for concern of headache with associated numbness and tingling across face, both upper extremities.  Started this morning.  Not complaining of any pain or weakness.  On physical exam, patient is in no acute distress, afebrile, alert and orient x 4, speaking in full sentences, nontachypneic, nontachycardic.  No tenderness over temples, sinuses.  LCTAB, no murmurs.  No abdominal tenderness.  No CVA tenderness.  She had normal neuroexam.  Unremarkable physical exam.  CMP seems close to baseline outside of an elevated alk phos of 184.  UA shows moderate hemoglobin but otherwise unremarkable.  CBC unremarkable.  Discussed that both patient's vague symptoms, I have low suspicion for any emergent causes for symptoms however patient did wishes to have a CT due to the headache and the tingling sensation that has been experiencing.  CT was reassuring and unremarkable.  With patient symptoms, suspect not emergent causes of symptoms today.  Will recommend she follows up with PCP for further evaluation at this time.  Return to ED for new or worsening symptoms.  Patient vital signs have remained stable throughout the course of patient's time in the ED. Low suspicion for any other emergent pathology at this time. I believe this patient is safe to be discharged. Provided strict return to ER  precautions. Patient expressed agreement and understanding of plan. All questions were answered.  Differential diagnoses prior to evaluation: The emergent differential diagnosis includes, but is not  limited to, migraine, anxiety, Bell's palsy, multiple sclerosis, trigeminal neuralgia, mass, CVA, herpes zoster. This is not an exhaustive differential.   Past Medical History / Co-morbidities / Social History: Migraine, BPPV, OSA, HTN, HLD, GAD, nephrolithiasis, lung nodules, asthma  Additional history: Chart reviewed. Pertinent results include: Hospitalized for UTI and discharged on 07/04/2019 for, also seen for suspected UTI on 10/10/2023 but noted just to have hemoglobin in urine likely secondary to recent nephrolithiasis.  Lab Tests/Imaging studies: I personally interpreted labs/imaging and the pertinent results include:    CBC unremarkable UA unremarkable CMP shows elevated creatinine and decreased GFR.,  Similar to previous.  Does show increase alk phos of 184 increased from previous 5 months ago 146 CT unremarkable  I agree with the radiologist interpretation.  Cardiac monitoring: EKG obtained and interpreted by myself and attending physician which shows: NSR   Medications:  I have reviewed the patients home medicines and have made adjustments as needed.  Disposition: After consideration of the diagnostic results and the patients response to treatment, I feel that the patient would benefit from discharge treatment as above.   emergency department workup does not suggest an emergent condition requiring admission or immediate intervention beyond what has been performed at this time. The plan is: Pain control with symptomatic management at home, continue monitor for symptoms, return for new or worsening symptoms, follow-up with PCP for persistent symptoms.. The patient is safe for discharge and has been instructed to return immediately for worsening symptoms, change in symptoms or any  other concerns.   Final Clinical Impression(s) / ED Diagnoses Final diagnoses:  Tingling of face    Rx / DC Orders ED Discharge Orders     None         Vevelyn Gowers 12/13/23 1738    Deatra Face, MD 12/14/23 1456

## 2023-12-26 ENCOUNTER — Other Ambulatory Visit: Payer: Self-pay

## 2023-12-26 ENCOUNTER — Other Ambulatory Visit: Payer: Self-pay | Admitting: Family Medicine

## 2023-12-26 DIAGNOSIS — J3089 Other allergic rhinitis: Secondary | ICD-10-CM

## 2023-12-26 MED ORDER — ALBUTEROL SULFATE HFA 108 (90 BASE) MCG/ACT IN AERS
2.0000 | INHALATION_SPRAY | RESPIRATORY_TRACT | 1 refills | Status: DC | PRN
  Filled 2023-12-26: qty 18, 16d supply, fill #0
  Filled 2024-01-10: qty 6.7, 17d supply, fill #0
  Filled 2024-03-03: qty 6.7, 17d supply, fill #1

## 2023-12-26 MED ORDER — BENZONATATE 100 MG PO CAPS
100.0000 mg | ORAL_CAPSULE | Freq: Three times a day (TID) | ORAL | 0 refills | Status: DC
  Filled 2023-12-26 – 2024-01-10 (×2): qty 21, 7d supply, fill #0

## 2023-12-27 ENCOUNTER — Other Ambulatory Visit: Payer: Self-pay

## 2024-01-07 ENCOUNTER — Other Ambulatory Visit: Payer: Self-pay

## 2024-01-10 ENCOUNTER — Other Ambulatory Visit: Payer: Self-pay

## 2024-01-10 ENCOUNTER — Ambulatory Visit: Payer: Self-pay | Attending: Family Medicine | Admitting: Family Medicine

## 2024-01-10 ENCOUNTER — Encounter: Payer: Self-pay | Admitting: Family Medicine

## 2024-01-10 ENCOUNTER — Other Ambulatory Visit (HOSPITAL_COMMUNITY)
Admission: RE | Admit: 2024-01-10 | Discharge: 2024-01-10 | Disposition: A | Discharge status: Home / Self Care | Payer: Self-pay | Source: Ambulatory Visit | Attending: Family Medicine | Admitting: Family Medicine

## 2024-01-10 VITALS — BP 119/79 | HR 68 | Ht 67.0 in | Wt 280.2 lb

## 2024-01-10 DIAGNOSIS — Z9189 Other specified personal risk factors, not elsewhere classified: Secondary | ICD-10-CM

## 2024-01-10 DIAGNOSIS — R399 Unspecified symptoms and signs involving the genitourinary system: Secondary | ICD-10-CM

## 2024-01-10 DIAGNOSIS — F419 Anxiety disorder, unspecified: Secondary | ICD-10-CM

## 2024-01-10 DIAGNOSIS — R052 Subacute cough: Secondary | ICD-10-CM

## 2024-01-10 DIAGNOSIS — K219 Gastro-esophageal reflux disease without esophagitis: Secondary | ICD-10-CM | POA: Insufficient documentation

## 2024-01-10 DIAGNOSIS — I10 Essential (primary) hypertension: Secondary | ICD-10-CM

## 2024-01-10 DIAGNOSIS — G5603 Carpal tunnel syndrome, bilateral upper limbs: Secondary | ICD-10-CM | POA: Insufficient documentation

## 2024-01-10 LAB — POCT URINALYSIS DIP (CLINITEK)
Bilirubin, UA: NEGATIVE
Glucose, UA: NEGATIVE mg/dL
Ketones, POC UA: NEGATIVE mg/dL
Leukocytes, UA: NEGATIVE
Nitrite, UA: NEGATIVE
POC PROTEIN,UA: NEGATIVE
Spec Grav, UA: 1.015 (ref 1.010–1.025)
Urobilinogen, UA: 0.2 U/dL
pH, UA: 7 (ref 5.0–8.0)

## 2024-01-10 MED ORDER — PANTOPRAZOLE SODIUM 40 MG PO TBEC
40.0000 mg | DELAYED_RELEASE_TABLET | Freq: Every day | ORAL | 1 refills | Status: DC
  Filled 2024-01-10 (×2): qty 30, 30d supply, fill #0
  Filled 2024-02-12: qty 30, 30d supply, fill #1
  Filled 2024-03-13: qty 30, 30d supply, fill #2
  Filled 2024-04-17: qty 30, 30d supply, fill #3
  Filled 2024-05-16: qty 30, 30d supply, fill #4
  Filled 2024-06-13: qty 30, 30d supply, fill #5

## 2024-01-10 MED ORDER — FAMOTIDINE 20 MG PO TABS
20.0000 mg | ORAL_TABLET | Freq: Every day | ORAL | 1 refills | Status: DC
  Filled 2024-01-10 (×2): qty 30, 30d supply, fill #0
  Filled 2024-02-12: qty 30, 30d supply, fill #1
  Filled 2024-03-13: qty 30, 30d supply, fill #2
  Filled 2024-04-17: qty 30, 30d supply, fill #3
  Filled 2024-05-16: qty 30, 30d supply, fill #4
  Filled 2024-06-13: qty 30, 30d supply, fill #5

## 2024-01-10 MED ORDER — FLUOXETINE HCL 40 MG PO CAPS
40.0000 mg | ORAL_CAPSULE | Freq: Every day | ORAL | 1 refills | Status: DC
  Filled 2024-01-10 – 2024-03-13 (×2): qty 90, 90d supply, fill #0
  Filled 2024-05-16: qty 90, 90d supply, fill #1

## 2024-01-10 MED ORDER — GABAPENTIN 300 MG PO CAPS
300.0000 mg | ORAL_CAPSULE | Freq: Every day | ORAL | 1 refills | Status: DC
  Filled 2024-01-10: qty 90, 90d supply, fill #0
  Filled 2024-02-12: qty 30, 30d supply, fill #0
  Filled 2024-03-13: qty 30, 30d supply, fill #1
  Filled 2024-04-17: qty 30, 30d supply, fill #2
  Filled 2024-05-16: qty 30, 30d supply, fill #3
  Filled 2024-06-13: qty 30, 30d supply, fill #4

## 2024-01-10 MED ORDER — ATORVASTATIN CALCIUM 40 MG PO TABS
40.0000 mg | ORAL_TABLET | Freq: Every day | ORAL | 1 refills | Status: DC
  Filled 2024-01-10 – 2024-03-13 (×2): qty 90, 90d supply, fill #0
  Filled 2024-05-16: qty 90, 90d supply, fill #1

## 2024-01-10 NOTE — Patient Instructions (Signed)
 VISIT SUMMARY:  During today's visit, we discussed your persistent cough, urinary symptoms, and worsening acid reflux. We also reviewed your current medications and general health maintenance needs.  YOUR PLAN:  -COUGH: Your persistent dry cough, especially at night and with physical activity, may be related to obstructive sleep apnea or sarcoidosis. Please use the prescribed cough medication and inhaler. If your symptoms do not improve, consider taking prednisone  5 mg. It is important to schedule and complete a CT scan of your chest and follow up with your pulmonologist.  -URINARY SYMPTOMS: You are experiencing frequent urination and a sensation of incomplete recovery from previous kidney stones, along with hematuria. We will perform a vaginal swab to check for bacterial vaginosis due to the abnormal smell. Please increase your fluid intake, including cranberry juice, and follow up if your symptoms persist.  -GASTROESOPHAGEAL REFLUX DISEASE (GERD): Your acid reflux symptoms have worsened with certain foods. Continue taking pantoprazole  40 mg daily and add Pepcid 20 mg at night. You can also use Tums as needed before consuming foods that trigger your symptoms.  -GENERAL HEALTH MAINTENANCE: Your blood pressure is well controlled with your current medications. Please resolve any insurance issues to schedule your mammogram. Continue taking your current medications: amlodipine , atorvastatin , fluoxetine , gabapentin , and pantoprazole .  INSTRUCTIONS:  Please complete the CT scan of your chest and follow up with your pulmonologist before your cruise from July 9th to 15th. Contact our office if your symptoms persist after using the inhaler and cough medication.

## 2024-01-10 NOTE — Progress Notes (Signed)
 Subjective:  Patient ID: Monique Tyler, female    DOB: [birth year]  Age: 58 y.o. MRN: [id number]  CC: Medical Management of Chronic Issues (Cough for 2 weeks/Frequent urination/foul smelling)     Discussed the use of AI scribe software for clinical note transcription with the patient, who gave verbal consent to proceed.  History of Present Illness Monique Tyler is a 58 year old female with a history of chronic migraines, anxiety, right shoulder pain status post rotator cuff surgery in 07/2016, seasonal allergies, OSA (uses CPAP at night), migraines who presents with a persistent cough and urinary symptoms.  She experiences a severe dry cough, particularly at night, which disrupts her sleep and worsens with physical activity. Wheezing occurs when walking briskly. She has not picked up the prescribed Pills and Albuterol  MDI Sent to Her Pharmacy. A CT scan of the chest was ordered by her pulmonologist which she is yet to undergo as this was missed due to weather. She engages in walking for exercise but notes increased breathing difficulties, including wheezing, since walking more frequently with her dog.  Previous CTA chest from 02/2023 revealed: IMPRESSION: 1. No evidence of pulmonary embolism. 2. Interval increase in size of mediastinal and hilar lymphadenopathy as described above. Multiple mildly enlarged upper abdominal lymph nodes as well. Findings are nonspecific and differential diagnosis includes sarcoidosis, malignancy, etc. Further evaluation with PET-CT scan and tissue sampling is recommended.  Urinary symptoms include frequent urination and a 'sensation of incomplete recovery from previous kidney stones'. She recalls hematuria during a recent hospital visit.  Current medications are amlodipine , atorvastatin , Prozac , gabapentin , and pantoprazole . Acid reflux symptoms have worsened, requiring dietary modifications. Stomach upset occurs with ice cream and milk, though whole milk from  [employer] is tolerated.  She endorses adherence with her omeprazole .  She engages in walking for exercise but notes increased breathing difficulties, including wheezing, since walking more frequently with her dog.    Past Medical History:  Diagnosis Date   Asthma    GERD (gastroesophageal reflux disease)    Hyperlipidemia    Hypertension    pt denies a hx of HTN   Kidney stones    Lung nodules    Migraine    Vertigo     Past Surgical History:  Procedure Laterality Date   BRONCHIAL NEEDLE ASPIRATION BIOPSY  02/26/2023   Procedure: BRONCHIAL NEEDLE ASPIRATION BIOPSIES;  Surgeon: Wilfredo Hanly, MD;  Location: [facility];  Service: Pulmonary;;   BRONCHIAL WASHINGS  02/26/2023   Procedure: BRONCHIAL WASHINGS;  Surgeon: Wilfredo Hanly, MD;  Location: [facility];  Service: Pulmonary;;   CESAREAN SECTION     CYSTOSCOPY WITH STENT PLACEMENT Right 07/04/2023   Procedure: CYSTOSCOPY AND RETROGRADE PYELOGRAM;  Surgeon: Andrez Banker, MD;  Location: [facility];  Service: Urology;  Laterality: Right;   ENDOBRONCHIAL ULTRASOUND Bilateral 02/26/2023   Procedure: ENDOBRONCHIAL ULTRASOUND;  Surgeon: Wilfredo Hanly, MD;  Location: [facility];  Service: Pulmonary;  Laterality: Bilateral;   MANDIBLE SURGERY     TUBAL LIGATION     VIDEO BRONCHOSCOPY  02/26/2023   Procedure: VIDEO BRONCHOSCOPY WITHOUT FLUORO;  Surgeon: Wilfredo Hanly, MD;  Location: [facility];  Service: Pulmonary;;    Family History  Problem Relation Age of Onset   Hypertension Daughter    Hypertension Son     Social History   Socioeconomic History   Marital status: Married    Spouse name: Not on file   Number of children: 7   Years of  education: Not on file   Highest education level: Not on file  Occupational History   Not on file  Tobacco Use   Smoking status: Former    Types: Cigarettes    Passive exposure: Never   Smokeless tobacco: Never  Vaping Use   Vaping status: Never Used  Substance  and Sexual Activity   Alcohol use: No    Alcohol/week: 0.0 standard drinks of alcohol   Drug use: No   Sexual activity: Yes    Birth control/protection: Surgical  Other Topics Concern   Not on file  Social History Narrative   Not on file   Social Drivers of Health   Financial Resource Strain: Low Risk  (10/10/2023)   Overall Financial Resource Strain (CARDIA)    Difficulty of Paying Living Expenses: Not hard at all  Food Insecurity: No Food Insecurity (10/10/2023)   Hunger Vital Sign    Worried About Running Out of Food in the Last Year: Never true    Ran Out of Food in the Last Year: Never true  Transportation Needs: No Transportation Needs (10/10/2023)   PRAPARE - Administrator, Civil Service (Medical): No    Lack of Transportation (Non-Medical): No  Physical Activity: Not on file  Stress: Stress Concern Present (10/10/2023)   [employer] of Occupational Health - Occupational Stress Questionnaire    Feeling of Stress : To some extent  Social Connections: Socially Integrated (10/10/2023)   Social Connection and Isolation Panel    Frequency of Communication with Friends and Family: More than three times a week    Frequency of Social Gatherings with Friends and Family: More than three times a week    Attends Religious Services: More than 4 times per year    Active Member of [employer] or Organizations: Yes    Attends Engineer, structural: More than 4 times per year    Marital Status: Married    Allergies  Allergen Reactions   Shellfish Allergy Swelling   Pork-Derived Products Nausea And Vomiting    Outpatient Medications Prior to Visit  Medication Sig Dispense Refill   acetaminophen  (TYLENOL ) 500 MG tablet Take 500 mg by mouth every 6 (six) hours as needed.     albuterol  (VENTOLIN  HFA) 108 (90 Base) MCG/ACT inhaler Inhale 2 puffs into the lungs every 4 (four) hours as needed for wheezing or shortness of breath. 6.7 g 1   alum & mag hydroxide-simeth  (MAALOX MAX) 400-400-40 MG/5ML suspension Take 10 mLs by mouth 3 (three) times daily as needed for indigestion. 355 mL 0   amLODipine  (NORVASC ) 5 MG tablet Take 1 tablet (5 mg total) by mouth daily. 90 tablet 1   Ascorbic Acid  (VITAMIN C ) 1000 MG tablet Take 1 tablet (1,000 mg total) by mouth daily. 100 tablet 1   cetirizine  (ZYRTEC ) 10 MG tablet Take 1 tablet (10 mg total) by mouth daily. 30 tablet 6   Cholecalciferol  (VITAMIN D ) 50 MCG (2000 UT) CAPS Take 1 capsule (2,000 Units total) by mouth daily. 90 capsule 1   fluticasone  (FLONASE ) 50 MCG/ACT nasal spray Place 2 sprays into both nostrils daily. 16 g 1   hydrOXYzine  (ATARAX ) 25 MG tablet Take 1 tablet (25 mg total) by mouth 3 (three) times daily as needed (Increase night time dose 50 mg). 270 tablet 1   meloxicam  (MOBIC ) 7.5 MG tablet Take 1 tablet (7.5 mg total) by mouth daily. 90 tablet 1   methocarbamol  (ROBAXIN ) 500 MG tablet Take 2 tablets (1,000  mg total) by mouth every 8 (eight) hours as needed. 90 tablet 1   topiramate  (TOPAMAX ) 100 MG tablet Take 1 tablet (100 mg total) by mouth at bedtime. 90 tablet 0   traMADol  (ULTRAM ) 50 MG tablet Take 1-2 tablets (50-100 mg total) by mouth every 6 (six) hours as needed for moderate pain (pain score 4-6). 10 tablet 0   atorvastatin  (LIPITOR) 40 MG tablet Take 1 tablet (40 mg total) by mouth daily. Please make appt with Dr. Farouk Vivero. 90 tablet 1   FLUoxetine  (PROZAC ) 40 MG capsule Take 1 capsule (40 mg total) by mouth daily. 90 capsule 1   gabapentin  (NEURONTIN ) 300 MG capsule Take 1 capsule (300 mg total) by mouth at bedtime. 90 capsule 1   pantoprazole  (PROTONIX ) 40 MG tablet Take 1 tablet (40 mg total) by mouth daily. 90 tablet 0   benzonatate  (TESSALON ) 100 MG capsule Take 1 capsule (100 mg total) by mouth every 8 (eight) hours. (Patient not taking: Reported on 01/10/2024) 21 capsule 0   No facility-administered medications prior to visit.     ROS Review of Systems  Constitutional:  Negative  for activity change and appetite change.  HENT:  Negative for sinus pressure and sore throat.   Respiratory:  Positive for cough. Negative for chest tightness, shortness of breath and wheezing.   Cardiovascular:  Negative for chest pain and palpitations.  Gastrointestinal:  Negative for abdominal distention, abdominal pain and constipation.  Genitourinary: Negative.   Musculoskeletal: Negative.   Psychiatric/Behavioral:  Negative for behavioral problems and dysphoric mood.     Objective:  BP 119/79   Pulse 68   Ht 5' 7 (1.702 m)   Wt 280 lb 3.2 oz (127.1 kg)   LMP 01/18/2012   SpO2 99%   BMI 43.89 kg/m      01/10/2024    9:45 AM 12/13/2023    5:50 PM 12/13/2023    3:10 PM  BP/Weight  Systolic BP 119 117 135  Diastolic BP 79 65 69  Wt. (Lbs) 280.2    BMI 43.89 kg/m2        Physical Exam Constitutional:      Appearance: She is well-developed.   Cardiovascular:     Rate and Rhythm: Normal rate.     Heart sounds: Normal heart sounds. No murmur heard. Pulmonary:     Effort: Pulmonary effort is normal.     Breath sounds: Normal breath sounds. No wheezing or rales.  Chest:     Chest wall: No tenderness.  Abdominal:     General: Bowel sounds are normal. There is no distension.     Palpations: Abdomen is soft. There is no mass.     Tenderness: There is no abdominal tenderness.   Musculoskeletal:        General: Normal range of motion.     Right lower leg: No edema.     Left lower leg: No edema.   Neurological:     Mental Status: She is alert and oriented to person, place, and time.   Psychiatric:        Mood and Affect: Mood normal.        Latest Ref Rng & Units 12/13/2023    1:18 PM 07/04/2023   12:39 AM 02/26/2023    3:52 AM  CMP  Glucose 70 - 99 mg/dL 604  540  981   BUN 6 - 20 mg/dL 12  14  16    Creatinine 0.44 - 1.00 mg/dL 1.91  4.78  2.95  Sodium 135 - 145 mmol/L 142  141  138   Potassium 3.5 - 5.1 mmol/L 3.5  3.5  3.7   Chloride 98 - 111 mmol/L 107   109  105   CO2 22 - 32 mmol/L 21  22  23    Calcium  8.9 - 10.3 mg/dL 9.5  9.6  8.9   Total Protein 6.5 - 8.1 g/dL 6.8  6.8    Total Bilirubin 0.0 - 1.2 mg/dL 0.3  0.5    Alkaline Phos 38 - 126 U/L 184  146    AST 15 - 41 U/L 32  17    ALT 0 - 44 U/L 27  22      Lipid Panel     Component Value Date/Time   CHOL 127 02/23/2023 0042   CHOL 146 08/17/2022 0925   TRIG 77 02/23/2023 0042   HDL 34 (L) 02/23/2023 0042   HDL 41 08/17/2022 0925   CHOLHDL 3.7 02/23/2023 0042   VLDL 15 02/23/2023 0042   LDLCALC 78 02/23/2023 0042   LDLCALC 88 08/17/2022 0925    CBC    Component Value Date/Time   WBC 5.3 12/13/2023 1318   RBC 4.77 12/13/2023 1318   HGB 12.7 12/13/2023 1318   HGB 12.6 02/15/2023 1005   HCT 41.3 12/13/2023 1318   HCT 40.0 02/15/2023 1005   PLT 298 12/13/2023 1318   PLT 306 02/15/2023 1005   MCV 86.6 12/13/2023 1318   MCV 86 02/15/2023 1005   MCH 26.6 12/13/2023 1318   MCHC 30.8 12/13/2023 1318   RDW 14.5 12/13/2023 1318   RDW 13.4 02/15/2023 1005   LYMPHSABS 1.4 02/15/2023 1005   MONOABS 0.8 06/25/2017 1801   EOSABS 0.1 02/15/2023 1005   BASOSABS 0.0 02/15/2023 1005    Lab Results  Component Value Date   HGBA1C 5.5 10/10/2023      1. Urinary symptom or sign (Primary) UA negative for UTI but revealed presence of small blood Will send a vaginal swab - POCT URINALYSIS DIP (CLINITEK) - Cervicovaginal ancillary only  2. At risk for cardiovascular event - atorvastatin  (LIPITOR) 40 MG tablet; Take 1 tablet (40 mg total) by mouth daily.  Dispense: 90 tablet; Refill: 1 - LP+Non-HDL Cholesterol  3. Anxiety Stable - FLUoxetine  (PROZAC ) 40 MG capsule; Take 1 capsule (40 mg total) by mouth daily.  Dispense: 90 capsule; Refill: 1  4. Bilateral carpal tunnel syndrome Controlled - gabapentin  (NEURONTIN ) 300 MG capsule; Take 1 capsule (300 mg total) by mouth at bedtime.  Dispense: 90 capsule; Refill: 1  5. Essential hypertension Controlled Continue current  antihypertensive  6. Subacute cough Advised to pick up Tessalon  Perles and albuterol  MDI from pharmacy If symptoms persist she has been educated to reach out to me and I will place her on a short course of prednisone  She needs to complete CT chest ordered by her pulmonologist to exclude underlying pulmonary etiology especially in the light of intermittent cough and wheezing  7. Gastroesophageal reflux disease without esophagitis Uncontrolled Famotidine added at bedtime Advised to avoid recumbency up to 2 hours postmeal, avoid late meals, avoid foods that trigger symptoms. - pantoprazole  (PROTONIX ) 40 MG tablet; Take 1 tablet (40 mg total) by mouth daily.  Dispense: 90 tablet; Refill: 1 - famotidine (PEPCID) 20 MG tablet; Take 1 tablet (20 mg total) by mouth at bedtime.  Dispense: 90 tablet; Refill: 1   Meds ordered this encounter  Medications   atorvastatin  (LIPITOR) 40 MG tablet    Sig: Take  1 tablet (40 mg total) by mouth daily.    Dispense:  90 tablet    Refill:  1   FLUoxetine  (PROZAC ) 40 MG capsule    Sig: Take 1 capsule (40 mg total) by mouth daily.    Dispense:  90 capsule    Refill:  1   gabapentin  (NEURONTIN ) 300 MG capsule    Sig: Take 1 capsule (300 mg total) by mouth at bedtime.    Dispense:  90 capsule    Refill:  1   pantoprazole  (PROTONIX ) 40 MG tablet    Sig: Take 1 tablet (40 mg total) by mouth daily.    Dispense:  90 tablet    Refill:  1   famotidine (PEPCID) 20 MG tablet    Sig: Take 1 tablet (20 mg total) by mouth at bedtime.    Dispense:  90 tablet    Refill:  1    Follow-up: Return in about 6 months (around 07/11/2024) for Chronic medical conditions.       Joaquin Mulberry, MD, FAAFP. [facility] [place], [place] [phone]   01/10/2024, 12:36 PM

## 2024-01-11 ENCOUNTER — Encounter (HOSPITAL_COMMUNITY): Payer: Self-pay

## 2024-01-11 ENCOUNTER — Emergency Department (HOSPITAL_COMMUNITY)
Admission: EM | Admit: 2024-01-11 | Discharge: 2024-01-11 | Disposition: A | Discharge status: Home / Self Care | Payer: Self-pay | Attending: Emergency Medicine | Admitting: Emergency Medicine

## 2024-01-11 ENCOUNTER — Ambulatory Visit: Payer: Self-pay | Admitting: Family Medicine

## 2024-01-11 ENCOUNTER — Other Ambulatory Visit: Payer: Self-pay

## 2024-01-11 ENCOUNTER — Emergency Department (HOSPITAL_COMMUNITY): Payer: Self-pay

## 2024-01-11 DIAGNOSIS — S93401A Sprain of unspecified ligament of right ankle, initial encounter: Secondary | ICD-10-CM

## 2024-01-11 DIAGNOSIS — M25561 Pain in right knee: Secondary | ICD-10-CM | POA: Insufficient documentation

## 2024-01-11 DIAGNOSIS — X501XXA Overexertion from prolonged static or awkward postures, initial encounter: Secondary | ICD-10-CM | POA: Insufficient documentation

## 2024-01-11 DIAGNOSIS — R519 Headache, unspecified: Secondary | ICD-10-CM | POA: Insufficient documentation

## 2024-01-11 LAB — CERVICOVAGINAL ANCILLARY ONLY
Bacterial Vaginitis (gardnerella): NEGATIVE
Candida Glabrata: POSITIVE — AB
Candida Vaginitis: POSITIVE — AB
Chlamydia: NEGATIVE
Comment: NEGATIVE
Comment: NEGATIVE
Comment: NEGATIVE
Comment: NEGATIVE
Comment: NEGATIVE
Comment: NORMAL
Neisseria Gonorrhea: NEGATIVE
Trichomonas: NEGATIVE

## 2024-01-11 LAB — LP+NON-HDL CHOLESTEROL
Cholesterol, Total: 153 mg/dL (ref 100–199)
HDL: 43 mg/dL (ref >39)
LDL Chol Calc (NIH): 91 mg/dL (ref 0–99)
Total Non-HDL-Chol (LDL+VLDL): 110 mg/dL (ref 0–129)
Triglycerides: 105 mg/dL (ref 0–149)
VLDL Cholesterol Cal: 19 mg/dL (ref 5–40)

## 2024-01-11 MED ORDER — ACETAMINOPHEN 500 MG PO TABS
1000.0000 mg | ORAL_TABLET | Freq: Once | ORAL | Status: AC
Start: 2024-01-11 — End: 2024-01-11
  Administered 2024-01-11: 1000 mg via ORAL
  Filled 2024-01-11: qty 2

## 2024-01-11 MED ORDER — IBUPROFEN 600 MG PO TABS: 600.0000 mg | ORAL_TABLET | Freq: Four times a day (QID) | ORAL | 0 refills | Status: AC | PRN

## 2024-01-11 MED ORDER — FLUCONAZOLE 150 MG PO TABS: 150.0000 mg | ORAL_TABLET | Freq: Once | ORAL | 0 refills | Status: AC

## 2024-01-11 NOTE — ED Provider Notes (Cosign Needed)
 [place] EMERGENCY DEPARTMENT AT [facility] Provider Note   CSN: [id number] Arrival date & time: 01/11/24  7829     Patient presents with: Monique Tyler   Monique Tyler is a 58 y.o. female who has right ankle pain and right knee pain after rolling the ankle and also laterally everting the right knee.  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  She did not feel a snap, or pop, but did notice sharp pain in the anterior aspect of the right foot as well as in the lateral right ankle.  Further has discomfort across the right knee primarily on the distal anterior aspect of the knee.  No head impact, no loss of consciousness, denies neck pain.  States that she does have a mild headache but this is chronic and was pre-existing before the injury occurred.    Fall       Prior to Admission medications   Medication Sig Start Date End Date Taking? Authorizing Provider  ibuprofen  (ADVIL ) 600 MG tablet Take 1 tablet (600 mg total) by mouth every 6 (six) hours as needed. 01/11/24  Yes Juanetta Nordmann, PA  acetaminophen  (TYLENOL ) 500 MG tablet Take 500 mg by mouth every 6 (six) hours as needed.    [provider]  albuterol  (VENTOLIN  HFA) 108 (90 Base) MCG/ACT inhaler Inhale 2 puffs into the lungs every 4 (four) hours as needed for wheezing or shortness of breath. 12/26/23   Newlin, Enobong, MD  alum & mag hydroxide-simeth (MAALOX MAX) 400-400-40 MG/5ML suspension Take 10 mLs by mouth 3 (three) times daily as needed for indigestion. 02/26/23   Aisha Hove, MD  amLODipine  (NORVASC ) 5 MG tablet Take 1 tablet (5 mg total) by mouth daily. 10/10/23   Hassie Lint, PA-C  Ascorbic Acid  (VITAMIN C ) 1000 MG tablet Take 1 tablet (1,000 mg total) by mouth daily. 08/30/22   Newlin, Enobong, MD  atorvastatin   (LIPITOR) 40 MG tablet Take 1 tablet (40 mg total) by mouth daily. 01/10/24   Newlin, Enobong, MD  benzonatate  (TESSALON ) 100 MG capsule Take 1 capsule (100 mg total) by mouth every 8 (eight) hours. Patient not taking: Reported on 01/10/2024 12/26/23   Newlin, Enobong, MD  cetirizine  (ZYRTEC ) 10 MG tablet Take 1 tablet (10 mg total) by mouth daily. 11/03/19   Newlin, Enobong, MD  Cholecalciferol  (VITAMIN D ) 50 MCG (2000 UT) CAPS Take 1 capsule (2,000 Units total) by mouth daily. 03/16/21   Hassie Lint, PA-C  famotidine (PEPCID) 20 MG tablet Take 1 tablet (20 mg total) by mouth at bedtime. 01/10/24   Newlin, Enobong, MD  fluconazole  (DIFLUCAN ) 150 MG tablet Take 1 tablet (150 mg total) by mouth once for 1 dose. 01/11/24 01/11/24  Newlin, Enobong, MD  FLUoxetine  (PROZAC ) 40 MG capsule Take 1 capsule (40 mg total) by mouth daily. 01/10/24   Newlin, Enobong, MD  fluticasone  (FLONASE ) 50 MCG/ACT nasal spray Place 2 sprays into both nostrils daily. 11/03/19   Newlin, Enobong, MD  gabapentin  (NEURONTIN ) 300 MG capsule Take 1 capsule (300 mg total) by mouth at bedtime. 01/10/24   Newlin, Enobong, MD  hydrOXYzine  (ATARAX ) 25 MG tablet Take 1 tablet (25 mg total) by mouth 3 (three) times daily as needed (Increase night time dose 50 mg). 10/10/23   Hassie Lint, PA-C  meloxicam  (  MOBIC ) 7.5 MG tablet Take 1 tablet (7.5 mg total) by mouth daily. 10/10/23   Hassie Lint, PA-C  methocarbamol  (ROBAXIN ) 500 MG tablet Take 2 tablets (1,000 mg total) by mouth every 8 (eight) hours as needed. 10/10/23   Hassie Lint, PA-C  pantoprazole  (PROTONIX ) 40 MG tablet Take 1 tablet (40 mg total) by mouth daily. 01/10/24   Newlin, Enobong, MD  topiramate  (TOPAMAX ) 100 MG tablet Take 1 tablet (100 mg total) by mouth at bedtime. 10/10/23   Hassie Lint, PA-C  traMADol  (ULTRAM ) 50 MG tablet Take 1-2 tablets (50-100 mg total) by mouth every 6 (six) hours as needed for moderate pain (pain score 4-6). 07/04/23   Andrez Banker, MD    Allergies: Shellfish allergy and Pork-derived products    Review of Systems  Musculoskeletal:  Positive for arthralgias and joint swelling.  All other systems reviewed and are negative.   Updated Vital Signs Pulse 86   Temp 98.6 F (37 C) (Oral)   Resp 18   LMP 01/18/2012   SpO2 98%   Physical Exam Vitals and nursing note reviewed.  Constitutional:      General: She is not in acute distress.    Appearance: Normal appearance. She is well-developed.  HENT:     Head: Normocephalic and atraumatic.     Mouth/Throat:     Mouth: Mucous membranes are moist.     Pharynx: Oropharynx is clear.   Eyes:     Extraocular Movements: Extraocular movements intact.     Conjunctiva/sclera: Conjunctivae normal.     Pupils: Pupils are equal, round, and reactive to light.    Cardiovascular:     Rate and Rhythm: Normal rate and regular rhythm.     Pulses: Normal pulses.     Heart sounds: Normal heart sounds. No murmur heard.    No friction rub. No gallop.  Pulmonary:     Effort: Pulmonary effort is normal. No respiratory distress.     Breath sounds: Normal breath sounds.  Abdominal:     General: Abdomen is flat. Bowel sounds are normal.     Palpations: Abdomen is soft.     Tenderness: There is no abdominal tenderness.   Musculoskeletal:        General: No swelling.     Cervical back: Normal range of motion and neck supple.     Right knee: No swelling, deformity, bony tenderness or crepitus. Tenderness present over the medial joint line and lateral joint line. Normal alignment. Normal pulse.     Left knee: Normal.     Right ankle: Swelling present. No deformity, ecchymosis or lacerations. Tenderness present over the medial malleolus, ATF ligament and posterior TF ligament. No base of 5th metatarsal or proximal fibula tenderness. Decreased range of motion.     Left ankle: Normal.   Skin:    General: Skin is warm and dry.     Capillary Refill: Capillary refill takes  less than 2 seconds.   Neurological:     General: No focal deficit present.     Mental Status: She is alert. Mental status is at baseline.   Psychiatric:        Mood and Affect: Mood normal.     (all labs ordered are listed, but only abnormal results are displayed) Labs Reviewed - No data to display  EKG: None  Radiology: DG Knee Complete 4 Views Right Result Date: 01/11/2024 CLINICAL DATA:  Right knee pain, twisting injury EXAM: RIGHT KNEE - COMPLETE  4+ VIEW COMPARISON:  None Available. FINDINGS: Osteophyte of the tibial spine on the frontal projection is roughly similar to 03/08/2021. Mild marginal spurring all 3 compartments compatible with mild osteoarthritis. No significant knee effusion is observed. IMPRESSION: 1. Mild osteoarthritis. No acute radiographic findings. Electronically Signed   By: Freida Jes M.D.   On: 01/11/2024 20:29     Procedures   Medications Ordered in the ED  acetaminophen  (TYLENOL ) tablet 1,000 mg (1,000 mg Oral Given 01/11/24 1947)                                    Medical Decision Making Amount and/or Complexity of Data Reviewed Radiology: ordered.  Risk OTC drugs. Prescription drug management.   Medical Decision Making:   Monique Tyler is a 58 y.o. female who presented to the ED today with right ankle and right knee pain detailed above.     Complete initial physical exam performed, notably the patient  was alert and oriented in no apparent distress.  There is notable swelling to the dorsum of the right foot as well as tenderness to the right ankle, specifically to the medial malleolus, anterior talofibular ligament, posterior talofibular ligament.    Reviewed and confirmed nursing documentation for past medical history, family history, social history.    Initial Assessment:   With the patient's presentation of ankle pain and knee pain, most likely diagnosis is acute sprain of the right ankle as well as sprain of the right knee.  Other diagnoses were considered including (but not limited to) ankle fracture, knee fracture, ACL rupture. These are considered less likely due to history of present illness and physical exam findings.     Initial Plan:  Obtain plain film imaging of the right knee and right ankle  Objective evaluation as below reviewed   Initial Study Results:    Radiology:  All images reviewed independently. Agree with radiology report at this time.   DG Knee Complete 4 Views Right Result Date: 01/11/2024 CLINICAL DATA:  Right knee pain, twisting injury EXAM: RIGHT KNEE - COMPLETE 4+ VIEW COMPARISON:  None Available. FINDINGS: Osteophyte of the tibial spine on the frontal projection is roughly similar to 03/08/2021. Mild marginal spurring all 3 compartments compatible with mild osteoarthritis. No significant knee effusion is observed. IMPRESSION: 1. Mild osteoarthritis. No acute radiographic findings. Electronically Signed   By: Freida Jes M.D.   On: 01/11/2024 20:29   CT Head Wo Contrast Result Date: 12/13/2023 CLINICAL DATA:  Headache, increasing frequency or severity Headache, new onset (Age >= 51y) EXAM: CT HEAD WITHOUT CONTRAST TECHNIQUE: Contiguous axial images were obtained from the base of the skull through the vertex without intravenous contrast. RADIATION DOSE REDUCTION: This exam was performed according to the departmental dose-optimization program which includes automated exposure control, adjustment of the mA and/or kV according to patient size and/or use of iterative reconstruction technique. COMPARISON:  Head CT 05/08/2017 FINDINGS: Brain: No intracranial hemorrhage, mass effect, or midline shift. No hydrocephalus. The basilar cisterns are patent. No evidence of territorial infarct or acute ischemia. No extra-axial or intracranial fluid collection. Vascular: No hyperdense vessel or unexpected calcification. Skull: No fracture or focal lesion. Sinuses/Orbits: Paranasal sinuses and mastoid air  cells are clear. The visualized orbits are unremarkable. Other: None. IMPRESSION: Normal head CT. Electronically Signed   By: Chadwick Colonel M.D.   On: 12/13/2023 17:28    .   Reassessment and Plan:  Based on assessment this patient, believe findings consistent with right ankle sprain along with acute right knee pain.  Will apply Ace wrap to the right knee and ankle brace to the right ankle, prescribe oral ibuprofen  for pain management along with directions to elevate the affected extremity.  Follow-up with orthopedics within 1 to 2 weeks.       Final diagnoses:  Sprain of right ankle, unspecified ligament, initial encounter  Acute pain of right knee    ED Discharge Orders          Ordered    ibuprofen  (ADVIL ) 600 MG tablet  Every 6 hours PRN        01/11/24 2024               Juanetta Nordmann, [place] 01/11/24 2033

## 2024-01-11 NOTE — ED Triage Notes (Signed)
 Pt came in after twisting her right knee and ankle. 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 Pt also c/o a headache but did not hit her head.

## 2024-01-11 NOTE — Progress Notes (Signed)
 Orthopedic Tech Progress Note Patient Details:  Monique Tyler September 25, 1965 [id number]  Ortho Devices Type of Ortho Device: ASO, Ace wrap Ortho Device/Splint Location: Right knee & Right ankle Ortho Device/Splint Interventions: Application   Post Interventions Patient Tolerated: Well  [name] [name] 01/11/2024, 8:49 PM

## 2024-01-16 ENCOUNTER — Other Ambulatory Visit: Payer: Self-pay

## 2024-01-23 ENCOUNTER — Other Ambulatory Visit: Payer: Self-pay | Admitting: Physician Assistant

## 2024-01-23 ENCOUNTER — Other Ambulatory Visit: Payer: Self-pay

## 2024-01-23 DIAGNOSIS — G43709 Chronic migraine without aura, not intractable, without status migrainosus: Secondary | ICD-10-CM

## 2024-01-23 MED ORDER — TOPIRAMATE 100 MG PO TABS
100.0000 mg | ORAL_TABLET | Freq: Every day | ORAL | 1 refills | Status: DC
  Filled 2024-01-23: qty 30, 30d supply, fill #0
  Filled 2024-03-03: qty 30, 30d supply, fill #1
  Filled 2024-03-13: qty 30, 30d supply, fill #2
  Filled 2024-04-17: qty 30, 30d supply, fill #3
  Filled 2024-05-16: qty 30, 30d supply, fill #4
  Filled 2024-06-13: qty 30, 30d supply, fill #5

## 2024-01-28 ENCOUNTER — Other Ambulatory Visit: Payer: Self-pay

## 2024-02-12 ENCOUNTER — Other Ambulatory Visit: Payer: Self-pay

## 2024-02-19 ENCOUNTER — Other Ambulatory Visit: Payer: Self-pay

## 2024-02-19 ENCOUNTER — Ambulatory Visit: Payer: PRIVATE HEALTH INSURANCE | Attending: Family Medicine

## 2024-02-19 ENCOUNTER — Telehealth: Payer: Self-pay

## 2024-02-19 DIAGNOSIS — Z111 Encounter for screening for respiratory tuberculosis: Secondary | ICD-10-CM

## 2024-02-19 DIAGNOSIS — Z23 Encounter for immunization: Secondary | ICD-10-CM

## 2024-02-19 NOTE — Progress Notes (Signed)
 Tuberculin skin test applied to right ventral forearm.

## 2024-02-19 NOTE — Telephone Encounter (Signed)
 Copied from CRM #[id number]. Topic: Clinical - Request for Lab/Test Order >> Feb 18, 2024  1:02 PM Cleave MATSU wrote: Reason for CRM: pt needs tb skin test record sent over to place of employment ASAP fax number [phone]

## 2024-02-19 NOTE — Telephone Encounter (Signed)
 fyi

## 2024-02-21 ENCOUNTER — Ambulatory Visit: Payer: Self-pay | Attending: Family Medicine

## 2024-02-21 LAB — TB SKIN TEST
Induration: 0 mm
TB Skin Test: NEGATIVE

## 2024-03-02 NOTE — Progress Notes (Signed)
 Established Patient Office Visit  Subjective   Patient ID: Monique Tyler, female    DOB: [birth year]  Age: 58 y.o. 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  She presents with leg pain and foot pain.  She was last seen in June.  She had urinary symptoms and a cervical vaginal.  Both of these were unremarkable.  She was placed on the statin.  She is on Prozac  for anxiety has carpal tunnel controlled with gabapentin  blood pressure on arrival is normal on medication she does have a cough that has persisted she has a history of lymphadenopathy bronchoscopy unremarkable she has follow-up with pulmonary and repeat CT scan.  Reflux is currently not well-controlled she is on Pepcid  pantoprazole  and famotidine  at bedtime  She has a CPAP machine but cannot use it because she is coughing at night The patient needs updated dental and eye exam      Review of Systems  Constitutional:  Negative for chills, diaphoresis, fever, malaise/fatigue and weight loss.  HENT:  Negative for congestion, hearing loss, nosebleeds, sinus pain, sore throat and tinnitus.   Eyes:  Negative for blurred vision, photophobia and redness.  Respiratory:  Positive for cough, shortness of breath and wheezing. Negative for hemoptysis, sputum production and stridor.   Cardiovascular:  Negative for chest pain, palpitations, orthopnea, claudication, leg swelling and PND.  Gastrointestinal:  Positive for heartburn. Negative for abdominal pain, blood in stool, constipation, diarrhea, nausea and vomiting.  Genitourinary:  Negative for dysuria, flank pain, frequency, hematuria and urgency.  Musculoskeletal:  Negative for back pain, falls, joint pain, myalgias and neck pain.  Skin:  Negative for itching and rash.  Neurological:  Negative for dizziness, tingling,  tremors, sensory change, speech change, focal weakness, seizures, loss of consciousness, weakness and headaches.  Endo/Heme/Allergies:  Negative for environmental allergies and polydipsia. Does not bruise/bleed easily.  Psychiatric/Behavioral:  Negative for depression, memory loss, substance abuse and suicidal ideas. The patient is not nervous/anxious and does not have insomnia.       Objective:     BP 121/82   Pulse 66   Ht 5' 7 (1.702 m)   Wt 282 lb 6.4 oz (128.1 kg)   LMP 01/18/2012   SpO2 100%   BMI 44.23 kg/m    Physical Exam Vitals reviewed.  Constitutional:      Appearance: Normal appearance. She is well-developed. She is obese. She is not diaphoretic.  HENT:     Head: Normocephalic and atraumatic.     Nose: No nasal deformity, septal deviation, mucosal edema or rhinorrhea.     Right Sinus: No maxillary sinus tenderness or frontal sinus tenderness.     Left Sinus: No maxillary sinus tenderness or frontal sinus tenderness.     Mouth/Throat:     Pharynx: No oropharyngeal exudate.  Eyes:     General: No scleral icterus.    Conjunctiva/sclera: Conjunctivae normal.     Pupils: Pupils are equal, round, and reactive to light.  Neck:     Thyroid : No thyromegaly.     Vascular: No carotid bruit or JVD.     Trachea: Trachea normal. No tracheal tenderness or tracheal deviation.  Cardiovascular:     Rate and Rhythm: Normal rate and regular rhythm.     Chest Wall: PMI is not displaced.  Pulses: Normal pulses. No decreased pulses.     Heart sounds: Normal heart sounds, S1 normal and S2 normal. Heart sounds not distant. No murmur heard.    No systolic murmur is present.     No diastolic murmur is present.     No friction rub. No gallop. No S3 or S4 sounds.  Pulmonary:     Effort: Pulmonary effort is normal. No tachypnea, accessory muscle usage or respiratory distress.     Breath sounds: No stridor. Wheezing present. No decreased breath sounds, rhonchi or rales.  Chest:      Chest wall: No tenderness.  Abdominal:     General: Bowel sounds are normal. There is no distension.     Palpations: Abdomen is soft. Abdomen is not rigid.     Tenderness: There is no abdominal tenderness. There is no guarding or rebound.  Musculoskeletal:        General: No swelling, tenderness, deformity or signs of injury. Normal range of motion.     Cervical back: Normal range of motion and neck supple. No edema, erythema or rigidity. No muscular tenderness. Normal range of motion.     Right lower leg: No edema.     Left lower leg: No edema.  Lymphadenopathy:     Head:     Right side of head: No submental or submandibular adenopathy.     Left side of head: No submental or submandibular adenopathy.     Cervical: No cervical adenopathy.  Skin:    General: Skin is warm and dry.     Coloration: Skin is not pale.     Findings: No rash.     Nails: There is no clubbing.  Neurological:     Mental Status: She is alert and oriented to person, place, and time.     Sensory: No sensory deficit.  Psychiatric:        Speech: Speech normal.        Behavior: Behavior normal.      No results found for any visits on 03/06/24.    The 10-year ASCVD risk score (Arnett DK, et al., 2019) is: 5%    Assessment & Plan:   Problem List Items Addressed This Visit       Cardiovascular and Mediastinum   Migraine   Continue topiramate       Mediastinal adenopathy   Patient will follow-up with pulmonary      Essential hypertension   Currently at goal continue amlodipine         Respiratory   Obstructive sleep apnea   Due to coughing at night cannot tolerate CPAP when she is not coughing CPAP at 10 cm is controlling her sleep apnea      Asthma, cough variant - Primary   Cough variant asthma give pulse prednisone  and azithromycin  continue albuterol  as needed renew Qvar  twice daily take Delsym as needed at night      Relevant Medications   beclomethasone (QVAR  REDIHALER) 40 MCG/ACT  inhaler   predniSONE  (DELTASONE ) 10 MG tablet     Digestive   Gastroesophageal reflux disease without esophagitis   Continue with proton pump inhibitor and famotidine  diet given        Musculoskeletal and Integument   OA (osteoarthritis) of knee   Suspect leg pain due to osteoarthritis of the knee patient given higher dose ibuprofen  600 mg every 8 hours as needed      Relevant Medications   predniSONE  (DELTASONE ) 10 MG tablet     Other  Morbid obesity (HCC)   BMI elevated at 44 given lifestyle medicine approach      Other Visit Diagnoses       Encounter for screening mammogram for malignant neoplasm of breast       Relevant Orders   MS 3D SCR MAMMO BILAT BR (aka MM)       Return in about 4 months (around 07/06/2024) for primary care follow up.  30 minutes spent reviewing multiple problems also will get mammogram  Belvie Silvan, MD

## 2024-03-05 ENCOUNTER — Other Ambulatory Visit: Payer: Self-pay

## 2024-03-06 ENCOUNTER — Ambulatory Visit: Payer: PRIVATE HEALTH INSURANCE | Attending: Critical Care Medicine | Admitting: Critical Care Medicine

## 2024-03-06 ENCOUNTER — Other Ambulatory Visit: Payer: Self-pay

## 2024-03-06 ENCOUNTER — Encounter: Payer: Self-pay | Admitting: Critical Care Medicine

## 2024-03-06 VITALS — BP 121/82 | HR 66 | Ht 67.0 in | Wt 282.4 lb

## 2024-03-06 DIAGNOSIS — I10 Essential (primary) hypertension: Secondary | ICD-10-CM

## 2024-03-06 DIAGNOSIS — Z1231 Encounter for screening mammogram for malignant neoplasm of breast: Secondary | ICD-10-CM

## 2024-03-06 DIAGNOSIS — J45991 Cough variant asthma: Secondary | ICD-10-CM | POA: Diagnosis not present

## 2024-03-06 DIAGNOSIS — R590 Localized enlarged lymph nodes: Secondary | ICD-10-CM | POA: Diagnosis not present

## 2024-03-06 DIAGNOSIS — M1711 Unilateral primary osteoarthritis, right knee: Secondary | ICD-10-CM

## 2024-03-06 DIAGNOSIS — K219 Gastro-esophageal reflux disease without esophagitis: Secondary | ICD-10-CM

## 2024-03-06 DIAGNOSIS — G4733 Obstructive sleep apnea (adult) (pediatric): Secondary | ICD-10-CM

## 2024-03-06 DIAGNOSIS — G43909 Migraine, unspecified, not intractable, without status migrainosus: Secondary | ICD-10-CM

## 2024-03-06 MED ORDER — QVAR REDIHALER 40 MCG/ACT IN AERB
2.0000 | INHALATION_SPRAY | Freq: Two times a day (BID) | RESPIRATORY_TRACT | 12 refills | Status: DC
Start: 2024-03-06 — End: 2024-03-06

## 2024-03-06 MED ORDER — PREDNISONE 10 MG PO TABS
ORAL_TABLET | ORAL | 0 refills | Status: DC
  Filled 2024-03-06: qty 20, 5d supply, fill #0

## 2024-03-06 MED ORDER — AZITHROMYCIN 250 MG PO TABS
ORAL_TABLET | ORAL | 0 refills | Status: DC
Start: 2024-03-06 — End: 2024-03-06

## 2024-03-06 MED ORDER — AZITHROMYCIN 250 MG PO TABS
ORAL_TABLET | ORAL | 0 refills | Status: AC
  Filled 2024-03-06 (×2): qty 6, 5d supply, fill #0

## 2024-03-06 MED ORDER — PREDNISONE 10 MG PO TABS
ORAL_TABLET | ORAL | 0 refills | Status: DC
Start: 2024-03-06 — End: 2024-03-06

## 2024-03-06 MED ORDER — QVAR REDIHALER 40 MCG/ACT IN AERB
2.0000 | INHALATION_SPRAY | Freq: Two times a day (BID) | RESPIRATORY_TRACT | 12 refills | Status: AC
  Filled 2024-03-06: qty 10.6, 30d supply, fill #0

## 2024-03-06 NOTE — Patient Instructions (Signed)
 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 Pearla  Return to see your primary care provider 4 months

## 2024-03-07 ENCOUNTER — Encounter: Payer: Self-pay | Admitting: Critical Care Medicine

## 2024-03-07 ENCOUNTER — Ambulatory Visit (HOSPITAL_BASED_OUTPATIENT_CLINIC_OR_DEPARTMENT_OTHER): Payer: PRIVATE HEALTH INSURANCE

## 2024-03-07 DIAGNOSIS — J45991 Cough variant asthma: Secondary | ICD-10-CM | POA: Insufficient documentation

## 2024-03-07 NOTE — Assessment & Plan Note (Signed)
 Suspect leg pain due to osteoarthritis of the knee patient given higher dose ibuprofen  600 mg every 8 hours as needed

## 2024-03-07 NOTE — Assessment & Plan Note (Signed)
 Due to coughing at night cannot tolerate CPAP when she is not coughing CPAP at 10 cm is controlling her sleep apnea

## 2024-03-07 NOTE — Assessment & Plan Note (Signed)
 Patient will follow-up with pulmonary.

## 2024-03-07 NOTE — Assessment & Plan Note (Signed)
 Continue with proton pump inhibitor and famotidine  diet given

## 2024-03-07 NOTE — Assessment & Plan Note (Signed)
 BMI elevated at 44 given lifestyle medicine approach

## 2024-03-07 NOTE — Assessment & Plan Note (Signed)
 Currently at goal continue amlodipine 

## 2024-03-07 NOTE — Assessment & Plan Note (Signed)
 Continue topiramate.

## 2024-03-07 NOTE — Assessment & Plan Note (Signed)
 Cough variant asthma give pulse prednisone  and azithromycin  continue albuterol  as needed renew Qvar  twice daily take Delsym as needed at night

## 2024-03-11 ENCOUNTER — Ambulatory Visit (HOSPITAL_BASED_OUTPATIENT_CLINIC_OR_DEPARTMENT_OTHER): Payer: PRIVATE HEALTH INSURANCE | Attending: Pulmonary Disease

## 2024-03-13 ENCOUNTER — Other Ambulatory Visit: Payer: Self-pay

## 2024-03-14 ENCOUNTER — Other Ambulatory Visit: Payer: Self-pay

## 2024-04-17 ENCOUNTER — Other Ambulatory Visit: Payer: Self-pay

## 2024-05-16 ENCOUNTER — Other Ambulatory Visit: Payer: Self-pay

## 2024-05-16 ENCOUNTER — Other Ambulatory Visit: Payer: Self-pay | Admitting: Physician Assistant

## 2024-05-16 DIAGNOSIS — I10 Essential (primary) hypertension: Secondary | ICD-10-CM

## 2024-05-19 ENCOUNTER — Other Ambulatory Visit: Payer: Self-pay

## 2024-05-19 MED ORDER — AMLODIPINE BESYLATE 5 MG PO TABS
5.0000 mg | ORAL_TABLET | Freq: Every day | ORAL | 0 refills | Status: DC
  Filled 2024-05-19: qty 90, 90d supply, fill #0

## 2024-05-19 NOTE — Telephone Encounter (Signed)
 Requested Prescriptions  Pending Prescriptions Disp Refills   amLODipine  (NORVASC ) 5 MG tablet 90 tablet 0    Sig: Take 1 tablet (5 mg total) by mouth daily.     Cardiovascular: Calcium  Channel Blockers 2 Passed - 05/19/2024 12:22 PM      Passed - Last BP in normal range    BP Readings from Last 1 Encounters:  03/06/24 121/82         Passed - Last Heart Rate in normal range    Pulse Readings from Last 1 Encounters:  03/06/24 66         Passed - Valid encounter within last 6 months    Recent Outpatient Visits           2 months ago Asthma, cough variant   [facility] [facility] [place]. [facility] Brien Belvie BRAVO, MD   4 months ago Urinary symptom or sign   [facility] [place]. [facility] Delbert Clam, MD   7 months ago Suspected UTI   [facility] [place] [facility] [place]. [facility] [place], Jon HERO, [place]   1 year ago Hilar lymphadenopathy   [place] [facility] [place] [facility] [place]. [facility] Delbert Clam, MD   1 year ago Screening for colon cancer    [facility] [place]. [facility] Delbert Clam, MD       Future Appointments             In 1 month Delbert Clam, MD [facility] [place] [facility] [place]. [facility] [place]

## 2024-05-21 ENCOUNTER — Other Ambulatory Visit: Payer: Self-pay

## 2024-06-13 ENCOUNTER — Other Ambulatory Visit: Payer: Self-pay

## 2024-07-07 ENCOUNTER — Ambulatory Visit: Payer: PRIVATE HEALTH INSURANCE | Attending: Family Medicine | Admitting: Family Medicine

## 2024-07-07 ENCOUNTER — Other Ambulatory Visit: Payer: Self-pay

## 2024-07-07 ENCOUNTER — Encounter: Payer: Self-pay | Admitting: Family Medicine

## 2024-07-07 VITALS — BP 127/85 | HR 63 | Temp 98.4°F | Ht 67.0 in | Wt 277.8 lb

## 2024-07-07 DIAGNOSIS — Z9189 Other specified personal risk factors, not elsewhere classified: Secondary | ICD-10-CM

## 2024-07-07 DIAGNOSIS — G43709 Chronic migraine without aura, not intractable, without status migrainosus: Secondary | ICD-10-CM

## 2024-07-07 DIAGNOSIS — G5603 Carpal tunnel syndrome, bilateral upper limbs: Secondary | ICD-10-CM

## 2024-07-07 DIAGNOSIS — F419 Anxiety disorder, unspecified: Secondary | ICD-10-CM

## 2024-07-07 DIAGNOSIS — J3089 Other allergic rhinitis: Secondary | ICD-10-CM

## 2024-07-07 DIAGNOSIS — M722 Plantar fascial fibromatosis: Secondary | ICD-10-CM

## 2024-07-07 DIAGNOSIS — I10 Essential (primary) hypertension: Secondary | ICD-10-CM

## 2024-07-07 DIAGNOSIS — Z23 Encounter for immunization: Secondary | ICD-10-CM

## 2024-07-07 DIAGNOSIS — R053 Chronic cough: Secondary | ICD-10-CM

## 2024-07-07 DIAGNOSIS — M1711 Unilateral primary osteoarthritis, right knee: Secondary | ICD-10-CM

## 2024-07-07 DIAGNOSIS — K219 Gastro-esophageal reflux disease without esophagitis: Secondary | ICD-10-CM

## 2024-07-07 MED ORDER — GABAPENTIN 300 MG PO CAPS
300.0000 mg | ORAL_CAPSULE | Freq: Every day | ORAL | 1 refills | Status: AC
  Filled 2024-07-07: qty 90, 90d supply, fill #0

## 2024-07-07 MED ORDER — FAMOTIDINE 20 MG PO TABS
20.0000 mg | ORAL_TABLET | Freq: Every day | ORAL | 1 refills | Status: AC
  Filled 2024-07-07: qty 90, 90d supply, fill #0

## 2024-07-07 MED ORDER — PANTOPRAZOLE SODIUM 40 MG PO TBEC
40.0000 mg | DELAYED_RELEASE_TABLET | Freq: Every day | ORAL | 1 refills | Status: AC
  Filled 2024-07-07: qty 90, 90d supply, fill #0

## 2024-07-07 MED ORDER — TOPIRAMATE 100 MG PO TABS
100.0000 mg | ORAL_TABLET | Freq: Every day | ORAL | 1 refills | Status: AC
  Filled 2024-07-07: qty 90, 90d supply, fill #0

## 2024-07-07 MED ORDER — VITAMIN C 1000 MG PO TABS
1000.0000 mg | ORAL_TABLET | Freq: Every day | ORAL | 1 refills | Status: AC
  Filled 2024-07-07: qty 100, 100d supply, fill #0

## 2024-07-07 MED ORDER — ALBUTEROL SULFATE HFA 108 (90 BASE) MCG/ACT IN AERS
2.0000 | INHALATION_SPRAY | RESPIRATORY_TRACT | 1 refills | Status: AC | PRN
  Filled 2024-07-07: qty 6.7, 17d supply, fill #0

## 2024-07-07 MED ORDER — PREDNISONE 20 MG PO TABS
20.0000 mg | ORAL_TABLET | Freq: Every day | ORAL | 0 refills | Status: AC
  Filled 2024-07-07: qty 5, 5d supply, fill #0

## 2024-07-07 MED ORDER — FLUOXETINE HCL 40 MG PO CAPS
40.0000 mg | ORAL_CAPSULE | Freq: Every day | ORAL | 1 refills | Status: AC
  Filled 2024-07-07 – 2024-08-15 (×2): qty 90, 90d supply, fill #0

## 2024-07-07 MED ORDER — CETIRIZINE HCL 10 MG PO TABS
10.0000 mg | ORAL_TABLET | Freq: Every day | ORAL | 1 refills | Status: AC
  Filled 2024-07-07: qty 30, 30d supply, fill #0

## 2024-07-07 MED ORDER — ATORVASTATIN CALCIUM 40 MG PO TABS
40.0000 mg | ORAL_TABLET | Freq: Every day | ORAL | 1 refills | Status: AC
  Filled 2024-07-07 – 2024-08-15 (×2): qty 90, 90d supply, fill #0

## 2024-07-07 MED ORDER — FLUTICASONE PROPIONATE 50 MCG/ACT NA SUSP
2.0000 | Freq: Every day | NASAL | 1 refills | Status: AC
  Filled 2024-07-07: qty 16, 30d supply, fill #0

## 2024-07-07 MED ORDER — BENZONATATE 100 MG PO CAPS
100.0000 mg | ORAL_CAPSULE | Freq: Two times a day (BID) | ORAL | 1 refills | Status: AC | PRN
  Filled 2024-07-07: qty 60, 30d supply, fill #0

## 2024-07-07 MED ORDER — AMLODIPINE BESYLATE 5 MG PO TABS
5.0000 mg | ORAL_TABLET | Freq: Every day | ORAL | 1 refills | Status: AC
  Filled 2024-07-07 – 2024-08-15 (×2): qty 90, 90d supply, fill #0

## 2024-07-07 NOTE — Patient Instructions (Signed)
 VISIT SUMMARY:  During today's visit, we discussed your persistent respiratory symptoms, including a dry cough and shortness of breath, as well as your foot pain, reflux, migraines, blood pressure, anxiety, and seasonal allergies. We reviewed your current medications and made some adjustments to better manage your symptoms. We also discussed financial assistance options for your pulmonary referral and other necessary tests.  YOUR PLAN:  -CHRONIC COUGH WITH DYSPNEA: This means you have a persistent dry cough and difficulty breathing, especially at night and with physical activity. We suspect it might be due to conditions like sarcoidosis or chronic lung disease. We have prescribed prednisone  for 5 days and Tessalon  Perles to help with the cough. We also renewed your prescriptions for Flonase  and Zyrtec . Please apply for the [facility] financial discount so we can proceed with a pulmonary referral.  -PLANTAR FASCIITIS: This is a condition that causes severe foot pain, especially when walking. We have referred you to a podiatrist for further evaluation and possible corticosteroid injections.  -GASTROESOPHAGEAL REFLUX DISEASE: This condition causes stomach acid to flow back into your esophagus, leading to heartburn. Continue taking Protonix  as prescribed.  -CHRONIC MIGRAINE: These are severe headaches that can cause significant pain. Continue taking Topamax  as prescribed to manage your migraines.  -ESSENTIAL HYPERTENSION: This means you have high blood pressure. Your blood pressure is well-controlled with your current medications, so continue taking them as prescribed.  -ANXIETY DISORDER: This condition involves feelings of worry and stress. We encourage you to set boundaries and maintain an outlet for stress relief. Consider engaging with a therapist for additional support.  -SEASONAL ALLERGIC RHINITIS: This is an allergic reaction that causes sneezing, runny nose, and other similar symptoms. We have  renewed your prescriptions for Zyrtec  and Flonase  to help manage these symptoms.  INSTRUCTIONS:  Please apply for the [facility] financial discount for your pulmonary referral and mammogram. Follow up with the podiatrist for your foot pain. Continue taking your medications as prescribed. We administered your flu shot today. If you have any new or worsening symptoms, please contact our office.

## 2024-07-07 NOTE — Progress Notes (Signed)
 Subjective:  Patient ID: Monique Tyler, female    DOB: 12-26-1965  Age: 57 y.o. MRN: [id number]  CC: Medical Management of Chronic Issues (Asthma get worst with movement /Right foot and knee pain )     Discussed the use of AI scribe software for clinical note transcription with the patient, who gave verbal consent to proceed.  History of Present Illness Monique Tyler is a 58 year old female with a history of chronic migraines, anxiety, right shoulder pain status post rotator cuff surgery in 07/2016, seasonal allergies, OSA (uses CPAP at night), migraines who presents with persistent respiratory symptoms.  She has a persistent dry cough and shortness of breath, worse with physical activity and at night, which disrupts sleep. She describes the breathing as hard to breathe without wheezing. CPAP for sleep apnea aggravates the cough but is still needed for apnea episodes.  Prednisone  and an inhaler previously gave temporary relief. She currently uses albuterol  and Qvar  inhalers, and takes Protonix  for reflux and Topamax  for migraines. Prednisone  was effective for the cough but she has not used it recently.   CT angiogram of the chest from 02/2023 revealed: IMPRESSION: 1. No evidence of pulmonary embolism. 2. Interval increase in size of mediastinal and hilar lymphadenopathy as described above. Multiple mildly enlarged upper abdominal lymph nodes as well. Findings are nonspecific and differential diagnosis includes sarcoidosis, malignancy, etc. Further evaluation with PET-CT scan and tissue sampling is recommended.  She has had difficulty obtaining pulmonology follow-up because of insurance issues. She has no fevers or chills. She frequently has to clear her throat but does not bring up mucus.  She also has sharp pain in her feet and knees, described as like a knife going up my feet, especially in the morning. She has tried two foot injections, orthotic soles, and plantar fasciitis  exercises without relief.    Past Medical History:  Diagnosis Date   Asthma    GERD (gastroesophageal reflux disease)    Hyperlipidemia    Hypertension    pt denies a hx of HTN   Kidney stones    Lung nodules    Migraine    UTI due to Klebsiella species 02/26/2023   Vertigo     Past Surgical History:  Procedure Laterality Date   BRONCHIAL NEEDLE ASPIRATION BIOPSY  02/26/2023   Procedure: BRONCHIAL NEEDLE ASPIRATION BIOPSIES;  Surgeon: Kara Dorn NOVAK, MD;  Location: [facility];  Service: Pulmonary;;   BRONCHIAL WASHINGS  02/26/2023   Procedure: BRONCHIAL WASHINGS;  Surgeon: Kara Dorn NOVAK, MD;  Location: [facility];  Service: Pulmonary;;   CESAREAN SECTION     CYSTOSCOPY WITH STENT PLACEMENT Right 07/04/2023   Procedure: CYSTOSCOPY AND RETROGRADE PYELOGRAM;  Surgeon: Cam Morene ORN, MD;  Location: [facility];  Service: Urology;  Laterality: Right;   ENDOBRONCHIAL ULTRASOUND Bilateral 02/26/2023   Procedure: ENDOBRONCHIAL ULTRASOUND;  Surgeon: Kara Dorn NOVAK, MD;  Location: [facility];  Service: Pulmonary;  Laterality: Bilateral;   MANDIBLE SURGERY     TUBAL LIGATION     VIDEO BRONCHOSCOPY  02/26/2023   Procedure: VIDEO BRONCHOSCOPY WITHOUT FLUORO;  Surgeon: Kara Dorn NOVAK, MD;  Location: [facility];  Service: Pulmonary;;    Family History  Problem Relation Age of Onset   Hypertension Daughter    Hypertension Son     Social History   Socioeconomic History   Marital status: Married    Spouse name: Not on file   Number of children: 7   Years of education: Not on file  Highest education level: Not on file  Occupational History   Not on file  Tobacco Use   Smoking status: Former    Types: Cigarettes    Passive exposure: Never   Smokeless tobacco: Never  Vaping Use   Vaping status: Never Used  Substance and Sexual Activity   Alcohol use: No    Alcohol/week: 0.0 standard drinks of alcohol   Drug use: No   Sexual activity: Yes    Birth  control/protection: Surgical  Other Topics Concern   Not on file  Social History Narrative   Not on file   Social Drivers of Health   Tobacco Use: Medium Risk (03/07/2024)   Patient History    Smoking Tobacco Use: Former    Smokeless Tobacco Use: Never    Passive Exposure: Never  Physicist, Medical Strain: Low Risk (10/10/2023)   Overall Financial Resource Strain (CARDIA)    Difficulty of Paying Living Expenses: Not hard at all  Food Insecurity: No Food Insecurity (10/10/2023)   Hunger Vital Sign    Worried About Running Out of Food in the Last Year: Never true    Ran Out of Food in the Last Year: Never true  Transportation Needs: No Transportation Needs (10/10/2023)   PRAPARE - Administrator, Civil Service (Medical): No    Lack of Transportation (Non-Medical): No  Physical Activity: Not on file  Stress: Stress Concern Present (10/10/2023)   [employer] of Occupational Health - Occupational Stress Questionnaire    Feeling of Stress : To some extent  Social Connections: Socially Integrated (10/10/2023)   Social Connection and Isolation Panel    Frequency of Communication with Friends and Family: More than three times a week    Frequency of Social Gatherings with Friends and Family: More than three times a week    Attends Religious Services: More than 4 times per year    Active Member of Clubs or Organizations: Yes    Attends Banker Meetings: More than 4 times per year    Marital Status: Married  Depression (PHQ2-9): Medium Risk (03/06/2024)   Depression (PHQ2-9)    PHQ-2 Score: 5  Alcohol Screen: Low Risk (10/10/2023)   Alcohol Screen    Last Alcohol Screening Score (AUDIT): 0  Housing: Low Risk (10/10/2023)   Housing Stability Vital Sign    Unable to Pay for Housing in the Last Year: No    Number of Times Moved in the Last Year: 0    Homeless in the Last Year: No  Utilities: Not At Risk (10/10/2023)   AHC Utilities    Threatened with loss of  utilities: No  Health Literacy: Adequate Health Literacy (10/10/2023)   B1300 Health Literacy    Frequency of need for help with medical instructions: Never    Allergies[1]  Outpatient Medications Prior to Visit  Medication Sig Dispense Refill   acetaminophen  (TYLENOL ) 500 MG tablet Take 500 mg by mouth every 6 (six) hours as needed.     beclomethasone (QVAR  REDIHALER) 40 MCG/ACT inhaler Inhale 2 puffs into the lungs 2 (two) times daily. 10.6 g 12   hydrOXYzine  (ATARAX ) 25 MG tablet Take 1 tablet (25 mg total) by mouth 3 (three) times daily as needed (Increase night time dose 50 mg). 270 tablet 1   ibuprofen  (ADVIL ) 600 MG tablet Take 1 tablet (600 mg total) by mouth every 6 (six) hours as needed. 30 tablet 0   meloxicam  (MOBIC ) 7.5 MG tablet Take 1 tablet (7.5 mg total)  by mouth daily. 90 tablet 1   methocarbamol  (ROBAXIN ) 500 MG tablet Take 2 tablets (1,000 mg total) by mouth every 8 (eight) hours as needed. 90 tablet 1   albuterol  (VENTOLIN  HFA) 108 (90 Base) MCG/ACT inhaler Inhale 2 puffs into the lungs every 4 (four) hours as needed for wheezing or shortness of breath. 6.7 g 1   amLODipine  (NORVASC ) 5 MG tablet Take 1 tablet (5 mg total) by mouth daily. 90 tablet 0   Ascorbic Acid  (VITAMIN C ) 1000 MG tablet Take 1 tablet (1,000 mg total) by mouth daily. 100 tablet 1   atorvastatin  (LIPITOR) 40 MG tablet Take 1 tablet (40 mg total) by mouth daily. 90 tablet 1   famotidine  (PEPCID ) 20 MG tablet Take 1 tablet (20 mg total) by mouth at bedtime. 90 tablet 1   FLUoxetine  (PROZAC ) 40 MG capsule Take 1 capsule (40 mg total) by mouth daily. 90 capsule 1   fluticasone  (FLONASE ) 50 MCG/ACT nasal spray Place 2 sprays into both nostrils daily. 16 g 1   gabapentin  (NEURONTIN ) 300 MG capsule Take 1 capsule (300 mg total) by mouth at bedtime. 90 capsule 1   pantoprazole  (PROTONIX ) 40 MG tablet Take 1 tablet (40 mg total) by mouth daily. 90 tablet 1   predniSONE  (DELTASONE ) 10 MG tablet Take 4 tablets  daily for 5 days then stop 20 tablet 0   topiramate  (TOPAMAX ) 100 MG tablet Take 1 tablet (100 mg total) by mouth at bedtime. 90 tablet 1   No facility-administered medications prior to visit.     ROS Review of Systems  Constitutional:  Negative for activity change and appetite change.  HENT:  Negative for sinus pressure and sore throat.   Respiratory:  Positive for cough. Negative for chest tightness, shortness of breath and wheezing.   Cardiovascular:  Negative for chest pain and palpitations.  Gastrointestinal:  Negative for abdominal distention, abdominal pain and constipation.  Genitourinary: Negative.   Musculoskeletal:        See HPI  Psychiatric/Behavioral:  Negative for behavioral problems and dysphoric mood.     Objective:  BP 127/85   Pulse 63   Temp 98.4 F (36.9 C) (Oral)   Ht 5' 7 (1.702 m)   Wt 277 lb 12.8 oz (126 kg)   LMP 01/18/2012   SpO2 98%   BMI 43.51 kg/m      07/07/2024    1:37 PM 03/06/2024    1:33 PM 01/10/2024    9:45 AM  BP/Weight  Systolic BP 127 121 119  Diastolic BP 85 82 79  Wt. (Lbs) 277.8 282.4 280.2  BMI 43.51 kg/m2 44.23 kg/m2 43.89 kg/m2      Physical Exam Constitutional:      Appearance: She is well-developed.  Cardiovascular:     Rate and Rhythm: Normal rate.     Heart sounds: Normal heart sounds. No murmur heard. Pulmonary:     Effort: Pulmonary effort is normal.     Breath sounds: Normal breath sounds. No wheezing or rales.  Chest:     Chest wall: No tenderness.  Abdominal:     General: Bowel sounds are normal. There is no distension.     Palpations: Abdomen is soft. There is no mass.     Tenderness: There is no abdominal tenderness.  Musculoskeletal:        General: Normal range of motion.     Right lower leg: No edema.     Left lower leg: No edema.  Neurological:  Mental Status: She is alert and oriented to person, place, and time.  Psychiatric:        Mood and Affect: Mood normal.        Latest Ref  Rng & Units 12/13/2023    1:18 PM 07/04/2023   12:39 AM 02/26/2023    3:52 AM  CMP  Glucose 70 - 99 mg/dL 896  855  889   BUN 6 - 20 mg/dL 12  14  16    Creatinine 0.44 - 1.00 mg/dL 8.88  8.76  8.98   Sodium 135 - 145 mmol/L 142  141  138   Potassium 3.5 - 5.1 mmol/L 3.5  3.5  3.7   Chloride 98 - 111 mmol/L 107  109  105   CO2 22 - 32 mmol/L 21  22  23    Calcium  8.9 - 10.3 mg/dL 9.5  9.6  8.9   Total Protein 6.5 - 8.1 g/dL 6.8  6.8    Total Bilirubin 0.0 - 1.2 mg/dL 0.3  0.5    Alkaline Phos 38 - 126 U/L 184  146    AST 15 - 41 U/L 32  17    ALT 0 - 44 U/L 27  22      Lipid Panel     Component Value Date/Time   CHOL 153 01/10/2024 1042   TRIG 105 01/10/2024 1042   HDL 43 01/10/2024 1042   CHOLHDL 3.7 02/23/2023 0042   VLDL 15 02/23/2023 0042   LDLCALC 91 01/10/2024 1042    CBC    Component Value Date/Time   WBC 5.3 12/13/2023 1318   RBC 4.77 12/13/2023 1318   HGB 12.7 12/13/2023 1318   HGB 12.6 02/15/2023 1005   HCT 41.3 12/13/2023 1318   HCT 40.0 02/15/2023 1005   PLT 298 12/13/2023 1318   PLT 306 02/15/2023 1005   MCV 86.6 12/13/2023 1318   MCV 86 02/15/2023 1005   MCH 26.6 12/13/2023 1318   MCHC 30.8 12/13/2023 1318   RDW 14.5 12/13/2023 1318   RDW 13.4 02/15/2023 1005   LYMPHSABS 1.4 02/15/2023 1005   MONOABS 0.8 06/25/2017 1801   EOSABS 0.1 02/15/2023 1005   BASOSABS 0.0 02/15/2023 1005    Lab Results  Component Value Date   HGBA1C 5.5 10/10/2023       Assessment & Plan Chronic cough with dyspnea Persistent dry cough with dyspnea, particularly at night and exacerbated by CPAP use. CT angiogram showed enlarged lymph nodes, raising suspicion for sarcoidosis. Differential includes sarcoidosis, chronic lung disease, reflux, and sinus-related causes. Current inhalers are not fully effective. -Oxygen saturation is 98% - Prescribed prednisone  20 mg for 5 days. - Prescribed Tessalon  Perles for cough, twice daily as needed. - Renewed prescription for Flonase   and Zyrtec . - Advised to apply for [facility] financial discount for pulmonary referral. - Plan for pulmonary referral once financial discount is approved.  Plantar fasciitis Severe foot pain, exacerbated by walking. -Previously treated with cortisone injections - Orthotics and exercises for plantar fasciitis have been ineffective - Referred to podiatrist for evaluation and potential corticosteroid injections.  Gastroesophageal reflux disease Continues management with Protonix .  Chronic migraine Continues management with Topamax .  Essential hypertension Blood pressure well-controlled with current medication regimen. Counseled on blood pressure goal of less than 130/80, low-sodium, DASH diet, medication compliance, 150 minutes of moderate intensity exercise per week. Discussed medication compliance, adverse effects.   Anxiety disorder Significant anxiety and depression. Has not engaged with a therapist but has an outlet for  stress relief. - Encouraged setting boundaries and maintaining an outlet for stress relief.  Seasonal allergic rhinitis Previously managed with Zyrtec  and Flonase . - Renewed prescription for Zyrtec  and Flonase .   Right knee osteoarthritis With ongoing pain She has been advised she needs surgery Weight loss will be beneficial - Currently on meloxicam    General Health Maintenance Discussed financial assistance options for mammogram and other tests. - Advised to apply for [facility] financial discount for mammogram and other tests. - Administered flu shot. - Will order labs at next visit once she has obtained the [facility] discount to avoid a heavy bill       Meds ordered this encounter  Medications   albuterol  (VENTOLIN  HFA) 108 (90 Base) MCG/ACT inhaler    Sig: Inhale 2 puffs into the lungs every 4 (four) hours as needed for wheezing or shortness of breath.    Dispense:  6.7 g    Refill:  1   amLODipine  (NORVASC ) 5 MG tablet    Sig: Take 1 tablet (5 mg total)  by mouth daily.    Dispense:  90 tablet    Refill:  1   Ascorbic Acid  (VITAMIN C ) 1000 MG tablet    Sig: Take 1 tablet (1,000 mg total) by mouth daily.    Dispense:  100 tablet    Refill:  1   atorvastatin  (LIPITOR) 40 MG tablet    Sig: Take 1 tablet (40 mg total) by mouth daily.    Dispense:  90 tablet    Refill:  1   famotidine  (PEPCID ) 20 MG tablet    Sig: Take 1 tablet (20 mg total) by mouth at bedtime.    Dispense:  90 tablet    Refill:  1   FLUoxetine  (PROZAC ) 40 MG capsule    Sig: Take 1 capsule (40 mg total) by mouth daily.    Dispense:  90 capsule    Refill:  1   gabapentin  (NEURONTIN ) 300 MG capsule    Sig: Take 1 capsule (300 mg total) by mouth at bedtime.    Dispense:  90 capsule    Refill:  1   pantoprazole  (PROTONIX ) 40 MG tablet    Sig: Take 1 tablet (40 mg total) by mouth daily.    Dispense:  90 tablet    Refill:  1   topiramate  (TOPAMAX ) 100 MG tablet    Sig: Take 1 tablet (100 mg total) by mouth at bedtime.    Dispense:  90 tablet    Refill:  1   predniSONE  (DELTASONE ) 20 MG tablet    Sig: Take 1 tablet (20 mg total) by mouth daily with breakfast. Take 4 tablets daily for 5 days then stop    Dispense:  5 tablet    Refill:  0   benzonatate  (TESSALON ) 100 MG capsule    Sig: Take 1 capsule (100 mg total) by mouth 2 (two) times daily as needed for cough.    Dispense:  60 capsule    Refill:  1   fluticasone  (FLONASE ) 50 MCG/ACT nasal spray    Sig: Place 2 sprays into both nostrils daily.    Dispense:  16 g    Refill:  1   cetirizine  (ZYRTEC ) 10 MG tablet    Sig: Take 1 tablet (10 mg total) by mouth daily.    Dispense:  30 tablet    Refill:  1    Follow-up: Return in about 3 months (around 10/05/2024) for Chronic medical conditions, fasting labs.  Corrina Sabin, MD, FAAFP. [facility] [place], [place] [phone]   07/07/2024, 2:19 PM    [1]  Allergies Allergen Reactions   Shellfish Allergy Swelling    Porcine (Pork) Protein-Containing Drug Products Nausea And Vomiting

## 2024-07-14 ENCOUNTER — Ambulatory Visit: Payer: PRIVATE HEALTH INSURANCE | Admitting: Podiatry

## 2024-07-15 ENCOUNTER — Other Ambulatory Visit: Payer: Self-pay

## 2024-08-15 ENCOUNTER — Other Ambulatory Visit: Payer: Self-pay

## 2024-10-06 ENCOUNTER — Ambulatory Visit: Payer: Self-pay | Admitting: Family Medicine

## 2024-10-07 ENCOUNTER — Ambulatory Visit: Payer: Self-pay | Admitting: Family Medicine
